# Patient Record
Sex: Male | Born: 1945
Health system: Southern US, Community
[De-identification: ages and names within clinical notes are randomized; demographics above are authoritative.]

## PROBLEM LIST (undated history)

## (undated) DIAGNOSIS — N529 Male erectile dysfunction, unspecified: Secondary | ICD-10-CM

## (undated) DIAGNOSIS — R351 Nocturia: Secondary | ICD-10-CM

## (undated) DIAGNOSIS — C61 Malignant neoplasm of prostate: Secondary | ICD-10-CM

## (undated) DIAGNOSIS — E785 Hyperlipidemia, unspecified: Secondary | ICD-10-CM

## (undated) DIAGNOSIS — D649 Anemia, unspecified: Secondary | ICD-10-CM

## (undated) DIAGNOSIS — F1020 Alcohol dependence, uncomplicated: Secondary | ICD-10-CM

## (undated) DIAGNOSIS — J45909 Unspecified asthma, uncomplicated: Secondary | ICD-10-CM

## (undated) DIAGNOSIS — M1990 Unspecified osteoarthritis, unspecified site: Secondary | ICD-10-CM

## (undated) DIAGNOSIS — N400 Enlarged prostate without lower urinary tract symptoms: Secondary | ICD-10-CM

## (undated) DIAGNOSIS — I480 Paroxysmal atrial fibrillation: Secondary | ICD-10-CM

## (undated) DIAGNOSIS — F329 Major depressive disorder, single episode, unspecified: Secondary | ICD-10-CM

## (undated) DIAGNOSIS — J449 Chronic obstructive pulmonary disease, unspecified: Secondary | ICD-10-CM

## (undated) DIAGNOSIS — F32A Depression, unspecified: Secondary | ICD-10-CM

## (undated) DIAGNOSIS — H9190 Unspecified hearing loss, unspecified ear: Secondary | ICD-10-CM

## (undated) DIAGNOSIS — I10 Essential (primary) hypertension: Secondary | ICD-10-CM

## (undated) HISTORY — DX: Malignant neoplasm of prostate: C61

## (undated) HISTORY — DX: Unspecified hearing loss, unspecified ear: H91.90

## (undated) HISTORY — DX: Chronic obstructive pulmonary disease, unspecified: J44.9

## (undated) HISTORY — DX: Hyperlipidemia, unspecified: E78.5

## (undated) HISTORY — DX: Paroxysmal atrial fibrillation: I48.0

## (undated) HISTORY — DX: Male erectile dysfunction, unspecified: N52.9

## (undated) HISTORY — DX: Major depressive disorder, single episode, unspecified: F32.9

## (undated) HISTORY — PX: TOTAL HIP ARTHROPLASTY: SHX124

## (undated) HISTORY — DX: Alcohol dependence, uncomplicated: F10.20

## (undated) HISTORY — DX: Essential (primary) hypertension: I10

## (undated) HISTORY — DX: Depression, unspecified: F32.A

---

## 2004-12-14 ENCOUNTER — Ambulatory Visit: Payer: Self-pay | Admitting: *Deleted

## 2004-12-28 ENCOUNTER — Ambulatory Visit (HOSPITAL_COMMUNITY): Admission: RE | Admit: 2004-12-28 | Discharge: 2004-12-28 | Payer: Self-pay | Admitting: *Deleted

## 2004-12-28 ENCOUNTER — Ambulatory Visit: Payer: Self-pay | Admitting: *Deleted

## 2004-12-31 HISTORY — PX: CARDIOVASCULAR STRESS TEST: SHX262

## 2005-01-07 ENCOUNTER — Ambulatory Visit: Payer: Self-pay | Admitting: *Deleted

## 2010-11-25 ENCOUNTER — Encounter: Payer: Self-pay | Admitting: *Deleted

## 2011-05-14 ENCOUNTER — Encounter: Payer: Self-pay | Admitting: Family Medicine

## 2011-05-14 DIAGNOSIS — M109 Gout, unspecified: Secondary | ICD-10-CM | POA: Insufficient documentation

## 2011-05-14 DIAGNOSIS — E785 Hyperlipidemia, unspecified: Secondary | ICD-10-CM | POA: Insufficient documentation

## 2011-05-14 DIAGNOSIS — N529 Male erectile dysfunction, unspecified: Secondary | ICD-10-CM | POA: Insufficient documentation

## 2011-05-14 DIAGNOSIS — F32A Depression, unspecified: Secondary | ICD-10-CM | POA: Insufficient documentation

## 2011-05-14 DIAGNOSIS — H9190 Unspecified hearing loss, unspecified ear: Secondary | ICD-10-CM | POA: Insufficient documentation

## 2011-05-14 DIAGNOSIS — I10 Essential (primary) hypertension: Secondary | ICD-10-CM | POA: Insufficient documentation

## 2011-05-14 DIAGNOSIS — J449 Chronic obstructive pulmonary disease, unspecified: Secondary | ICD-10-CM | POA: Insufficient documentation

## 2011-05-14 DIAGNOSIS — F329 Major depressive disorder, single episode, unspecified: Secondary | ICD-10-CM | POA: Insufficient documentation

## 2011-05-14 DIAGNOSIS — F1020 Alcohol dependence, uncomplicated: Secondary | ICD-10-CM | POA: Insufficient documentation

## 2017-08-15 ENCOUNTER — Other Ambulatory Visit: Payer: Self-pay | Admitting: Urology

## 2017-08-15 ENCOUNTER — Encounter (HOSPITAL_BASED_OUTPATIENT_CLINIC_OR_DEPARTMENT_OTHER): Payer: Self-pay | Admitting: *Deleted

## 2017-08-25 ENCOUNTER — Encounter (HOSPITAL_BASED_OUTPATIENT_CLINIC_OR_DEPARTMENT_OTHER): Admission: RE | Payer: Self-pay | Source: Ambulatory Visit

## 2017-08-25 ENCOUNTER — Ambulatory Visit (HOSPITAL_BASED_OUTPATIENT_CLINIC_OR_DEPARTMENT_OTHER): Admission: RE | Admit: 2017-08-25 | Payer: Medicare HMO | Source: Ambulatory Visit | Admitting: Urology

## 2017-08-25 HISTORY — DX: Benign prostatic hyperplasia without lower urinary tract symptoms: N40.0

## 2017-08-25 HISTORY — DX: Nocturia: R35.1

## 2017-08-25 SURGERY — CYSTOSCOPY WITH INSERTION OF UROLIFT
Anesthesia: Monitor Anesthesia Care

## 2017-11-04 HISTORY — PX: PROSTATECTOMY: SHX69

## 2018-05-27 DIAGNOSIS — R972 Elevated prostate specific antigen [PSA]: Secondary | ICD-10-CM | POA: Insufficient documentation

## 2018-09-23 DIAGNOSIS — C61 Malignant neoplasm of prostate: Secondary | ICD-10-CM | POA: Insufficient documentation

## 2019-01-19 ENCOUNTER — Ambulatory Visit: Payer: Medicare HMO | Admitting: Orthopaedic Surgery

## 2019-01-19 ENCOUNTER — Encounter: Payer: Self-pay | Admitting: Orthopaedic Surgery

## 2019-01-19 ENCOUNTER — Ambulatory Visit (INDEPENDENT_AMBULATORY_CARE_PROVIDER_SITE_OTHER): Payer: Medicare HMO

## 2019-01-19 ENCOUNTER — Other Ambulatory Visit: Payer: Self-pay

## 2019-01-19 VITALS — BP 160/90 | HR 94 | Ht 70.0 in | Wt 139.0 lb

## 2019-01-19 DIAGNOSIS — F17210 Nicotine dependence, cigarettes, uncomplicated: Secondary | ICD-10-CM | POA: Diagnosis not present

## 2019-01-19 DIAGNOSIS — M25552 Pain in left hip: Secondary | ICD-10-CM

## 2019-01-19 NOTE — Patient Instructions (Signed)
Steps to Quit Smoking    Smoking tobacco can be bad for your health. It can also affect almost every organ in your body. Smoking puts you and people around you at risk for many serious long-lasting (chronic) diseases. Quitting smoking is hard, but it is one of the best things that you can do for your health. It is never too late to quit.  What are the benefits of quitting smoking?  When you quit smoking, you lower your risk for getting serious diseases and conditions. They can include:  · Lung cancer or lung disease.  · Heart disease.  · Stroke.  · Heart attack.  · Not being able to have children (infertility).  · Weak bones (osteoporosis) and broken bones (fractures).  If you have coughing, wheezing, and shortness of breath, those symptoms may get better when you quit. You may also get sick less often. If you are pregnant, quitting smoking can help to lower your chances of having a baby of low birth weight.  What can I do to help me quit smoking?  Talk with your doctor about what can help you quit smoking. Some things you can do (strategies) include:  · Quitting smoking totally, instead of slowly cutting back how much you smoke over a period of time.  · Going to in-person counseling. You are more likely to quit if you go to many counseling sessions.  · Using resources and support systems, such as:  ? Online chats with a counselor.  ? Phone quitlines.  ? Printed self-help materials.  ? Support groups or group counseling.  ? Text messaging programs.  ? Mobile phone apps or applications.  · Taking medicines. Some of these medicines may have nicotine in them. If you are pregnant or breastfeeding, do not take any medicines to quit smoking unless your doctor says it is okay. Talk with your doctor about counseling or other things that can help you.  Talk with your doctor about using more than one strategy at the same time, such as taking medicines while you are also going to in-person counseling. This can help make  quitting easier.  What things can I do to make it easier to quit?  Quitting smoking might feel very hard at first, but there is a lot that you can do to make it easier. Take these steps:  · Talk to your family and friends. Ask them to support and encourage you.  · Call phone quitlines, reach out to support groups, or work with a counselor.  · Ask people who smoke to not smoke around you.  · Avoid places that make you want (trigger) to smoke, such as:  ? Bars.  ? Parties.  ? Smoke-break areas at work.  · Spend time with people who do not smoke.  · Lower the stress in your life. Stress can make you want to smoke. Try these things to help your stress:  ? Getting regular exercise.  ? Deep-breathing exercises.  ? Yoga.  ? Meditating.  ? Doing a body scan. To do this, close your eyes, focus on one area of your body at a time from head to toe, and notice which parts of your body are tense. Try to relax the muscles in those areas.  · Download or buy apps on your mobile phone or tablet that can help you stick to your quit plan. There are many free apps, such as QuitGuide from the CDC (Centers for Disease Control and Prevention). You can find more   support from smokefree.gov and other websites.  This information is not intended to replace advice given to you by your health care provider. Make sure you discuss any questions you have with your health care provider.  Document Released: 08/17/2009 Document Revised: 06/18/2016 Document Reviewed: 03/07/2015  Elsevier Interactive Patient Education © 2019 Elsevier Inc.

## 2019-01-19 NOTE — Progress Notes (Signed)
Subjective:    Patient ID: Dan [name], male    DOB: 08/13/1946, 73 y.o.   MRN: [id number]  HPI He fell in his yard on March 13 and hurt his left hip.  He has a bipolar hip done in 1992 by Dr. Alphonzo Cruise.  He has had no problem with the hip over the years.  His left hip is better but still hurts.  He uses a cane.  He has taken Tylenol.  He is concerned he may have injured the hip.     Review of Systems  Constitutional: Positive for activity change.  HENT: Positive for hearing loss.   Respiratory: Positive for shortness of breath. Negative for cough.   Musculoskeletal: Positive for arthralgias and gait problem.  All other systems reviewed and are negative.  For Review of Systems, all other systems reviewed and are negative.  The following is a summary of the past history medically, past history surgically, known current medicines, social history and family history.  This information is gathered electronically by the computer from prior information and documentation.  I review this each visit and have found including this information at this point in the chart is beneficial and informative.   Past Medical History:  Diagnosis Date  . Alcohol dependency ([place])   . BPH (benign prostatic hyperplasia)   . COPD (chronic obstructive pulmonary disease) ([place])   . Depression   . ED (erectile dysfunction)   . Gout   . Hyperlipidemia   . Hypertension   . Loss of hearing   . Nocturia     Past Surgical History:  Procedure Laterality Date  . CARDIOVASCULAR STRESS TEST  12/31/2004   normal perfuction nuclear study w/ no evidence ischemia /  normal LV function and wall motion , ef 52%  . PROSTATECTOMY  2019  . TOTAL HIP ARTHROPLASTY      Current Outpatient Medications on File Prior to Visit  Medication Sig Dispense Refill  . albuterol (PROVENTIL HFA;VENTOLIN HFA) 108 (90 Base) MCG/ACT inhaler Inhale 2 puffs into the lungs every 6 (six) hours as needed for wheezing or shortness of breath.     . diltiazem (CARDIZEM CD) 240 MG 24 hr capsule Take 240 mg by mouth daily.      . hydrochlorothiazide (,MICROZIDE/HYDRODIURIL,) 12.5 MG capsule Take 12.5 mg by mouth daily.      Marland Kitchen albuterol (PROVENTIL) (2.5 MG/3ML) 0.083% nebulizer solution Take 2.5 mg by nebulization every 6 (six) hours as needed.      Marland Kitchen allopurinol (ZYLOPRIM) 300 MG tablet Take 300 mg by mouth daily.      Marland Kitchen buPROPion (WELLBUTRIN XL) 150 MG 24 hr tablet Take 150 mg by mouth daily.      . diclofenac-misoprostol (ARTHROTEC 75) 75-0.2 MG per tablet Take 1 tablet by mouth 2 (two) times daily.      . montelukast (SINGULAIR) 10 MG tablet Take 10 mg by mouth at bedtime.      Marland Kitchen omega-3 acid ethyl esters (LOVAZA) 1 G capsule Take 2 g by mouth 2 (two) times daily.      Marland Kitchen omeprazole (PRILOSEC) 40 MG capsule Take 40 mg by mouth daily.       No current facility-administered medications on file prior to visit.     Social History   Socioeconomic History  . Marital status: Single    Spouse name: Not on file  . Number of children: Not on file  . Years of education: Not on file  . Highest education level:  Not on file  Occupational History  . Not on file  Social Needs  . Financial resource strain: Not on file  . Food insecurity:    Worry: Not on file    Inability: Not on file  . Transportation needs:    Medical: Not on file    Non-medical: Not on file  Tobacco Use  . Smoking status: Current Every Day Smoker  . Smokeless tobacco: Never Used  Substance and Sexual Activity  . Alcohol use: Not on file  . Drug use: Not on file  . Sexual activity: Not on file  Lifestyle  . Physical activity:    Days per week: Not on file    Minutes per session: Not on file  . Stress: Not on file  Relationships  . Social connections:    Talks on phone: Not on file    Gets together: Not on file    Attends religious service: Not on file    Active member of club or organization: Not on file    Attends meetings of clubs or organizations: Not on  file    Relationship status: Not on file  . Intimate partner violence:    Fear of current or ex partner: Not on file    Emotionally abused: Not on file    Physically abused: Not on file    Forced sexual activity: Not on file  Other Topics Concern  . Not on file  Social History Narrative  . Not on file    Family History  Problem Relation Age of Onset  . Hypertension Mother   . Cancer Father        lung    BP (!) 160/90   Pulse 94   Ht 5\' 10"  (1.778 m)   Wt 139 lb (63 kg)   BMI 19.94 kg/m   Body mass index is 19.94 kg/m.     Objective:   Physical Exam Constitutional:      Appearance: He is well-developed.  HENT:     Head: Normocephalic and atraumatic.  Eyes:     Conjunctiva/sclera: Conjunctivae normal.     Pupils: Pupils are equal, round, and reactive to light.  Neck:     Musculoskeletal: Normal range of motion and neck supple.  Cardiovascular:     Rate and Rhythm: Normal rate and regular rhythm.  Pulmonary:     Effort: Pulmonary effort is normal.  Abdominal:     Palpations: Abdomen is soft.  Musculoskeletal:     Left hip: He exhibits decreased range of motion and tenderness.       Legs:  Skin:    General: Skin is warm and dry.  Neurological:     Mental Status: He is alert and oriented to person, place, and time.     Cranial Nerves: No cranial nerve deficit.     Motor: No abnormal muscle tone.     Coordination: Coordination normal.     Deep Tendon Reflexes: Reflexes are normal and symmetric. Reflexes normal.  Psychiatric:        Behavior: Behavior normal.        Thought Content: Thought content normal.        Judgment: Judgment normal.     X-rays were done of the left hip, reported separately.  Bipolar hip left no new fracture.      Assessment & Plan:   Encounter Diagnoses  Name Primary?  . Pain of left hip joint Yes  . Cigarette nicotine dependence without complication  He has no fracture of the hip or around the prosthesis.  I have  recommended Aleve one bid, ice or heat as needed.  Return as needed.  If it gets worse, call.  Electronically Signed Sanjuana Kava, MD 3/17/202010:31 AM

## 2019-07-02 DIAGNOSIS — F17210 Nicotine dependence, cigarettes, uncomplicated: Secondary | ICD-10-CM | POA: Insufficient documentation

## 2019-09-20 DIAGNOSIS — N3090 Cystitis, unspecified without hematuria: Secondary | ICD-10-CM | POA: Insufficient documentation

## 2019-12-15 DIAGNOSIS — I739 Peripheral vascular disease, unspecified: Secondary | ICD-10-CM | POA: Insufficient documentation

## 2019-12-15 DIAGNOSIS — R002 Palpitations: Secondary | ICD-10-CM | POA: Insufficient documentation

## 2019-12-15 DIAGNOSIS — I4891 Unspecified atrial fibrillation: Secondary | ICD-10-CM | POA: Insufficient documentation

## 2020-01-04 ENCOUNTER — Other Ambulatory Visit: Payer: Self-pay

## 2020-01-04 ENCOUNTER — Encounter: Payer: Self-pay | Admitting: *Deleted

## 2020-01-04 ENCOUNTER — Encounter: Payer: Self-pay | Admitting: Cardiovascular Disease

## 2020-01-04 ENCOUNTER — Ambulatory Visit: Payer: Medicare HMO | Admitting: Cardiovascular Disease

## 2020-01-04 VITALS — BP 177/90 | HR 92 | Ht 69.0 in | Wt 133.2 lb

## 2020-01-04 DIAGNOSIS — R0600 Dyspnea, unspecified: Secondary | ICD-10-CM

## 2020-01-04 DIAGNOSIS — R5383 Other fatigue: Secondary | ICD-10-CM | POA: Diagnosis not present

## 2020-01-04 DIAGNOSIS — I10 Essential (primary) hypertension: Secondary | ICD-10-CM

## 2020-01-04 DIAGNOSIS — Z01812 Encounter for preprocedural laboratory examination: Secondary | ICD-10-CM

## 2020-01-04 DIAGNOSIS — I499 Cardiac arrhythmia, unspecified: Secondary | ICD-10-CM

## 2020-01-04 DIAGNOSIS — R0609 Other forms of dyspnea: Secondary | ICD-10-CM

## 2020-01-04 DIAGNOSIS — Z720 Tobacco use: Secondary | ICD-10-CM | POA: Diagnosis not present

## 2020-01-04 DIAGNOSIS — R002 Palpitations: Secondary | ICD-10-CM

## 2020-01-04 MED ORDER — METOPROLOL TARTRATE 100 MG PO TABS: 100.0000 mg | ORAL_TABLET | Freq: Once | ORAL | 0 refills | Status: DC

## 2020-01-04 NOTE — Addendum Note (Signed)
Addended by: Laurine Blazer on: 01/04/2020 11:26 AM   Modules accepted: Orders

## 2020-01-04 NOTE — Patient Instructions (Addendum)
Medication Instructions:  Continue all current medications.  Labwork:  BMET - order given today.   Please do just prior to CT.  Testing/Procedures:  Your physician has recommended that you wear a 30 day event monitor. Event monitors are medical devices that record the heart's electrical activity. Doctors most often [place] these monitors to diagnose arrhythmias. Arrhythmias are problems with the speed or rhythm of the heartbeat. The monitor is a small, portable device. You can wear one while you do your normal daily activities. This is usually used to diagnose what is causing palpitations/syncope (passing out).  Your physician has requested that you have an echocardiogram. Echocardiography is a painless test that uses sound waves to create images of your heart. It provides your doctor with information about the size and shape of your heart and how well your heart's chambers and valves are working. This procedure takes approximately one hour. There are no restrictions for this procedure.  Your physician has requested that you have cardiac CT. Cardiac computed tomography (CT) is a painless test that uses an x-ray machine to take clear, detailed pictures of your heart. For further information please visit HugeFiesta.tn. Please follow instruction sheet as given.  Office will contact with results via phone or letter.    Follow-Up: 3 months   Any Other Special Instructions Will Be Listed Below (If Applicable).  If you need a refill on your cardiac medications before your next appointment, please call your pharmacy.

## 2020-01-04 NOTE — Progress Notes (Addendum)
CARDIOLOGY CONSULT NOTE  Patient ID: Dan [name] MRN: [id number] DOB/AGE: 12-23-1945 74 y.o.  Admit date: (Not on file) Primary Physician: Caryl Bis, MD  Reason for Consultation: Atrial fibrillation  HPI: Dan [name] is a 74 y.o. male who is being seen today for the evaluation of atrial fibrillation at the request of Caryl Bis, MD.   I reviewed notes from his PCP.  I personally reviewed an ECG performed on 12/15/2019.  There was a lot of baseline artifact.  I was able to appreciate some sinus beats along with PVCs.  There appeared to be some PACs as well.  I reviewed labs dated 10/19/2019: Hemoglobin 11.6, platelets 373.  PCP notes mention a history of COPD and palpitations which appear to be chronic.  He has no history of chest pain.  I personally reviewed the ECG performed the office today which demonstrates sinus rhythm with frequent PACs and brief atrial runs.  Upon speaking with him, he tells me that he has had palpitations for about 3 to 4 months.  He has also been experiencing some exertional fatigue over the last 3 to 4 months.  He denies exertional chest pain.  After carrying groceries up the stairs to his house he has to sit down because he feels tired out.  He denies leg swelling, orthopnea, and syncope.  Palpitations have "leveled out "according to him since he started diltiazem.  He has been on diltiazem and Eliquis since his last office visit with his PCP.  He said he snores but does not have a known history of sleep apnea.  He wakes up with a dry mouth.  He denies daytime somnolence.  Family history: 1 brother underwent CABG.  Another brother has coronary artery stents.   Allergies  Allergen Reactions  . Tamsulosin Hives and Itching    Current Outpatient Medications  Medication Sig Dispense Refill  . ADVAIR DISKUS 250-50 MCG/DOSE AEPB Inhale 1 puff into the lungs daily.    Marland Kitchen albuterol (PROVENTIL HFA;VENTOLIN HFA) 108 (90 Base) MCG/ACT  inhaler Inhale 2 puffs into the lungs every 6 (six) hours as needed for wheezing or shortness of breath.    Marland Kitchen albuterol (PROVENTIL) (2.5 MG/3ML) 0.083% nebulizer solution Take 2.5 mg by nebulization every 6 (six) hours as needed.      . colchicine 0.6 MG tablet Take 1 tablet by mouth daily as needed.    . diltiazem (TIAZAC) 360 MG 24 hr capsule Take 360 mg by mouth daily.    Marland Kitchen ELIQUIS 5 MG TABS tablet Take 5 mg by mouth 2 (two) times daily.    . hydrochlorothiazide (,MICROZIDE/HYDRODIURIL,) 12.5 MG capsule Take 12.5 mg by mouth daily.       No current facility-administered medications for this visit.    Past Medical History:  Diagnosis Date  . Alcohol dependency ([place])   . BPH (benign prostatic hyperplasia)   . COPD (chronic obstructive pulmonary disease) ([place])   . Depression   . ED (erectile dysfunction)   . Gout   . Hyperlipidemia   . Hypertension   . Loss of hearing   . Nocturia     Past Surgical History:  Procedure Laterality Date  . CARDIOVASCULAR STRESS TEST  12/31/2004   normal perfuction nuclear study w/ no evidence ischemia /  normal LV function and wall motion , ef 52%  . PROSTATECTOMY  2019  . TOTAL HIP ARTHROPLASTY      Social History   Socioeconomic History  .  Marital status: Single    Spouse name: Not on file  . Number of children: Not on file  . Years of education: Not on file  . Highest education level: Not on file  Occupational History  . Not on file  Tobacco Use  . Smoking status: Current Every Day Smoker    Types: Cigarettes    Start date: 06/12/1967  . Smokeless tobacco: Never Used  Substance and Sexual Activity  . Alcohol use: Not on file  . Drug use: Not on file  . Sexual activity: Not on file  Other Topics Concern  . Not on file  Social History Narrative  . Not on file   Social Determinants of Health   Financial Resource Strain:   . Difficulty of Paying Living Expenses: Not on file  Food Insecurity:   . Worried About Charity fundraiser  in the Last Year: Not on file  . Ran Out of Food in the Last Year: Not on file  Transportation Needs:   . Lack of Transportation (Medical): Not on file  . Lack of Transportation (Non-Medical): Not on file  Physical Activity:   . Days of Exercise per Week: Not on file  . Minutes of Exercise per Session: Not on file  Stress:   . Feeling of Stress : Not on file  Social Connections:   . Frequency of Communication with Friends and Family: Not on file  . Frequency of Social Gatherings with Friends and Family: Not on file  . Attends Religious Services: Not on file  . Active Member of Clubs or Organizations: Not on file  . Attends Archivist Meetings: Not on file  . Marital Status: Not on file  Intimate Partner Violence:   . Fear of Current or Ex-Partner: Not on file  . Emotionally Abused: Not on file  . Physically Abused: Not on file  . Sexually Abused: Not on file      Current Meds  Medication Sig  . ADVAIR DISKUS 250-50 MCG/DOSE AEPB Inhale 1 puff into the lungs daily.  Marland Kitchen albuterol (PROVENTIL HFA;VENTOLIN HFA) 108 (90 Base) MCG/ACT inhaler Inhale 2 puffs into the lungs every 6 (six) hours as needed for wheezing or shortness of breath.  Marland Kitchen albuterol (PROVENTIL) (2.5 MG/3ML) 0.083% nebulizer solution Take 2.5 mg by nebulization every 6 (six) hours as needed.    . colchicine 0.6 MG tablet Take 1 tablet by mouth daily as needed.  . diltiazem (TIAZAC) 360 MG 24 hr capsule Take 360 mg by mouth daily.  Marland Kitchen ELIQUIS 5 MG TABS tablet Take 5 mg by mouth 2 (two) times daily.  . hydrochlorothiazide (,MICROZIDE/HYDRODIURIL,) 12.5 MG capsule Take 12.5 mg by mouth daily.        Review of systems complete and found to be negative unless listed above in HPI    Physical exam Blood pressure (!) 177/90, pulse 92, height 5\' 9"  (1.753 m), weight 133 lb 3.2 oz (60.4 kg), SpO2 99 %. General: NAD Neck: No JVD, no thyromegaly or thyroid nodule.  Lungs: Diffusely diminished breath sounds, no  crackles or wheezes. CV: Nondisplaced PMI. Regular rate and irregular rhythm with frequent premature contractions, normal S1/S2, no S3/S4, no murmur.  No peripheral edema.  No carotid bruit.    Abdomen: Soft, nontender, no distention.  Skin: Intact without lesions or rashes.  Neurologic: Alert and oriented x 3.  Psych: Normal affect. Extremities: No clubbing or cyanosis.  HEENT: Normal.   ECG: Most recent ECG reviewed.   Labs:  No results found for: K, BUN, CREATININE, ALT, TSH, HGB   Lipids: No results found for: LDLCALC, LDLDIRECT, CHOL, TRIG, HDL      ASSESSMENT AND PLAN:   1.  Arrhythmia/palpitations: ECG from PCPs office has a lot of baseline artifact but appears to demonstrate sinus rhythm with PACs and PVCs.  I cannot definitively say there is evidence of atrial fibrillation.  Today's ECG also shows sinus rhythm with frequent PACs and brief atrial runs but again, no definitive evidence of atrial fibrillation.  He is currently on extended release diltiazem 360 mg daily along with Eliquis 5 mg twice daily as prescribed by his PCP.  I will not stop apixaban until I have ruled out atrial fibrillation.  He is certainly at risk for given his atrial ectopy burden. In order to clarify whether or not he has atrial fibrillation, I will obtain a 30-day event monitor.  I will also obtain an echocardiogram to evaluate cardiac structure and function.  2.  Hypertension: Blood pressure is significantly elevated.  He said while it stays high at times, he was very anxious about today's visit.  This will need further monitoring.  3.  Exertional fatigue: He has a family history of heart disease.  He has a long history of tobacco use.  I will proceed with coronary CT angiography.  4.  Tobacco use: He smokes about 5 cigarettes daily.  He smoked up to 2 packs daily in his 82s and 67s.  We talked about cessation.     Disposition: Follow up in 3 months  Signed: Kate Sable, M.D.,  F.A.C.C.  01/04/2020, 10:51 AM

## 2020-01-05 ENCOUNTER — Other Ambulatory Visit: Payer: Self-pay | Admitting: Cardiovascular Disease

## 2020-01-05 DIAGNOSIS — I499 Cardiac arrhythmia, unspecified: Secondary | ICD-10-CM

## 2020-01-17 ENCOUNTER — Ambulatory Visit (INDEPENDENT_AMBULATORY_CARE_PROVIDER_SITE_OTHER): Payer: Medicare HMO

## 2020-01-17 DIAGNOSIS — R002 Palpitations: Secondary | ICD-10-CM

## 2020-01-24 LAB — BASIC METABOLIC PANEL
BUN: 24 mg/dL (ref 7–25)
CO2: 28 mmol/L (ref 20–32)
Calcium: 9.3 mg/dL (ref 8.6–10.3)
Chloride: 94 mmol/L — ABNORMAL LOW (ref 98–110)
Creat: 1.11 mg/dL (ref 0.70–1.18)
Glucose, Bld: 112 mg/dL (ref 65–139)
Potassium: 3.2 mmol/L — ABNORMAL LOW (ref 3.5–5.3)
Sodium: 135 mmol/L (ref 135–146)

## 2020-01-25 ENCOUNTER — Telehealth: Payer: Self-pay | Admitting: *Deleted

## 2020-01-25 DIAGNOSIS — E876 Hypokalemia: Secondary | ICD-10-CM

## 2020-01-25 DIAGNOSIS — R002 Palpitations: Secondary | ICD-10-CM

## 2020-01-25 MED ORDER — POTASSIUM CHLORIDE ER 10 MEQ PO CPCR: 20.0000 meq | ORAL_CAPSULE | Freq: Every day | ORAL | 6 refills | Status: DC

## 2020-01-25 NOTE — Telephone Encounter (Signed)
-----   Message from Herminio Commons, MD sent at 01/24/2020  3:52 PM EDT ----- Potassium is low which can exacerbate palpitations.  He is on hydrochlorothiazide which is likely the cause of this.  Start potassium chloride 20 mEq daily and repeat a basic metabolic panel 1 week after starting supplemental potassium.

## 2020-01-25 NOTE — Telephone Encounter (Signed)
Dan [name], [place]  [id number] PM EDT    Patient & wife notified. New prescription sent to [facility]. He will repeat BMET on 02/02/2020 or 02/03/2020 at [place] across from [facility]    Will send in 52meq capsules at wife request as he has difficulty swallowing large pills.

## 2020-01-27 ENCOUNTER — Ambulatory Visit (INDEPENDENT_AMBULATORY_CARE_PROVIDER_SITE_OTHER): Payer: Medicare HMO

## 2020-01-27 ENCOUNTER — Other Ambulatory Visit: Payer: Self-pay

## 2020-01-27 DIAGNOSIS — I499 Cardiac arrhythmia, unspecified: Secondary | ICD-10-CM

## 2020-01-27 DIAGNOSIS — I10 Essential (primary) hypertension: Secondary | ICD-10-CM

## 2020-01-31 ENCOUNTER — Telehealth: Payer: Self-pay | Admitting: *Deleted

## 2020-01-31 NOTE — Telephone Encounter (Signed)
-----   Message from Herminio Commons, MD sent at 01/27/2020  4:13 PM EDT ----- Normal pumping function.  Mild to moderate mitral valve leakage.  I will monitor.

## 2020-01-31 NOTE — Telephone Encounter (Signed)
The patient verbally consented for a telehealth phone visit with [facility] and understands that his/her insurance company will be billed for the encounter.  

## 2020-01-31 NOTE — Telephone Encounter (Signed)
Laurine Blazer, [place]  [id number] PM EDT    Patient & wife notified. Copy to pmd. Follow up scheduled for 05/02/2020 - vv with Dr. Bronson Ing.

## 2020-02-05 ENCOUNTER — Telehealth: Payer: Self-pay | Admitting: Internal Medicine

## 2020-02-05 NOTE — Telephone Encounter (Signed)
Received a call from [place] for a patient triggered event. Rhythm was sinus with a rate of ~80bpm. [employer] tried to reach the patient and was unsuccessful. I have also tried to call the patient multiple times but have been unable to get a hold of him and his voicemail box is not set up. Will update Dr. Bronson Ing.   Alric Quan, MD Cardiology [place]

## 2020-02-08 ENCOUNTER — Telehealth (HOSPITAL_COMMUNITY): Payer: Self-pay | Admitting: Emergency Medicine

## 2020-02-08 NOTE — Telephone Encounter (Addendum)
Call placed to patient to follow up on [employer] monitor.  Patient stated that the patch that was in the center of his chest was not sticking - could not get it to stay on.  He called [employer] & they mailed him some different patches & monitoring is going good now.  No c/o syncope at any time.

## 2020-02-08 NOTE — Telephone Encounter (Signed)
Attempted to call patient regarding upcoming cardiac CT appointment. °Left message on voicemail with name and callback number °Catelyn Friel RN Navigator Cardiac Imaging °[facility] °[phone] Office °[phone] Cell ° °

## 2020-02-09 ENCOUNTER — Telehealth: Payer: Self-pay | Admitting: Cardiovascular Disease

## 2020-02-09 ENCOUNTER — Ambulatory Visit (HOSPITAL_COMMUNITY)
Admission: RE | Admit: 2020-02-09 | Discharge: 2020-02-09 | Disposition: A | Discharge status: Home / Self Care | Payer: Medicare HMO | Source: Ambulatory Visit | Attending: Cardiovascular Disease | Admitting: Cardiovascular Disease

## 2020-02-09 ENCOUNTER — Other Ambulatory Visit: Payer: Self-pay

## 2020-02-09 ENCOUNTER — Encounter: Payer: Self-pay | Admitting: *Deleted

## 2020-02-09 ENCOUNTER — Encounter (HOSPITAL_COMMUNITY): Payer: Self-pay

## 2020-02-09 DIAGNOSIS — R5383 Other fatigue: Secondary | ICD-10-CM

## 2020-02-09 DIAGNOSIS — R0600 Dyspnea, unspecified: Secondary | ICD-10-CM

## 2020-02-09 DIAGNOSIS — R0609 Other forms of dyspnea: Secondary | ICD-10-CM

## 2020-02-09 NOTE — Telephone Encounter (Signed)
  Precert needed for: Lexiscan   Location: [facility]    Date: February 17, 2020

## 2020-02-09 NOTE — Progress Notes (Signed)
Dan [name] presents for CT heart study as ordered. He currently is in A.Fib with rates ranging from 88-96 bpm after 100 mg of Metoprolol and 360 mg of Cardizem. He reports shortness of breath with ambulation. Vitals are stable at this time. CT tech notified of rate and will contact Cardiology.

## 2020-02-09 NOTE — Telephone Encounter (Signed)
I sent a message to Dan [name] about ordering a Lexiscan.

## 2020-02-09 NOTE — Telephone Encounter (Signed)
Patient's wife called. Patient went to [facility] to have test done and was told that he was in AFIB and they could not perform the test and to contact Dr Bronson Ing

## 2020-02-09 NOTE — Telephone Encounter (Signed)
Patient informed and verbalized understanding. Lexiscan instructions read and mailed to patient.

## 2020-02-10 ENCOUNTER — Telehealth: Payer: Self-pay | Admitting: Cardiovascular Disease

## 2020-02-10 NOTE — Telephone Encounter (Signed)
I agree as that is the whole purpose of the stress test.  Please reassure him that this is a very common cardiac investigation.

## 2020-02-10 NOTE — Telephone Encounter (Signed)
Spoke with patient who says he doesn't think he can have the stress test because of his SOB. Advised that because his cardiac ct was canceled d/t A-fib, the stress test was ordered to evaluate his heart while under stress to see if his DOE is caused by his heart. Patient says he is concerned that he may have a heart attack during the stress test. Advised that he will be monitored closely during the stress test for any issues that may arise. Advised that his provider would be contacted about his concerns.

## 2020-02-10 NOTE — Telephone Encounter (Signed)
Patient's wife called stating that patient will not be able to do a stress test due to his shortness of breath.

## 2020-02-17 ENCOUNTER — Encounter (HOSPITAL_COMMUNITY): Payer: Medicare HMO

## 2020-02-17 ENCOUNTER — Telehealth: Payer: Self-pay | Admitting: Cardiovascular Disease

## 2020-02-17 ENCOUNTER — Ambulatory Visit (HOSPITAL_COMMUNITY): Payer: Medicare HMO

## 2020-02-17 NOTE — Telephone Encounter (Signed)
Patient's wife called asking for someone to call them back about his SOB.  He lexiscan had to be rescheduled due to their insurance not authorizing.   [employer] is working on trying to get authorized.

## 2020-02-17 NOTE — Telephone Encounter (Signed)
Spoke with wife who says patient continues to be out of breath after walking to the mail box. Advised wife that if SOB has worsened that he can go to the ED for an evaluation. Verbalized understanding of plan.

## 2020-02-18 ENCOUNTER — Telehealth: Payer: Self-pay | Admitting: Cardiovascular Disease

## 2020-02-18 NOTE — Telephone Encounter (Signed)
Fwd to provider for notification.  

## 2020-02-18 NOTE — Telephone Encounter (Signed)
For "DOE"?

## 2020-02-18 NOTE — Telephone Encounter (Signed)
New Message    Anderson Malta from [employer] is calling to inform the stress test is denied   Please advise

## 2020-02-22 NOTE — Telephone Encounter (Signed)
Pt wife called back in regards to stress test and aware that insurance denied coverage - also asking for results of 30 day monitor and aware that EOS was not available and would forward to covering nurse for f/u on monitor results

## 2020-02-23 ENCOUNTER — Telehealth: Payer: Self-pay | Admitting: Cardiovascular Disease

## 2020-02-23 NOTE — Telephone Encounter (Signed)
   Went to pt's chart to check note. A rep from [employer] is calling about appeal for stress test, transferred call to [facility]

## 2020-02-28 ENCOUNTER — Telehealth: Payer: Self-pay | Admitting: Cardiovascular Disease

## 2020-02-28 NOTE — Telephone Encounter (Signed)
Patient called stating that he has not received instructions on his upcoming stress test. Please call [phone].

## 2020-02-28 NOTE — Telephone Encounter (Signed)
Advised that stress test instructions were mailed on 02/09/2020. Advised to come by office to pick up copy. Verbalized understanding.

## 2020-03-01 ENCOUNTER — Ambulatory Visit (HOSPITAL_COMMUNITY)
Admission: RE | Admit: 2020-03-01 | Discharge: 2020-03-01 | Disposition: A | Discharge status: Home / Self Care | Payer: Medicare HMO | Source: Ambulatory Visit | Attending: Cardiovascular Disease | Admitting: Cardiovascular Disease

## 2020-03-01 ENCOUNTER — Encounter (HOSPITAL_COMMUNITY)
Admission: RE | Admit: 2020-03-01 | Discharge: 2020-03-01 | Disposition: A | Discharge status: Home / Self Care | Payer: Medicare HMO | Source: Ambulatory Visit | Attending: Cardiovascular Disease | Admitting: Cardiovascular Disease

## 2020-03-01 ENCOUNTER — Other Ambulatory Visit: Payer: Self-pay

## 2020-03-01 DIAGNOSIS — R0609 Other forms of dyspnea: Secondary | ICD-10-CM

## 2020-03-01 DIAGNOSIS — R0600 Dyspnea, unspecified: Secondary | ICD-10-CM | POA: Diagnosis present

## 2020-03-01 LAB — NM MYOCAR MULTI W/SPECT W/WALL MOTION / EF
LV dias vol: 69 mL (ref 62–150)
LV sys vol: 37 mL
Peak HR: 122 {beats}/min
RATE: 0.29
Rest HR: 103 {beats}/min
SDS: 1
SRS: 4
SSS: 5
TID: 1.09

## 2020-03-01 MED ORDER — SODIUM CHLORIDE FLUSH 0.9 % IV SOLN
INTRAVENOUS | Status: AC
  Administered 2020-03-01: 10 mL via INTRAVENOUS
  Filled 2020-03-01: qty 10

## 2020-03-01 MED ORDER — TECHNETIUM TC 99M TETROFOSMIN IV KIT
30.0000 | PACK | Freq: Once | INTRAVENOUS | Status: AC | PRN
  Administered 2020-03-01: 12:00:00 32 via INTRAVENOUS

## 2020-03-01 MED ORDER — TECHNETIUM TC 99M TETROFOSMIN IV KIT
10.0000 | PACK | Freq: Once | INTRAVENOUS | Status: AC | PRN
  Administered 2020-03-01: 10.4 via INTRAVENOUS

## 2020-03-01 MED ORDER — REGADENOSON 0.4 MG/5ML IV SOLN
INTRAVENOUS | Status: AC
  Administered 2020-03-01: 11:00:00 0.4 mg via INTRAVENOUS
  Filled 2020-03-01: qty 5

## 2020-03-06 ENCOUNTER — Telehealth: Payer: Self-pay | Admitting: Cardiovascular Disease

## 2020-03-06 DIAGNOSIS — R002 Palpitations: Secondary | ICD-10-CM

## 2020-03-06 NOTE — Telephone Encounter (Signed)
Gina-wife called stating that she thinks patient is taking too much Eliquis.States that his arm is covered with blood spots.  [phone].

## 2020-03-06 NOTE — Telephone Encounter (Signed)
Pt wife voiced understanding - they will be back in town on Friday and will have labs done at Dr Olena Heckle office - lab orders mailed to pt as requested

## 2020-03-06 NOTE — Telephone Encounter (Signed)
Can check a CBC.  Decreasing the dosage will fail to prevent a stroke.

## 2020-03-06 NOTE — Telephone Encounter (Signed)
Pt wife says since starting Eliquis last month noticing increased bruising on arms and legs and leg weakness - also c/o face remaining pale looking - denies any bleeding - pt wife wanted to know if he could decrease to 2.5 mg bid

## 2020-03-09 ENCOUNTER — Telehealth: Payer: Self-pay | Admitting: *Deleted

## 2020-03-09 DIAGNOSIS — I5032 Chronic diastolic (congestive) heart failure: Secondary | ICD-10-CM | POA: Insufficient documentation

## 2020-03-09 NOTE — Telephone Encounter (Signed)
-----   Message from Herminio Commons, MD sent at 03/03/2020  3:21 PM EDT ----- Sinus rhythm with frequent PACs and rare PVCs with paroxysms of possible atrial fibrillation

## 2020-03-09 NOTE — Telephone Encounter (Signed)
Dan [name], [place]  [id number] 624THL PM EDT    Left message to return call.

## 2020-03-10 ENCOUNTER — Other Ambulatory Visit: Payer: Self-pay | Admitting: *Deleted

## 2020-03-10 NOTE — Telephone Encounter (Signed)
Dan [name], [place]  [id number] 624THL PM EDT    Wife Barnett Applebaum) notified. Copy to pcp.

## 2020-03-14 ENCOUNTER — Telehealth: Payer: Self-pay | Admitting: *Deleted

## 2020-03-14 ENCOUNTER — Ambulatory Visit (INDEPENDENT_AMBULATORY_CARE_PROVIDER_SITE_OTHER): Payer: Medicare HMO | Admitting: Orthopaedic Surgery

## 2020-03-14 ENCOUNTER — Ambulatory Visit: Payer: Medicare HMO

## 2020-03-14 ENCOUNTER — Encounter: Payer: Self-pay | Admitting: Orthopaedic Surgery

## 2020-03-14 ENCOUNTER — Other Ambulatory Visit: Payer: Self-pay

## 2020-03-14 VITALS — BP 151/79 | HR 101 | Ht 69.0 in | Wt 133.0 lb

## 2020-03-14 DIAGNOSIS — M25552 Pain in left hip: Secondary | ICD-10-CM

## 2020-03-14 DIAGNOSIS — W19XXXA Unspecified fall, initial encounter: Secondary | ICD-10-CM | POA: Diagnosis not present

## 2020-03-14 DIAGNOSIS — F17210 Nicotine dependence, cigarettes, uncomplicated: Secondary | ICD-10-CM | POA: Diagnosis not present

## 2020-03-14 DIAGNOSIS — M9702XA Periprosthetic fracture around internal prosthetic left hip joint, initial encounter: Secondary | ICD-10-CM

## 2020-03-14 MED ORDER — RIVAROXABAN 20 MG PO TABS: 20.0000 mg | ORAL_TABLET | Freq: Every day | ORAL | Status: DC

## 2020-03-14 MED ORDER — HYDROCODONE-ACETAMINOPHEN 5-325 MG PO TABS: ORAL_TABLET | ORAL | 0 refills | Status: DC

## 2020-03-14 NOTE — Telephone Encounter (Signed)
Laurine Blazer, [place]  [id number] 624THL AM EDT    Patient notified.    Laurine Blazer, [place]  [id number] 624THL PM EDT    Wife Barnett Applebaum) notified. States that none of the statins were ever effective for him & caused his brother to have memory issues. States that he absolutely will not take the Crestor. Stated that he had taken Lovaza in the past and is willing to take that again if need to.   Also, pcp has changed his Eliquis to Xarelto 20mg  every evening due to swelling in legs & excessive bruising - will be doing CBC tomorrow.   Laurine Blazer, [place]  [id number]-[id number] PM EDT    03/09/2020 4:19 PM EDT Left message to return call.   Herminio Commons, MD  03/03/2020 3:14 PM EDT    There is suggestion of previous heart attack but no significant blockages. I would start rosuvastatin 20 mg daily for its pleiotropic effects. Pumping function was normal on echocardiogram which is a more accurate test for assessing this.

## 2020-03-14 NOTE — Patient Instructions (Signed)
Steps to Quit Smoking Smoking tobacco is the leading cause of preventable death. It can affect almost every organ in the body. Smoking puts you and people around you at risk for many serious, long-lasting (chronic) diseases. Quitting smoking can be hard, but it is one of the best things that you can do for your health. It is never too late to quit. How do I get ready to quit? When you decide to quit smoking, make a plan to help you succeed. Before you quit:  Pick a date to quit. Set a date within the next 2 weeks to give you time to prepare.  Write down the reasons why you are quitting. Keep this list in places where you will see it often.  Tell your family, friends, and co-workers that you are quitting. Their support is important.  Talk with your doctor about the choices that may help you quit.  Find out if your health insurance will pay for these treatments.  Know the people, places, things, and activities that make you want to smoke (triggers). Avoid them. What first steps can I take to quit smoking?  Throw away all cigarettes at home, at work, and in your car.  Throw away the things that you use when you smoke, such as ashtrays and lighters.  Clean your car. Make sure to empty the ashtray.  Clean your home, including curtains and carpets. What can I do to help me quit smoking? Talk with your doctor about taking medicines and seeing a counselor at the same time. You are more likely to succeed when you do both.  If you are pregnant or breastfeeding, talk with your doctor about counseling or other ways to quit smoking. Do not take medicine to help you quit smoking unless your doctor tells you to do so. To quit smoking: Quit right away  Quit smoking totally, instead of slowly cutting back on how much you smoke over a period of time.  Go to counseling. You are more likely to quit if you go to counseling sessions regularly. Take medicine You may take medicines to help you quit. Some  medicines need a prescription, and some you can buy over-the-counter. Some medicines may contain a drug called nicotine to replace the nicotine in cigarettes. Medicines may:  Help you to stop having the desire to smoke (cravings).  Help to stop the problems that come when you stop smoking (withdrawal symptoms). Your doctor may ask you to use:  Nicotine patches, gum, or lozenges.  Nicotine inhalers or sprays.  Non-nicotine medicine that is taken by mouth. Find resources Find resources and other ways to help you quit smoking and remain smoke-free after you quit. These resources are most helpful when you use them often. They include:  Online chats with a counselor.  Phone quitlines.  Printed self-help materials.  Support groups or group counseling.  Text messaging programs.  Mobile phone apps. Use apps on your mobile phone or tablet that can help you stick to your quit plan. There are many free apps for mobile phones and tablets as well as websites. Examples include Quit Guide from the CDC and smokefree.gov  What things can I do to make it easier to quit?   Talk to your family and friends. Ask them to support and encourage you.  Call a phone quitline (1-800-QUIT-NOW), reach out to support groups, or work with a counselor.  Ask people who smoke to not smoke around you.  Avoid places that make you want to smoke,   such as: ? Bars. ? Parties. ? Smoke-break areas at work.  Spend time with people who do not smoke.  Lower the stress in your life. Stress can make you want to smoke. Try these things to help your stress: ? Getting regular exercise. ? Doing deep-breathing exercises. ? Doing yoga. ? Meditating. ? Doing a body scan. To do this, close your eyes, focus on one area of your body at a time from head to toe. Notice which parts of your body are tense. Try to relax the muscles in those areas. How will I feel when I quit smoking? Day 1 to 3 weeks Within the first 24 hours,  you may start to have some problems that come from quitting tobacco. These problems are very bad 2-3 days after you quit, but they do not often last for more than 2-3 weeks. You may get these symptoms:  Mood swings.  Feeling restless, nervous, angry, or annoyed.  Trouble concentrating.  Dizziness.  Strong desire for high-sugar foods and nicotine.  Weight gain.  Trouble pooping (constipation).  Feeling like you may vomit (nausea).  Coughing or a sore throat.  Changes in how the medicines that you take for other issues work in your body.  Depression.  Trouble sleeping (insomnia). Week 3 and afterward After the first 2-3 weeks of quitting, you may start to notice more positive results, such as:  Better sense of smell and taste.  Less coughing and sore throat.  Slower heart rate.  Lower blood pressure.  Clearer skin.  Better breathing.  Fewer sick days. Quitting smoking can be hard. Do not give up if you fail the first time. Some people need to try a few times before they succeed. Do your best to stick to your quit plan, and talk with your doctor if you have any questions or concerns. Summary  Smoking tobacco is the leading cause of preventable death. Quitting smoking can be hard, but it is one of the best things that you can do for your health.  When you decide to quit smoking, make a plan to help you succeed.  Quit smoking right away, not slowly over a period of time.  When you start quitting, seek help from your doctor, family, or friends. This information is not intended to replace advice given to you by your health care provider. Make sure you discuss any questions you have with your health care provider. Document Revised: 07/16/2019 Document Reviewed: 01/09/2019 Elsevier Patient Education  2020 Elsevier Inc.  

## 2020-03-14 NOTE — Progress Notes (Signed)
Patient [id number] Dan [name], male DOB:[birth year], 74 y.o. [id number]  Chief Complaint  Patient presents with  . Hip Pain    left hip pain had fall on 03/03/20    HPI  Dan [name] is a 74 y.o. male who fell again April 30th and hurt his left hip again.  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 Alphonzo Cruise.  He fell last March 2020 and then did well.  He has had some bruising of the left knee area with no pain there.  He has no numbness. Pain is in the proximal thigh and mid thigh.  Nothing seems to help. He waited to come in as they had beach trip planned and non-refundable.     Body mass index is 19.64 kg/m.  ROS  Review of Systems  Constitutional: Positive for activity change.  HENT: Positive for hearing loss.   Respiratory: Positive for shortness of breath. Negative for cough.   Musculoskeletal: Positive for arthralgias and gait problem.  All other systems reviewed and are negative.   All other systems reviewed and are negative.  The following is a summary of the past history medically, past history surgically, known current medicines, social history and family history.  This information is gathered electronically by the computer from prior information and documentation.  I review this each visit and have found including this information at this point in the chart is beneficial and informative.    Past Medical History:  Diagnosis Date  . Alcohol dependency ([place])   . BPH (benign prostatic hyperplasia)   . COPD (chronic obstructive pulmonary disease) ([place])   . Depression   . ED (erectile dysfunction)   . Gout   . Hyperlipidemia   . Hypertension   . Loss of hearing   . Nocturia     Past Surgical History:  Procedure Laterality Date  . CARDIOVASCULAR STRESS TEST  12/31/2004   normal perfuction nuclear study w/ no evidence ischemia /  normal LV function and wall motion , ef 52%  . PROSTATECTOMY  2019  . TOTAL HIP ARTHROPLASTY      Family History  Problem  Relation Age of Onset  . Hypertension Mother   . Cancer Father        lung    Social History Social History   Tobacco Use  . Smoking status: Current Every Day Smoker    Types: Cigarettes    Start date: 06/12/1967  . Smokeless tobacco: Never Used  Substance Use Topics  . Alcohol use: Not on file  . Drug use: Not on file    Allergies  Allergen Reactions  . Tamsulosin Hives and Itching    Current Outpatient Medications  Medication Sig Dispense Refill  . ADVAIR DISKUS 250-50 MCG/DOSE AEPB Inhale 1 puff into the lungs daily.    Marland Kitchen albuterol (PROVENTIL HFA;VENTOLIN HFA) 108 (90 Base) MCG/ACT inhaler Inhale 2 puffs into the lungs every 6 (six) hours as needed for wheezing or shortness of breath.    Marland Kitchen albuterol (PROVENTIL) (2.5 MG/3ML) 0.083% nebulizer solution Take 2.5 mg by nebulization every 6 (six) hours as needed.      . colchicine 0.6 MG tablet Take 1 tablet by mouth daily as needed.    . diltiazem (TIAZAC) 360 MG 24 hr capsule Take 360 mg by mouth daily.    . potassium chloride (MICRO-K) 10 MEQ CR capsule Take 2 capsules (20 mEq total) by mouth daily. 60 capsule 6  . rivaroxaban (XARELTO) 20 MG  TABS tablet Take 1 tablet (20 mg total) by mouth daily with supper.    . hydrochlorothiazide (,MICROZIDE/HYDRODIURIL,) 12.5 MG capsule Take 12.5 mg by mouth daily.       No current facility-administered medications for this visit.     Physical Exam  Blood pressure (!) 151/79, pulse (!) 101, height 5\' 9"  (1.753 m), weight 133 lb (60.3 kg).  Constitutional: overall normal hygiene, normal nutrition, well developed, normal grooming, normal body habitus. Assistive device:wheelchair  Musculoskeletal: gait and station Limp Pain to stand, pain in the left hip area, muscle tone and strength are normal, no tremors or atrophy is present.  Left hip motion is good seated,more tender standing.  .  Neurological: coordination overall normal.  Deep tendon reflex/nerve stretch intact.  Sensation  normal.  Cranial nerves II-XII intact.   Skin:   Normal overall no scars, lesions, ulcers or rashes. No psoriasis.  Psychiatric: Alert and oriented x 3.  Recent memory intact, remote memory unclear.  Normal mood and affect. Well groomed.  Good eye contact.  Cardiovascular: overall no swelling, no varicosities, no edema bilaterally, normal temperatures of the legs and arms, no clubbing, cyanosis and good capillary refill.  Lymphatic: palpation is normal.  All other systems reviewed and are negative   The patient has been educated about the nature of the problem(s) and counseled on treatment options.  The patient appeared to understand what I have discussed and is in agreement with it.  Encounter Diagnoses  Name Primary?  . Pain in left hip Yes  . Periprosthetic fracture around internal prosthetic left hip joint, initial encounter ([place])   . Cigarette nicotine dependence without complication    X-rays were done of the left hip, reported separately. He has periprosthetic fracture.  He would like to be seen by Dr. Ihor Gully.  I will make calls.    I have reviewed the [facility] web site prior to prescribing narcotic medicine for this patient.   PLAN Call if any problems.  Precautions discussed.  Continue current medications. I will call in pain medicine.  I have given Rx for wheelchair.  He has walker at home.  Dr. Ihor Gully will see him Friday in the office.  Return to clinic To Dr. Ihor Gully.   Electronically Signed Sanjuana Kava, MD 5/11/20213:33 PM

## 2020-03-15 ENCOUNTER — Telehealth: Payer: Self-pay | Admitting: Orthopaedic Surgery

## 2020-03-15 NOTE — Telephone Encounter (Signed)
Called back to patient - copies of film/CD cannot be made in time from our clinic due to program issue. Advised to contact [facility] radiology for copies/CD.  Contact phone number given. Aware will need to sign release there.

## 2020-03-15 NOTE — Telephone Encounter (Signed)
Patient and designated contact wife, Barnett Applebaum, called to request copy of films/reports from patient's visit with Dr Luna Glasgow yesterday, 5/101/21, for his upcoming referral appointment with Dr Alvan Dame in [facility] Friday, 03/18/20 at 8:45am; aware if our clinic can copy the films onto CD we will complete process here; otherwise, would need to request from radiology department at [facility]. Patient aware we will call them back to notify.

## 2020-03-28 DIAGNOSIS — R5383 Other fatigue: Secondary | ICD-10-CM | POA: Insufficient documentation

## 2020-03-29 DIAGNOSIS — M353 Polymyalgia rheumatica: Secondary | ICD-10-CM | POA: Insufficient documentation

## 2020-04-20 DIAGNOSIS — Z6822 Body mass index (BMI) 22.0-22.9, adult: Secondary | ICD-10-CM | POA: Insufficient documentation

## 2020-05-02 ENCOUNTER — Telehealth: Payer: Medicare HMO | Admitting: Cardiovascular Disease

## 2020-07-18 DIAGNOSIS — Z9189 Other specified personal risk factors, not elsewhere classified: Secondary | ICD-10-CM | POA: Insufficient documentation

## 2020-08-02 DIAGNOSIS — D649 Anemia, unspecified: Secondary | ICD-10-CM | POA: Insufficient documentation

## 2020-08-14 ENCOUNTER — Other Ambulatory Visit: Payer: Self-pay | Admitting: *Deleted

## 2020-08-14 MED ORDER — POTASSIUM CHLORIDE ER 10 MEQ PO CPCR: 20.0000 meq | ORAL_CAPSULE | Freq: Every day | ORAL | 0 refills | Status: DC

## 2020-09-06 ENCOUNTER — Other Ambulatory Visit: Payer: Self-pay | Admitting: Family Medicine

## 2020-09-30 ENCOUNTER — Other Ambulatory Visit: Payer: Self-pay | Admitting: Family Medicine

## 2020-10-31 DIAGNOSIS — R0789 Other chest pain: Secondary | ICD-10-CM | POA: Insufficient documentation

## 2020-11-11 DIAGNOSIS — R69 Illness, unspecified: Secondary | ICD-10-CM | POA: Diagnosis not present

## 2020-11-11 DIAGNOSIS — D6832 Hemorrhagic disorder due to extrinsic circulating anticoagulants: Secondary | ICD-10-CM | POA: Diagnosis not present

## 2020-11-11 DIAGNOSIS — I708 Atherosclerosis of other arteries: Secondary | ICD-10-CM | POA: Diagnosis not present

## 2020-11-11 DIAGNOSIS — Z7901 Long term (current) use of anticoagulants: Secondary | ICD-10-CM | POA: Diagnosis not present

## 2020-11-11 DIAGNOSIS — R197 Diarrhea, unspecified: Secondary | ICD-10-CM | POA: Diagnosis not present

## 2020-11-11 DIAGNOSIS — W19XXXD Unspecified fall, subsequent encounter: Secondary | ICD-10-CM | POA: Diagnosis not present

## 2020-11-11 DIAGNOSIS — N281 Cyst of kidney, acquired: Secondary | ICD-10-CM | POA: Diagnosis not present

## 2020-11-11 DIAGNOSIS — Z20822 Contact with and (suspected) exposure to covid-19: Secondary | ICD-10-CM | POA: Diagnosis not present

## 2020-11-11 DIAGNOSIS — E876 Hypokalemia: Secondary | ICD-10-CM | POA: Diagnosis not present

## 2020-11-11 DIAGNOSIS — K921 Melena: Secondary | ICD-10-CM | POA: Diagnosis not present

## 2020-11-11 DIAGNOSIS — D509 Iron deficiency anemia, unspecified: Secondary | ICD-10-CM | POA: Diagnosis not present

## 2020-11-11 DIAGNOSIS — K8689 Other specified diseases of pancreas: Secondary | ICD-10-CM | POA: Diagnosis not present

## 2020-11-11 DIAGNOSIS — I700 Atherosclerosis of aorta: Secondary | ICD-10-CM | POA: Diagnosis not present

## 2020-11-11 DIAGNOSIS — M069 Rheumatoid arthritis, unspecified: Secondary | ICD-10-CM | POA: Diagnosis not present

## 2020-11-11 DIAGNOSIS — K6389 Other specified diseases of intestine: Secondary | ICD-10-CM | POA: Diagnosis not present

## 2020-11-11 DIAGNOSIS — J449 Chronic obstructive pulmonary disease, unspecified: Secondary | ICD-10-CM | POA: Diagnosis not present

## 2020-11-11 DIAGNOSIS — K625 Hemorrhage of anus and rectum: Secondary | ICD-10-CM | POA: Diagnosis not present

## 2020-11-11 DIAGNOSIS — R710 Precipitous drop in hematocrit: Secondary | ICD-10-CM | POA: Diagnosis not present

## 2020-11-11 DIAGNOSIS — K922 Gastrointestinal hemorrhage, unspecified: Secondary | ICD-10-CM | POA: Diagnosis not present

## 2020-11-11 DIAGNOSIS — A0472 Enterocolitis due to Clostridium difficile, not specified as recurrent: Secondary | ICD-10-CM | POA: Diagnosis not present

## 2020-11-11 DIAGNOSIS — I2510 Atherosclerotic heart disease of native coronary artery without angina pectoris: Secondary | ICD-10-CM | POA: Diagnosis not present

## 2020-11-11 DIAGNOSIS — I4891 Unspecified atrial fibrillation: Secondary | ICD-10-CM | POA: Diagnosis not present

## 2020-11-11 DIAGNOSIS — Z8546 Personal history of malignant neoplasm of prostate: Secondary | ICD-10-CM | POA: Insufficient documentation

## 2020-11-11 DIAGNOSIS — K529 Noninfective gastroenteritis and colitis, unspecified: Secondary | ICD-10-CM | POA: Diagnosis not present

## 2020-11-11 DIAGNOSIS — K219 Gastro-esophageal reflux disease without esophagitis: Secondary | ICD-10-CM | POA: Insufficient documentation

## 2020-11-14 DIAGNOSIS — K6389 Other specified diseases of intestine: Secondary | ICD-10-CM | POA: Diagnosis not present

## 2020-11-14 DIAGNOSIS — J449 Chronic obstructive pulmonary disease, unspecified: Secondary | ICD-10-CM | POA: Diagnosis not present

## 2020-11-14 DIAGNOSIS — K921 Melena: Secondary | ICD-10-CM | POA: Diagnosis not present

## 2020-11-14 DIAGNOSIS — I4891 Unspecified atrial fibrillation: Secondary | ICD-10-CM | POA: Diagnosis not present

## 2020-11-27 DIAGNOSIS — I4891 Unspecified atrial fibrillation: Secondary | ICD-10-CM | POA: Diagnosis not present

## 2020-11-27 DIAGNOSIS — D510 Vitamin B12 deficiency anemia due to intrinsic factor deficiency: Secondary | ICD-10-CM | POA: Diagnosis not present

## 2020-11-27 DIAGNOSIS — R630 Anorexia: Secondary | ICD-10-CM | POA: Insufficient documentation

## 2020-11-27 DIAGNOSIS — D649 Anemia, unspecified: Secondary | ICD-10-CM | POA: Diagnosis not present

## 2020-11-27 DIAGNOSIS — K921 Melena: Secondary | ICD-10-CM | POA: Diagnosis not present

## 2020-11-30 DIAGNOSIS — A0472 Enterocolitis due to Clostridium difficile, not specified as recurrent: Secondary | ICD-10-CM | POA: Diagnosis not present

## 2020-12-06 DIAGNOSIS — H52203 Unspecified astigmatism, bilateral: Secondary | ICD-10-CM | POA: Diagnosis not present

## 2020-12-11 ENCOUNTER — Encounter: Payer: Self-pay | Admitting: Internal Medicine

## 2020-12-14 DIAGNOSIS — D649 Anemia, unspecified: Secondary | ICD-10-CM | POA: Diagnosis not present

## 2020-12-14 DIAGNOSIS — K921 Melena: Secondary | ICD-10-CM | POA: Diagnosis not present

## 2020-12-14 DIAGNOSIS — I4891 Unspecified atrial fibrillation: Secondary | ICD-10-CM | POA: Diagnosis not present

## 2020-12-14 DIAGNOSIS — R630 Anorexia: Secondary | ICD-10-CM | POA: Diagnosis not present

## 2020-12-15 DIAGNOSIS — C61 Malignant neoplasm of prostate: Secondary | ICD-10-CM | POA: Diagnosis not present

## 2020-12-15 DIAGNOSIS — R5383 Other fatigue: Secondary | ICD-10-CM | POA: Diagnosis not present

## 2020-12-15 DIAGNOSIS — M353 Polymyalgia rheumatica: Secondary | ICD-10-CM | POA: Diagnosis not present

## 2020-12-28 DIAGNOSIS — R197 Diarrhea, unspecified: Secondary | ICD-10-CM | POA: Diagnosis not present

## 2020-12-29 ENCOUNTER — Ambulatory Visit: Payer: Medicare HMO | Admitting: Cardiology

## 2021-01-09 ENCOUNTER — Other Ambulatory Visit: Payer: Self-pay

## 2021-01-09 ENCOUNTER — Encounter: Payer: Self-pay | Admitting: Orthopaedic Surgery

## 2021-01-09 ENCOUNTER — Ambulatory Visit (INDEPENDENT_AMBULATORY_CARE_PROVIDER_SITE_OTHER): Payer: Medicare HMO | Admitting: Orthopaedic Surgery

## 2021-01-09 ENCOUNTER — Ambulatory Visit: Payer: Medicare HMO

## 2021-01-09 VITALS — BP 182/86 | HR 95 | Ht 70.0 in | Wt 136.0 lb

## 2021-01-09 DIAGNOSIS — G8929 Other chronic pain: Secondary | ICD-10-CM

## 2021-01-09 DIAGNOSIS — M25511 Pain in right shoulder: Secondary | ICD-10-CM

## 2021-01-09 NOTE — Progress Notes (Signed)
Patient [name], male DOB:September 16, 1946, 75 y.o. LKT:[id number]  Chief Complaint  Patient presents with  . Shoulder Pain    Both shoulders painful couple years     HPI  Dan [name] is a 75 y.o. male who has increasing pain of the right shoulder. It has been hurting over two years but more painful recently.  He has no trauma.  He has popping and grinding from the shoulder.  It awakens him at night.  He has no redness, no swelling, no numbness.     Body mass index is 19.51 kg/m.  ROS  Review of Systems  Constitutional: Positive for activity change.  HENT: Positive for hearing loss.   Respiratory: Positive for shortness of breath. Negative for cough.   Musculoskeletal: Positive for arthralgias and gait problem.  All other systems reviewed and are negative.   All other systems reviewed and are negative.  The following is a summary of the past history medically, past history surgically, known current medicines, social history and family history.  This information is gathered electronically by the computer from prior information and documentation.  I review this each visit and have found including this information at this point in the chart is beneficial and informative.    Past Medical History:  Diagnosis Date  . Alcohol dependency ([place])   . BPH (benign prostatic hyperplasia)   . COPD (chronic obstructive pulmonary disease) ([place])   . Depression   . ED (erectile dysfunction)   . Gout   . Hyperlipidemia   . Hypertension   . Loss of hearing   . Nocturia     Past Surgical History:  Procedure Laterality Date  . CARDIOVASCULAR STRESS TEST  12/31/2004   normal perfuction nuclear study w/ no evidence ischemia /  normal LV function and wall motion , ef 52%  . PROSTATECTOMY  2019  . TOTAL HIP ARTHROPLASTY      Family History  Problem Relation Age of Onset  . Hypertension Mother   . Cancer Father        lung    Social History Social History   Tobacco Use  .  Smoking status: Current Every Day Smoker    Types: Cigarettes    Start date: 06/12/1967  . Smokeless tobacco: Never Used    Allergies  Allergen Reactions  . Tamsulosin Hives and Itching    Current Outpatient Medications  Medication Sig Dispense Refill  . ADVAIR DISKUS 250-50 MCG/DOSE AEPB Inhale 1 puff into the lungs daily.    Marland Kitchen albuterol (PROVENTIL HFA;VENTOLIN HFA) 108 (90 Base) MCG/ACT inhaler Inhale 2 puffs into the lungs every 6 (six) hours as needed for wheezing or shortness of breath.    Marland Kitchen albuterol (PROVENTIL) (2.5 MG/3ML) 0.083% nebulizer solution Take 2.5 mg by nebulization every 6 (six) hours as needed.    . colchicine 0.6 MG tablet Take 1 tablet by mouth daily as needed.    . diltiazem (TIAZAC) 360 MG 24 hr capsule Take 360 mg by mouth daily.    . potassium chloride (MICRO-K) 10 MEQ CR capsule TAKE 2 CAPSULES BY MOUTH DAILY. 7 capsule 0  . hydrochlorothiazide (,MICROZIDE/HYDRODIURIL,) 12.5 MG capsule Take 12.5 mg by mouth daily. (Patient not taking: Reported on 01/09/2021)    . rivaroxaban (XARELTO) 20 MG TABS tablet Take 1 tablet (20 mg total) by mouth daily with supper. (Patient not taking: Reported on 01/09/2021)     No current facility-administered medications for this visit.     Physical Exam  Blood  pressure (!) 182/86, pulse 95, height 5\' 10"  (1.778 m), weight 136 lb (61.7 kg).  Constitutional: overall normal hygiene, normal nutrition, well developed, normal grooming, normal body habitus. Assistive device:none  Musculoskeletal: gait and station Limp none, muscle tone and strength are normal, no tremors or atrophy is present.  .  Neurological: coordination overall normal.  Deep tendon reflex/nerve stretch intact.  Sensation normal.  Cranial nerves II-XII intact.   Skin:   Normal overall no scars, lesions, ulcers or rashes. No psoriasis.  Psychiatric: Alert and oriented x 3.  Recent memory intact, remote memory unclear.  Normal mood and affect. Well groomed.  Good eye  contact.  Cardiovascular: overall no swelling, no varicosities, no edema bilaterally, normal temperatures of the legs and arms, no clubbing, cyanosis and good capillary refill.  Lymphatic: palpation is normal.  Right shoulder with marked crepitus and very limited ROM.  Painful. No effusion,  NV intact.  ROM neck is full.  Left shoulder pain in the extremes.  It has crepitus as well.  All other systems reviewed and are negative   The patient has been educated about the nature of the problem(s) and counseled on treatment options.  The patient appeared to understand what I have discussed and is in agreement with it.  Encounter Diagnosis  Name Primary?  . Chronic pain in right shoulder Yes   X-rays were done of the right shoulder, reported separately.  He has significant DJD changes.  I have explained the findings to him.  He may need a total shoulder.  I will inject it today to see how he does with that.  PROCEDURE NOTE:  The patient request injection, verbal consent was obtained.  The right shoulder was prepped appropriately after time out was performed.   Sterile technique was observed and injection of 1 cc of Celestone 6 mg with several cc's of plain xylocaine. Anesthesia was provided by ethyl chloride and a 20-gauge needle was used to inject the shoulder area. A posterior approach was used.  The injection was tolerated well.  A band aid dressing was applied.  The patient was advised to apply ice later today and tomorrow to the injection sight as needed.   PLAN Call if any problems.  Precautions discussed.  Continue current medications.   Return to clinic 2 weeks   Electronically Signed Sanjuana Kava, MD 3/8/202210:48 AM

## 2021-01-19 ENCOUNTER — Ambulatory Visit: Payer: Medicare HMO | Admitting: Cardiology

## 2021-01-20 ENCOUNTER — Encounter: Payer: Self-pay | Admitting: Gastroenterology

## 2021-01-20 NOTE — Progress Notes (Signed)
Referring Provider: Caryl Bis, MD Primary Care Physician:  Caryl Bis, MD Primary Gastroenterologist:  Dr. Gala Romney  Chief Complaint  Patient presents with   Diarrhea    X2-3 years   Colonoscopy    Last TCS at [facility], unable to recall when tcs was done. Had bleeding in past when he had C.diff and was on blood thinner    HPI:   Dan [name] is a 75 y.o. male presenting today at the request of Caryl Bis, MD for consult colonoscopy and hematochezia.   Patient was admitted 11/11/20-11/14/20 after presenting with loose stool and BRBPR. He presented to outside hospital for evaluation of the and was found to have a hemoglobin of 9.4 from last known baseline of 15 in June 2020. A CTA abdomen/pelvis was performed showing active GI bleeding at the level of the cecum and diffuse thickening of the right colon. He received Kcentra to reverse his anticoagulation and was flown to [facility] for further management. C. Diff positive and was started on oral vancomycin. He stopped having blood in his bowel movements after his second day of admission once starting the vancomycin. He was seen by the GI team who determined that his thickening and bleeding was most likely 2/2 the c diff colitis. At discharge, he was advised not to restart anticoagulation until he completed vancomycin.  Hemoglobin on day of discharge was 7.4. Iron panel with iron 13 (L) and ferritin 120.2.    Today: No rectal bleeding since hospital discharge. Completed course of vancomycin and had significant improvement in diarrhea, but continues with about 4 loose BMs daily, up to 6 BMs at times.  Reports when he had C. difficile, he was having BMs every 20 minutes with more stools than he did keep up with.  Reports diarrhea is chronic and has been present for 2-3 years.  Most of his bowel movements occur between the time he gets up and noon.  No nocturnal BMs.  Notes an occasional white foam in his stools.  Tried taking Imodium, but this  stopped him off and caused hard stools.  Intermittent lower abdominal pain prior to BMs that improves after 1-2 BMs.  Denies melena.  To stop iron about 1 week ago.  Reports labs completed few weeks ago with PCP and was told his hemoglobin had returned to normal.  No dietary triggers of diarrhea.  Suspects he got C. difficile from his wife who was on several courses of vancomycin last year.  She also recently had C. difficile again and just finished vancomycin last week.  Patient's wife has Crohn's disease as well.  States when his wife was checked for C. difficile, he also completed stool testing and his test was negative.  He will try eating his wife dicyclomine at times for diarrhea and abdominal cramping.  This helped somewhat with the abdominal cramping.  Does not notice much of a difference in his loose stools as he does not take until after he has had several and only takes 1.  Last colonoscopy at [facility]. Can't remember when this was or results.  Not on Xarelto.  States PCP took him off of this.  No plans to resume. Taking a probiotic.   No NSAIDs.  Weight steady for the last few weeks. Back to baseline. Eating fairly well.   Pancreatic duct prominence, measuring 4-5 mm in diameter on CTA.  No pancreatic mass identified.  No peripancreatic fluid or inflammation.  LFTs, lipase, and amylase were all normal.  Discussed this with patient today with the possibility of MRI/MRCP to further evaluate.  Patient declined. No upper abdominal pain, early satiety. Occasional short-lived nausea without vomiting without identified trigger.  Does not cause any significant trouble with eating.  No GERD symptoms or dysphagia.   Past Medical History:  Diagnosis Date   BPH (benign prostatic hyperplasia)    COPD (chronic obstructive pulmonary disease) (HCC)    Depression    ED (erectile dysfunction)    Gout    Hyperlipidemia    Hypertension    Loss of hearing    Nocturia    Paroxysmal atrial  fibrillation (HCC)    Prostate cancer [facility])     Past Surgical History:  Procedure Laterality Date   CARDIOVASCULAR STRESS TEST  12/31/2004   normal perfuction nuclear study w/ no evidence ischemia /  normal LV function and wall motion , ef 52%   PROSTATECTOMY  2019   TOTAL HIP ARTHROPLASTY      Current Outpatient Medications  Medication Sig Dispense Refill   ADVAIR DISKUS 250-50 MCG/DOSE AEPB Inhale 1 puff into the lungs daily.     albuterol (PROVENTIL HFA;VENTOLIN HFA) 108 (90 Base) MCG/ACT inhaler Inhale 2 puffs into the lungs every 6 (six) hours as needed for wheezing or shortness of breath.     albuterol (PROVENTIL) (2.5 MG/3ML) 0.083% nebulizer solution Take 2.5 mg by nebulization every 6 (six) hours as needed.     Ascorbic Acid (VITAMIN C) 1000 MG tablet Take 1,000 mg by mouth daily.     cholecalciferol (VITAMIN D3) 25 MCG (1000 UNIT) tablet Take 1,000 Units by mouth daily.     colchicine 0.6 MG tablet Take 1 tablet by mouth daily as needed.     diltiazem (TIAZAC) 360 MG 24 hr capsule Take 360 mg by mouth daily.     potassium chloride (MICRO-K) 10 MEQ CR capsule TAKE 2 CAPSULES BY MOUTH DAILY. 7 capsule 0   Probiotic Product (PROBIOTIC DAILY PO) Take by mouth.     Zinc 50 MG TABS Take 1 tablet by mouth daily.     polyethylene glycol-electrolytes (NULYTELY) 420 g solution As directed 4000 mL 0   No current facility-administered medications for this visit.    Allergies as of 01/22/2021 - Review Complete 01/22/2021  Allergen Reaction Noted   Tamsulosin Hives and Itching 09/22/2018    Family History  Problem Relation Age of Onset   Hypertension Mother    Cancer Father        lung   Colon cancer Neg Hx    Inflammatory bowel disease Neg Hx    Pancreatic cancer Neg Hx     Social History   Socioeconomic History   Marital status: Single    Spouse name: Not on file   Number of children: Not on file   Years of education: Not on file   Highest  education level: Not on file  Occupational History   Not on file  Tobacco Use   Smoking status: Current Every Day Smoker    Types: Cigarettes    Start date: 06/12/1967   Smokeless tobacco: Never Used  Substance and Sexual Activity   Alcohol use: Not Currently   Drug use: Not Currently   Sexual activity: Not on file  Other Topics Concern   Not on file  Social History Narrative   Not on file   Social Determinants of Health   Financial Resource Strain: Not on file  Food Insecurity: Not on file  Transportation Needs: Not  on file  Physical Activity: Not on file  Stress: Not on file  Social Connections: Not on file  Intimate Partner Violence: Not on file    Review of Systems: Gen: Denies any fever, chills, cold or flulike symptoms, lightheadedness, dizziness, presyncope, syncope. CV: Denies chest pain or palpitations. Resp: Denies shortness of breath or cough. GI: See HPI GU : Denies urinary burning, urinary frequency, urinary hesitancy MS: Denies joint pain Derm: Denies rash Psych: Denies depression or anxiety Heme: See HPI  Physical Exam: BP (!) 160/90    Pulse 99    Temp (!) 97.3 F (36.3 C) (Temporal)    Ht 5\' 10"  (1.778 m)    Wt 136 lb 9.6 oz (62 kg)    BMI 19.60 kg/m  General:   Alert and oriented. Pleasant and cooperative. Well-nourished and well-developed.  Head:  Normocephalic and atraumatic. Eyes:  Without icterus, sclera clear and conjunctiva pink.  Ears: Decreased auditory acuity. Lungs:  Clear to auscultation bilaterally. No wheezes, rales, or rhonchi. No distress.  Heart:  S1, S2 present without murmurs appreciated.  Abdomen:  +BS, soft, non-tender and non-distended. No HSM noted. No guarding or rebound. No masses appreciated.  Rectal:  Deferred  Msk:  Symmetrical without gross deformities. Normal posture. Extremities:  Without edema. Neurologic:  Alert and  oriented x4;  grossly normal neurologically. Skin:  Intact without significant lesions or  rashes. Psych: Normal mood and affect.    Assessment: 75 year old male with history of atrial fibrillation no longer on anticoagulation, COPD, prostate cancer s/p prostatectomy in 2019, GERD, and 2-3-year history of diarrhea presenting today following recent hospitalization at [facility] in January 2022 with rectal bleeding and C. difficile colitis.   Patient reports having too numerous to count BMs prior to hospitalization with overt bright red blood per rectum.  CTA A/P during admission with active GI bleeding at the level of the cecum and diffuse thickening of the right colon to proximal transverse colon.  Found to be positive for C. difficile and the GI team felt this was likely the cause of his acute GI bleed and colonic wall thickening.  No colonoscopy performed while inpatient.  Anticoagulation was stopped, he received Kcentra, and he was treated for C. difficile with vancomycin.   He had improvement in diarrhea, but is now back to his baseline of 4-6 loose BMs daily. No identified trigger. Denies nocturnal BMs, BRBPR since hospital discharge, or melena.  Mild lower abdominal cramping prior to BMs that improves thereafter.  Tried Imodium for ongoing diarrhea, but this caused constipation.  Occasionally will 1025/dicyclomine which does help with abdominal cramping. Per patient, he had repeat labs with PCP a few weeks ago and was told his hemoglobin was back to normal.  He discontinued oral iron.  Anticoagulation has also been discontinued indefinitely. Notably, patient likely contracted C. difficile from his wife who has had several bouts of C. difficile over the last year and actually recently completed another course of vancomycin last week.  Per patient, he was also rechecked for C. difficile a couple weeks ago when his wife was tested, and he was negative.  C. difficile colitis likely etiology of bowel wall thickening and acute GI bleed.  Cannot rule out malignancy contributing to  hematochezia. Doubt ongoing diarrhea is secondary to persistent C. diff as he notes stool frequency has improved, but this remains in the differential.  Additional differentials include other infectious diarrhea, IBS/post infectious IBS, celiac disease, thyroid abnormalities, microscopic colitis, and less likely  IBD.  Last colonoscopy was years ago at [facility], unable to remember results or timing.  He is going to need colonoscopy to follow-up on GI bleeding, abnormal CT findings, and chronic diarrhea.  I will also request recent labs completed with PCP and prior colonoscopy records.  Pending results, will consider additional stool studies, thyroid testing, and celiac serologies.   Pancreatic duct prominence: CTA A/P January 2020.  With new right duct prominence measuring 4 x 5 mm in diameter.  No pancreatic mass identified.  No pancreatic fluid or inflammation.  LFTs within normal limits.  Lipase and amylase normal.  Patient has no significant upper GI symptoms aside from occasional short lived nausea without vomiting.  No family history of pancreatic cancer.  Discussed possibility of MRI/MRCP to evaluate this further, but patient declined.    Plan: 1.  Proceed with colonoscopy with Dr. Gala Romney in the near future.  Recommend random colon biopsies to evaluate for microscopic colitis. The risks, benefits, and alternatives have been discussed with the patient in detail. The patient states understanding and desires to proceed.  ASA II *Patient is no longer on anticoagulation.  2.  Request recent labs and stool studies from primary care.  Pending review, will consider additional stool testing, thyroid testing, celiac testing. 3. Continue daily probiotic.  4.  No further evaluation of pancreatic duct prominence.  Patient declined MRI/MRCP. 5.  Follow-up in office after colonoscopy.   Aliene Altes, PA-C [facility] 01/22/2021

## 2021-01-22 ENCOUNTER — Other Ambulatory Visit: Payer: Self-pay

## 2021-01-22 ENCOUNTER — Encounter: Payer: Self-pay | Admitting: Gastroenterology

## 2021-01-22 ENCOUNTER — Encounter: Payer: Self-pay | Admitting: *Deleted

## 2021-01-22 ENCOUNTER — Ambulatory Visit (INDEPENDENT_AMBULATORY_CARE_PROVIDER_SITE_OTHER): Payer: Medicare HMO | Admitting: Gastroenterology

## 2021-01-22 DIAGNOSIS — Z8619 Personal history of other infectious and parasitic diseases: Secondary | ICD-10-CM

## 2021-01-22 DIAGNOSIS — K8689 Other specified diseases of pancreas: Secondary | ICD-10-CM

## 2021-01-22 DIAGNOSIS — R933 Abnormal findings on diagnostic imaging of other parts of digestive tract: Secondary | ICD-10-CM

## 2021-01-22 DIAGNOSIS — R197 Diarrhea, unspecified: Secondary | ICD-10-CM | POA: Diagnosis not present

## 2021-01-22 MED ORDER — PEG 3350-KCL-NA BICARB-NACL 420 G PO SOLR: ORAL | 0 refills | Status: DC

## 2021-01-22 NOTE — Patient Instructions (Signed)
We will arrange for you to have a colonoscopy in the near future with Dr. Gala Romney. If you resume a blood thinner prior to your colonoscopy, its important you let us know as we will need to make adjustments prior to your procedure.  I am requesting recent labs and stool studies from your primary care provider.  I will reach out to you once I review these.  We will plan to see back in the office after your procedures.  It was good to meet you today.   Aliene Altes, PA-C [facility]

## 2021-01-23 NOTE — Progress Notes (Signed)
CC'ED TO PCP 

## 2021-01-25 ENCOUNTER — Ambulatory Visit: Payer: Medicare HMO | Admitting: Orthopaedic Surgery

## 2021-01-25 ENCOUNTER — Telehealth: Payer: Self-pay | Admitting: Gastroenterology

## 2021-01-25 NOTE — Telephone Encounter (Signed)
And reviewed colonoscopy report dated 05/03/2016 with Dr. Britta Mccreedy.  Indication: 75 year old male here for colonoscopy due to chronic diarrhea and rectal bleeding. Impression: Sessile polyp ranging between 5-9 mm in size was found in the rectum and proximal descending colon; polypectomy was performed using snare cautery.  Retroflexed views revealed no abnormalities.   Pathology revealed hyperplastic polyps. Recommendations: 1.  Await pathology results. 2.  Citrucel 1 teaspoon twice daily. Recall: Return in 5 years for colonoscopy.  Also received and reviewed labs from PCP: 11/27/2020 CBC: WBC 9.2, hemoglobin 9.2 (L), hematocrit 28.9 (L), MCV 90, MCH 28.6, MCHC 31.8, platelets 677 (H). CMP: Glucose 78, BUN 12, creatinine 0.98, sodium 139, potassium 5.1, chloride 102, albumin 3.5 (L), total bilirubin <0.2, alk phos 106, AST 17, ALT 10. Magnesium: 1.9. TSH: 2.290 Anemia panel: Iron 41, iron saturation 14 (L), ferritin 104, vitamin B12 437, folate 5.5.  12/18/2020 WBC 8.5, hemoglobin 10.2 (L), hematocrit 33.3 (L), MCV 88, MCH 26.8, MCHC 30.6 (L), platelets 611 (H) ANA direct: Positive Rheumatoid factor: 10.6 Sed rate 38 (H)  Anti-CCP antibody IgG/IgA: 3 (negative)   No stool studies received, but I doubt ongoing C diff at this point as he reports 4-6 loose BMs daily which was his baseline prior to C. Diff in January. Apparently had diarrhea back in 2017 as well at the time of his colonoscopy.    Alicia: Please let patient know I have received and reviewed his labs completed with primary care.  His hemoglobin has improved since his hospital discharge and was up to 10.2 in February, but this is still low (normal is 13 and above).  Per review of PCPs result note, recommended continuing current medications.  There were no recommendations to discontinue iron at that time.  Recommend he go ahead and resume oral iron daily.  However, he will need to hold this for 7 days prior to his colonoscopy.  I  did not receive any stool studies which patient reported he completed recently. Can we double check with PCPs office on this?   In general, I doubt ongoing C diff as he reports bowel habits are back to baseline prior to C. diff.   Recommendations: 1. Proceeding with colonoscopy as already scheduled for further evaluation of chronic diarrhea. If any worsening of his diarrhea, he should let me know and we will repeat stool studies.  2.  I would like to screen him for celiac disease. Please arrange IgA and TTG IgA.  3.  If he would like, I can try him on low dose dicyclomine (Bentyl) to help with diarrhea and intermittent abdominal cramping while waiting on colonoscopy. Possible side effects include: dizziness, urinary trouble, dry mouth, and constipation.

## 2021-01-29 ENCOUNTER — Telehealth: Payer: Self-pay | Admitting: Internal Medicine

## 2021-01-29 ENCOUNTER — Other Ambulatory Visit: Payer: Self-pay

## 2021-01-29 ENCOUNTER — Other Ambulatory Visit: Payer: Self-pay | Admitting: Gastroenterology

## 2021-01-29 DIAGNOSIS — Z79899 Other long term (current) drug therapy: Secondary | ICD-10-CM

## 2021-01-29 DIAGNOSIS — R197 Diarrhea, unspecified: Secondary | ICD-10-CM

## 2021-01-29 MED ORDER — DICYCLOMINE HCL 10 MG PO CAPS: ORAL_CAPSULE | ORAL | 2 refills | Status: DC

## 2021-01-29 NOTE — Telephone Encounter (Signed)
Spoke with pt. Pt was notified of results. Lab orders placed for [facility]. Pt will complete labs as directed. Pt start back on iron once daily and hold for 1 week prior to his apt. Pt does want to try Bentyl. Please send medication in.

## 2021-01-29 NOTE — Telephone Encounter (Signed)
Lmom, waiting on a return call.  

## 2021-01-29 NOTE — Telephone Encounter (Signed)
Rx for Bentyl sent to pharmacy.  

## 2021-01-29 NOTE — Telephone Encounter (Signed)
Pt returning call

## 2021-01-29 NOTE — Telephone Encounter (Signed)
Noted  

## 2021-01-30 DIAGNOSIS — R5383 Other fatigue: Secondary | ICD-10-CM | POA: Diagnosis not present

## 2021-01-30 DIAGNOSIS — M2550 Pain in unspecified joint: Secondary | ICD-10-CM | POA: Diagnosis not present

## 2021-01-30 DIAGNOSIS — R768 Other specified abnormal immunological findings in serum: Secondary | ICD-10-CM | POA: Diagnosis not present

## 2021-01-30 DIAGNOSIS — Z681 Body mass index (BMI) 19 or less, adult: Secondary | ICD-10-CM | POA: Diagnosis not present

## 2021-01-30 DIAGNOSIS — M159 Polyosteoarthritis, unspecified: Secondary | ICD-10-CM | POA: Diagnosis not present

## 2021-01-30 DIAGNOSIS — M1009 Idiopathic gout, multiple sites: Secondary | ICD-10-CM | POA: Diagnosis not present

## 2021-02-01 DIAGNOSIS — Z79899 Other long term (current) drug therapy: Secondary | ICD-10-CM | POA: Diagnosis not present

## 2021-02-01 DIAGNOSIS — R197 Diarrhea, unspecified: Secondary | ICD-10-CM | POA: Diagnosis not present

## 2021-02-02 LAB — IGA: Immunoglobulin A: 296 mg/dL (ref 70–320)

## 2021-02-02 LAB — TISSUE TRANSGLUTAMINASE, IGA: (tTG) Ab, IgA: <1 U/mL

## 2021-02-06 ENCOUNTER — Other Ambulatory Visit: Payer: Self-pay

## 2021-02-06 ENCOUNTER — Encounter: Payer: Self-pay | Admitting: Orthopaedic Surgery

## 2021-02-06 ENCOUNTER — Ambulatory Visit (INDEPENDENT_AMBULATORY_CARE_PROVIDER_SITE_OTHER): Payer: Medicare HMO | Admitting: Orthopaedic Surgery

## 2021-02-06 DIAGNOSIS — F17210 Nicotine dependence, cigarettes, uncomplicated: Secondary | ICD-10-CM

## 2021-02-06 DIAGNOSIS — M25511 Pain in right shoulder: Secondary | ICD-10-CM | POA: Diagnosis not present

## 2021-02-06 DIAGNOSIS — G8929 Other chronic pain: Secondary | ICD-10-CM | POA: Diagnosis not present

## 2021-02-06 NOTE — Patient Instructions (Signed)
Steps to Quit Smoking Smoking tobacco is the leading cause of preventable death. It can affect almost every organ in the body. Smoking puts you and people around you at risk for many serious, long-lasting (chronic) diseases. Quitting smoking can be hard, but it is one of the best things that you can do for your health. It is never too late to quit. How do I get ready to quit? When you decide to quit smoking, make a plan to help you succeed. Before you quit:  Pick a date to quit. Set a date within the next 2 weeks to give you time to prepare.  Write down the reasons why you are quitting. Keep this list in places where you will see it often.  Tell your family, friends, and co-workers that you are quitting. Their support is important.  Talk with your doctor about the choices that may help you quit.  Find out if your health insurance will pay for these treatments.  Know the people, places, things, and activities that make you want to smoke (triggers). Avoid them. What first steps can I take to quit smoking?  Throw away all cigarettes at home, at work, and in your car.  Throw away the things that you use when you smoke, such as ashtrays and lighters.  Clean your car. Make sure to empty the ashtray.  Clean your home, including curtains and carpets. What can I do to help me quit smoking? Talk with your doctor about taking medicines and seeing a counselor at the same time. You are more likely to succeed when you do both.  If you are pregnant or breastfeeding, talk with your doctor about counseling or other ways to quit smoking. Do not take medicine to help you quit smoking unless your doctor tells you to do so. To quit smoking: Quit right away  Quit smoking totally, instead of slowly cutting back on how much you smoke over a period of time.  Go to counseling. You are more likely to quit if you go to counseling sessions regularly. Take medicine You may take medicines to help you quit. Some  medicines need a prescription, and some you can buy over-the-counter. Some medicines may contain a drug called nicotine to replace the nicotine in cigarettes. Medicines may:  Help you to stop having the desire to smoke (cravings).  Help to stop the problems that come when you stop smoking (withdrawal symptoms). Your doctor may ask you to use:  Nicotine patches, gum, or lozenges.  Nicotine inhalers or sprays.  Non-nicotine medicine that is taken by mouth. Find resources Find resources and other ways to help you quit smoking and remain smoke-free after you quit. These resources are most helpful when you use them often. They include:  Online chats with a counselor.  Phone quitlines.  Printed self-help materials.  Support groups or group counseling.  Text messaging programs.  Mobile phone apps. Use apps on your mobile phone or tablet that can help you stick to your quit plan. There are many free apps for mobile phones and tablets as well as websites. Examples include Quit Guide from the CDC and smokefree.gov   What things can I do to make it easier to quit?  Talk to your family and friends. Ask them to support and encourage you.  Call a phone quitline (1-800-QUIT-NOW), reach out to support groups, or work with a counselor.  Ask people who smoke to not smoke around you.  Avoid places that make you want to smoke,   such as: ? Bars. ? Parties. ? Smoke-break areas at work.  Spend time with people who do not smoke.  Lower the stress in your life. Stress can make you want to smoke. Try these things to help your stress: ? Getting regular exercise. ? Doing deep-breathing exercises. ? Doing yoga. ? Meditating. ? Doing a body scan. To do this, close your eyes, focus on one area of your body at a time from head to toe. Notice which parts of your body are tense. Try to relax the muscles in those areas.   How will I feel when I quit smoking? Day 1 to 3 weeks Within the first 24 hours,  you may start to have some problems that come from quitting tobacco. These problems are very bad 2-3 days after you quit, but they do not often last for more than 2-3 weeks. You may get these symptoms:  Mood swings.  Feeling restless, nervous, angry, or annoyed.  Trouble concentrating.  Dizziness.  Strong desire for high-sugar foods and nicotine.  Weight gain.  Trouble pooping (constipation).  Feeling like you may vomit (nausea).  Coughing or a sore throat.  Changes in how the medicines that you take for other issues work in your body.  Depression.  Trouble sleeping (insomnia). Week 3 and afterward After the first 2-3 weeks of quitting, you may start to notice more positive results, such as:  Better sense of smell and taste.  Less coughing and sore throat.  Slower heart rate.  Lower blood pressure.  Clearer skin.  Better breathing.  Fewer sick days. Quitting smoking can be hard. Do not give up if you fail the first time. Some people need to try a few times before they succeed. Do your best to stick to your quit plan, and talk with your doctor if you have any questions or concerns. Summary  Smoking tobacco is the leading cause of preventable death. Quitting smoking can be hard, but it is one of the best things that you can do for your health.  When you decide to quit smoking, make a plan to help you succeed.  Quit smoking right away, not slowly over a period of time.  When you start quitting, seek help from your doctor, family, or friends. This information is not intended to replace advice given to you by your health care provider. Make sure you discuss any questions you have with your health care provider. Document Revised: 07/16/2019 Document Reviewed: 01/09/2019 Elsevier Patient Education  2021 Elsevier Inc.  

## 2021-02-06 NOTE — Progress Notes (Signed)
PROCEDURE NOTE:  The patient request injection, verbal consent was obtained.  The right shoulder was prepped appropriately after time out was performed.   Sterile technique was observed and injection of 1 cc of Celestone 6 mg with several cc's of plain xylocaine. Anesthesia was provided by ethyl chloride and a 20-gauge needle was used to inject the shoulder area. A posterior approach was used.  The injection was tolerated well.  A band aid dressing was applied.  The patient was advised to apply ice later today and tomorrow to the injection sight as needed.  Return in three weeks.  Electronically Signed Sanjuana Kava, MD 4/5/20228:20 AM

## 2021-02-12 ENCOUNTER — Telehealth: Payer: Self-pay | Admitting: Orthopaedic Surgery

## 2021-02-12 ENCOUNTER — Telehealth: Payer: Self-pay | Admitting: Internal Medicine

## 2021-02-12 NOTE — Telephone Encounter (Signed)
Patient called following office visit 02/06/21, at which time a steroid injection was done for right shoulder. Patient and wife are asking if he may be able to have a hyaluronic injection due to not feeling much better?

## 2021-02-12 NOTE — Telephone Encounter (Signed)
Noted  

## 2021-02-12 NOTE — Telephone Encounter (Signed)
Received call from patient and he wants to cancel TCS for now. He does not want to have it done until fall. I advised we do not have our schedule out that far. He will call us to make an appt to get rescheduled. Endo aware of cancellation

## 2021-02-12 NOTE — Telephone Encounter (Signed)
Patient wants to cancel his procedure and reschedule in fall

## 2021-02-13 NOTE — Telephone Encounter (Signed)
Per verbal response by Dr Luna Glasgow, hyaluronic injections are for knees only. Called patient; left message.

## 2021-02-14 ENCOUNTER — Telehealth: Payer: Self-pay | Admitting: Orthopaedic Surgery

## 2021-02-14 NOTE — Telephone Encounter (Signed)
Patient voiced understanding regarding injection (per previous phone note - hyaluronic injections for knee, not shoulder. Patient is asking if any other recommendations or may he be seen sooner than 02/27/21?

## 2021-02-15 NOTE — Telephone Encounter (Signed)
Called.  done

## 2021-02-15 NOTE — Telephone Encounter (Signed)
I can see at any time.

## 2021-02-22 ENCOUNTER — Encounter: Payer: Self-pay | Admitting: Orthopaedic Surgery

## 2021-02-22 ENCOUNTER — Ambulatory Visit: Payer: Medicare HMO | Admitting: Orthopaedic Surgery

## 2021-02-22 ENCOUNTER — Other Ambulatory Visit: Payer: Self-pay

## 2021-02-22 VITALS — Ht 70.0 in | Wt 136.0 lb

## 2021-02-22 DIAGNOSIS — M25511 Pain in right shoulder: Secondary | ICD-10-CM | POA: Diagnosis not present

## 2021-02-22 DIAGNOSIS — G8929 Other chronic pain: Secondary | ICD-10-CM

## 2021-02-22 NOTE — Progress Notes (Signed)
PROCEDURE NOTE:  The patient request injection, verbal consent was obtained.  The right shoulder was prepped appropriately after time out was performed.   Sterile technique was observed and injection of 1 cc of Celestone 6 mg with several cc's of plain xylocaine. Anesthesia was provided by ethyl chloride and a 20-gauge needle was used to inject the shoulder area. A posterior approach was used.  The injection was tolerated well.  A band aid dressing was applied.  The patient was advised to apply ice later today and tomorrow to the injection sight as needed.  Return in one month.  Call if any problem.  Precautions discussed.   Electronically Signed Sanjuana Kava, MD 4/21/202210:27 AM

## 2021-02-27 ENCOUNTER — Ambulatory Visit: Payer: Medicare HMO | Admitting: Orthopaedic Surgery

## 2021-02-27 ENCOUNTER — Telehealth: Payer: Self-pay | Admitting: Orthopaedic Surgery

## 2021-02-27 MED ORDER — HYDROCODONE-ACETAMINOPHEN 5-325 MG PO TABS: 1.0000 | ORAL_TABLET | ORAL | 0 refills | Status: AC | PRN

## 2021-02-27 NOTE — Telephone Encounter (Signed)
Patient called asking if Dr Dan [name] would prescribe pain medication, such as Hydrocodone. States uses Manufacturing engineer in [place].  Please advise. Aware of next scheduled appointment 03/22/21.

## 2021-02-28 ENCOUNTER — Encounter: Payer: Self-pay | Admitting: *Deleted

## 2021-03-01 ENCOUNTER — Ambulatory Visit: Payer: Medicare HMO | Admitting: Cardiology

## 2021-03-01 ENCOUNTER — Encounter: Payer: Self-pay | Admitting: Cardiology

## 2021-03-01 VITALS — BP 162/68 | HR 91 | Ht 70.0 in | Wt 146.0 lb

## 2021-03-01 DIAGNOSIS — I4891 Unspecified atrial fibrillation: Secondary | ICD-10-CM | POA: Diagnosis not present

## 2021-03-01 DIAGNOSIS — I10 Essential (primary) hypertension: Secondary | ICD-10-CM | POA: Diagnosis not present

## 2021-03-01 DIAGNOSIS — R002 Palpitations: Secondary | ICD-10-CM | POA: Diagnosis not present

## 2021-03-01 DIAGNOSIS — Z01810 Encounter for preprocedural cardiovascular examination: Secondary | ICD-10-CM

## 2021-03-01 NOTE — Patient Instructions (Signed)
Your physician recommends that you schedule a follow-up appointment in: [place]  Your physician recommends that you continue on your current medications as directed. Please refer to the Current Medication list given to you today.  Thank you for choosing [facility]!!

## 2021-03-01 NOTE — Progress Notes (Signed)
Clinical Summary Mr. Hartstein is a 75 y.o.male former patient of Dr Lambert Mody, this is our first visit together. Seen for the following medical problems.    1. Palpitations/Afib - from prior cardiology notes EKGs have shown PACs, PVCs. Runs of atach.  01/2020 30 day monitor: SR, PACs, PVCs, paroxysms of afib - had been on eliquis, from report heavy brusiing??  - no recent palpitatios - on eliquis previously, pcp later changed to xarelto for what I believe was heavy bruising. - while on xarelto GI bleeding in January with admission to [facility] - do not have the [facility] records at this time   2. COPD  3. HTN - compliant with meds - home bp's 120s/60s. Reports history of white coat HTN.   4. Shoulder surgery - considering shoulder replacement - reports some SOB with walking 1 flight of stairs - prior hip replacement, gout limits mobility -02/2020 Nuclear stress: inferior infarct, minimal ischemia Past Medical History:  Diagnosis Date  . BPH (benign prostatic hyperplasia)   . COPD (chronic obstructive pulmonary disease) ([place])   . Depression   . ED (erectile dysfunction)   . Gout   . Hyperlipidemia   . Hypertension   . Loss of hearing   . Nocturia   . Paroxysmal atrial fibrillation (HCC)   . Prostate cancer ([place])      Allergies  Allergen Reactions  . Tamsulosin Hives and Itching     Current Outpatient Medications  Medication Sig Dispense Refill  . ADVAIR DISKUS 250-50 MCG/DOSE AEPB Inhale 1 puff into the lungs daily.    Marland Kitchen albuterol (PROVENTIL HFA;VENTOLIN HFA) 108 (90 Base) MCG/ACT inhaler Inhale 2 puffs into the lungs every 6 (six) hours as needed for wheezing or shortness of breath.    Marland Kitchen albuterol (PROVENTIL) (2.5 MG/3ML) 0.083% nebulizer solution Take 2.5 mg by nebulization every 6 (six) hours as needed.    Marland Kitchen allopurinol (ZYLOPRIM) 100 MG tablet Take 100 mg by mouth daily.    . Ascorbic Acid (VITAMIN C) 1000 MG tablet Take 1,000 mg by mouth daily.    .  cholecalciferol (VITAMIN D3) 25 MCG (1000 UNIT) tablet Take 1,000 Units by mouth daily.    . colchicine 0.6 MG tablet Take 1 tablet by mouth daily as needed.    . dicyclomine (BENTYL) 10 MG capsule Take 1 capsule (10 mg total) by mouth daily before breakfast.  You may increase this to twice daily if needed. Hold in the setting of constipation. 60 capsule 2  . diltiazem (TIAZAC) 360 MG 24 hr capsule Take 360 mg by mouth daily.    Marland Kitchen HYDROcodone-acetaminophen (NORCO/VICODIN) 5-325 MG tablet Take 1 tablet by mouth every 4 (four) hours as needed for up to 5 days for moderate pain. 30 tablet 0  . potassium chloride (MICRO-K) 10 MEQ CR capsule TAKE 2 CAPSULES BY MOUTH DAILY. 7 capsule 0  . Probiotic Product (PROBIOTIC DAILY PO) Take by mouth.    . Zinc 50 MG TABS Take 1 tablet by mouth daily.     No current facility-administered medications for this visit.     Past Surgical History:  Procedure Laterality Date  . CARDIOVASCULAR STRESS TEST  12/31/2004   normal perfuction nuclear study w/ no evidence ischemia /  normal LV function and wall motion , ef 52%  . PROSTATECTOMY  2019  . TOTAL HIP ARTHROPLASTY       Allergies  Allergen Reactions  . Tamsulosin Hives and Itching  Family History  Problem Relation Age of Onset  . Hypertension Mother   . Cancer Father        lung  . Colon cancer Neg Hx   . Inflammatory bowel disease Neg Hx   . Pancreatic cancer Neg Hx      Social History Mr. Minteer reports that he has been smoking cigarettes. He started smoking about 53 years ago. He has never used smokeless tobacco. Mr. Gell reports previous alcohol use.   Review of Systems CONSTITUTIONAL: No weight loss, fever, chills, weakness or fatigue.  HEENT: Eyes: No visual loss, blurred vision, double vision or yellow sclerae.No hearing loss, sneezing, congestion, runny nose or sore throat.  SKIN: No rash or itching.  CARDIOVASCULAR:pe rhpi  RESPIRATORY: No shortness of breath, cough or  sputum.  GASTROINTESTINAL: No anorexia, nausea, vomiting or diarrhea. No abdominal pain or blood.  GENITOURINARY: No burning on urination, no polyuria NEUROLOGICAL: No headache, dizziness, syncope, paralysis, ataxia, numbness or tingling in the extremities. No change in bowel or bladder control.  MUSCULOSKELETAL: No muscle, back pain, joint pain or stiffness.  LYMPHATICS: No enlarged nodes. No history of splenectomy.  PSYCHIATRIC: No history of depression or anxiety.  ENDOCRINOLOGIC: No reports of sweating, cold or heat intolerance. No polyuria or polydipsia.  Marland Kitchen   Physical Examination Today's Vitals   03/01/21 1037  BP: (!) 162/68  Pulse: 91  SpO2: 96%  Weight: 146 lb (66.2 kg)  Height: 5\' 10"  (1.778 m)   Body mass index is 20.95 kg/m.  Gen: resting comfortably, no acute distress HEENT: no scleral icterus, pupils equal round and reactive, no palptable cervical adenopathy,  CV: RRR, no m/r/g, no jvd Resp: Clear to auscultation bilaterally GI: abdomen is soft, non-tender, non-distended, normal bowel sounds, no hepatosplenomegaly MSK: extremities are warm, no edema.  Skin: warm, no rash Neuro:  no focal deficits Psych: appropriate affect   Assessment and Plan  1. Afib - off anticoag due to recent GI bleed, will review records and discuss potentially restarting anticoag in the near future.   2. HTN - home bp's at goal though elevated here, he reports  History of white coat HTN - continue current meds  3. Preoperative evaluatin - cannot assess functional capacity by history, limited by chronic leg pains - stress test last year prior infarct, minimal current ischemia - recommend proceeding with surgery as planned, no additional cardiac testing is indicated.     Arnoldo Lenis, M.D.

## 2021-03-12 ENCOUNTER — Encounter: Payer: Self-pay | Admitting: Orthopedic Surgery

## 2021-03-12 ENCOUNTER — Other Ambulatory Visit (HOSPITAL_COMMUNITY)
Admission: RE | Admit: 2021-03-12 | Discharge: 2021-03-12 | Disposition: A | Discharge status: Home / Self Care | Payer: Medicare HMO | Source: Ambulatory Visit | Attending: Orthopedic Surgery | Admitting: Orthopedic Surgery

## 2021-03-12 ENCOUNTER — Ambulatory Visit: Payer: Medicare HMO | Admitting: Orthopedic Surgery

## 2021-03-12 DIAGNOSIS — M19011 Primary osteoarthritis, right shoulder: Secondary | ICD-10-CM

## 2021-03-14 ENCOUNTER — Encounter (HOSPITAL_COMMUNITY): Payer: Self-pay

## 2021-03-14 ENCOUNTER — Ambulatory Visit (HOSPITAL_COMMUNITY): Admit: 2021-03-14 | Payer: Medicare HMO | Admitting: Internal Medicine

## 2021-03-14 ENCOUNTER — Encounter: Payer: Self-pay | Admitting: Orthopedic Surgery

## 2021-03-14 SURGERY — COLONOSCOPY
Anesthesia: Moderate Sedation

## 2021-03-14 NOTE — Progress Notes (Signed)
Office Visit Note   Patient: Dan [name]           Date of Birth: Oct 08, 1946           MRN: [id number] Visit Date: 03/12/2021 Requested by: Caryl Bis, MD [place],  [place] [id number] PCP: Caryl Bis, MD  Subjective: Chief Complaint  Patient presents with  . Right Shoulder - Pain    HPI: Dan [name] is a 75 y.o. male who presents to the office complaining of right and left shoulder pain.  Primary complaint is right shoulder pain.  He has had pain for several years on and off that has been significantly worse in the last year.  He has been receiving injections by Dr. Luna Glasgow that have been providing fleeting relief.  Last injection was 3 weeks ago and provide several weeks of relief.  He is currently retired and mostly does housework.  Localizes pain to the lateral and anterior aspects of the right shoulder with radiation to the posterior elbow.  Denies any numbness or tingling or any radiation past the elbow.  He has very rare occasions of shoulder blade pain but no neck pain.  He has severely limited range of motion that prevents him from lifting objects due to pain.  He is right-handed.  He does have history of atrial fibrillation as well as COPD.  He is currently smoking cigarettes.  Denies any history of diabetes.  His cardiologist is Dr. Harl Bowie.  He does not take any blood thinners for his atrial fibrillation.  No history of prior right shoulder surgery..                ROS: All systems reviewed are negative as they relate to the chief complaint within the history of present illness.  Patient denies fevers or chills.  Assessment & Plan: Visit Diagnoses:  1. Primary osteoarthritis, right shoulder     Plan: Patient is a 75 year old male who presents complaining of right shoulder pain.  Patient has a history of several years of shoulder pain for which she has been receiving serial injections by Dr. Luna Glasgow.  He is here today for evaluation for consideration of surgery.   Radiographs were reviewed and do reveal severe arthritis of the right glenohumeral joint.  He has excellent rotator cuff strength on exam.  There is crepitus and severe pain noted through passive motion of the shoulder that is consistent with glenohumeral arthritis.  Discussed options available to patient including continuing with serial injections and living with the pain versus proceeding with total shoulder arthroplasty.  Discussed the risks and benefits of shoulder arthroplasty as well as the recovery timeline.  Discussed the risk of shoulder stiffness, shoulder instability, nerve and vessel damage, need for revision surgery, prosthetic joint infection, medical complication from surgery, intraoperative fracture.  After lengthy discussion with Dr. Marlou Sa, patient would like to consider his options and do some more research on the surgery by himself.  He will call the office if he would like to schedule surgery.  Follow-Up Instructions: No follow-ups on file.   Orders:  No orders of the defined types were placed in this encounter.  No orders of the defined types were placed in this encounter.     Procedures: No procedures performed   Clinical Data: No additional findings.  Objective: Vital Signs: There were no vitals taken for this visit.  Physical Exam:  Constitutional: Patient appears well-developed HEENT:  Head: Normocephalic Eyes:EOM are normal Neck: Normal  range of motion Cardiovascular: Normal rate Pulmonary/chest: Effort normal Neurologic: Patient is alert Skin: Skin is warm Psychiatric: Patient has normal mood and affect  Ortho Exam: Ortho exam demonstrates right shoulder with 20 degrees external rotation, 75 degrees abduction, 125 degrees forward flexion.  5/5 motor strength of supraspinatus, infraspinatus, subscapularis.  Axillary nerve intact with deltoid firing.  2+ radial pulse of the right upper extremity.  5/5 motor strength of bilateral grip strength, finger  abduction, pronation/supination, bicep, tricep, deltoid.  Severe painful crepitus noted with passive motion of the right shoulder consistent with glenohumeral arthritis.  Specialty Comments:  No specialty comments available.  Imaging: No results found.   PMFS History: Patient Active Problem List   Diagnosis Date Noted  . Abnormal CT scan, colon 01/22/2021  . History of Clostridioides difficile colitis 01/22/2021  . Diarrhea 01/22/2021  . Dilated pancreatic duct 01/22/2021  . Loss of appetite 11/27/2020  . GERD (gastroesophageal reflux disease) 11/11/2020  . Hematochezia 11/11/2020  . History of prostate cancer 11/11/2020  . Chest wall pain 10/31/2020  . Anemia 08/02/2020  . Other specified personal risk factors, not elsewhere classified 07/18/2020  . Body mass index (BMI) 22.0-22.9, adult 04/20/2020  . PMR (polymyalgia rheumatica) ([place]) 03/29/2020  . Fatigue 03/28/2020  . Chronic diastolic CHF (congestive heart failure) ([place]) 03/09/2020  . Atrial fibrillation ([place]) 12/15/2019  . Claudication ([place]) 12/15/2019  . Palpitations 12/15/2019  . Cystitis 09/20/2019  . Nicotine dependence, cigarettes, uncomplicated 53/97/6734  . Prostate cancer ([place]) 09/23/2018  . Elevated PSA 05/27/2018  . Hyperlipidemia   . Hypertension   . COPD (chronic obstructive pulmonary disease) ([place])   . Depression   . Gout   . Alcohol dependency ([place])   . ED (erectile dysfunction)   . Loss of hearing    Past Medical History:  Diagnosis Date  . BPH (benign prostatic hyperplasia)   . COPD (chronic obstructive pulmonary disease) ([place])   . Depression   . ED (erectile dysfunction)   . Gout   . Hyperlipidemia   . Hypertension   . Loss of hearing   . Nocturia   . Paroxysmal atrial fibrillation (HCC)   . Prostate cancer [facility])     Family History  Problem Relation Age of Onset  . Hypertension Mother   . Cancer Father        lung  . Colon cancer Neg Hx   . Inflammatory bowel disease Neg Hx   .  Pancreatic cancer Neg Hx     Past Surgical History:  Procedure Laterality Date  . CARDIOVASCULAR STRESS TEST  12/31/2004   normal perfuction nuclear study w/ no evidence ischemia /  normal LV function and wall motion , ef 52%  . PROSTATECTOMY  2019  . TOTAL HIP ARTHROPLASTY     Social History   Occupational History  . Not on file  Tobacco Use  . Smoking status: Current Every Day Smoker    Types: Cigarettes    Start date: 06/12/1967  . Smokeless tobacco: Never Used  Substance and Sexual Activity  . Alcohol use: Not Currently  . Drug use: Not Currently  . Sexual activity: Not on file

## 2021-03-17 ENCOUNTER — Encounter: Payer: Self-pay | Admitting: Orthopedic Surgery

## 2021-03-22 ENCOUNTER — Ambulatory Visit: Payer: Medicare HMO | Admitting: Orthopaedic Surgery

## 2021-03-22 ENCOUNTER — Telehealth (INDEPENDENT_AMBULATORY_CARE_PROVIDER_SITE_OTHER): Payer: Medicare HMO | Admitting: Cardiology

## 2021-03-22 ENCOUNTER — Encounter: Payer: Self-pay | Admitting: Cardiology

## 2021-03-22 ENCOUNTER — Other Ambulatory Visit: Payer: Self-pay

## 2021-03-22 ENCOUNTER — Encounter: Payer: Self-pay | Admitting: Orthopaedic Surgery

## 2021-03-22 VITALS — Ht 70.0 in | Wt 146.0 lb

## 2021-03-22 VITALS — Ht 70.5 in | Wt 135.0 lb

## 2021-03-22 DIAGNOSIS — I4891 Unspecified atrial fibrillation: Secondary | ICD-10-CM | POA: Diagnosis not present

## 2021-03-22 DIAGNOSIS — G8929 Other chronic pain: Secondary | ICD-10-CM

## 2021-03-22 DIAGNOSIS — F17210 Nicotine dependence, cigarettes, uncomplicated: Secondary | ICD-10-CM

## 2021-03-22 DIAGNOSIS — M25511 Pain in right shoulder: Secondary | ICD-10-CM

## 2021-03-22 DIAGNOSIS — Z7901 Long term (current) use of anticoagulants: Secondary | ICD-10-CM

## 2021-03-22 NOTE — Patient Instructions (Addendum)

## 2021-03-22 NOTE — Progress Notes (Signed)
He saw Dr. Marlou Sa.  I have reviewed the notes.  Patient wants to wait before any surgery.  PROCEDURE NOTE:  The patient request injection, verbal consent was obtained.  The right shoulder was prepped appropriately after time out was performed.   Sterile technique was observed and injection of 1 cc of Celestone 6 mg with several cc's of plain xylocaine. Anesthesia was provided by ethyl chloride and a 20-gauge needle was used to inject the shoulder area. A posterior approach was used.  The injection was tolerated well.  A band aid dressing was applied.  The patient was advised to apply ice later today and tomorrow to the injection sight as needed.  Return in one month.  Call if any problem.  Precautions discussed.   Electronically Signed Sanjuana Kava, MD 5/19/202210:20 AM

## 2021-03-22 NOTE — Progress Notes (Signed)
Virtual Visit via Telephone Note   This visit type was conducted due to national recommendations for restrictions regarding the COVID-19 Pandemic (e.g. social distancing) in an effort to limit this patient's exposure and mitigate transmission in our community.  Due to his co-morbid illnesses, this patient is at least at moderate risk for complications without adequate follow up.  This format is felt to be most appropriate for this patient at this time.  The patient did not have access to video technology/had technical difficulties with video requiring transitioning to audio format only (telephone).  All issues noted in this document were discussed and addressed.  No physical exam could be performed with this format.  Please refer to the patient's chart for his  consent to telehealth for [facility].    Date:  03/22/2021   ID:  Dan [name], DOB [birth year], MRN [id number] The patient was identified using 2 identifiers.  Patient Location: Home Provider Location: Office/Clinic   PCP:  Caryl Bis, MD   [facility] Providers Cardiologist:  Carlyle Dolly, MD     Evaluation Performed:  Follow-Up Visit  Chief Complaint:  Follow for afib, anticoagulation  History of Present Illness:    Dan [name] is a 75 y.o. male seen today for a focused visit to discuss anticoagulation in setting of afib and prior GI bleed. For more detailed history please refer to prior clinic notes.   1. Palpitations/Afib - from prior cardiology notes EKGs have shown PACs, PVCs. Runs of atach.  01/2020 30 day monitor: SR, PACs, PVCs, paroxysms of afib - had been on eliquis, from report heavy brusiing - on eliquis previously, pcp later changed to [place] for what I believe was heavy bruising. - while on xarelto GI bleeding in January with admission to [facility]  - from notes in Jan 2022 admitted with bright red blood per rectum - Hgb 15 prior to admition, on admit 9.4 - CTA showed active  bleeding level of cecum, diffucse thickening of right colon.  - received kcentra for anticoag reversal - found to be cdiff +, started on oral vanc - from notes GI thought bleeding was likely secondary to cdiff colitis - he declined colonoscopy as inpatient, was to f/u with outpatient GI - followed u[p with PA Jodi Mourning, was for outpatient colonscopy with Dr Gala Romney        The patient does not have symptoms concerning for COVID-19 infection (fever, chills, cough, or new shortness of breath).    Past Medical History:  Diagnosis Date  . BPH (benign prostatic hyperplasia)   . COPD (chronic obstructive pulmonary disease) ([facility] [name])   . Depression   . ED (erectile dysfunction)   . Gout   . Hyperlipidemia   . Hypertension   . Loss of hearing   . Nocturia   . Paroxysmal atrial fibrillation (HCC)   . Prostate cancer [name])    Past Surgical History:  Procedure Laterality Date  . CARDIOVASCULAR STRESS TEST  12/31/2004   normal perfuction nuclear study w/ no evidence ischemia /  normal LV function and wall motion , ef 52%  . PROSTATECTOMY  2019  . TOTAL HIP ARTHROPLASTY       No outpatient medications have been marked as taking for the 03/22/21 encounter (Appointment) with Arnoldo Lenis, MD.     Allergies:   Tamsulosin   Social History   Tobacco Use  . Smoking status: Current Every Day Smoker    Types: Cigarettes    Start date:  06/12/1967  . Smokeless tobacco: Never Used  Substance Use Topics  . Alcohol use: Not Currently  . Drug use: Not Currently     Family Hx: The patient's family history includes Cancer in his father; Hypertension in his mother. There is no history of Colon cancer, Inflammatory bowel disease, or Pancreatic cancer.  ROS:   Please see the history of present illness.     All other systems reviewed and are negative.   Prior CV studies:   The following studies were reviewed today:    Labs/Other Tests and Data Reviewed:    EKG:  No ECG  reviewed.  Recent Labs: No results found for requested labs within last 8760 hours.   Recent Lipid Panel No results found for: CHOL, TRIG, HDL, CHOLHDL, LDLCALC, LDLDIRECT  Wt Readings from Last 3 Encounters:  03/22/21 146 lb (66.2 kg)  03/01/21 146 lb (66.2 kg)  02/22/21 136 lb (61.7 kg)     Risk Assessment/Calculations:      Objective:    Vital Signs:   Today's Vitals   03/22/21 1400  Weight: 135 lb (61.2 kg)  Height: 5' 10.5" (1.791 m)   Body mass index is 19.1 kg/m.   ASSESSMENT & PLAN:    1. Afib - off anticoag after admission with GI bleed as reported above - appears it was thought bleed was secondary to active cdiff colitis at the time - will touch base with GI if ok to restart anticoag, if they agree would need a repeat Hgb prior          COVID-19 Education: The signs and symptoms of COVID-19 were discussed with the patient and how to seek care for testing (follow up with PCP or arrange E-visit).  The importance of social distancing was discussed today.  Time:   Today, I have spent 13 minutes with the patient with telehealth technology discussing the above problems.     Medication Adjustments/Labs and Tests Ordered: Current medicines are reviewed at length with the patient today.  Concerns regarding medicines are outlined above.   Tests Ordered: No orders of the defined types were placed in this encounter.   Medication Changes: No orders of the defined types were placed in this encounter.   Follow Up:  In Person in 4 month(s)  Signed, Carlyle Dolly, MD  03/22/2021 12:30 PM    [facility]

## 2021-04-06 ENCOUNTER — Telehealth: Payer: Self-pay | Admitting: *Deleted

## 2021-04-06 DIAGNOSIS — I10 Essential (primary) hypertension: Secondary | ICD-10-CM

## 2021-04-06 NOTE — Telephone Encounter (Signed)
-----   Message from Arnoldo Lenis, MD sent at 04/05/2021  3:35 PM EDT ----- GI feels would be reasonable to retry his blood thinner as long as no recent bleeding. Verify no recent bleeding, if not then please obtain a cbc, if Hgb ok then would start blood thinner   Zandra Abts MD ----- Message ----- From: Roselyn Reef Sent: 03/22/2021   2:45 PM EDT To: Arnoldo Lenis, MD  As long as he is not having overt GI bleeding and hemoglobin has remained stable, I think this is reasonable. We had him scheduled for a colonoscopy, but he cancelled. We have been waiting for him to call back to arrange a follow-up in order to reschedule.   Thanks,  Aliene Altes, PA-C [facility]   ----- Message ----- From: Arnoldo Lenis, MD Sent: 03/22/2021   2:33 PM EDT To: Erenest Rasher, PA-C  Mutual patient. How do you guys feel about [place] restarting his anticoag, I would recheck a Hgb prior.    Zandra Abts MD

## 2021-04-06 NOTE — Telephone Encounter (Signed)
Pt verified that he hasn't had any recent bleeding - cbc ordered and pt wants to get labs done at [facility] - pt wife will come by office to pick up orders prior to labs

## 2021-04-09 ENCOUNTER — Telehealth: Payer: Self-pay | Admitting: Orthopaedic Surgery

## 2021-04-09 MED ORDER — HYDROCODONE-ACETAMINOPHEN 5-325 MG PO TABS: ORAL_TABLET | ORAL | 0 refills | Status: DC

## 2021-04-09 NOTE — Telephone Encounter (Signed)
Patient wife called requesting a refill for her husband pain medicine   HYDROcodone-acetaminophen (NORCO/VICODIN) 5-325    Pharmacy:  [facility]

## 2021-04-10 DIAGNOSIS — I10 Essential (primary) hypertension: Secondary | ICD-10-CM | POA: Diagnosis not present

## 2021-04-19 ENCOUNTER — Ambulatory Visit (INDEPENDENT_AMBULATORY_CARE_PROVIDER_SITE_OTHER): Payer: Medicare HMO | Admitting: Orthopaedic Surgery

## 2021-04-19 ENCOUNTER — Other Ambulatory Visit: Payer: Self-pay

## 2021-04-19 ENCOUNTER — Encounter: Payer: Self-pay | Admitting: Orthopaedic Surgery

## 2021-04-19 DIAGNOSIS — M25511 Pain in right shoulder: Secondary | ICD-10-CM

## 2021-04-19 DIAGNOSIS — G8929 Other chronic pain: Secondary | ICD-10-CM

## 2021-04-19 DIAGNOSIS — F17210 Nicotine dependence, cigarettes, uncomplicated: Secondary | ICD-10-CM

## 2021-04-19 MED ORDER — HYDROCODONE-ACETAMINOPHEN 5-325 MG PO TABS: ORAL_TABLET | ORAL | 0 refills | Status: DC

## 2021-04-19 NOTE — Progress Notes (Signed)
PROCEDURE NOTE:  The patient request injection, verbal consent was obtained.  The right shoulder was prepped appropriately after time out was performed.   Sterile technique was observed and injection of 1 cc of DepoMedrol 40mg  with several cc's of plain xylocaine. Anesthesia was provided by ethyl chloride and a 20-gauge needle was used to inject the shoulder area. A posterior approach was used.  The injection was tolerated well.  A band aid dressing was applied.  The patient was advised to apply ice later today and tomorrow to the injection sight as needed.   I have reviewed the [place] web site prior to prescribing narcotic medicine for this patient.  Return in one month.  Call if any problem.  Precautions discussed.  Electronically Signed Sanjuana Kava, MD 6/16/202210:10 AM

## 2021-04-24 DIAGNOSIS — H25813 Combined forms of age-related cataract, bilateral: Secondary | ICD-10-CM | POA: Diagnosis not present

## 2021-04-24 DIAGNOSIS — H53022 Refractive amblyopia, left eye: Secondary | ICD-10-CM | POA: Diagnosis not present

## 2021-04-27 ENCOUNTER — Telehealth: Payer: Self-pay

## 2021-04-27 MED ORDER — RIVAROXABAN 20 MG PO TABS: 20.0000 mg | ORAL_TABLET | Freq: Every day | ORAL | 3 refills | Status: DC

## 2021-04-27 NOTE — Telephone Encounter (Signed)
Will send prescription for Xarelto 20 mg tablets to pharmacy per Dr. Harl Bowie.

## 2021-04-27 NOTE — Telephone Encounter (Signed)
-----   Message from Arnoldo Lenis, MD sent at 04/27/2021  2:03 PM EDT ----- I dont think the eliquis and falls were connect, no medical reason eliquis would cause falls but reasonable to start xarelto 20mg  daily as alternative. If taking aspirin should stop  Zandra Abts MD

## 2021-04-30 NOTE — Telephone Encounter (Signed)
Contacted patient who verbalized understanding.

## 2021-04-30 NOTE — Telephone Encounter (Signed)
-----   Message from Arnoldo Lenis, MD sent at 04/27/2021  2:03 PM EDT ----- I dont think the eliquis and falls were connect, no medical reason eliquis would cause falls but reasonable to start xarelto 20mg  daily as alternative. If taking aspirin should stop  Zandra Abts MD

## 2021-05-08 ENCOUNTER — Ambulatory Visit: Payer: Medicare HMO | Admitting: Orthopaedic Surgery

## 2021-05-08 ENCOUNTER — Other Ambulatory Visit: Payer: Self-pay

## 2021-05-08 ENCOUNTER — Encounter: Payer: Self-pay | Admitting: Orthopaedic Surgery

## 2021-05-08 ENCOUNTER — Ambulatory Visit: Payer: Medicare HMO

## 2021-05-08 VITALS — BP 162/86 | HR 90 | Ht 70.0 in | Wt 130.8 lb

## 2021-05-08 DIAGNOSIS — G8929 Other chronic pain: Secondary | ICD-10-CM | POA: Diagnosis not present

## 2021-05-08 DIAGNOSIS — M25511 Pain in right shoulder: Secondary | ICD-10-CM

## 2021-05-08 DIAGNOSIS — M25552 Pain in left hip: Secondary | ICD-10-CM

## 2021-05-08 DIAGNOSIS — F17210 Nicotine dependence, cigarettes, uncomplicated: Secondary | ICD-10-CM

## 2021-05-08 DIAGNOSIS — R69 Illness, unspecified: Secondary | ICD-10-CM | POA: Diagnosis not present

## 2021-05-08 MED ORDER — HYDROCODONE-ACETAMINOPHEN 5-325 MG PO TABS: ORAL_TABLET | ORAL | 0 refills | Status: DC

## 2021-05-08 NOTE — Patient Instructions (Signed)
Steps to Quit Smoking Smoking tobacco is the leading cause of preventable death. It can affect almost every organ in the body. Smoking puts you and people around you at risk for many serious, long-lasting (chronic) diseases. Quitting smoking can be hard, but it is one of the best things that you can do for your health. It is never too late to quit. How do I get ready to quit? When you decide to quit smoking, make a plan to help you succeed. Before you quit: Pick a date to quit. Set a date within the next 2 weeks to give you time to prepare. Write down the reasons why you are quitting. Keep this list in places where you will see it often. Tell your family, friends, and co-workers that you are quitting. Their support is important. Talk with your doctor about the choices that may help you quit. Find out if your health insurance will pay for these treatments. Know the people, places, things, and activities that make you want to smoke (triggers). Avoid them. What first steps can I take to quit smoking? Throw away all cigarettes at home, at work, and in your car. Throw away the things that you use when you smoke, such as ashtrays and lighters. Clean your car. Make sure to empty the ashtray. Clean your home, including curtains and carpets. What can I do to help me quit smoking? Talk with your doctor about taking medicines and seeing a counselor at the same time. You are more likely to succeed when you do both. If you are pregnant or breastfeeding, talk with your doctor about counseling or other ways to quit smoking. Do not take medicine to help you quit smoking unless your doctor tells you to do so. To quit smoking: Quit right away Quit smoking totally, instead of slowly cutting back on how much you smoke over a period of time. Go to counseling. You are more likely to quit if you go to counseling sessions regularly. Take medicine You may take medicines to help you quit. Some medicines need a  prescription, and some you can buy over-the-counter. Some medicines may contain a drug called nicotine to replace the nicotine in cigarettes. Medicines may: Help you to stop having the desire to smoke (cravings). Help to stop the problems that come when you stop smoking (withdrawal symptoms). Your doctor may ask you to use: Nicotine patches, gum, or lozenges. Nicotine inhalers or sprays. Non-nicotine medicine that is taken by mouth. Find resources Find resources and other ways to help you quit smoking and remain smoke-free after you quit. These resources are most helpful when you use them often. They include: Online chats with a counselor. Phone quitlines. Printed self-help materials. Support groups or group counseling. Text messaging programs. Mobile phone apps. Use apps on your mobile phone or tablet that can help you stick to your quit plan. There are many free apps for mobile phones and tablets as well as websites. Examples include Quit Guide from the CDC and smokefree.gov  What things can I do to make it easier to quit?  Talk to your family and friends. Ask them to support and encourage you. Call a phone quitline (1-800-QUIT-NOW), reach out to support groups, or work with a counselor. Ask people who smoke to not smoke around you. Avoid places that make you want to smoke, such as: Bars. Parties. Smoke-break areas at work. Spend time with people who do not smoke. Lower the stress in your life. Stress can make you want to   smoke. Try these things to help your stress: Getting regular exercise. Doing deep-breathing exercises. Doing yoga. Meditating. Doing a body scan. To do this, close your eyes, focus on one area of your body at a time from head to toe. Notice which parts of your body are tense. Try to relax the muscles in those areas. How will I feel when I quit smoking? Day 1 to 3 weeks Within the first 24 hours, you may start to have some problems that come from quitting tobacco.  These problems are very bad 2-3 days after you quit, but they do not often last for more than 2-3 weeks. You may get these symptoms: Mood swings. Feeling restless, nervous, angry, or annoyed. Trouble concentrating. Dizziness. Strong desire for high-sugar foods and nicotine. Weight gain. Trouble pooping (constipation). Feeling like you may vomit (nausea). Coughing or a sore throat. Changes in how the medicines that you take for other issues work in your body. Depression. Trouble sleeping (insomnia). Week 3 and afterward After the first 2-3 weeks of quitting, you may start to notice more positive results, such as: Better sense of smell and taste. Less coughing and sore throat. Slower heart rate. Lower blood pressure. Clearer skin. Better breathing. Fewer sick days. Quitting smoking can be hard. Do not give up if you fail the first time. Some people need to try a few times before they succeed. Do your best to stick to your quit plan, and talk with your doctor if you have any questions or concerns. Summary Smoking tobacco is the leading cause of preventable death. Quitting smoking can be hard, but it is one of the best things that you can do for your health. When you decide to quit smoking, make a plan to help you succeed. Quit smoking right away, not slowly over a period of time. When you start quitting, seek help from your doctor, family, or friends. This information is not intended to replace advice given to you by your health care provider. Make sure you discuss any questions you have with your health care provider. Document Revised: 07/16/2019 Document Reviewed: 01/09/2019 Elsevier Patient Education  2022 Elsevier Inc.  

## 2021-05-08 NOTE — Progress Notes (Signed)
I fell again.  He fell on his left hip several days ago while in the yard.  He stepped in a hole he did not see.  He is better and he is using a cane but he says it is still tender.  He had a bipolar hip done by Dr. Alphonzo Cruise in the past.  I saw him last year for a similar fall.  He also has chronic right shoulder pain and was scheduled to be seen next week for that.  I will give injection for that today.  He has chronic pain moving it overhead.  The injections really help.  He has no new trauma to the shoulder.  ROM of the left hip is tender but he has good motion.  He has a limp to the left and uses a cane.  Leg lengths are good.  NV intact.  The right shoulder has painful ROM but nearly full.  NV intact.  Encounter Diagnoses  Name Primary?   Pain in left hip Yes   Chronic pain in right shoulder    Cigarette nicotine dependence without complication    X-rays were done of the left hip, reported separately.  PROCEDURE NOTE:  The patient request injection, verbal consent was obtained.  The right shoulder was prepped appropriately after time out was performed.   Sterile technique was observed and injection of 1 cc of Celestone 6 mg with several cc's of plain xylocaine. Anesthesia was provided by ethyl chloride and a 20-gauge needle was used to inject the shoulder area. A posterior approach was used.  The injection was tolerated well.  A band aid dressing was applied.  The patient was advised to apply ice later today and tomorrow to the injection sight as needed.   I have reviewed the [place] web site prior to prescribing narcotic medicine for this patient.  Return in one month.  Call if any problem.  Precautions discussed.  Electronically Signed Sanjuana Kava, MD 7/5/20229:03 AM

## 2021-05-09 DIAGNOSIS — I739 Peripheral vascular disease, unspecified: Secondary | ICD-10-CM | POA: Diagnosis not present

## 2021-05-09 DIAGNOSIS — M353 Polymyalgia rheumatica: Secondary | ICD-10-CM | POA: Diagnosis not present

## 2021-05-09 DIAGNOSIS — R002 Palpitations: Secondary | ICD-10-CM | POA: Diagnosis not present

## 2021-05-09 DIAGNOSIS — Z125 Encounter for screening for malignant neoplasm of prostate: Secondary | ICD-10-CM | POA: Diagnosis not present

## 2021-05-09 DIAGNOSIS — Z0001 Encounter for general adult medical examination with abnormal findings: Secondary | ICD-10-CM | POA: Diagnosis not present

## 2021-05-09 DIAGNOSIS — I4891 Unspecified atrial fibrillation: Secondary | ICD-10-CM | POA: Diagnosis not present

## 2021-05-09 DIAGNOSIS — J449 Chronic obstructive pulmonary disease, unspecified: Secondary | ICD-10-CM | POA: Diagnosis not present

## 2021-05-09 DIAGNOSIS — Z6820 Body mass index (BMI) 20.0-20.9, adult: Secondary | ICD-10-CM | POA: Diagnosis not present

## 2021-05-09 DIAGNOSIS — M109 Gout, unspecified: Secondary | ICD-10-CM | POA: Diagnosis not present

## 2021-05-09 DIAGNOSIS — Z9189 Other specified personal risk factors, not elsewhere classified: Secondary | ICD-10-CM | POA: Diagnosis not present

## 2021-05-09 DIAGNOSIS — Z13220 Encounter for screening for lipoid disorders: Secondary | ICD-10-CM | POA: Diagnosis not present

## 2021-05-11 DIAGNOSIS — R5383 Other fatigue: Secondary | ICD-10-CM | POA: Diagnosis not present

## 2021-05-11 DIAGNOSIS — R630 Anorexia: Secondary | ICD-10-CM | POA: Diagnosis not present

## 2021-05-15 DIAGNOSIS — H25811 Combined forms of age-related cataract, right eye: Secondary | ICD-10-CM | POA: Diagnosis not present

## 2021-05-17 ENCOUNTER — Ambulatory Visit: Payer: Medicare HMO | Admitting: Orthopaedic Surgery

## 2021-06-05 ENCOUNTER — Ambulatory Visit: Payer: Medicare HMO | Admitting: Orthopaedic Surgery

## 2021-06-05 ENCOUNTER — Telehealth: Payer: Self-pay | Admitting: Orthopaedic Surgery

## 2021-06-05 MED ORDER — HYDROCODONE-ACETAMINOPHEN 5-325 MG PO TABS: ORAL_TABLET | ORAL | 0 refills | Status: DC

## 2021-06-05 NOTE — Telephone Encounter (Signed)
Dan [name] had to cancel his appointment for today because he has tested positive for Covid.  He does request refill on Hydrocodone/Acetaminophen 5-325  mgs. Qty  28  Sig: One tablet every six hours for pain.  Limit 7 days.        [facility]

## 2021-06-18 ENCOUNTER — Telehealth: Payer: Self-pay | Admitting: Orthopaedic Surgery

## 2021-06-18 MED ORDER — HYDROCODONE-ACETAMINOPHEN 5-325 MG PO TABS: ORAL_TABLET | ORAL | 0 refills | Status: DC

## 2021-06-18 NOTE — Telephone Encounter (Signed)
Patient request refill on pain medicine   HYDROcodone-acetaminophen (NORCO/VICODIN) 5-325 MG tablet  [facility] [place]

## 2021-06-26 DIAGNOSIS — M545 Low back pain, unspecified: Secondary | ICD-10-CM | POA: Diagnosis not present

## 2021-06-26 DIAGNOSIS — R300 Dysuria: Secondary | ICD-10-CM | POA: Diagnosis not present

## 2021-06-26 DIAGNOSIS — Z6821 Body mass index (BMI) 21.0-21.9, adult: Secondary | ICD-10-CM | POA: Diagnosis not present

## 2021-06-26 DIAGNOSIS — R69 Illness, unspecified: Secondary | ICD-10-CM | POA: Diagnosis not present

## 2021-07-03 ENCOUNTER — Ambulatory Visit: Payer: Medicare HMO | Admitting: Orthopaedic Surgery

## 2021-07-03 ENCOUNTER — Encounter: Payer: Self-pay | Admitting: Orthopaedic Surgery

## 2021-07-03 ENCOUNTER — Other Ambulatory Visit: Payer: Self-pay

## 2021-07-03 DIAGNOSIS — F17210 Nicotine dependence, cigarettes, uncomplicated: Secondary | ICD-10-CM

## 2021-07-03 DIAGNOSIS — G8929 Other chronic pain: Secondary | ICD-10-CM | POA: Diagnosis not present

## 2021-07-03 DIAGNOSIS — M2550 Pain in unspecified joint: Secondary | ICD-10-CM | POA: Insufficient documentation

## 2021-07-03 DIAGNOSIS — M25511 Pain in right shoulder: Secondary | ICD-10-CM

## 2021-07-03 DIAGNOSIS — R768 Other specified abnormal immunological findings in serum: Secondary | ICD-10-CM | POA: Insufficient documentation

## 2021-07-03 DIAGNOSIS — M1991 Primary osteoarthritis, unspecified site: Secondary | ICD-10-CM | POA: Insufficient documentation

## 2021-07-03 MED ORDER — HYDROCODONE-ACETAMINOPHEN 5-325 MG PO TABS: ORAL_TABLET | ORAL | 0 refills | Status: DC

## 2021-07-03 NOTE — Progress Notes (Signed)
PROCEDURE NOTE:  The patient request injection, verbal consent was obtained.  The right shoulder was prepped appropriately after time out was performed.   Sterile technique was observed and injection of 1 cc of Celestone 6 mg with several cc's of plain xylocaine. Anesthesia was provided by ethyl chloride and a 20-gauge needle was used to inject the shoulder area. A posterior approach was used.  The injection was tolerated well.  A band aid dressing was applied.  The patient was advised to apply ice later today and tomorrow to the injection sight as needed.   Return in six weeks.  Call if any problem.  Precautions discussed.  I have reviewed the [place] web site prior to prescribing narcotic medicine for this patient.  Electronically Signed Sanjuana Kava, MD 8/30/202210:31 AM

## 2021-07-17 ENCOUNTER — Telehealth: Payer: Self-pay | Admitting: Radiology

## 2021-07-17 MED ORDER — HYDROCODONE-ACETAMINOPHEN 5-325 MG PO TABS: ORAL_TABLET | ORAL | 0 refills | Status: DC

## 2021-07-17 NOTE — Telephone Encounter (Signed)
Patient called and requested refill of meds.

## 2021-07-24 ENCOUNTER — Ambulatory Visit: Payer: Medicare HMO | Admitting: Orthopaedic Surgery

## 2021-07-31 ENCOUNTER — Ambulatory Visit: Payer: Medicare HMO | Admitting: Orthopaedic Surgery

## 2021-07-31 ENCOUNTER — Encounter: Payer: Self-pay | Admitting: Orthopaedic Surgery

## 2021-07-31 ENCOUNTER — Other Ambulatory Visit: Payer: Self-pay

## 2021-07-31 DIAGNOSIS — G8929 Other chronic pain: Secondary | ICD-10-CM | POA: Diagnosis not present

## 2021-07-31 DIAGNOSIS — M25511 Pain in right shoulder: Secondary | ICD-10-CM

## 2021-07-31 DIAGNOSIS — F17210 Nicotine dependence, cigarettes, uncomplicated: Secondary | ICD-10-CM

## 2021-07-31 MED ORDER — HYDROCODONE-ACETAMINOPHEN 5-325 MG PO TABS: ORAL_TABLET | ORAL | 0 refills | Status: DC

## 2021-07-31 NOTE — Progress Notes (Signed)
PROCEDURE NOTE:  The patient request injection, verbal consent was obtained.  The right shoulder was prepped appropriately after time out was performed.   Sterile technique was observed and injection of 1 cc of Celestone 6 mg with several cc's of plain xylocaine. Anesthesia was provided by ethyl chloride and a 20-gauge needle was used to inject the shoulder area. A posterior approach was used.  The injection was tolerated well.  A band aid dressing was applied.  The patient was advised to apply ice later today and tomorrow to the injection sight as needed.  Encounter Diagnoses  Name Primary?   Chronic pain in right shoulder Yes   Cigarette nicotine dependence without complication    I refilled his pain medicine.  Call if any problem.  Precautions discussed.  Return in six weeks.  Electronically Signed Sanjuana Kava, MD 9/27/202210:18 AM

## 2021-08-09 ENCOUNTER — Telehealth: Payer: Self-pay | Admitting: Orthopaedic Surgery

## 2021-08-09 MED ORDER — HYDROCODONE-ACETAMINOPHEN 5-325 MG PO TABS: ORAL_TABLET | ORAL | 0 refills | Status: DC

## 2021-08-09 NOTE — Telephone Encounter (Signed)
Hydrocodone-Acetaminophen 5/325 mg  Qty 22 Tablets  [facility] [place]       I called patient and spoke with his wife about her message. I explained to her that we don't take request for pain medication on voicemail and we have to get pain medication requests before the doctor leaves on Thursday, so he can send it in to the pharmacy. I told her that it would be next Tuesday before it will be done. She stated she understood.

## 2021-08-09 NOTE — Telephone Encounter (Signed)
Patient spouse called and left voicemail at 1:45 pm saying we need to call in some pain medicine in for her husband and she wants [place] to call her back today and let her know.    Please call her

## 2021-08-10 ENCOUNTER — Ambulatory Visit: Payer: Medicare HMO | Admitting: Cardiology

## 2021-08-14 ENCOUNTER — Ambulatory Visit: Payer: Medicare HMO | Admitting: Orthopaedic Surgery

## 2021-08-23 ENCOUNTER — Telehealth: Payer: Self-pay | Admitting: Radiology

## 2021-08-23 MED ORDER — HYDROCODONE-ACETAMINOPHEN 5-325 MG PO TABS: ORAL_TABLET | ORAL | 0 refills | Status: DC

## 2021-08-23 NOTE — Telephone Encounter (Signed)
Patient wife called, requesting refill hydrocodone.   [facility] [place].

## 2021-09-10 ENCOUNTER — Telehealth: Payer: Self-pay

## 2021-09-10 MED ORDER — HYDROCODONE-ACETAMINOPHEN 5-325 MG PO TABS: ORAL_TABLET | ORAL | 0 refills | Status: DC

## 2021-09-10 NOTE — Telephone Encounter (Signed)
Hydrocodone-Acetaminophen  5/325 mg  Qty 20 Tablets  [facility] [place]

## 2021-09-11 ENCOUNTER — Ambulatory Visit: Payer: Medicare HMO | Admitting: Orthopaedic Surgery

## 2021-09-18 ENCOUNTER — Encounter: Payer: Self-pay | Admitting: Orthopaedic Surgery

## 2021-09-18 ENCOUNTER — Other Ambulatory Visit: Payer: Self-pay

## 2021-09-18 ENCOUNTER — Ambulatory Visit (INDEPENDENT_AMBULATORY_CARE_PROVIDER_SITE_OTHER): Payer: Medicare HMO | Admitting: Orthopaedic Surgery

## 2021-09-18 DIAGNOSIS — M25511 Pain in right shoulder: Secondary | ICD-10-CM

## 2021-09-18 DIAGNOSIS — G8929 Other chronic pain: Secondary | ICD-10-CM

## 2021-09-18 DIAGNOSIS — F17210 Nicotine dependence, cigarettes, uncomplicated: Secondary | ICD-10-CM

## 2021-09-18 MED ORDER — HYDROCODONE-ACETAMINOPHEN 5-325 MG PO TABS: ORAL_TABLET | ORAL | 0 refills | Status: DC

## 2021-09-18 NOTE — Progress Notes (Signed)
PROCEDURE NOTE:  The patient request injection, verbal consent was obtained.  The right shoulder was prepped appropriately after time out was performed.   Sterile technique was observed and injection of 1 cc of DepoMedrol 40mg  with several cc's of plain xylocaine. Anesthesia was provided by ethyl chloride and a 20-gauge needle was used to inject the shoulder area. A posterior approach was used.  The injection was tolerated well.  A band aid dressing was applied.  The patient was advised to apply ice later today and tomorrow to the injection sight as needed.   Encounter Diagnoses  Name Primary?   Chronic pain in right shoulder Yes   Cigarette nicotine dependence without complication    I have reviewed the [place] web site prior to prescribing narcotic medicine for this patient.  Call if any problem.  Precautions discussed.  Return in two months.  Electronically Signed Sanjuana Kava, MD 11/15/202210:08 AM

## 2021-09-19 ENCOUNTER — Ambulatory Visit: Payer: Medicare HMO | Admitting: Cardiology

## 2021-10-05 DIAGNOSIS — E7849 Other hyperlipidemia: Secondary | ICD-10-CM | POA: Diagnosis not present

## 2021-10-05 DIAGNOSIS — E782 Mixed hyperlipidemia: Secondary | ICD-10-CM | POA: Diagnosis not present

## 2021-10-05 DIAGNOSIS — Z1329 Encounter for screening for other suspected endocrine disorder: Secondary | ICD-10-CM | POA: Diagnosis not present

## 2021-10-05 DIAGNOSIS — Z125 Encounter for screening for malignant neoplasm of prostate: Secondary | ICD-10-CM | POA: Diagnosis not present

## 2021-10-05 DIAGNOSIS — R5383 Other fatigue: Secondary | ICD-10-CM | POA: Diagnosis not present

## 2021-10-05 DIAGNOSIS — I10 Essential (primary) hypertension: Secondary | ICD-10-CM | POA: Diagnosis not present

## 2021-10-09 DIAGNOSIS — I4891 Unspecified atrial fibrillation: Secondary | ICD-10-CM | POA: Diagnosis not present

## 2021-10-09 DIAGNOSIS — M353 Polymyalgia rheumatica: Secondary | ICD-10-CM | POA: Diagnosis not present

## 2021-10-09 DIAGNOSIS — I739 Peripheral vascular disease, unspecified: Secondary | ICD-10-CM | POA: Diagnosis not present

## 2021-10-09 DIAGNOSIS — R002 Palpitations: Secondary | ICD-10-CM | POA: Diagnosis not present

## 2021-10-09 DIAGNOSIS — J449 Chronic obstructive pulmonary disease, unspecified: Secondary | ICD-10-CM | POA: Diagnosis not present

## 2021-10-09 DIAGNOSIS — M109 Gout, unspecified: Secondary | ICD-10-CM | POA: Diagnosis not present

## 2021-10-09 DIAGNOSIS — Z6821 Body mass index (BMI) 21.0-21.9, adult: Secondary | ICD-10-CM | POA: Diagnosis not present

## 2021-10-09 DIAGNOSIS — Z23 Encounter for immunization: Secondary | ICD-10-CM | POA: Diagnosis not present

## 2021-10-24 ENCOUNTER — Telehealth: Payer: Self-pay | Admitting: Orthopaedic Surgery

## 2021-10-24 NOTE — Telephone Encounter (Signed)
Refill request was signed and closed for 10/24/21:   HYDROcodone-acetaminophen (NORCO/VICODIN) 5-325 MG tablet 18 tablet              [facility] [place], [place]

## 2021-10-24 NOTE — Telephone Encounter (Signed)
Patient requests refill - HYDROcodone-acetaminophen (NORCO/VICODIN) 5-325 MG tablet 18 tablet              [facility] [place]

## 2021-10-25 MED ORDER — HYDROCODONE-ACETAMINOPHEN 5-325 MG PO TABS: ORAL_TABLET | ORAL | 0 refills | Status: DC

## 2021-11-14 ENCOUNTER — Ambulatory Visit: Payer: Medicare HMO | Admitting: Cardiology

## 2021-11-20 ENCOUNTER — Ambulatory Visit: Payer: Medicare HMO | Admitting: Orthopaedic Surgery

## 2021-11-29 ENCOUNTER — Other Ambulatory Visit: Payer: Self-pay

## 2021-11-29 ENCOUNTER — Ambulatory Visit: Payer: Medicare HMO | Admitting: Orthopaedic Surgery

## 2021-11-29 ENCOUNTER — Encounter: Payer: Self-pay | Admitting: Orthopaedic Surgery

## 2021-11-29 DIAGNOSIS — G8929 Other chronic pain: Secondary | ICD-10-CM

## 2021-11-29 DIAGNOSIS — M25511 Pain in right shoulder: Secondary | ICD-10-CM | POA: Diagnosis not present

## 2021-11-29 DIAGNOSIS — F17210 Nicotine dependence, cigarettes, uncomplicated: Secondary | ICD-10-CM

## 2021-11-29 MED ORDER — HYDROCODONE-ACETAMINOPHEN 5-325 MG PO TABS: ORAL_TABLET | ORAL | 0 refills | Status: DC

## 2021-11-29 NOTE — Progress Notes (Signed)
PROCEDURE NOTE:  The patient request injection, verbal consent was obtained.  The right shoulder was prepped appropriately after time out was performed.   Sterile technique was observed and injection of 1 cc of DepoMedrol 40mg  with several cc's of plain xylocaine. Anesthesia was provided by ethyl chloride and a 20-gauge needle was used to inject the shoulder area. A posterior approach was used.  The injection was tolerated well.  A band aid dressing was applied.  The patient was advised to apply ice later today and tomorrow to the injection sight as needed.  Encounter Diagnoses  Name Primary?   Chronic pain in right shoulder Yes   Cigarette nicotine dependence without complication    I have reviewed the [place] web site prior to prescribing narcotic medicine for this patient.  Return in two months.  Call if any problem.  Precautions discussed.  Electronically Signed Sanjuana Kava, MD 1/26/202310:06 AM

## 2021-12-06 ENCOUNTER — Telehealth: Payer: Self-pay | Admitting: Orthopaedic Surgery

## 2021-12-06 MED ORDER — HYDROCODONE-ACETAMINOPHEN 5-325 MG PO TABS: ORAL_TABLET | ORAL | 0 refills | Status: DC

## 2021-12-06 NOTE — Telephone Encounter (Signed)
Patient and wife called to relay that [facility] not [facility] his medication in stock; therefore, requests to change to [facility]; states they do have it. Please cancel original prescription per request:  HYDROcodone-acetaminophen (NORCO/VICODIN) 5-325 MG tablet 18 tablet

## 2022-01-29 ENCOUNTER — Other Ambulatory Visit: Payer: Self-pay

## 2022-01-29 ENCOUNTER — Encounter: Payer: Self-pay | Admitting: Orthopaedic Surgery

## 2022-01-29 ENCOUNTER — Ambulatory Visit: Payer: Medicare HMO | Admitting: Orthopaedic Surgery

## 2022-01-29 DIAGNOSIS — G8929 Other chronic pain: Secondary | ICD-10-CM

## 2022-01-29 DIAGNOSIS — M25511 Pain in right shoulder: Secondary | ICD-10-CM | POA: Diagnosis not present

## 2022-01-29 DIAGNOSIS — F17210 Nicotine dependence, cigarettes, uncomplicated: Secondary | ICD-10-CM

## 2022-01-29 MED ORDER — HYDROCODONE-ACETAMINOPHEN 5-325 MG PO TABS: ORAL_TABLET | ORAL | 0 refills | Status: DC

## 2022-01-29 NOTE — Progress Notes (Signed)
PROCEDURE NOTE: ? ?The patient request injection, verbal consent was obtained. ? ?The right shoulder was prepped appropriately after time out was performed.  ? ?Sterile technique was observed and injection of 1 cc of DepoMedrol '40mg'$  with several cc's of plain xylocaine. Anesthesia was provided by ethyl chloride and a 20-gauge needle was used to inject the shoulder area. A posterior approach was used.  The injection was tolerated well. ? ?A band aid dressing was applied. ? ?The patient was advised to apply ice later today and tomorrow to the injection sight as needed. ? ?Encounter Diagnoses  ?Name Primary?  ? Chronic pain in right shoulder Yes  ? Cigarette nicotine dependence without complication   ? ?Return in 10 weeks. ? ?I have reviewed the [place] web site prior to prescribing narcotic medicine for this patient. ? ?Call if any problem. ? ?Precautions discussed. ? ?Electronically Signed ?Sanjuana Kava, MD ?3/28/202310:09 AM ? ?

## 2022-02-01 DIAGNOSIS — Z125 Encounter for screening for malignant neoplasm of prostate: Secondary | ICD-10-CM | POA: Diagnosis not present

## 2022-02-01 DIAGNOSIS — E782 Mixed hyperlipidemia: Secondary | ICD-10-CM | POA: Diagnosis not present

## 2022-02-01 DIAGNOSIS — D519 Vitamin B12 deficiency anemia, unspecified: Secondary | ICD-10-CM | POA: Diagnosis not present

## 2022-02-01 DIAGNOSIS — I10 Essential (primary) hypertension: Secondary | ICD-10-CM | POA: Diagnosis not present

## 2022-02-01 DIAGNOSIS — D649 Anemia, unspecified: Secondary | ICD-10-CM | POA: Diagnosis not present

## 2022-02-01 DIAGNOSIS — J449 Chronic obstructive pulmonary disease, unspecified: Secondary | ICD-10-CM | POA: Diagnosis not present

## 2022-02-01 DIAGNOSIS — D529 Folate deficiency anemia, unspecified: Secondary | ICD-10-CM | POA: Diagnosis not present

## 2022-02-13 ENCOUNTER — Encounter: Payer: Self-pay | Admitting: Cardiology

## 2022-02-13 ENCOUNTER — Ambulatory Visit: Payer: Medicare HMO | Admitting: Cardiology

## 2022-02-13 VITALS — BP 150/75 | Temp 100.0°F | Ht 68.0 in | Wt 136.0 lb

## 2022-02-13 DIAGNOSIS — I4891 Unspecified atrial fibrillation: Secondary | ICD-10-CM

## 2022-02-13 DIAGNOSIS — I10 Essential (primary) hypertension: Secondary | ICD-10-CM

## 2022-02-13 NOTE — Patient Instructions (Signed)
Medication Instructions:  ?Your physician recommends that you continue on your current medications as directed. Please refer to the Current Medication list given to you today.  ? ?Labwork: ?none ? ?Testing/Procedures: ?none ? ?Follow-Up: ?Your physician recommends that you schedule a follow-up appointment in: 6 months ? ?Any Other Special Instructions Will Be Listed Below (If Applicable). ? ?Please call the office on a Friday with a log of your home blood pressure readings. ? ?If you need a refill on your cardiac medications before your next appointment, please call your pharmacy. ? ?

## 2022-02-13 NOTE — Progress Notes (Signed)
? ? ? ?Clinical Summary ?Mr. Vandenbos is a 76 y.o.male ? ?1. Palpitations/Afib ?- from prior cardiology notes EKGs have shown PACs, PVCs. Runs of atach.  ?01/2020 30 day monitor: SR, PACs, PVCs, paroxysms of afib ?- had been on eliquis, from report heavy brusiing ?- on eliquis previously, pcp later changed to xarelto for what I believe was heavy bruising. ?- while on xarelto GI bleeding in January with admission to [facility] ?  ?- from notes in Jan 2022 admitted with bright red blood per rectum ?- Hgb 15 prior to admition, on admit 9.4 ?- CTA showed active bleeding level of cecum, diffucse thickening of right colon.  ?- received kcentra for anticoag reversal ?- found to be cdiff +, started on oral vanc ?- from notes GI thought bleeding was likely secondary to cdiff colitis ?- he declined colonoscopy as inpatient, was to f/u with outpatient GI ?- followed u[p with PA Jodi Mourning, was for outpatient colonscopy with Dr Gala Romney ?  ?- no recent palpitations ?- has not had had colonscopy ?- not in favor of restarting anticoag at this time.  ? ?2. COPD ?  ?3. HTN ?- Reports history of white coat HTN. ?  ?- compliant with meds ?  ?4. Shoulder surgery ?- considering shoulder replacement ?- reports some SOB with walking 1 flight of stairs ?- prior hip replacement, gout limits mobility ?-02/2020 Nuclear stress: inferior infarct, minimal ischemia ?  ?- has not wanted surgery at this time.  ? ?5. Mitral regurg ?- 01/2020 echo LVEF 60-65% ? ?6. AAA screen ?- Jan 2022 CTA [facility] aortic atherosclerosis, no aneurysm ?Past Medical History:  ?Diagnosis Date  ? BPH (benign prostatic hyperplasia)   ? COPD (chronic obstructive pulmonary disease) ([place])   ? Depression   ? ED (erectile dysfunction)   ? Gout   ? Hyperlipidemia   ? Hypertension   ? Loss of hearing   ? Nocturia   ? Paroxysmal atrial fibrillation ([facility])   ? Prostate cancer ([place])   ? ? ? ?Allergies  ?Allergen Reactions  ? Tamsulosin Hives and Itching  ? ? ? ?Current Outpatient Medications   ?Medication Sig Dispense Refill  ? albuterol (PROVENTIL HFA;VENTOLIN HFA) 108 (90 Base) MCG/ACT inhaler Inhale 2 puffs into the lungs every 6 (six) hours as needed for wheezing or shortness of breath.    ? albuterol (PROVENTIL) (2.5 MG/3ML) 0.083% nebulizer solution Take 2.5 mg by nebulization every 6 (six) hours as needed.    ? allopurinol (ZYLOPRIM) 100 MG tablet Take 100 mg by mouth daily.    ? Ascorbic Acid (VITAMIN C) 1000 MG tablet Take 1,000 mg by mouth daily.    ? aspirin EC 81 MG tablet Take 81 mg by mouth daily. Swallow whole.    ? cholecalciferol (VITAMIN D3) 25 MCG (1000 UNIT) tablet Take 1,000 Units by mouth daily.    ? colchicine 0.6 MG tablet Take 1 tablet by mouth daily as needed.    ? dicyclomine (BENTYL) 10 MG capsule Take 1 capsule (10 mg total) by mouth daily before breakfast.  You may increase this to twice daily if needed. Hold in the setting of constipation. (Patient taking differently: Take 1 capsule (10 mg total) by mouth daily before breakfast.  You may increase this to twice daily if needed. Hold in the setting of constipation.) 60 capsule 2  ? diltiazem (TIAZAC) 360 MG 24 hr capsule Take 360 mg by mouth daily.    ? HYDROcodone-acetaminophen (NORCO/VICODIN) 5-325 MG tablet One tablet  every six hours for pain.  Limit 7 days. 20 tablet 0  ? potassium chloride (MICRO-K) 10 MEQ CR capsule TAKE 2 CAPSULES BY MOUTH DAILY. 7 capsule 0  ? Probiotic Product (PROBIOTIC DAILY PO) Take by mouth.    ? TRELEGY ELLIPTA 200-62.5-25 MCG/ACT AEPB Inhale 1 puff into the lungs daily.    ? Zinc 50 MG TABS Take 1 tablet by mouth daily.    ? ?No current facility-administered medications for this visit.  ? ? ? ?Past Surgical History:  ?Procedure Laterality Date  ? CARDIOVASCULAR STRESS TEST  12/31/2004  ? normal perfuction nuclear study w/ no evidence ischemia /  normal LV function and wall motion , ef 52%  ? PROSTATECTOMY  2019  ? TOTAL HIP ARTHROPLASTY    ? ? ? ?Allergies  ?Allergen Reactions  ? Tamsulosin  Hives and Itching  ? ? ? ? ?Family History  ?Problem Relation Age of Onset  ? Hypertension Mother   ? Cancer Father   ?     lung  ? Colon cancer Neg Hx   ? Inflammatory bowel disease Neg Hx   ? Pancreatic cancer Neg Hx   ? ? ? ?Social History ?Mr. Kropp reports that he has been smoking cigarettes. He started smoking about 54 years ago. He has never used smokeless tobacco. ?Mr. Buttacavoli reports that he does not currently use alcohol. ? ? ?Review of Systems ?CONSTITUTIONAL: No weight loss, fever, chills, weakness or fatigue.  ?HEENT: Eyes: No visual loss, blurred vision, double vision or yellow sclerae.No hearing loss, sneezing, congestion, runny nose or sore throat.  ?SKIN: No rash or itching.  ?CARDIOVASCULAR: per hpi ?RESPIRATORY: No shortness of breath, cough or sputum.  ?GASTROINTESTINAL: No anorexia, nausea, vomiting or diarrhea. No abdominal pain or blood.  ?GENITOURINARY: No burning on urination, no polyuria ?NEUROLOGICAL: No headache, dizziness, syncope, paralysis, ataxia, numbness or tingling in the extremities. No change in bowel or bladder control.  ?MUSCULOSKELETAL: No muscle, back pain, joint pain or stiffness.  ?LYMPHATICS: No enlarged nodes. No history of splenectomy.  ?PSYCHIATRIC: No history of depression or anxiety.  ?ENDOCRINOLOGIC: No reports of sweating, cold or heat intolerance. No polyuria or polydipsia.  ?. ? ? ?Physical Examination ?Today's Vitals  ? 02/13/22 1050 02/13/22 1139  ?BP: (!) 150/70 (!) 150/75  ?Temp: 100 ?F (37.8 ?C)   ?SpO2: 98%   ?Weight: 136 lb (61.7 kg)   ?Height: '5\' 8"'$  (1.727 m)   ? ?Body mass index is 20.68 kg/m?. ? ?Gen: resting comfortably, no acute distress ?HEENT: no scleral icterus, pupils equal round and reactive, no palptable cervical adenopathy,  ?CV: RRR, no m/r/g no jvd ?Resp: Clear to auscultation bilaterally ?GI: abdomen is soft, non-tender, non-distended, normal bowel sounds, no hepatosplenomegaly ?MSK: extremities are warm, no edema.  ?Skin: warm, no rash ?Neuro:   no focal deficits ?Psych: appropriate affect ? ? ?Diagnostic Studies ? ? ? ? ?Assessment and Plan  ? ?1. Afib ?- off anticoag after admission with GI bleed as reported above ?- appears it was thought bleed was secondary to active cdiff colitis at the time ?-he was to have outpatient colonscopy but cancelled ?- no recurrent bleeding, at this time he does not want to retry xarelto.  ?- EKG today shows NSR ? ?2. HTN ?- history of white coat HTN, bp's elevated here, he will call us with home bp's Friday ?  ? ? ? ?Arnoldo Lenis, M.D ?

## 2022-02-21 ENCOUNTER — Telehealth: Payer: Self-pay | Admitting: Cardiology

## 2022-02-21 DIAGNOSIS — I10 Essential (primary) hypertension: Secondary | ICD-10-CM

## 2022-02-21 NOTE — Telephone Encounter (Signed)
Patient's wife calling with BP readings: ? ?130/64 ?150/70 ?140/70 ?145/70 ?140/70 ? ? ? ?

## 2022-02-25 NOTE — Telephone Encounter (Signed)
BP's too high, can he start losartan '25mg'$  daily. Update [place] on bp's in 2 weeks, needs bmet 2 weeks ? ? ?Zandra Abts MD ?

## 2022-02-27 NOTE — Telephone Encounter (Signed)
Left message to call office

## 2022-03-04 MED ORDER — LOSARTAN POTASSIUM 25 MG PO TABS: 25.0000 mg | ORAL_TABLET | Freq: Every day | ORAL | 1 refills | Status: DC

## 2022-03-04 NOTE — Telephone Encounter (Signed)
Wife made aware. States that the patient has been on prednisone and receiving steroid injections for right shoulder pain for the past year and does not know if this is something that can be causing his blood pressure to go up. ? ?States that she will come by the [facility] office in the morning to pick up orders to have labs at [facility]. ?

## 2022-03-05 DIAGNOSIS — M109 Gout, unspecified: Secondary | ICD-10-CM | POA: Diagnosis not present

## 2022-03-05 DIAGNOSIS — Z6821 Body mass index (BMI) 21.0-21.9, adult: Secondary | ICD-10-CM | POA: Diagnosis not present

## 2022-03-05 DIAGNOSIS — M353 Polymyalgia rheumatica: Secondary | ICD-10-CM | POA: Diagnosis not present

## 2022-03-05 DIAGNOSIS — Z9189 Other specified personal risk factors, not elsewhere classified: Secondary | ICD-10-CM | POA: Diagnosis not present

## 2022-03-05 DIAGNOSIS — R002 Palpitations: Secondary | ICD-10-CM | POA: Diagnosis not present

## 2022-03-05 DIAGNOSIS — I739 Peripheral vascular disease, unspecified: Secondary | ICD-10-CM | POA: Diagnosis not present

## 2022-03-05 DIAGNOSIS — R300 Dysuria: Secondary | ICD-10-CM | POA: Diagnosis not present

## 2022-03-05 DIAGNOSIS — I4891 Unspecified atrial fibrillation: Secondary | ICD-10-CM | POA: Diagnosis not present

## 2022-03-05 DIAGNOSIS — D692 Other nonthrombocytopenic purpura: Secondary | ICD-10-CM | POA: Diagnosis not present

## 2022-03-05 DIAGNOSIS — J449 Chronic obstructive pulmonary disease, unspecified: Secondary | ICD-10-CM | POA: Diagnosis not present

## 2022-03-06 DIAGNOSIS — R69 Illness, unspecified: Secondary | ICD-10-CM | POA: Diagnosis not present

## 2022-03-06 DIAGNOSIS — R319 Hematuria, unspecified: Secondary | ICD-10-CM | POA: Diagnosis not present

## 2022-03-20 ENCOUNTER — Telehealth: Payer: Self-pay | Admitting: Cardiology

## 2022-03-20 NOTE — Telephone Encounter (Signed)
Patient lab order got misplace, wife is calling to see if she could come by the office to pick up a new. Please advise  ?

## 2022-03-20 NOTE — Telephone Encounter (Signed)
Advised that copy of lab order will be available for pick up. Says will be dropping off BP readings tomorrow. ?

## 2022-03-28 ENCOUNTER — Ambulatory Visit: Payer: Medicare HMO | Admitting: Urology

## 2022-03-28 ENCOUNTER — Encounter: Payer: Self-pay | Admitting: Urology

## 2022-03-28 VITALS — BP 172/71 | HR 105

## 2022-03-28 DIAGNOSIS — R9721 Rising PSA following treatment for malignant neoplasm of prostate: Secondary | ICD-10-CM

## 2022-03-28 DIAGNOSIS — R8289 Other abnormal findings on cytological and histological examination of urine: Secondary | ICD-10-CM | POA: Diagnosis not present

## 2022-03-28 DIAGNOSIS — C61 Malignant neoplasm of prostate: Secondary | ICD-10-CM

## 2022-03-28 DIAGNOSIS — R310 Gross hematuria: Secondary | ICD-10-CM

## 2022-03-28 LAB — URINALYSIS, ROUTINE W REFLEX MICROSCOPIC
Bilirubin, UA: NEGATIVE
Glucose, UA: NEGATIVE
Ketones, UA: NEGATIVE
Leukocytes,UA: NEGATIVE
Nitrite, UA: NEGATIVE
Protein,UA: NEGATIVE
RBC, UA: NEGATIVE
Specific Gravity, UA: 1.025 (ref 1.005–1.030)
Urobilinogen, Ur: 0.2 mg/dL (ref 0.2–1.0)
pH, UA: 6 (ref 5.0–7.5)

## 2022-03-28 NOTE — Progress Notes (Signed)
Subjective: 1. Gross hematuria   2. Urinary cytology positive   3. Prostate cancer ([place])   4. Rising PSA following treatment for malignant neoplasm of prostate      Consult requested by Dr. Gar Ponto   Dan [name] is a 76 yo male with a history of prostate cancer that was bulky on the left with Gleason 8 disease on biospy for a PSA of 12.8.  Prostate volume was 41m.  He was treated with RALP with Dr. RDorothyann Pengin 2019 and was found to have T3b N0 M0 Gleason 7(4+3) prostate cancer on surgical path with a nadir PSA post op of 2.7 and a subsequent rise to 5.7 by 05/12/19 and an AAddisonPET that only showed prostate bed uptake.  He then had radiation therapy by Dr. MCharlotta Newtonfor a PSA recurrence in 2020.  He is referred now with hematuria and a cytology that showed cells suspicious for high grade malignancy.  He has a smoker.  His Cr was 1.14 on 02/01/22 and his PSA was 2.2.  It had gotten down to 0.1 after the radiation therapy.  He only had one episode of hematuria the day he saw Dr. DQuillian Quince  His UA is clear today.   He is on only an aspirin for his a fib. He has only rare incontinence.  He has post prostatectomy ED.  ROS:  Review of Systems  Respiratory:  Negative for shortness of breath.   Musculoskeletal:  Positive for joint pain (right shoulder).  All other systems reviewed and are negative.  Allergies  Allergen Reactions   Tamsulosin Hives and Itching    Past Medical History:  Diagnosis Date   BPH (benign prostatic hyperplasia)    COPD (chronic obstructive pulmonary disease) (HCC)    Depression    ED (erectile dysfunction)    Gout    Hyperlipidemia    Hypertension    Loss of hearing    Nocturia    Paroxysmal atrial fibrillation (HCC)    Prostate cancer ([facility]     Past Surgical History:  Procedure Laterality Date   CARDIOVASCULAR STRESS TEST  12/31/2004   normal perfuction nuclear study w/ no evidence ischemia /  normal LV function and wall motion , ef 52%   PROSTATECTOMY  2019    TOTAL HIP ARTHROPLASTY      Social History   Socioeconomic History   Marital status: Single    Spouse name: Not on file   Number of children: Not on file   Years of education: Not on file   Highest education level: Not on file  Occupational History   Not on file  Tobacco Use   Smoking status: Every Day    Types: Cigarettes    Start date: 06/12/1967   Smokeless tobacco: Never  Vaping Use   Vaping Use: Never used  Substance and Sexual Activity   Alcohol use: Not Currently   Drug use: Not Currently   Sexual activity: Not on file  Other Topics Concern   Not on file  Social History Narrative   Not on file   Social Determinants of Health   Financial Resource Strain: Not on file  Food Insecurity: Not on file  Transportation Needs: Not on file  Physical Activity: Not on file  Stress: Not on file  Social Connections: Not on file  Intimate Partner Violence: Not on file    Family History  Problem Relation Age of Onset   Hypertension Mother    Cancer Father  lung   Colon cancer Neg Hx    Inflammatory bowel disease Neg Hx    Pancreatic cancer Neg Hx     Anti-infectives: Anti-infectives (From admission, onward)    None       Current Outpatient Medications  Medication Sig Dispense Refill   albuterol (PROVENTIL HFA;VENTOLIN HFA) 108 (90 Base) MCG/ACT inhaler Inhale 2 puffs into the lungs every 6 (six) hours as needed for wheezing or shortness of breath.     albuterol (PROVENTIL) (2.5 MG/3ML) 0.083% nebulizer solution Take 2.5 mg by nebulization every 6 (six) hours as needed.     allopurinol (ZYLOPRIM) 100 MG tablet Take 100 mg by mouth daily.     Ascorbic Acid (VITAMIN C) 1000 MG tablet Take 1,000 mg by mouth daily.     aspirin EC 81 MG tablet Take 81 mg by mouth daily. Swallow whole.     cholecalciferol (VITAMIN D3) 25 MCG (1000 UNIT) tablet Take 1,000 Units by mouth daily.     colchicine 0.6 MG tablet Take 1 tablet by mouth daily as needed.     dicyclomine  (BENTYL) 10 MG capsule Take 1 capsule (10 mg total) by mouth daily before breakfast.  You may increase this to twice daily if needed. Hold in the setting of constipation. (Patient taking differently: Take 1 capsule (10 mg total) by mouth daily before breakfast.  You may increase this to twice daily if needed. Hold in the setting of constipation.) 60 capsule 2   diltiazem (TIAZAC) 360 MG 24 hr capsule Take 360 mg by mouth daily.     HYDROcodone-acetaminophen (NORCO/VICODIN) 5-325 MG tablet One tablet every six hours for pain.  Limit 7 days. 20 tablet 0   losartan (COZAAR) 25 MG tablet Take 1 tablet (25 mg total) by mouth daily. 90 tablet 1   potassium chloride (MICRO-K) 10 MEQ CR capsule TAKE 2 CAPSULES BY MOUTH DAILY. 7 capsule 0   Probiotic Product (PROBIOTIC DAILY PO) Take by mouth.     TRELEGY ELLIPTA 200-62.5-25 MCG/ACT AEPB Inhale 1 puff into the lungs daily.     Zinc 50 MG TABS Take 1 tablet by mouth daily.     diltiazem (CARDIZEM CD) 360 MG 24 hr capsule Take by mouth daily.     folic acid (FOLVITE) 1 MG tablet Take 1 mg by mouth daily.     No current facility-administered medications for this visit.     Objective: Vital signs in last 24 hours: BP (!) 172/71   Pulse (!) 105   Intake/Output from previous day: No intake/output data recorded. Intake/Output this shift: '@IOTHISSHIFT'$ @   Physical Exam  Lab Results:  Results for orders placed or performed in visit on 03/28/22 (from the past 24 hour(s))  Urinalysis, Routine w reflex microscopic     Status: None   Collection Time: 03/28/22 11:17 AM  Result Value Ref Range   Specific Gravity, UA 1.025 1.005 - 1.030   pH, UA 6.0 5.0 - 7.5   Color, UA Yellow Yellow   Appearance Ur Clear Clear   Leukocytes,UA Negative Negative   Protein,UA Negative Negative/Trace   Glucose, UA Negative Negative   Ketones, UA Negative Negative   RBC, UA Negative Negative   Bilirubin, UA Negative Negative   Urobilinogen, Ur 0.2 0.2 - 1.0 mg/dL    Nitrite, UA Negative Negative   Microscopic Examination Comment    Narrative   Performed at:  [facility] [address], [place], [place]  [id number] Lab Director: Mina Marble  [place], Phone:  [phone]    BMET No results for input(s): NA, K, CL, CO2, GLUCOSE, BUN, CREATININE, CALCIUM in the last 72 hours. PT/INR No results for input(s): LABPROT, INR in the last 72 hours. ABG No results for input(s): PHART, HCO3 in the last 72 hours.  Invalid input(s): PCO2, PO2 Cytology reviewed.  CMP, CBC and PSA from 02/01/22 reviewed.     PSA's from 2022 reviewed.    UA is clear.  Studies/Results: Extensive records from Care Everywhere reviewed including notes from Dr. Dorothyann Peng and Dr. Charlotta Newton.  Referral records from Dr. Quillian Quince reviewed.     CTA A/P for GI bleed from 11/11/20 reviewed.  Nothing to suggest prostate mets.  6.2cm right renal cyst noted.     Assessment/Plan: Hematuria with positive cytology.   He will need a CT hematuria study and cystoscopy.  Prostate cancer with rising PSA s/p RALP and salvage radiation therapy.  He will need a bone scan in addition to the CT.    No orders of the defined types were placed in this encounter.    Orders Placed This Encounter  Procedures   CT HEMATURIA WORKUP    Standing Status:   Future    Standing Expiration Date:   06/28/2022    Scheduling Instructions:     I will have him return next week for cystoscopy and need it done prior.    Order Specific Question:   Reason for Exam (SYMPTOM  OR DIAGNOSIS REQUIRED)    Answer:   gross hematuria and positive cytology    Order Specific Question:   Preferred imaging location?    Answer:   [facility]    Order Specific Question:   Radiology Contrast Protocol - do NOT remove file path    Answer:   [email or web address]   NM Bone Scan Whole Body    Not stat but please do ASAP    Standing Status:   Future    Standing Expiration Date:   03/29/2023     Order Specific Question:   If indicated for the ordered procedure, I authorize the administration of a radiopharmaceutical per Radiology protocol    Answer:   Yes    Order Specific Question:   Preferred imaging location?    Answer:   [facility]    Order Specific Question:   Radiology Contrast Protocol - do NOT remove file path    Answer:   [email or web address]   Urinalysis, Routine w reflex microscopic   Basic metabolic panel   PSA     Return in about 1 week (around 04/04/2022) for with CT results for cystoscopy..    CC: Dr. Gar Ponto.      Irine Seal 03/29/2022 [phone]

## 2022-03-29 LAB — BASIC METABOLIC PANEL
BUN/Creatinine Ratio: 17 (ref 10–24)
BUN: 16 mg/dL (ref 8–27)
CO2: 19 mmol/L — ABNORMAL LOW (ref 20–29)
Calcium: 10.2 mg/dL (ref 8.6–10.2)
Chloride: 101 mmol/L (ref 96–106)
Creatinine, Ser: 0.93 mg/dL (ref 0.76–1.27)
Glucose: 104 mg/dL — ABNORMAL HIGH (ref 70–99)
Potassium: 4.8 mmol/L (ref 3.5–5.2)
Sodium: 139 mmol/L (ref 134–144)
eGFR: 86 mL/min/{1.73_m2} (ref >59)

## 2022-03-29 LAB — PSA: Prostate Specific Ag, Serum: 2.2 ng/mL (ref 0.0–4.0)

## 2022-03-29 NOTE — Progress Notes (Signed)
Aware via letter.

## 2022-04-02 ENCOUNTER — Telehealth: Payer: Self-pay | Admitting: *Deleted

## 2022-04-02 NOTE — Telephone Encounter (Signed)
-----   Message from Arnoldo Lenis, MD sent at 03/29/2022  4:02 PM EDT ----- BP log reviewed, overall bp's look ok , no changes   Zandra Abts MD

## 2022-04-02 NOTE — Telephone Encounter (Signed)
Left message to return call 

## 2022-04-03 ENCOUNTER — Encounter (HOSPITAL_COMMUNITY)
Admission: RE | Admit: 2022-04-03 | Discharge: 2022-04-03 | Disposition: A | Discharge status: Home / Self Care | Payer: Medicare HMO | Source: Ambulatory Visit | Attending: Urology | Admitting: Urology

## 2022-04-03 ENCOUNTER — Ambulatory Visit (HOSPITAL_COMMUNITY)
Admission: RE | Admit: 2022-04-03 | Discharge: 2022-04-03 | Disposition: A | Discharge status: Home / Self Care | Payer: Medicare HMO | Source: Ambulatory Visit | Attending: Urology | Admitting: Urology

## 2022-04-03 DIAGNOSIS — N2889 Other specified disorders of kidney and ureter: Secondary | ICD-10-CM | POA: Diagnosis not present

## 2022-04-03 DIAGNOSIS — M160 Bilateral primary osteoarthritis of hip: Secondary | ICD-10-CM | POA: Diagnosis not present

## 2022-04-03 DIAGNOSIS — C61 Malignant neoplasm of prostate: Secondary | ICD-10-CM

## 2022-04-03 DIAGNOSIS — R9721 Rising PSA following treatment for malignant neoplasm of prostate: Secondary | ICD-10-CM

## 2022-04-03 DIAGNOSIS — R310 Gross hematuria: Secondary | ICD-10-CM | POA: Insufficient documentation

## 2022-04-03 DIAGNOSIS — M19011 Primary osteoarthritis, right shoulder: Secondary | ICD-10-CM | POA: Diagnosis not present

## 2022-04-03 DIAGNOSIS — Z8546 Personal history of malignant neoplasm of prostate: Secondary | ICD-10-CM | POA: Diagnosis not present

## 2022-04-03 MED ORDER — TECHNETIUM TC 99M MEDRONATE IV KIT
20.0000 | PACK | Freq: Once | INTRAVENOUS | Status: AC | PRN
  Administered 2022-04-03: 19.8 via INTRAVENOUS

## 2022-04-03 MED ORDER — IOHEXOL 350 MG/ML SOLN
100.0000 mL | Freq: Once | INTRAVENOUS | Status: AC | PRN
Start: 2022-04-03 — End: 2022-04-03
  Administered 2022-04-03: 100 mL via INTRAVENOUS

## 2022-04-04 ENCOUNTER — Ambulatory Visit (INDEPENDENT_AMBULATORY_CARE_PROVIDER_SITE_OTHER): Payer: Medicare HMO | Admitting: Urology

## 2022-04-04 ENCOUNTER — Encounter: Payer: Self-pay | Admitting: Urology

## 2022-04-04 VITALS — BP 159/78 | HR 101

## 2022-04-04 DIAGNOSIS — Z8546 Personal history of malignant neoplasm of prostate: Secondary | ICD-10-CM | POA: Diagnosis not present

## 2022-04-04 DIAGNOSIS — R310 Gross hematuria: Secondary | ICD-10-CM

## 2022-04-04 DIAGNOSIS — R8289 Other abnormal findings on cytological and histological examination of urine: Secondary | ICD-10-CM

## 2022-04-04 DIAGNOSIS — C61 Malignant neoplasm of prostate: Secondary | ICD-10-CM

## 2022-04-04 DIAGNOSIS — R9721 Rising PSA following treatment for malignant neoplasm of prostate: Secondary | ICD-10-CM

## 2022-04-04 MED ORDER — CIPROFLOXACIN HCL 500 MG PO TABS
500.0000 mg | ORAL_TABLET | Freq: Once | ORAL | Status: AC
  Administered 2022-04-04: 500 mg via ORAL

## 2022-04-04 NOTE — Progress Notes (Unsigned)
Subjective: 1. Gross hematuria   2. Urinary cytology positive   3. Prostate cancer ([place])   4. Rising PSA following treatment for malignant neoplasm of prostate      03/28/22: Dan [name] is a 76 yo male with a history of prostate cancer that was bulky on the left with Gleason 8 disease on biospy for a PSA of 12.8.  Prostate volume was 70m.  He was treated with RALP with Dr. RDorothyann Pengin 2019 and was found to have T3b N0 M0 Gleason 7(4+3) prostate cancer on surgical path with a nadir PSA post op of 2.7 and a subsequent rise to 5.7 by 05/12/19 and an ARedbyPET that only showed prostate bed uptake.  He then had radiation therapy by Dr. MCharlotta Newtonfor a PSA recurrence in 2020.  He is referred now with hematuria and a cytology that showed cells suspicious for high grade malignancy.  He has a smoker.  His Cr was 1.14 on 02/01/22 and his PSA was 2.2.  It had gotten down to 0.1 after the radiation therapy.  He only had one episode of hematuria the day he saw Dr. DQuillian Quince  His UA is clear today.   He is on only an aspirin for his a fib. He has only rare incontinence.  He has post prostatectomy ED.   04/04/22: RNishanthreturns today in f/u for his history of gross hematuria and prostate cancer with a rising PSA post prostatectomy.   The CT hematuria study showed possible bladder wall thickening and on  my review there was a possible lesion at the left bladder base.  He had no evidence of prostate cancer mets on CT but there is uptake in a left posterior rib on bone scan.  He does report prior rib fractures.  He has some right shoulder uptake as well that is probably arthritis.  He has had no more hematuria.    ROS:  Review of Systems  Respiratory:  Negative for shortness of breath.   Musculoskeletal:  Positive for joint pain (right shoulder).  All other systems reviewed and are negative.  Allergies  Allergen Reactions   Tamsulosin Hives and Itching    Past Medical History:  Diagnosis Date   BPH (benign prostatic  hyperplasia)    COPD (chronic obstructive pulmonary disease) (HCC)    Depression    ED (erectile dysfunction)    Gout    Hyperlipidemia    Hypertension    Loss of hearing    Nocturia    Paroxysmal atrial fibrillation (HCC)    Prostate cancer ([facility]     Past Surgical History:  Procedure Laterality Date   CARDIOVASCULAR STRESS TEST  12/31/2004   normal perfuction nuclear study w/ no evidence ischemia /  normal LV function and wall motion , ef 52%   PROSTATECTOMY  2019   TOTAL HIP ARTHROPLASTY      Social History   Socioeconomic History   Marital status: Married    Spouse name: Not on file   Number of children: Not on file   Years of education: Not on file   Highest education level: Not on file  Occupational History   Not on file  Tobacco Use   Smoking status: Every Day    Types: Cigarettes    Start date: 06/12/1967   Smokeless tobacco: Never  Vaping Use   Vaping Use: Never used  Substance and Sexual Activity   Alcohol use: Not Currently   Drug use: Not Currently   Sexual activity: Not on  file  Other Topics Concern   Not on file  Social History Narrative   Not on file   Social Determinants of Health   Financial Resource Strain: Not on file  Food Insecurity: Not on file  Transportation Needs: Not on file  Physical Activity: Not on file  Stress: Not on file  Social Connections: Not on file  Intimate Partner Violence: Not on file    Family History  Problem Relation Age of Onset   Hypertension Mother    Cancer Father        lung   Colon cancer Neg Hx    Inflammatory bowel disease Neg Hx    Pancreatic cancer Neg Hx     Anti-infectives: Anti-infectives (From admission, onward)    Start     Dose/Rate Route Frequency Ordered Stop   04/04/22 1415  CIPROFLOXACIN HCL 500 MG PO TABS        500 mg Oral  Once 04/04/22 1410 04/04/22 1423       Current Outpatient Medications  Medication Sig Dispense Refill   albuterol (PROVENTIL HFA;VENTOLIN HFA) 108 (90  Base) MCG/ACT inhaler Inhale 2 puffs into the lungs every 6 (six) hours as needed for wheezing or shortness of breath.     albuterol (PROVENTIL) (2.5 MG/3ML) 0.083% nebulizer solution Take 2.5 mg by nebulization every 6 (six) hours as needed.     allopurinol (ZYLOPRIM) 100 MG tablet Take 100 mg by mouth daily.     Ascorbic Acid (VITAMIN C) 1000 MG tablet Take 1,000 mg by mouth daily.     aspirin EC 81 MG tablet Take 81 mg by mouth daily. Swallow whole.     cholecalciferol (VITAMIN D3) 25 MCG (1000 UNIT) tablet Take 1,000 Units by mouth daily.     colchicine 0.6 MG tablet Take 1 tablet by mouth daily as needed.     dicyclomine (BENTYL) 10 MG capsule Take 1 capsule (10 mg total) by mouth daily before breakfast.  You may increase this to twice daily if needed. Hold in the setting of constipation. (Patient taking differently: Take 1 capsule (10 mg total) by mouth daily before breakfast.  You may increase this to twice daily if needed. Hold in the setting of constipation.) 60 capsule 2   diltiazem (CARDIZEM CD) 360 MG 24 hr capsule Take by mouth daily.     diltiazem (TIAZAC) 360 MG 24 hr capsule Take 360 mg by mouth daily.     folic acid (FOLVITE) 1 MG tablet Take 1 mg by mouth daily.     HYDROcodone-acetaminophen (NORCO/VICODIN) 5-325 MG tablet One tablet every six hours for pain.  Limit 7 days. 20 tablet 0   losartan (COZAAR) 25 MG tablet Take 1 tablet (25 mg total) by mouth daily. 90 tablet 1   potassium chloride (MICRO-K) 10 MEQ CR capsule TAKE 2 CAPSULES BY MOUTH DAILY. 7 capsule 0   Probiotic Product (PROBIOTIC DAILY PO) Take by mouth.     TRELEGY ELLIPTA 200-62.5-25 MCG/ACT AEPB Inhale 1 puff into the lungs daily.     Zinc 50 MG TABS Take 1 tablet by mouth daily.     No current facility-administered medications for this visit.     Objective: Vital signs in last 24 hours: BP (!) 159/78   Pulse (!) 101   Intake/Output from previous day: No intake/output data recorded. Intake/Output this  shift: '@IOTHISSHIFT'$ @   Physical Exam  Lab Results:  Results for orders placed or performed in visit on 04/04/22 (from the past 24 hour(s))  Urinalysis, Routine w reflex microscopic     Status: Abnormal   Collection Time: 04/04/22  1:43 PM  Result Value Ref Range   Specific Gravity, UA <1.005 (L) 1.005 - 1.030   pH, UA 0.0 (LL) 5.0 - 7.5   Color, UA Yellow Yellow   Appearance Ur Clear Clear   Leukocytes,UA Negative Negative   Protein,UA Negative Negative/Trace   Glucose, UA Negative Negative   Ketones, UA Negative Negative   RBC, UA Negative Negative   Bilirubin, UA Negative Negative   Urobilinogen, Ur 0.2 0.2 - 1.0 mg/dL   Nitrite, UA Negative Negative   Microscopic Examination Comment    Narrative   Performed at:  [facility] [address] [place], [place]  [id number] Lab Director: [place], Phone:  [phone]     BMET No results for input(s): NA, K, CL, CO2, GLUCOSE, BUN, CREATININE, CALCIUM in the last 72 hours. PT/INR No results for input(s): LABPROT, INR in the last 72 hours. ABG No results for input(s): PHART, HCO3 in the last 72 hours.  Invalid input(s): PCO2, PO2 Cytology reviewed.  CMP, CBC and PSA from 02/01/22 reviewed.     PSA's from 2022 reviewed.    UA is clear.  Studies/Results: NM Bone Scan Whole Body  Result Date: 04/04/2022 CLINICAL DATA:  Prostate cancer, rising PSA EXAM: NUCLEAR MEDICINE WHOLE BODY BONE SCAN TECHNIQUE: Whole body anterior and posterior images were obtained approximately 3 hours after intravenous injection of radiopharmaceutical. RADIOPHARMACEUTICALS:  19.8 mCi Technetium-54mMDP IV COMPARISON:  CT 04/03/2022 FINDINGS: Anterior and posterior whole body planar images are obtained. Physiologic excretion of radiotracer within the kidneys and bladder. Photopenia within the left hip related to prior arthroplasty, with mild low level postsurgical change surrounding the femoral component of the arthroplasty.  Degenerative type activity is seen at the bilateral shoulders and right hip. Nonspecific foci of activity are seen within the right anterior tenth rib at the costochondral junction and left posterior ninth rib in the midclavicular line. Prior healed right anterior tenth rib fracture seen on recent CT. There is no imaging of the posterior left ninth rib, though this also likely reflects posttraumatic change. No abnormal radiotracer activity to suggest bony metastases. IMPRESSION: 1. No evidence of bony metastatic disease. 2. Posttraumatic changes within the right anterior tenth and left posterior ninth ribs. 3. Degenerative type activity of the bilateral shoulders and right hip. 4. Left hip arthroplasty. Electronically Signed   By: MRanda NgoM.D.   On: 04/04/2022 20:16   CT HEMATURIA WORKUP  Result Date: 04/03/2022 CLINICAL DATA:  Gross hematuria positive cytology. History of prostate cancer 2019 status post prostatectomy. * Tracking Code: BO * EXAM: CT ABDOMEN AND PELVIS WITHOUT AND WITH CONTRAST TECHNIQUE: Multidetector CT imaging of the abdomen and pelvis was performed following the standard protocol before and following the bolus administration of intravenous contrast. RADIATION DOSE REDUCTION: This exam was performed according to the departmental dose-optimization program which includes automated exposure control, adjustment of the mA and/or kV according to patient size and/or use of iterative reconstruction technique. CONTRAST:  1059mOMNIPAQUE IOHEXOL 350 MG/ML SOLN COMPARISON:  None. FINDINGS: Lower chest: Minimal scarring in the lung bases. Heart size normal. No pericardial or pleural effusion. Atherosclerotic calcification of the aorta and coronary arteries. Hepatobiliary: Liver and gallbladder are unremarkable. No biliary ductal dilatation. Pancreas: Negative. Spleen: Negative. Adrenals/Urinary Tract: Adrenal glands are unremarkable. Peripherally calcified fluid density mass off the interpolar  right kidney measures 5.5 x 6.4 cm, compatible  with a Bosniak 2 cyst. Additional subcentimeter low-attenuation lesions in the kidneys are too small to characterize. No specific follow-up necessary. No urinary stones. Ureters are decompressed. No filling defects in the intrarenal collecting systems or ureters. Bladder is largely obscured by streak artifact from a left hip arthroplasty. There may be ventral bladder wall thickening. Stomach/Bowel: Stomach, small bowel, appendix and colon are unremarkable. Vascular/Lymphatic: Atherosclerotic calcification of the aorta. No pathologically enlarged lymph nodes. Reproductive: Prostatectomy. Other: No free fluid.  Mesenteries and peritoneum are unremarkable. Musculoskeletal: Left hip arthroplasty. Degenerative changes in the spine. No worrisome lytic or sclerotic lesions. IMPRESSION: 1. Bladder is relatively is relatively obscured by streak artifact from a left hip arthroplasty. There may be ventral bladder wall thickening. No additional findings to explain the patient's hematuria. 2. Aortic atherosclerosis (ICD10-I70.0). Coronary artery calcification. Electronically Signed   By: Lorin Picket M.D.   On: 04/03/2022 09:58    Bone scan report is pending but on my review there was small area of uptake in a left posterior rib and the right shoulder.     Procedure: Cystoscopy.  He was prepped with betadine and 2% lidocaine jelly.  He was given cipro '500mg'$  po post procedure.  The scope passed easily and there were no urethral abnormalities.  The external sphincter is intake.  The prostate is absent.  The bladder has mild trabeculation with very minor radiation changes at the bladder base.  The UO's were normal and effluxed clear urine.   Assessment/Plan: Hematuria with positive cytology.    CT hematuria study and cystoscopy.are negative.  I will repeat a cytology in 3 months and if still positive, he may need bladder biopsies and ureteroscopy.   Prostate cancer with  rising PSA s/p RALP and salvage radiation therapy.  There is a left posterior rib lesion but he has had prior fractures.  I will get a PMSA PET to better evaluate.   Meds ordered this encounter  Medications   ciprofloxacin (CIPRO) tablet 500 mg     Orders Placed This Encounter  Procedures   NM PET (PSMA) SKULL TO MID THIGH    Standing Status:   Future    Standing Expiration Date:   04/05/2023    Order Specific Question:   If indicated for the ordered procedure, I authorize the administration of a radiopharmaceutical per Radiology protocol    Answer:   Yes    Order Specific Question:   Preferred imaging location?    Answer:   [facility]   Urinalysis, Routine w reflex microscopic   PSA    Standing Status:   Future    Standing Expiration Date:   10/04/2022     Return in about 3 months (around 07/05/2022) for with PSA and urine cytology. .    CC: Dr. Gar Ponto.      Irine Seal 04/05/2022 [phone]

## 2022-04-04 NOTE — Telephone Encounter (Signed)
Notified via detailed voice message.  

## 2022-04-05 LAB — URINALYSIS, ROUTINE W REFLEX MICROSCOPIC
Bilirubin, UA: NEGATIVE
Glucose, UA: NEGATIVE
Ketones, UA: NEGATIVE
Leukocytes,UA: NEGATIVE
Nitrite, UA: NEGATIVE
Protein,UA: NEGATIVE
RBC, UA: NEGATIVE
Specific Gravity, UA: <1.005 — ABNORMAL LOW (ref 1.005–1.030)
Urobilinogen, Ur: 0.2 mg/dL (ref 0.2–1.0)
pH, UA: 6 (ref 5.0–7.5)

## 2022-04-05 LAB — CYTOLOGY, URINE

## 2022-04-08 ENCOUNTER — Other Ambulatory Visit: Payer: Self-pay

## 2022-04-08 NOTE — Progress Notes (Signed)
Opened in error

## 2022-04-16 ENCOUNTER — Ambulatory Visit: Payer: Medicare HMO | Admitting: Orthopaedic Surgery

## 2022-05-09 DIAGNOSIS — R5383 Other fatigue: Secondary | ICD-10-CM | POA: Diagnosis not present

## 2022-05-09 DIAGNOSIS — Z1329 Encounter for screening for other suspected endocrine disorder: Secondary | ICD-10-CM | POA: Diagnosis not present

## 2022-05-09 DIAGNOSIS — E782 Mixed hyperlipidemia: Secondary | ICD-10-CM | POA: Diagnosis not present

## 2022-05-09 DIAGNOSIS — I10 Essential (primary) hypertension: Secondary | ICD-10-CM | POA: Diagnosis not present

## 2022-05-09 DIAGNOSIS — E7849 Other hyperlipidemia: Secondary | ICD-10-CM | POA: Diagnosis not present

## 2022-05-09 DIAGNOSIS — Z125 Encounter for screening for malignant neoplasm of prostate: Secondary | ICD-10-CM | POA: Diagnosis not present

## 2022-05-15 DIAGNOSIS — R002 Palpitations: Secondary | ICD-10-CM | POA: Diagnosis not present

## 2022-05-15 DIAGNOSIS — F17210 Nicotine dependence, cigarettes, uncomplicated: Secondary | ICD-10-CM | POA: Diagnosis not present

## 2022-05-15 DIAGNOSIS — I4891 Unspecified atrial fibrillation: Secondary | ICD-10-CM | POA: Diagnosis not present

## 2022-05-15 DIAGNOSIS — M109 Gout, unspecified: Secondary | ICD-10-CM | POA: Diagnosis not present

## 2022-05-15 DIAGNOSIS — J449 Chronic obstructive pulmonary disease, unspecified: Secondary | ICD-10-CM | POA: Diagnosis not present

## 2022-05-15 DIAGNOSIS — D692 Other nonthrombocytopenic purpura: Secondary | ICD-10-CM | POA: Diagnosis not present

## 2022-05-15 DIAGNOSIS — Z6822 Body mass index (BMI) 22.0-22.9, adult: Secondary | ICD-10-CM | POA: Diagnosis not present

## 2022-05-15 DIAGNOSIS — Z1389 Encounter for screening for other disorder: Secondary | ICD-10-CM | POA: Diagnosis not present

## 2022-05-15 DIAGNOSIS — Z0001 Encounter for general adult medical examination with abnormal findings: Secondary | ICD-10-CM | POA: Diagnosis not present

## 2022-05-15 DIAGNOSIS — I739 Peripheral vascular disease, unspecified: Secondary | ICD-10-CM | POA: Diagnosis not present

## 2022-05-15 DIAGNOSIS — M353 Polymyalgia rheumatica: Secondary | ICD-10-CM | POA: Diagnosis not present

## 2022-05-15 DIAGNOSIS — Z1331 Encounter for screening for depression: Secondary | ICD-10-CM | POA: Diagnosis not present

## 2022-05-30 ENCOUNTER — Other Ambulatory Visit: Payer: Medicare HMO

## 2022-06-06 ENCOUNTER — Ambulatory Visit (INDEPENDENT_AMBULATORY_CARE_PROVIDER_SITE_OTHER): Payer: Medicare HMO | Admitting: Urology

## 2022-06-06 VITALS — BP 127/61 | HR 75

## 2022-06-06 DIAGNOSIS — Z8546 Personal history of malignant neoplasm of prostate: Secondary | ICD-10-CM | POA: Diagnosis not present

## 2022-06-06 DIAGNOSIS — R8289 Other abnormal findings on cytological and histological examination of urine: Secondary | ICD-10-CM

## 2022-06-06 DIAGNOSIS — R310 Gross hematuria: Secondary | ICD-10-CM | POA: Diagnosis not present

## 2022-06-06 DIAGNOSIS — R9721 Rising PSA following treatment for malignant neoplasm of prostate: Secondary | ICD-10-CM | POA: Diagnosis not present

## 2022-06-06 DIAGNOSIS — C61 Malignant neoplasm of prostate: Secondary | ICD-10-CM

## 2022-06-06 LAB — URINALYSIS, ROUTINE W REFLEX MICROSCOPIC
Bilirubin, UA: NEGATIVE
Glucose, UA: NEGATIVE
Ketones, UA: NEGATIVE
Leukocytes,UA: NEGATIVE
Nitrite, UA: NEGATIVE
Protein,UA: NEGATIVE
RBC, UA: NEGATIVE
Specific Gravity, UA: 1.01 (ref 1.005–1.030)
Urobilinogen, Ur: 0.2 mg/dL (ref 0.2–1.0)
pH, UA: 5.5 (ref 5.0–7.5)

## 2022-06-06 NOTE — Progress Notes (Signed)
Subjective: 1. Gross hematuria   2. Prostate cancer (Dan [name])   3. Rising PSA following treatment for malignant neoplasm of prostate   4. Urinary cytology positive    06/06/22: Metro returns today in f/u.  His repeat cytology on 04/04/22 was negative.  He was supposed to have a PSMA PET prior to this visit but that wasn't done.  He has no bone pain and he has gained some weight.  He has had no further hematuria.    04/04/22: Dan [name] returns today in f/u for his history of gross hematuria and prostate cancer with a rising PSA post prostatectomy.   The CT hematuria study showed possible bladder wall thickening and on  my review there was a possible lesion at the left bladder base.  He had no evidence of prostate cancer mets on CT but there is uptake in a left posterior rib on bone scan.  He does report prior rib fractures.  He has some right shoulder uptake as well that is probably arthritis.  He has had no more hematuria.    03/28/22: Dan [name] is a 76 yo male with a history of prostate cancer that was bulky on the left with Gleason 8 disease on biospy for a PSA of 12.8.  Prostate volume was 64m.  He was treated with RALP with Dr. RDorothyann Pengin 2019 and was found to have T3b N0 M0 Gleason 7(4+3) prostate cancer on surgical path with a nadir PSA post op of 2.7 and a subsequent rise to 5.7 by 05/12/19 and [place] showed prostate bed uptake.  He then had radiation therapy by Dr. MCharlotta Newtonfor a PSA recurrence in 2020.  He is referred now with hematuria and a cytology that showed cells suspicious for high grade malignancy.  He has a smoker.  His Cr was 1.14 on 02/01/22 and his PSA was 2.2.  It had gotten down to 0.1 after the radiation therapy.  He only had one episode of hematuria the day he saw Dr. DQuillian Quince  His UA is clear today.   He is on only an aspirin for his a fib. He has only rare incontinence.  He has post prostatectomy ED.     ROS:  Review of Systems  Respiratory:  Negative for shortness of breath.    Musculoskeletal:  Positive for joint pain (right shoulder).  All other systems reviewed and are negative.   Allergies  Allergen Reactions   Tamsulosin Hives and Itching    Past Medical History:  Diagnosis Date   BPH (benign prostatic hyperplasia)    COPD (chronic obstructive pulmonary disease) (HCC)    Depression    ED (erectile dysfunction)    Gout    Hyperlipidemia    Hypertension    Loss of hearing    Nocturia    Paroxysmal atrial fibrillation (HCC)    Prostate cancer ([facility]     Past Surgical History:  Procedure Laterality Date   CARDIOVASCULAR STRESS TEST  12/31/2004   normal perfuction nuclear study w/ no evidence ischemia /  normal LV function and wall motion , ef 52%   PROSTATECTOMY  2019   TOTAL HIP ARTHROPLASTY      Social History   Socioeconomic History   Marital status: Married    Spouse name: Not on file   Number of children: Not on file   Years of education: Not on file   Highest education level: Not on file  Occupational History   Not on file  Tobacco Use  Smoking status: Every Day    Types: Cigarettes    Start date: 06/12/1967   Smokeless tobacco: Never  Vaping Use   Vaping Use: Never used  Substance and Sexual Activity   Alcohol use: Not Currently   Drug use: Not Currently   Sexual activity: Not on file  Other Topics Concern   Not on file  Social History Narrative   Not on file   Social Determinants of Health   Financial Resource Strain: Not on file  Food Insecurity: Not on file  Transportation Needs: Not on file  Physical Activity: Not on file  Stress: Not on file  Social Connections: Not on file  Intimate Partner Violence: Not on file    Family History  Problem Relation Age of Onset   Hypertension Mother    Cancer Father        lung   Colon cancer Neg Hx    Inflammatory bowel disease Neg Hx    Pancreatic cancer Neg Hx     Anti-infectives: Anti-infectives (From admission, onward)    None       Current Outpatient  Medications  Medication Sig Dispense Refill   albuterol (PROVENTIL HFA;VENTOLIN HFA) 108 (90 Base) MCG/ACT inhaler Inhale 2 puffs into the lungs every 6 (six) hours as needed for wheezing or shortness of breath.     albuterol (PROVENTIL) (2.5 MG/3ML) 0.083% nebulizer solution Take 2.5 mg by nebulization every 6 (six) hours as needed.     allopurinol (ZYLOPRIM) 100 MG tablet Take 100 mg by mouth daily.     Ascorbic Acid (VITAMIN C) 1000 MG tablet Take 1,000 mg by mouth daily.     aspirin EC 81 MG tablet Take 81 mg by mouth daily. Swallow whole.     cholecalciferol (VITAMIN D3) 25 MCG (1000 UNIT) tablet Take 1,000 Units by mouth daily.     colchicine 0.6 MG tablet Take 1 tablet by mouth daily as needed.     dicyclomine (BENTYL) 10 MG capsule Take 1 capsule (10 mg total) by mouth daily before breakfast.  You may increase this to twice daily if needed. Hold in the setting of constipation. (Patient taking differently: Take 1 capsule (10 mg total) by mouth daily before breakfast.  You may increase this to twice daily if needed. Hold in the setting of constipation.) 60 capsule 2   diltiazem (CARDIZEM CD) 360 MG 24 hr capsule Take by mouth daily.     diltiazem (TIAZAC) 360 MG 24 hr capsule Take 360 mg by mouth daily.     folic acid (FOLVITE) 1 MG tablet Take 1 mg by mouth daily.     HYDROcodone-acetaminophen (NORCO/VICODIN) 5-325 MG tablet One tablet every six hours for pain.  Limit 7 days. 20 tablet 0   losartan (COZAAR) 25 MG tablet Take 1 tablet (25 mg total) by mouth daily. 90 tablet 1   potassium chloride (MICRO-K) 10 MEQ CR capsule TAKE 2 CAPSULES BY MOUTH DAILY. 7 capsule 0   Probiotic Product (PROBIOTIC DAILY PO) Take by mouth.     TRELEGY ELLIPTA 200-62.5-25 MCG/ACT AEPB Inhale 1 puff into the lungs daily.     Zinc 50 MG TABS Take 1 tablet by mouth daily.     No current facility-administered medications for this visit.     Objective: Vital signs in last 24 hours: BP 127/61   Pulse 75    Intake/Output from previous day: No intake/output data recorded. Intake/Output this shift: '@IOTHISSHIFT'$ @   Physical Exam  Lab Results:  No  results found for this or any previous visit (from the past 24 hour(s)).    BMET No results for input(s): "NA", "K", "CL", "CO2", "GLUCOSE", "BUN", "CREATININE", "CALCIUM" in the last 72 hours. PT/INR No results for input(s): "LABPROT", "INR" in the last 72 hours. ABG No results for input(s): "PHART", "HCO3" in the last 72 hours.  Invalid input(s): "PCO2", "PO2" Cytology reviewed.  CMP, CBC and PSA from 02/01/22 reviewed.     PSA's from 2022 reviewed.    UA is clear.  Studies/Results: NM Bone Scan Whole Body  Result Date: 04/04/2022 CLINICAL DATA:  Prostate cancer, rising PSA EXAM: NUCLEAR MEDICINE WHOLE BODY BONE SCAN TECHNIQUE: Whole body anterior and posterior images were obtained approximately 3 hours after intravenous injection of radiopharmaceutical. RADIOPHARMACEUTICALS:  19.8 mCi Technetium-81mMDP IV COMPARISON:  CT 04/03/2022 FINDINGS: Anterior and posterior whole body planar images are obtained. Physiologic excretion of radiotracer within the kidneys and bladder. Photopenia within the left hip related to prior arthroplasty, with mild low level postsurgical change surrounding the femoral component of the arthroplasty. Degenerative type activity is seen at the bilateral shoulders and right hip. Nonspecific foci of activity are seen within the right anterior tenth rib at the costochondral junction and left posterior ninth rib in the midclavicular line. Prior healed right anterior tenth rib fracture seen on recent CT. There is no imaging of the posterior left ninth rib, though this also likely reflects posttraumatic change. No abnormal radiotracer activity to suggest bony metastases. IMPRESSION: 1. No evidence of bony metastatic disease. 2. Posttraumatic changes within the right anterior tenth and left posterior ninth ribs. 3. Degenerative  type activity of the bilateral shoulders and right hip. 4. Left hip arthroplasty. Electronically Signed   By: MRanda NgoM.D.   On: 04/04/2022 20:16   CT HEMATURIA WORKUP  Result Date: 04/03/2022 CLINICAL DATA:  Gross hematuria positive cytology. History of prostate cancer 2019 status post prostatectomy. * Tracking Code: BO * EXAM: CT ABDOMEN AND PELVIS WITHOUT AND WITH CONTRAST TECHNIQUE: Multidetector CT imaging of the abdomen and pelvis was performed following the standard protocol before and following the bolus administration of intravenous contrast. RADIATION DOSE REDUCTION: This exam was performed according to the departmental dose-optimization program which includes automated exposure control, adjustment of the mA and/or kV according to patient size and/or use of iterative reconstruction technique. CONTRAST:  1066mOMNIPAQUE IOHEXOL 350 MG/ML SOLN COMPARISON:  None. FINDINGS: Lower chest: Minimal scarring in the lung bases. Heart size normal. No pericardial or pleural effusion. Atherosclerotic calcification of the aorta and coronary arteries. Hepatobiliary: Liver and gallbladder are unremarkable. No biliary ductal dilatation. Pancreas: Negative. Spleen: Negative. Adrenals/Urinary Tract: Adrenal glands are unremarkable. Peripherally calcified fluid density mass off the interpolar right kidney measures 5.5 x 6.4 cm, compatible with a Bosniak 2 cyst. Additional subcentimeter low-attenuation lesions in the kidneys are too small to characterize. No specific follow-up necessary. No urinary stones. Ureters are decompressed. No filling defects in the intrarenal collecting systems or ureters. Bladder is largely obscured by streak artifact from a left hip arthroplasty. There may be ventral bladder wall thickening. Stomach/Bowel: Stomach, small bowel, appendix and colon are unremarkable. Vascular/Lymphatic: Atherosclerotic calcification of the aorta. No pathologically enlarged lymph nodes. Reproductive:  Prostatectomy. Other: No free fluid.  Mesenteries and peritoneum are unremarkable. Musculoskeletal: Left hip arthroplasty. Degenerative changes in the spine. No worrisome lytic or sclerotic lesions. IMPRESSION: 1. Bladder is relatively is relatively obscured by streak artifact from a left hip arthroplasty. There may be ventral bladder wall thickening.  No additional findings to explain the patient's hematuria. 2. Aortic atherosclerosis (ICD10-I70.0). Coronary artery calcification. Electronically Signed   By: Lorin Picket M.D.   On: 04/03/2022 09:58    Bone scan report is pending but on my review there was small area of uptake in a left posterior rib and the right shoulder.      Assessment/Plan: Hematuria with positive cytology.   He has had no further hematuria.  CT hematuria study and cystoscopy.are negative.  His repeat cytology on 04/04/22 was negative.   He will return in 6 months.   Prostate cancer with rising PSA s/p RALP and salvage radiation therapy.  There is a left posterior rib lesion but he has had prior fractures.  He didn't get the [facility] PET done.  I will repeat the PSA and if there is a further increase, I will reorder the scan.  If the PSA is stable or down, I will have him return with a PSA in 6 months.    No orders of the defined types were placed in this encounter.    Orders Placed This Encounter  Procedures   Urinalysis, Routine w reflex microscopic   PSA   PSA    Standing Status:   Future    Standing Expiration Date:   06/07/2023     Return in about 6 months (around 12/07/2022) for with PSA.    CC: Dr. Gar Ponto.      Irine Seal 06/06/2022 [phone]

## 2022-06-07 ENCOUNTER — Other Ambulatory Visit: Payer: Self-pay | Admitting: Urology

## 2022-06-07 ENCOUNTER — Encounter: Payer: Self-pay | Admitting: Urology

## 2022-06-07 DIAGNOSIS — C61 Malignant neoplasm of prostate: Secondary | ICD-10-CM

## 2022-06-07 DIAGNOSIS — R9721 Rising PSA following treatment for malignant neoplasm of prostate: Secondary | ICD-10-CM

## 2022-06-07 LAB — PSA: Prostate Specific Ag, Serum: 3.4 ng/mL (ref 0.0–4.0)

## 2022-06-13 ENCOUNTER — Telehealth: Payer: Self-pay

## 2022-06-13 NOTE — Telephone Encounter (Signed)
-----   Message from Irine Seal, MD sent at 06/07/2022  1:30 PM EDT ----- His PSA is up from 2.2 to 3.4 and I have reentered the order for the PSMA PET scan.  Please get that scheduled.

## 2022-06-13 NOTE — Telephone Encounter (Signed)
Mychart message sent.

## 2022-06-18 ENCOUNTER — Ambulatory Visit (HOSPITAL_COMMUNITY)
Admission: RE | Admit: 2022-06-18 | Discharge: 2022-06-18 | Disposition: A | Discharge status: Home / Self Care | Payer: Medicare HMO | Source: Ambulatory Visit | Attending: Urology | Admitting: Urology

## 2022-06-18 DIAGNOSIS — R9721 Rising PSA following treatment for malignant neoplasm of prostate: Secondary | ICD-10-CM | POA: Diagnosis not present

## 2022-06-18 DIAGNOSIS — C61 Malignant neoplasm of prostate: Secondary | ICD-10-CM

## 2022-06-18 MED ORDER — PIFLIFOLASTAT F 18 (PYLARIFY) INJECTION
9.0000 | Freq: Once | INTRAVENOUS | Status: AC
  Administered 2022-06-18: 8.88 via INTRAVENOUS

## 2022-06-21 ENCOUNTER — Telehealth: Payer: Self-pay

## 2022-06-21 DIAGNOSIS — C61 Malignant neoplasm of prostate: Secondary | ICD-10-CM

## 2022-06-21 NOTE — Telephone Encounter (Signed)
-----   Message from Irine Seal, MD sent at 06/21/2022  9:18 AM EDT ----- The PET scan shows uptake in a single lymph node in the right pelvis.   This will probably ammenable to additional salvage radiation therapy.   If he is agreeable, I would like to get him referred down to Dr. Ledon Snare with radiation oncology to discuss that option.   If he would rather talk to me first, please get him on the schedule in the next couple of weeks.     ----- Message ----- From: Sherrilyn Rist, CMA Sent: 06/20/2022   8:31 AM EDT To: Irine Seal, MD  Please Review

## 2022-06-21 NOTE — Telephone Encounter (Signed)
Made patient aware about getting a referral sent over to Dr. Tammi Klippel with radiation oncology due to lymph node in the right pelvis or having the option to speak with Dr. Jeffie Pollock before hand. Patient voiced that he would prefer to speak with Dr. Roni Bread prior to being refer to Dr. Tammi Klippel. Patient is schedule 08/31 to discuss options and referral. Patient voiced understanding.

## 2022-06-24 ENCOUNTER — Telehealth: Payer: Self-pay

## 2022-06-24 NOTE — Telephone Encounter (Signed)
Wife in office requesting to cancel up coming appointment and move forward with referral to Dr. Tammi Klippel for cancer treatment. Referral sent.

## 2022-06-24 NOTE — Addendum Note (Signed)
Addended byIris Pert on: 06/24/2022 09:31 AM   Modules accepted: Orders

## 2022-06-25 NOTE — Telephone Encounter (Signed)
Opened in error

## 2022-06-27 NOTE — Progress Notes (Signed)
GU Location of Tumor / Histology: Prostate Ca  If Prostate Cancer, Gleason Score is (4 + 4) and PSA is (12.8 on 03/28/2022)  06/18/2022 Dr. Jeffie Pollock NM PET (PSMA) Skull To Mid Thigh CLINICAL DATA:  Prostate carcinoma with biochemical recurrence.  Prostatectomy 2019. Radiation 2020. PSA equal 3.4 on 06/06/2022.  FINDINGS: NECK No radiotracer activity in neck lymph nodes.   Incidental CT finding: None.   CHEST No radiotracer accumulation within mediastinal or hilar lymph nodes.  No suspicious pulmonary nodules on the CT scan.   Incidental CT finding: None.   ABDOMEN/PELVIS   Prostate: No focal activity in the prostate bed.   Lymph nodes: Single focus of radiotracer activity along the RIGHT common iliac vein. While this is in the vicinity of the RIGHT ureter, the activity locates slightly dorsal and associates with a small 2 mm lymph node on image 180/4.  Additionally, there is no urine activity elsewhere within LEFT or RIGHT ureter.   Liver: No evidence of liver metastasis.   Incidental CT finding: Large benign periphery calcified RIGHT renal cyst.   SKELETON Single focus of radiotracer activity associated the posterior aspect of the LEFT ninth rib with SUV max equal 2.7. This low activity is favored related to trauma rather than metastatic disease.   IMPRESSION: 1. No evidence local prostate cancer recurrence in the prostatectomy bed. 2. Single radiotracer avid pelvic pelvic lymph node adjacent to the RIGHT common iliac vein. 3. No evidence of visceral metastasis or skeletal metastasis. 4. Radiotracer activity associated posterior LEFT ninth rib is favored benign posttraumatic.    04/03/2022 Dr. Jeffie Pollock NM Bone Scan Whole Body CLINICAL DATA:  Prostate cancer, rising PSA  FINDINGS: Anterior and posterior whole body planar images are obtained.  Physiologic excretion of radiotracer within the kidneys and bladder.  Photopenia within the left hip related to prior arthroplasty, with  mild low level postsurgical change surrounding the femoral component of the arthroplasty.   Degenerative type activity is seen at the bilateral shoulders and right hip.   Nonspecific foci of activity are seen within the right anterior tenth rib at the costochondral junction and left posterior ninth rib in the midclavicular line. Prior healed right anterior tenth rib fracture seen on recent CT. There is no imaging of the posterior left ninth rib, though this also likely reflects posttraumatic change.   No abnormal radiotracer activity to suggest bony metastases.   IMPRESSION: 1. No evidence of bony metastatic disease. 2. Posttraumatic changes within the right anterior tenth and left posterior ninth ribs. 3. Degenerative type activity of the bilateral shoulders and right hip. 4. Left hip arthroplasty.   04/03/2022 Dr. Jeffie Pollock CT Hematuria Workup CLINICAL DATA:  Gross hematuria positive cytology. History of prostate cancer 2019 status post prostatectomy. * Tracking Code: BO*  FINDINGS: Lower chest: Minimal scarring in the lung bases. Heart size normal.  No pericardial or pleural effusion. Atherosclerotic calcification of the aorta and coronary arteries.   Hepatobiliary: Liver and gallbladder are unremarkable. No biliary ductal dilatation.   Pancreas: Negative.   Spleen: Negative.   Adrenals/Urinary Tract: Adrenal glands are unremarkable.  Peripherally calcified fluid density mass off the interpolar right kidney measures 5.5 x 6.4 cm, compatible with a Bosniak 2 cyst.  Additional subcentimeter low-attenuation lesions in the kidneys are too small to characterize. No specific follow-up necessary. No urinary stones. Ureters are decompressed. No filling defects in the intrarenal collecting systems or ureters. Bladder is largely obscured by streak artifact from a left hip arthroplasty. There may be  ventral bladder wall thickening.   Stomach/Bowel: Stomach, small bowel, appendix and colon are  unremarkable.   Vascular/Lymphatic: Atherosclerotic calcification of the aorta. No pathologically enlarged lymph nodes.   Reproductive: Prostatectomy.   Other: No free fluid.  Mesenteries and peritoneum are unremarkable.   Musculoskeletal: Left hip arthroplasty. Degenerative changes in the spine. No worrisome lytic or sclerotic lesions.   IMPRESSION: 1. Bladder is relatively is relatively obscured by streak artifact from a left hip arthroplasty. There may be ventral bladder wall thickening. No additional findings to explain the patient's hematuria. 2. Aortic atherosclerosis (ICD10-I70.0). Coronary artery calcification.    Past/Anticipated interventions by urology, if any: NA  Past/Anticipated interventions by medical oncology, if any: NA  Weight changes, if any:  No  IPPS:  4 SHIM:  19  Bowel/Bladder complaints, if any:  No  Nausea/Vomiting, if any: No  Pain issues, if any:  0/10  SAFETY ISSUES: Prior radiation? Yes, 2020 (Dr. Charlotta Newton). Pacemaker/ICD? No Possible current pregnancy? Male Is the patient on methotrexate? No  Current Complaints / other details:  Prostatectomy (2019).

## 2022-07-01 NOTE — Progress Notes (Signed)
Radiation Oncology         [phone]) [phone] ________________________________  Initial outpatient Consultation  Name: Dan [name] MRN: [id number]  Date of Service: 07/02/2022 DOB: July 26, 1946  XN:[id number], Mitzie Na, MD  Irine Seal, MD   REFERRING PHYSICIAN: Irine Seal, MD  DIAGNOSIS: There were no encounter diagnoses.  No diagnosis found.  HISTORY OF PRESENT ILLNESS: Dan [name] is a 76 y.o. male seen at the request of Dr. Jeffie Pollock. He was initially diagnosed with Gleason 4+4 prostate cancer in 06/2018. He opted to proceed with RALP with Dr. Dorothyann Peng on 09/22/18, with pathology showing stage pT3b Gleason 4+3 prostate cancer, with extracapsular extension, involvement of right seminal vesicle, and perineural invasion. Margins and all four lymph nodes were negative. His postoperative PSA was elevated at 2.7, which further rose to 5.7 by 05/12/19. He was treated with adjuvant radiation under Dr. Charlotta Newton in 2020, and his PSA reached a nadir of 0.1.  More recently, he presented to his PCP with hematuria. He had urine cytology obtained on 03/05/22, and results revealed high-grade urothelial cells. He was referred to Dr. Jeffie Pollock on 03/28/22, and PSA obtained that day was 2.2. He underwent CT A/P hematuria protocol and bone scan on 04/03/22, both of which were benign. He had repeat urine cytology on 04/04/22, and results showed only reactive urothelial cells with acute inflammation and bacteria.  He underwent repeat PSA on 06/06/22, which showed further elevation to 3.4. This prompted PSMA PET scan on 06/18/22, showing: no evidence of local prostate cancer recurrence; single radiotracer-avid pelvic lymph node adjacent to right common iliac vein; no evidence of visceral or skeletal metastasis.  PREVIOUS RADIATION THERAPY: Yes  07/05/19 - 08/31/19: 4500 cGy in 25 fractions to the pelvic lymph nodes boost of 2160 cGy in 12 fractions to the prostate bed for total dose of 6660 cGy in 37 fractions to the prostate bed  PAST  MEDICAL HISTORY:  Past Medical History:  Diagnosis Date   BPH (benign prostatic hyperplasia)    COPD (chronic obstructive pulmonary disease) (HCC)    Depression    ED (erectile dysfunction)    Gout    Hyperlipidemia    Hypertension    Loss of hearing    Nocturia    Paroxysmal atrial fibrillation (HCC)    Prostate cancer ([place])       PAST SURGICAL HISTORY: Past Surgical History:  Procedure Laterality Date   CARDIOVASCULAR STRESS TEST  12/31/2004   normal perfuction nuclear study w/ no evidence ischemia /  normal LV function and wall motion , ef 52%   PROSTATECTOMY  2019   TOTAL HIP ARTHROPLASTY      FAMILY HISTORY:  Family History  Problem Relation Age of Onset   Hypertension Mother    Cancer Father        lung   Colon cancer Neg Hx    Inflammatory bowel disease Neg Hx    Pancreatic cancer Neg Hx     SOCIAL HISTORY:  Social History   Socioeconomic History   Marital status: Married    Spouse name: Not on file   Number of children: Not on file   Years of education: Not on file   Highest education level: Not on file  Occupational History   Not on file  Tobacco Use   Smoking status: Every Day    Types: Cigarettes    Start date: 06/12/1967   Smokeless tobacco: Never  Vaping Use   Vaping Use: Never used  Substance and Sexual Activity  Alcohol use: Not Currently   Drug use: Not Currently   Sexual activity: Not on file  Other Topics Concern   Not on file  Social History Narrative   Not on file   Social Determinants of Health   Financial Resource Strain: Not on file  Food Insecurity: Not on file  Transportation Needs: Not on file  Physical Activity: Not on file  Stress: Not on file  Social Connections: Not on file  Intimate Partner Violence: Not on file    ALLERGIES: Tamsulosin  MEDICATIONS:  Current Outpatient Medications  Medication Sig Dispense Refill   albuterol (PROVENTIL HFA;VENTOLIN HFA) 108 (90 Base) MCG/ACT inhaler Inhale 2 puffs into the  lungs every 6 (six) hours as needed for wheezing or shortness of breath.     albuterol (PROVENTIL) (2.5 MG/3ML) 0.083% nebulizer solution Take 2.5 mg by nebulization every 6 (six) hours as needed.     allopurinol (ZYLOPRIM) 100 MG tablet Take 100 mg by mouth daily.     Ascorbic Acid (VITAMIN C) 1000 MG tablet Take 1,000 mg by mouth daily.     aspirin EC 81 MG tablet Take 81 mg by mouth daily. Swallow whole.     cholecalciferol (VITAMIN D3) 25 MCG (1000 UNIT) tablet Take 1,000 Units by mouth daily.     colchicine 0.6 MG tablet Take 1 tablet by mouth daily as needed.     dicyclomine (BENTYL) 10 MG capsule Take 1 capsule (10 mg total) by mouth daily before breakfast.  You may increase this to twice daily if needed. Hold in the setting of constipation. (Patient taking differently: Take 1 capsule (10 mg total) by mouth daily before breakfast.  You may increase this to twice daily if needed. Hold in the setting of constipation.) 60 capsule 2   diltiazem (CARDIZEM CD) 360 MG 24 hr capsule Take by mouth daily.     diltiazem (TIAZAC) 360 MG 24 hr capsule Take 360 mg by mouth daily.     folic acid (FOLVITE) 1 MG tablet Take 1 mg by mouth daily.     HYDROcodone-acetaminophen (NORCO/VICODIN) 5-325 MG tablet One tablet every six hours for pain.  Limit 7 days. 20 tablet 0   losartan (COZAAR) 25 MG tablet Take 1 tablet (25 mg total) by mouth daily. 90 tablet 1   potassium chloride (MICRO-K) 10 MEQ CR capsule TAKE 2 CAPSULES BY MOUTH DAILY. 7 capsule 0   Probiotic Product (PROBIOTIC DAILY PO) Take by mouth.     TRELEGY ELLIPTA 200-62.5-25 MCG/ACT AEPB Inhale 1 puff into the lungs daily.     Zinc 50 MG TABS Take 1 tablet by mouth daily.     No current facility-administered medications for this encounter.    REVIEW OF SYSTEMS:  On review of systems, the patient reports that he is doing well overall. He denies any chest pain, shortness of breath, cough, fevers, chills, night sweats, unintended weight changes. He  denies any bowel or bladder disturbances, and denies abdominal pain, nausea or vomiting. He denies any new musculoskeletal or joint aches or pains. A complete review of systems is obtained and is otherwise negative.    PHYSICAL EXAM:  Wt Readings from Last 3 Encounters:  02/13/22 136 lb (61.7 kg)  05/08/21 130 lb 12.8 oz (59.3 kg)  03/22/21 135 lb (61.2 kg)   Temp Readings from Last 3 Encounters:  02/13/22 100 F (37.8 C)  01/22/21 (!) 97.3 F (36.3 C) (Temporal)   BP Readings from Last 3 Encounters:  06/06/22 127/61  04/04/22 (!) 159/78  03/28/22 (!) 172/71   Pulse Readings from Last 3 Encounters:  06/06/22 75  04/04/22 (!) 101  03/28/22 (!) 105    /10  In general this is a well appearing *** man in no acute distress. He's alert and oriented x4 and appropriate throughout the examination. Cardiopulmonary assessment is negative for acute distress and he exhibits normal effort.     KPS = ***  100 - Normal; no complaints; no evidence of disease. 90   - Able to carry on normal activity; minor signs or symptoms of disease. 80   - Normal activity with effort; some signs or symptoms of disease. 41   - Cares for self; unable to carry on normal activity or to do active work. 60   - Requires occasional assistance, but is able to care for most of his personal needs. 50   - Requires considerable assistance and frequent medical care. 2   - Disabled; requires special care and assistance. 54   - Severely disabled; hospital admission is indicated although death not imminent. 28   - Very sick; hospital admission necessary; active supportive treatment necessary. 10   - Moribund; fatal processes progressing rapidly. 0     - Dead  Karnofsky DA, Abelmann [place], Craver LS and Burchenal JH [phone]) The use of the nitrogen mustards in the palliative treatment of carcinoma: with particular reference to bronchogenic carcinoma Cancer 1 634-56  LABORATORY DATA:  No results found for: "WBC", "HGB",  "HCT", "MCV", "PLT" Lab Results  Component Value Date   NA 139 03/28/2022   K 4.8 03/28/2022   CL 101 03/28/2022   CO2 19 (L) 03/28/2022   No results found for: "ALT", "AST", "GGT", "ALKPHOS", "BILITOT"   RADIOGRAPHY: NM PET (PSMA) SKULL TO MID THIGH  Result Date: 06/19/2022 CLINICAL DATA:  Prostate carcinoma with biochemical recurrence. Prostatectomy 2019. Radiation 2020. PSA equal 3.4 on 06/06/2022. EXAM: NUCLEAR MEDICINE PET SKULL BASE TO THIGH TECHNIQUE: 8.9 mCi F18 Piflufolastat (Pylarify) was injected intravenously. Full-ring PET imaging was performed from the skull base to thigh after the radiotracer. CT data was obtained and used for attenuation correction and anatomic localization. COMPARISON:  CT and bone scan 04/03/2022. FINDINGS: NECK No radiotracer activity in neck lymph nodes. Incidental CT finding: None. CHEST No radiotracer accumulation within mediastinal or hilar lymph nodes. No suspicious pulmonary nodules on the CT scan. Incidental CT finding: None. ABDOMEN/PELVIS Prostate: No focal activity in the prostate bed. Lymph nodes: Single focus of radiotracer activity along the RIGHT common iliac vein. While this is in the vicinity of the RIGHT ureter, the activity locates slightly dorsal and associates with a small 2 mm lymph node on image 180/4. Additionally, there is no urine activity elsewhere within LEFT or RIGHT ureter. Liver: No evidence of liver metastasis. Incidental CT finding: Large benign periphery calcified RIGHT renal cyst. SKELETON Single focus of radiotracer activity associated the posterior aspect of the LEFT ninth rib with SUV max equal 2.7. This low activity is favored related to trauma rather than metastatic disease. IMPRESSION: 1. No evidence local prostate cancer recurrence in the prostatectomy bed. 2. Single radiotracer avid pelvic pelvic lymph node adjacent to the RIGHT common iliac vein. 3. No evidence of visceral metastasis or skeletal metastasis. 4. Radiotracer  activity associated posterior LEFT ninth rib is favored benign posttraumatic. Electronically Signed   By: Suzy Bouchard M.D.   On: 06/19/2022 18:33      IMPRESSION/PLAN: 1. 76 y.o. man with solitary pelvic lymph node  ***  Today, we talked to the patient and family about the findings and workup thus far. We discussed the natural history of prostate carcinoma and general treatment, highlighting the role of radiotherapy in the management. We discussed the available radiation techniques, and focused on the details and logistics of delivery. We reviewed the anticipated acute and late sequelae associated with radiation in this setting. The patient was encouraged to ask questions that were answered to his satisfaction.   At the end of our conversation, the patient ***  I personally spent *** minutes in this encounter including chart review, reviewing radiological studies, meeting face-to-face with the patient, entering orders and completing documentation.    Nicholos Johns, PA-C    Tyler Pita, MD  [facility] Direct Dial: [phone]  Fax: [phone] [email or web address]  Skype  [email or web address]   This document serves as a record of services personally performed by Tyler Pita, MD and Freeman Caldron, PA-C. It was created on their behalf by Wilburn Mylar, a trained medical scribe. The creation of this record is based on the scribe's personal observations and the provider's statements to them. This document has been checked and approved by the attending provider.

## 2022-07-02 ENCOUNTER — Ambulatory Visit
Admission: RE | Admit: 2022-07-02 | Discharge: 2022-07-02 | Disposition: A | Discharge status: Home / Self Care | Payer: Medicare HMO | Source: Ambulatory Visit | Attending: Radiation Oncology | Admitting: Radiation Oncology

## 2022-07-02 ENCOUNTER — Other Ambulatory Visit: Payer: Self-pay

## 2022-07-02 VITALS — BP 146/63 | HR 75 | Temp 97.7°F | Resp 17 | Ht 70.0 in | Wt 148.2 lb

## 2022-07-02 DIAGNOSIS — F17210 Nicotine dependence, cigarettes, uncomplicated: Secondary | ICD-10-CM | POA: Insufficient documentation

## 2022-07-02 DIAGNOSIS — I10 Essential (primary) hypertension: Secondary | ICD-10-CM | POA: Diagnosis not present

## 2022-07-02 DIAGNOSIS — R69 Illness, unspecified: Secondary | ICD-10-CM | POA: Diagnosis not present

## 2022-07-02 DIAGNOSIS — R9721 Rising PSA following treatment for malignant neoplasm of prostate: Secondary | ICD-10-CM | POA: Diagnosis not present

## 2022-07-02 DIAGNOSIS — Z800 Family history of malignant neoplasm of digestive organs: Secondary | ICD-10-CM | POA: Diagnosis not present

## 2022-07-02 DIAGNOSIS — J449 Chronic obstructive pulmonary disease, unspecified: Secondary | ICD-10-CM | POA: Diagnosis not present

## 2022-07-02 DIAGNOSIS — C61 Malignant neoplasm of prostate: Secondary | ICD-10-CM | POA: Insufficient documentation

## 2022-07-02 DIAGNOSIS — Z7982 Long term (current) use of aspirin: Secondary | ICD-10-CM | POA: Diagnosis not present

## 2022-07-02 DIAGNOSIS — Z191 Hormone sensitive malignancy status: Secondary | ICD-10-CM | POA: Diagnosis not present

## 2022-07-02 DIAGNOSIS — Z801 Family history of malignant neoplasm of trachea, bronchus and lung: Secondary | ICD-10-CM | POA: Diagnosis not present

## 2022-07-02 DIAGNOSIS — I480 Paroxysmal atrial fibrillation: Secondary | ICD-10-CM | POA: Insufficient documentation

## 2022-07-02 DIAGNOSIS — E785 Hyperlipidemia, unspecified: Secondary | ICD-10-CM | POA: Diagnosis not present

## 2022-07-02 DIAGNOSIS — Z79899 Other long term (current) drug therapy: Secondary | ICD-10-CM | POA: Insufficient documentation

## 2022-07-02 NOTE — Progress Notes (Signed)
Introduced myself to the patient, and his wife, as the prostate nurse navigator.  No barriers to care identified at this time.  He is here to discuss his radiation treatment options.  I gave him my business card and asked him to call me with questions or concerns.  Verbalized understanding.  

## 2022-07-03 DIAGNOSIS — Z191 Hormone sensitive malignancy status: Secondary | ICD-10-CM | POA: Diagnosis not present

## 2022-07-03 DIAGNOSIS — C775 Secondary and unspecified malignant neoplasm of intrapelvic lymph nodes: Secondary | ICD-10-CM | POA: Diagnosis not present

## 2022-07-03 DIAGNOSIS — C61 Malignant neoplasm of prostate: Secondary | ICD-10-CM | POA: Diagnosis not present

## 2022-07-04 ENCOUNTER — Ambulatory Visit: Payer: Medicare HMO | Admitting: Urology

## 2022-07-05 ENCOUNTER — Other Ambulatory Visit: Payer: Self-pay

## 2022-07-05 ENCOUNTER — Ambulatory Visit
Admission: RE | Admit: 2022-07-05 | Discharge: 2022-07-05 | Disposition: A | Discharge status: Home / Self Care | Payer: Medicare HMO | Source: Ambulatory Visit | Attending: Radiation Oncology | Admitting: Radiation Oncology

## 2022-07-05 DIAGNOSIS — C61 Malignant neoplasm of prostate: Secondary | ICD-10-CM | POA: Diagnosis not present

## 2022-07-05 DIAGNOSIS — Z510 Encounter for antineoplastic radiation therapy: Secondary | ICD-10-CM | POA: Insufficient documentation

## 2022-07-05 DIAGNOSIS — C775 Secondary and unspecified malignant neoplasm of intrapelvic lymph nodes: Secondary | ICD-10-CM | POA: Diagnosis not present

## 2022-07-05 DIAGNOSIS — Z191 Hormone sensitive malignancy status: Secondary | ICD-10-CM | POA: Diagnosis not present

## 2022-07-09 NOTE — Progress Notes (Signed)
  Radiation Oncology         [phone] ________________________________  Name: [name] MRN: [id number]  Date: 07/05/2022  DOB: 19-Mar-1946  ULTRAHYPOFRACTIONATED RADIOTHERAPY ([facility]RT)  SIMULATION AND TREATMENT PLANNING NOTE    ICD-10-CM   1. Malignant neoplasm of prostate metastatic to intrapelvic lymph node ([place])  C61    C77.5       DIAGNOSIS: 76 yo man with oligometastatic prostate cancer involving a solitary right pelvic lymph node, current PSA is 3.4, s/p RALP in 2019 and salvage radiation with ST-ADT in 08/2019.  NARRATIVE:  The patient was brought to the [facility].  Identity was confirmed.  All relevant records and images related to the planned course of therapy were reviewed.  The patient freely provided informed written consent to proceed with treatment after reviewing the details related to the planned course of therapy. The consent form was witnessed and verified by the simulation staff.  Then, the patient was set-up in a stable reproducible  supine position for radiation therapy.  A BodyFix immobilization pillow was fabricated for reproducible positioning.  Surface markings were placed.  The CT images were loaded into the planning software.  The gross target volumes (GTV) and planning target volumes (PTV) were delinieated, and avoidance structures were contoured.  Treatment planning then occurred.  The radiation prescription was entered and confirmed.  A total of two complex treatment devices were fabricated in the form of the BodyFix immobilization pillow and a neck accuform cushion.  I have requested : 3D Simulation  I have requested a DVH of the following structures: targets and all normal structures near the target including bladder, bowel, skin and others as noted on the radiation plan to maintain doses in adherence with established limits  SPECIAL TREATMENT PROCEDURE:  The planned course of therapy using radiation constitutes a special treatment procedure.  Special care is required in the management of this patient for the following reasons. High dose per fraction requiring special monitoring for increased toxicities of treatment including daily imaging..  The special nature of the planned course of radiotherapy will require increased physician supervision and oversight to ensure patient's safety with optimal treatment outcomes.    This requires extended time and effort.    PLAN:  The patient will receive 50 Gy in 10 fractions to the PET positive node with 30 Gy in 10 fractions to the involved nodal echelon.  ________________________________  Sheral Apley Tammi Klippel, M.D.

## 2022-07-12 DIAGNOSIS — Z510 Encounter for antineoplastic radiation therapy: Secondary | ICD-10-CM | POA: Diagnosis not present

## 2022-07-12 DIAGNOSIS — C775 Secondary and unspecified malignant neoplasm of intrapelvic lymph nodes: Secondary | ICD-10-CM | POA: Diagnosis not present

## 2022-07-12 DIAGNOSIS — Z191 Hormone sensitive malignancy status: Secondary | ICD-10-CM | POA: Diagnosis not present

## 2022-07-12 DIAGNOSIS — C61 Malignant neoplasm of prostate: Secondary | ICD-10-CM | POA: Diagnosis not present

## 2022-07-15 DIAGNOSIS — C61 Malignant neoplasm of prostate: Secondary | ICD-10-CM | POA: Diagnosis not present

## 2022-07-15 DIAGNOSIS — C775 Secondary and unspecified malignant neoplasm of intrapelvic lymph nodes: Secondary | ICD-10-CM | POA: Diagnosis not present

## 2022-07-15 DIAGNOSIS — Z191 Hormone sensitive malignancy status: Secondary | ICD-10-CM | POA: Diagnosis not present

## 2022-07-18 ENCOUNTER — Ambulatory Visit: Payer: Medicare HMO | Admitting: Radiation Oncology

## 2022-07-19 ENCOUNTER — Ambulatory Visit: Payer: Medicare HMO

## 2022-07-22 ENCOUNTER — Ambulatory Visit: Payer: Medicare HMO

## 2022-07-23 ENCOUNTER — Ambulatory Visit: Payer: Medicare HMO

## 2022-07-24 ENCOUNTER — Ambulatory Visit: Payer: Medicare HMO

## 2022-07-25 ENCOUNTER — Ambulatory Visit: Payer: Medicare HMO

## 2022-07-26 ENCOUNTER — Ambulatory Visit: Payer: Medicare HMO

## 2022-07-29 ENCOUNTER — Other Ambulatory Visit: Payer: Self-pay

## 2022-07-29 ENCOUNTER — Ambulatory Visit
Admission: RE | Admit: 2022-07-29 | Discharge: 2022-07-29 | Disposition: A | Discharge status: Home / Self Care | Payer: Medicare HMO | Source: Ambulatory Visit | Attending: Radiation Oncology | Admitting: Radiation Oncology

## 2022-07-29 DIAGNOSIS — C775 Secondary and unspecified malignant neoplasm of intrapelvic lymph nodes: Secondary | ICD-10-CM | POA: Diagnosis not present

## 2022-07-29 DIAGNOSIS — Z191 Hormone sensitive malignancy status: Secondary | ICD-10-CM | POA: Diagnosis not present

## 2022-07-29 DIAGNOSIS — C61 Malignant neoplasm of prostate: Secondary | ICD-10-CM | POA: Diagnosis not present

## 2022-07-29 DIAGNOSIS — Z510 Encounter for antineoplastic radiation therapy: Secondary | ICD-10-CM | POA: Diagnosis not present

## 2022-07-29 LAB — RAD ONC ARIA SESSION SUMMARY
Course Elapsed Days: 0
Plan Fractions Treated to Date: 1
Plan Prescribed Dose Per Fraction: 5 Gy
Plan Total Fractions Prescribed: 10
Plan Total Prescribed Dose: 50 Gy
Reference Point Dosage Given to Date: 5 Gy
Reference Point Session Dosage Given: 5 Gy
Session Number: 1

## 2022-07-30 ENCOUNTER — Ambulatory Visit
Admission: RE | Admit: 2022-07-30 | Discharge: 2022-07-30 | Disposition: A | Discharge status: Home / Self Care | Payer: Medicare HMO | Source: Ambulatory Visit | Attending: Radiation Oncology | Admitting: Radiation Oncology

## 2022-07-30 ENCOUNTER — Other Ambulatory Visit: Payer: Self-pay

## 2022-07-30 DIAGNOSIS — C775 Secondary and unspecified malignant neoplasm of intrapelvic lymph nodes: Secondary | ICD-10-CM | POA: Diagnosis not present

## 2022-07-30 DIAGNOSIS — Z191 Hormone sensitive malignancy status: Secondary | ICD-10-CM | POA: Diagnosis not present

## 2022-07-30 DIAGNOSIS — C61 Malignant neoplasm of prostate: Secondary | ICD-10-CM | POA: Diagnosis not present

## 2022-07-30 DIAGNOSIS — Z510 Encounter for antineoplastic radiation therapy: Secondary | ICD-10-CM | POA: Diagnosis not present

## 2022-07-30 LAB — RAD ONC ARIA SESSION SUMMARY
Course Elapsed Days: 1
Plan Fractions Treated to Date: 2
Plan Prescribed Dose Per Fraction: 5 Gy
Plan Total Fractions Prescribed: 10
Plan Total Prescribed Dose: 50 Gy
Reference Point Dosage Given to Date: 10 Gy
Reference Point Session Dosage Given: 5 Gy
Session Number: 2

## 2022-07-31 ENCOUNTER — Other Ambulatory Visit: Payer: Self-pay

## 2022-07-31 ENCOUNTER — Ambulatory Visit
Admission: RE | Admit: 2022-07-31 | Discharge: 2022-07-31 | Disposition: A | Discharge status: Home / Self Care | Payer: Medicare HMO | Source: Ambulatory Visit | Attending: Radiation Oncology | Admitting: Radiation Oncology

## 2022-07-31 DIAGNOSIS — C61 Malignant neoplasm of prostate: Secondary | ICD-10-CM | POA: Diagnosis not present

## 2022-07-31 DIAGNOSIS — C775 Secondary and unspecified malignant neoplasm of intrapelvic lymph nodes: Secondary | ICD-10-CM | POA: Diagnosis not present

## 2022-07-31 DIAGNOSIS — Z510 Encounter for antineoplastic radiation therapy: Secondary | ICD-10-CM | POA: Diagnosis not present

## 2022-07-31 DIAGNOSIS — Z191 Hormone sensitive malignancy status: Secondary | ICD-10-CM | POA: Diagnosis not present

## 2022-07-31 LAB — RAD ONC ARIA SESSION SUMMARY
Course Elapsed Days: 2
Plan Fractions Treated to Date: 3
Plan Prescribed Dose Per Fraction: 5 Gy
Plan Total Fractions Prescribed: 10
Plan Total Prescribed Dose: 50 Gy
Reference Point Dosage Given to Date: 15 Gy
Reference Point Session Dosage Given: 5 Gy
Session Number: 3

## 2022-08-01 ENCOUNTER — Ambulatory Visit
Admission: RE | Admit: 2022-08-01 | Discharge: 2022-08-01 | Disposition: A | Discharge status: Home / Self Care | Payer: Medicare HMO | Source: Ambulatory Visit | Attending: Radiation Oncology | Admitting: Radiation Oncology

## 2022-08-01 ENCOUNTER — Other Ambulatory Visit: Payer: Self-pay

## 2022-08-01 DIAGNOSIS — C775 Secondary and unspecified malignant neoplasm of intrapelvic lymph nodes: Secondary | ICD-10-CM | POA: Diagnosis not present

## 2022-08-01 DIAGNOSIS — C61 Malignant neoplasm of prostate: Secondary | ICD-10-CM | POA: Diagnosis not present

## 2022-08-01 DIAGNOSIS — Z191 Hormone sensitive malignancy status: Secondary | ICD-10-CM | POA: Diagnosis not present

## 2022-08-01 DIAGNOSIS — Z510 Encounter for antineoplastic radiation therapy: Secondary | ICD-10-CM | POA: Diagnosis not present

## 2022-08-01 LAB — RAD ONC ARIA SESSION SUMMARY
Course Elapsed Days: 3
Plan Fractions Treated to Date: 4
Plan Prescribed Dose Per Fraction: 5 Gy
Plan Total Fractions Prescribed: 10
Plan Total Prescribed Dose: 50 Gy
Reference Point Dosage Given to Date: 20 Gy
Reference Point Session Dosage Given: 5 Gy
Session Number: 4

## 2022-08-02 ENCOUNTER — Other Ambulatory Visit: Payer: Self-pay

## 2022-08-02 ENCOUNTER — Ambulatory Visit
Admission: RE | Admit: 2022-08-02 | Discharge: 2022-08-02 | Disposition: A | Discharge status: Home / Self Care | Payer: Medicare HMO | Source: Ambulatory Visit | Attending: Radiation Oncology | Admitting: Radiation Oncology

## 2022-08-02 DIAGNOSIS — Z191 Hormone sensitive malignancy status: Secondary | ICD-10-CM | POA: Diagnosis not present

## 2022-08-02 DIAGNOSIS — C775 Secondary and unspecified malignant neoplasm of intrapelvic lymph nodes: Secondary | ICD-10-CM | POA: Diagnosis not present

## 2022-08-02 DIAGNOSIS — Z510 Encounter for antineoplastic radiation therapy: Secondary | ICD-10-CM | POA: Diagnosis not present

## 2022-08-02 DIAGNOSIS — C61 Malignant neoplasm of prostate: Secondary | ICD-10-CM | POA: Diagnosis not present

## 2022-08-02 LAB — RAD ONC ARIA SESSION SUMMARY
Course Elapsed Days: 4
Plan Fractions Treated to Date: 5
Plan Prescribed Dose Per Fraction: 5 Gy
Plan Total Fractions Prescribed: 10
Plan Total Prescribed Dose: 50 Gy
Reference Point Dosage Given to Date: 25 Gy
Reference Point Session Dosage Given: 5 Gy
Session Number: 5

## 2022-08-05 ENCOUNTER — Ambulatory Visit
Admission: RE | Admit: 2022-08-05 | Discharge: 2022-08-05 | Disposition: A | Discharge status: Home / Self Care | Payer: Medicare HMO | Source: Ambulatory Visit | Attending: Radiation Oncology | Admitting: Radiation Oncology

## 2022-08-05 ENCOUNTER — Other Ambulatory Visit: Payer: Self-pay

## 2022-08-05 DIAGNOSIS — C61 Malignant neoplasm of prostate: Secondary | ICD-10-CM | POA: Diagnosis not present

## 2022-08-05 DIAGNOSIS — C775 Secondary and unspecified malignant neoplasm of intrapelvic lymph nodes: Secondary | ICD-10-CM | POA: Insufficient documentation

## 2022-08-05 DIAGNOSIS — Z191 Hormone sensitive malignancy status: Secondary | ICD-10-CM | POA: Diagnosis not present

## 2022-08-05 DIAGNOSIS — Z510 Encounter for antineoplastic radiation therapy: Secondary | ICD-10-CM | POA: Diagnosis not present

## 2022-08-05 LAB — RAD ONC ARIA SESSION SUMMARY
Course Elapsed Days: 7
Plan Fractions Treated to Date: 6
Plan Prescribed Dose Per Fraction: 5 Gy
Plan Total Fractions Prescribed: 10
Plan Total Prescribed Dose: 50 Gy
Reference Point Dosage Given to Date: 30 Gy
Reference Point Session Dosage Given: 5 Gy
Session Number: 6

## 2022-08-06 ENCOUNTER — Ambulatory Visit
Admission: RE | Admit: 2022-08-06 | Discharge: 2022-08-06 | Disposition: A | Discharge status: Home / Self Care | Payer: Medicare HMO | Source: Ambulatory Visit | Attending: Radiation Oncology | Admitting: Radiation Oncology

## 2022-08-06 ENCOUNTER — Other Ambulatory Visit: Payer: Self-pay

## 2022-08-06 DIAGNOSIS — C775 Secondary and unspecified malignant neoplasm of intrapelvic lymph nodes: Secondary | ICD-10-CM | POA: Diagnosis not present

## 2022-08-06 DIAGNOSIS — Z510 Encounter for antineoplastic radiation therapy: Secondary | ICD-10-CM | POA: Diagnosis not present

## 2022-08-06 DIAGNOSIS — Z191 Hormone sensitive malignancy status: Secondary | ICD-10-CM | POA: Diagnosis not present

## 2022-08-06 DIAGNOSIS — C61 Malignant neoplasm of prostate: Secondary | ICD-10-CM | POA: Diagnosis not present

## 2022-08-06 LAB — RAD ONC ARIA SESSION SUMMARY
Course Elapsed Days: 8
Plan Fractions Treated to Date: 7
Plan Prescribed Dose Per Fraction: 5 Gy
Plan Total Fractions Prescribed: 10
Plan Total Prescribed Dose: 50 Gy
Reference Point Dosage Given to Date: 35 Gy
Reference Point Session Dosage Given: 5 Gy
Session Number: 7

## 2022-08-07 ENCOUNTER — Other Ambulatory Visit: Payer: Self-pay

## 2022-08-07 ENCOUNTER — Ambulatory Visit
Admission: RE | Admit: 2022-08-07 | Discharge: 2022-08-07 | Disposition: A | Discharge status: Home / Self Care | Payer: Medicare HMO | Source: Ambulatory Visit | Attending: Radiation Oncology | Admitting: Radiation Oncology

## 2022-08-07 DIAGNOSIS — Z510 Encounter for antineoplastic radiation therapy: Secondary | ICD-10-CM | POA: Diagnosis not present

## 2022-08-07 DIAGNOSIS — C775 Secondary and unspecified malignant neoplasm of intrapelvic lymph nodes: Secondary | ICD-10-CM | POA: Diagnosis not present

## 2022-08-07 DIAGNOSIS — Z191 Hormone sensitive malignancy status: Secondary | ICD-10-CM | POA: Diagnosis not present

## 2022-08-07 DIAGNOSIS — C61 Malignant neoplasm of prostate: Secondary | ICD-10-CM | POA: Diagnosis not present

## 2022-08-07 LAB — RAD ONC ARIA SESSION SUMMARY
Course Elapsed Days: 9
Plan Fractions Treated to Date: 8
Plan Prescribed Dose Per Fraction: 5 Gy
Plan Total Fractions Prescribed: 10
Plan Total Prescribed Dose: 50 Gy
Reference Point Dosage Given to Date: 40 Gy
Reference Point Session Dosage Given: 5 Gy
Session Number: 8

## 2022-08-08 ENCOUNTER — Other Ambulatory Visit: Payer: Self-pay

## 2022-08-08 ENCOUNTER — Ambulatory Visit
Admission: RE | Admit: 2022-08-08 | Discharge: 2022-08-08 | Disposition: A | Discharge status: Home / Self Care | Payer: Medicare HMO | Source: Ambulatory Visit | Attending: Radiation Oncology | Admitting: Radiation Oncology

## 2022-08-08 DIAGNOSIS — C61 Malignant neoplasm of prostate: Secondary | ICD-10-CM | POA: Diagnosis not present

## 2022-08-08 DIAGNOSIS — Z191 Hormone sensitive malignancy status: Secondary | ICD-10-CM | POA: Diagnosis not present

## 2022-08-08 DIAGNOSIS — Z510 Encounter for antineoplastic radiation therapy: Secondary | ICD-10-CM | POA: Diagnosis not present

## 2022-08-08 DIAGNOSIS — C775 Secondary and unspecified malignant neoplasm of intrapelvic lymph nodes: Secondary | ICD-10-CM | POA: Diagnosis not present

## 2022-08-08 LAB — RAD ONC ARIA SESSION SUMMARY
Course Elapsed Days: 10
Plan Fractions Treated to Date: 9
Plan Prescribed Dose Per Fraction: 5 Gy
Plan Total Fractions Prescribed: 10
Plan Total Prescribed Dose: 50 Gy
Reference Point Dosage Given to Date: 45 Gy
Reference Point Session Dosage Given: 5 Gy
Session Number: 9

## 2022-08-09 ENCOUNTER — Ambulatory Visit: Payer: Medicare HMO | Admitting: Cardiology

## 2022-08-09 ENCOUNTER — Ambulatory Visit
Admission: RE | Admit: 2022-08-09 | Discharge: 2022-08-09 | Disposition: A | Discharge status: Home / Self Care | Payer: Medicare HMO | Source: Ambulatory Visit | Attending: Radiation Oncology | Admitting: Radiation Oncology

## 2022-08-09 ENCOUNTER — Encounter: Payer: Self-pay | Admitting: Urology

## 2022-08-09 ENCOUNTER — Other Ambulatory Visit: Payer: Self-pay

## 2022-08-09 DIAGNOSIS — C61 Malignant neoplasm of prostate: Secondary | ICD-10-CM | POA: Diagnosis not present

## 2022-08-09 DIAGNOSIS — C775 Secondary and unspecified malignant neoplasm of intrapelvic lymph nodes: Secondary | ICD-10-CM | POA: Diagnosis not present

## 2022-08-09 DIAGNOSIS — Z510 Encounter for antineoplastic radiation therapy: Secondary | ICD-10-CM | POA: Diagnosis not present

## 2022-08-09 DIAGNOSIS — Z191 Hormone sensitive malignancy status: Secondary | ICD-10-CM | POA: Diagnosis not present

## 2022-08-09 LAB — RAD ONC ARIA SESSION SUMMARY
Course Elapsed Days: 11
Plan Fractions Treated to Date: 10
Plan Prescribed Dose Per Fraction: 5 Gy
Plan Total Fractions Prescribed: 10
Plan Total Prescribed Dose: 50 Gy
Reference Point Dosage Given to Date: 50 Gy
Reference Point Session Dosage Given: 5 Gy
Session Number: 10

## 2022-08-28 ENCOUNTER — Other Ambulatory Visit: Payer: Self-pay | Admitting: Cardiology

## 2022-08-29 NOTE — Progress Notes (Signed)
                                                                                                                                                             Patient Name: Dan [name] MRN: [id number] DOB: 1946-02-21 Referring Physician: Irine Seal (Profile Not Attached) Date of Service: 08/09/2022 [facility], [place]                                                        End Of Treatment Note  Diagnoses:  Prostate Cancer  Cancer Staging: 76 y/o male with oligometastatic prostate cancer involving a solitary right pelvic lymph node, current PSA is 3.4, s/p RALP in 2019 and salvage radiation with ST-ADT in 08/2019.   Intent: Curative  Radiation Treatment Dates:  07/29/2022 through 08/09/2022 Site Technique Total Dose (Gy) Dose per Fx (Gy) Completed Fx Beam Energies  Internal Iliac Nodes: Pelvis IMRT 50/50 5 10/10 6XFFF   Narrative: The patient tolerated radiation therapy relatively well. He had some increased nocturia and modest fatigue but otherwise without complaints.  Plan: The patient will receive a call in about one month from the radiation oncology department. He will continue follow up with Dr. Jeffie Pollock as well.   Nicholos Johns, MMS, PA-C [facility] [place]: [phone]  Fax: [phone]

## 2022-10-02 NOTE — Progress Notes (Signed)
  Radiation Oncology         [phone]) [phone] ________________________________  Name: Dan [name] MRN: [id number]  Date of Service: 10/03/2022  DOB: 30-Aug-1946  Post Treatment Telephone Note  Diagnosis:  76 y/o male with oligometastatic prostate cancer involving a solitary right pelvic lymph node, current PSA is 3.4, s/p RALP in 2019 and salvage radiation with ST-ADT in 08/2019.   Intent: Curative  Radiation Treatment Dates:  07/29/2022 through 08/09/2022 Site Technique Total Dose (Gy) Dose per Fx (Gy) Completed Fx Beam Energies  Internal Iliac Nodes: Pelvis       (as documented in provider EOT note)   Pre Treatment IPSS Score: 4 (as documented in the provider consult note)   The patient was available for call today.   Symptoms of fatigue have improved since completing therapy.  Symptoms of bladder changes have improved since completing therapy. Current symptoms include none, and medications for bladder symptoms include none.  Symptoms of bowel changes have improved since completing therapy. Current symptoms include none, and medications for bowel symptoms include none.     Post Treatment IPSS Score: IPSS Questionnaire (AUA-7): Over the past month.   1)  How often have you had a sensation of not emptying your bladder completely after you finish urinating?  0 - Not at all  2)  How often have you had to urinate again less than two hours after you finished urinating? 0 - Not at all  3)  How often have you found you stopped and started again several times when you urinated?  0 - Not at all  4) How difficult have you found it to postpone urination?  0 - Not at all  5) How often have you had a weak urinary stream?  1 - Less than 1 time in 5  6) How often have you had to push or strain to begin urination?  0 - Not at all  7) How many times did you most typically get up to urinate from the time you went to bed until the time you got up in the morning?  3 - 3 times  Total score:   4. Which indicates mild symptoms  0-7 mildly symptomatic   8-19 moderately symptomatic   20-35 severely symptomatic    Patient has a scheduled follow up visit with his urologist, Dr. Jeffie Pollock, on 12/05/2022 at [facility] [place] for ongoing surveillance. He was counseled that PSA levels will be drawn in the urology office, and was reassured that additional time is expected to improve bowel and bladder symptoms. He was encouraged to call back with concerns or questions regarding radiation.  This concludes the interview.   Leandra Kern, LPN

## 2022-10-03 ENCOUNTER — Ambulatory Visit
Admission: RE | Admit: 2022-10-03 | Discharge: 2022-10-03 | Disposition: A | Discharge status: Home / Self Care | Payer: Medicare HMO | Source: Ambulatory Visit | Attending: Urology | Admitting: Urology

## 2022-10-03 DIAGNOSIS — C775 Secondary and unspecified malignant neoplasm of intrapelvic lymph nodes: Secondary | ICD-10-CM

## 2022-10-03 DIAGNOSIS — C61 Malignant neoplasm of prostate: Secondary | ICD-10-CM | POA: Diagnosis not present

## 2022-10-03 NOTE — Addendum Note (Signed)
Encounter addended by: Freeman Caldron, PA-C on: 10/03/2022 3:36 PM  Actions taken: Order list changed

## 2022-11-27 ENCOUNTER — Other Ambulatory Visit: Payer: Medicare HMO

## 2022-12-05 ENCOUNTER — Encounter: Payer: Self-pay | Admitting: Urology

## 2022-12-05 ENCOUNTER — Ambulatory Visit (INDEPENDENT_AMBULATORY_CARE_PROVIDER_SITE_OTHER): Payer: Medicare HMO | Admitting: Urology

## 2022-12-05 ENCOUNTER — Ambulatory Visit: Payer: Medicare HMO | Admitting: Urology

## 2022-12-05 VITALS — BP 147/72 | HR 87

## 2022-12-05 DIAGNOSIS — R9721 Rising PSA following treatment for malignant neoplasm of prostate: Secondary | ICD-10-CM | POA: Diagnosis not present

## 2022-12-05 DIAGNOSIS — Z8546 Personal history of malignant neoplasm of prostate: Secondary | ICD-10-CM | POA: Diagnosis not present

## 2022-12-05 DIAGNOSIS — R319 Hematuria, unspecified: Secondary | ICD-10-CM

## 2022-12-05 DIAGNOSIS — C61 Malignant neoplasm of prostate: Secondary | ICD-10-CM

## 2022-12-05 DIAGNOSIS — N281 Cyst of kidney, acquired: Secondary | ICD-10-CM

## 2022-12-05 NOTE — Progress Notes (Signed)
Subjective: 1. Prostate cancer ([place])   2. Prostate cancer metastatic to intrapelvic lymph node ([place])   3. Rising PSA following treatment for malignant neoplasm of prostate   4. Complex renal cyst    12/05/22: Tyriq returns today in f/u.  He had  a PSMA PET in 8/23 and had a single area of uptake in a right common iliac node.  He had SBRT to the node in 10/23.  His prior treatment had been an RALP followed by salvage RT as noted below.   We don't have a PSA prior to this visit.  His PSA was 3.4 prior to the radiation and 3.1 in December.  He has no pain or weight loss.  He is voiding ok and his UA is clear. He has minimal leakage after voiding.  There was B2 right renal cyst with rim calcification on the PET.   06/06/22: Raul returns today in f/u.  His repeat cytology on 04/04/22 was negative.  He was supposed to have a PSMA PET prior to this visit but that wasn't done.  He has no bone pain and he has gained some weight.  He has had no further hematuria.    04/04/22: Joson returns today in f/u for his history of gross hematuria and prostate cancer with a rising PSA post prostatectomy.   The CT hematuria study showed possible bladder wall thickening and on  my review there was a possible lesion at the left bladder base.  He had no evidence of prostate cancer mets on CT but there is uptake in a left posterior rib on bone scan.  He does report prior rib fractures.  He has some right shoulder uptake as well that is probably arthritis.  He has had no more hematuria.    03/28/22: Cannen is a 77 yo male with a history of prostate cancer that was bulky on the left with Gleason 8 disease on biospy for a PSA of 12.8.  Prostate volume was 64m.  He was treated with RALP with Dr. RDorothyann Pengin 2019 and was found to have T3b N0 M0 Gleason 7(4+3) prostate cancer on surgical path with a nadir PSA post op of 2.7 and a subsequent rise to 5.7 by 05/12/19 [place].  He then had radiation  therapy by Dr. MCharlotta Newtonfor a PSA recurrence in 2020.  He is referred now with hematuria and a cytology that showed cells suspicious for high grade malignancy.  He has a smoker.  His Cr was 1.14 on 02/01/22 and his PSA was 2.2.  It had gotten down to 0.1 after the radiation therapy.  He only had one episode of hematuria the day he saw Dr. DQuillian Quince  His UA is clear today.   He is on only an aspirin for his a fib. He has only rare incontinence.  He has post prostatectomy ED.     ROS:  Review of Systems  Respiratory:  Negative for shortness of breath.   Musculoskeletal:  Positive for joint pain (right shoulder).  All other systems reviewed and are negative.   Allergies  Allergen Reactions   Tamsulosin Hives and Itching    Past Medical History:  Diagnosis Date   BPH (benign prostatic hyperplasia)    COPD (chronic obstructive pulmonary disease) (HCC)    Depression    ED (erectile dysfunction)    Gout    Hyperlipidemia    Hypertension    Loss of hearing    Nocturia  Paroxysmal atrial fibrillation (HCC)    Prostate cancer [facility])     Past Surgical History:  Procedure Laterality Date   CARDIOVASCULAR STRESS TEST  12/31/2004   normal perfuction nuclear study w/ no evidence ischemia /  normal LV function and wall motion , ef 52%   PROSTATECTOMY  2019   TOTAL HIP ARTHROPLASTY      Social History   Socioeconomic History   Marital status: Married    Spouse name: Not on file   Number of children: Not on file   Years of education: Not on file   Highest education level: Not on file  Occupational History   Not on file  Tobacco Use   Smoking status: Every Day    Types: Cigarettes    Start date: 06/12/1967   Smokeless tobacco: Never  Vaping Use   Vaping Use: Never used  Substance and Sexual Activity   Alcohol use: Not Currently   Drug use: Not Currently   Sexual activity: Not on file  Other Topics Concern   Not on file  Social History Narrative   Not on file   Social  Determinants of Health   Financial Resource Strain: Not on file  Food Insecurity: Not on file  Transportation Needs: Not on file  Physical Activity: Not on file  Stress: Not on file  Social Connections: Not on file  Intimate Partner Violence: Not on file    Family History  Problem Relation Age of Onset   Hypertension Mother    Cancer Father        lung   Colon cancer Neg Hx    Inflammatory bowel disease Neg Hx    Pancreatic cancer Neg Hx     Anti-infectives: Anti-infectives (From admission, onward)    None       Current Outpatient Medications  Medication Sig Dispense Refill   albuterol (PROVENTIL HFA;VENTOLIN HFA) 108 (90 Base) MCG/ACT inhaler Inhale 2 puffs into the lungs every 6 (six) hours as needed for wheezing or shortness of breath.     albuterol (PROVENTIL) (2.5 MG/3ML) 0.083% nebulizer solution Take 2.5 mg by nebulization every 6 (six) hours as needed.     allopurinol (ZYLOPRIM) 100 MG tablet Take 100 mg by mouth daily.     Ascorbic Acid (VITAMIN C) 1000 MG tablet Take 1,000 mg by mouth daily.     aspirin EC 81 MG tablet Take 81 mg by mouth daily. Swallow whole.     cholecalciferol (VITAMIN D3) 25 MCG (1000 UNIT) tablet Take 1,000 Units by mouth daily.     colchicine 0.6 MG tablet Take 1 tablet by mouth daily as needed.     diltiazem (CARDIZEM CD) 360 MG 24 hr capsule Take by mouth daily.     folic acid (FOLVITE) 1 MG tablet Take 1 mg by mouth daily.     HYDROcodone-acetaminophen (NORCO/VICODIN) 5-325 MG tablet One tablet every six hours for pain.  Limit 7 days. 20 tablet 0   losartan (COZAAR) 25 MG tablet TAKE 1 TABLET (25 MG TOTAL) BY MOUTH DAILY. 90 tablet 1   potassium chloride (MICRO-K) 10 MEQ CR capsule TAKE 2 CAPSULES BY MOUTH DAILY. 7 capsule 0   Probiotic Product (PROBIOTIC DAILY PO) Take by mouth.     TRELEGY ELLIPTA 200-62.5-25 MCG/ACT AEPB Inhale 1 puff into the lungs daily.     Zinc 50 MG TABS Take 1 tablet by mouth daily.     No current  facility-administered medications for this visit.  Objective: Vital signs in last 24 hours: BP (!) 147/72   Pulse 87   Intake/Output from previous day: No intake/output data recorded. Intake/Output this shift: '@IOTHISSHIFT'$ @   Physical Exam Vitals reviewed.  Constitutional:      Appearance: Normal appearance.  Neurological:     Mental Status: He is alert.     Lab Results:  Results for orders placed or performed in visit on 12/05/22 (from the past 24 hour(s))  Urinalysis, Routine w reflex microscopic     Status: None   Collection Time: 12/05/22  3:18 PM  Result Value Ref Range   Specific Gravity, UA 1.025 1.005 - 1.030   pH, UA 5.5 5.0 - 7.5   Color, UA Yellow Yellow   Appearance Ur Clear Clear   Leukocytes,UA Negative Negative   Protein,UA Negative Negative/Trace   Glucose, UA Negative Negative   Ketones, UA Negative Negative   RBC, UA Negative Negative   Bilirubin, UA Negative Negative   Urobilinogen, Ur 0.2 0.2 - 1.0 mg/dL   Nitrite, UA Negative Negative   Microscopic Examination Comment    Narrative   Performed at:  [facility] [address] [place], [place]  [id number] Lab Director: [place], Phone:  [phone]      BMET No results for input(s): "NA", "K", "CL", "CO2", "GLUCOSE", "BUN", "CREATININE", "CALCIUM" in the last 72 hours. PT/INR No results for input(s): "LABPROT", "INR" in the last 72 hours. ABG No results for input(s): "PHART", "HCO3" in the last 72 hours.  Invalid input(s): "PCO2", "PO2" PSA was 3.1 with Dr. Quillian Quince in 12/23.   UA is clear.  Studies/Results: No results found.  PSMA PET reviewed.  Notes from Dr. Tammi Klippel reviewed.     Assessment/Plan: Prostate cancer with rising PSA s/p RALP and salvage radiation therapy.  He had a right common iliac node on PSMA PET and has had SBRT.   His PSA was down to 3.1 in 12/23 and I will get one today and in 3 months.   Hematuria with positive cytology.   He has  had no further hematuria.  CT hematuria study and cystoscopy.were negative.  UA is clear today.   Complex cyst:  He had a large right lateral renal cyst with rim calcification on the PET scan.    No orders of the defined types were placed in this encounter.    Orders Placed This Encounter  Procedures   Urinalysis, Routine w reflex microscopic   PSA   PSA    Standing Status:   Future    Standing Expiration Date:   06/05/2023     Return in about 3 months (around 03/05/2023) for with PSA.    CC: Dr. Gar Ponto.      Irine Seal 12/06/2022 [phone]

## 2022-12-06 ENCOUNTER — Other Ambulatory Visit: Payer: Self-pay | Admitting: Urology

## 2022-12-06 ENCOUNTER — Telehealth: Payer: Self-pay

## 2022-12-06 DIAGNOSIS — R9721 Rising PSA following treatment for malignant neoplasm of prostate: Secondary | ICD-10-CM

## 2022-12-06 DIAGNOSIS — C61 Malignant neoplasm of prostate: Secondary | ICD-10-CM

## 2022-12-06 LAB — URINALYSIS, ROUTINE W REFLEX MICROSCOPIC
Bilirubin, UA: NEGATIVE
Glucose, UA: NEGATIVE
Ketones, UA: NEGATIVE
Leukocytes,UA: NEGATIVE
Nitrite, UA: NEGATIVE
Protein,UA: NEGATIVE
RBC, UA: NEGATIVE
Specific Gravity, UA: 1.025 (ref 1.005–1.030)
Urobilinogen, Ur: 0.2 mg/dL (ref 0.2–1.0)
pH, UA: 5.5 (ref 5.0–7.5)

## 2022-12-06 LAB — PSA: Prostate Specific Ag, Serum: 5 ng/mL — ABNORMAL HIGH (ref 0.0–4.0)

## 2022-12-06 NOTE — Telephone Encounter (Signed)
I spoke to wife, she is concerned with the pts increasing PSA level.  I informed her that once Dr. Jeffie Pollock reviews those results he will reach out with his recommendations on how to proceed.  Pt's wife voiced understanding

## 2022-12-06 NOTE — Telephone Encounter (Signed)
Patient's wife called to discuss patient's PSA increase.  Please advise.  Call back [phone]

## 2022-12-09 ENCOUNTER — Telehealth: Payer: Self-pay

## 2022-12-09 NOTE — Telephone Encounter (Signed)
-----   Message from Irine Seal, MD sent at 12/06/2022  3:32 PM EST ----- His PSA is up to 5.0 from 3.1 in December.   I have placed an order for a PSMA PET to reassess his condition and he will need f/u with the results.

## 2022-12-09 NOTE — Telephone Encounter (Signed)
Tried calling patient with no answer. Left VM for return call 

## 2022-12-09 NOTE — Telephone Encounter (Signed)
Made wife/patient aware that His PSA is up to 5.0 from 3.1 in December.  An order for a PSMA PET to reassess his condition have been placed and he will need f/u with the results. Wife voiced that insurance contacted her to make her aware that the PSMA PET has been approved. Made wife/patient aware I will follow up our referral coordinator on the status of get PSMA PET scheduled. Wife/patient voiced understanding

## 2022-12-19 ENCOUNTER — Ambulatory Visit (HOSPITAL_COMMUNITY)
Admission: RE | Admit: 2022-12-19 | Discharge: 2022-12-19 | Disposition: A | Discharge status: Home / Self Care | Payer: Medicare HMO | Source: Ambulatory Visit | Attending: Urology | Admitting: Urology

## 2022-12-19 DIAGNOSIS — J439 Emphysema, unspecified: Secondary | ICD-10-CM | POA: Diagnosis not present

## 2022-12-19 DIAGNOSIS — C61 Malignant neoplasm of prostate: Secondary | ICD-10-CM | POA: Insufficient documentation

## 2022-12-19 DIAGNOSIS — J449 Chronic obstructive pulmonary disease, unspecified: Secondary | ICD-10-CM | POA: Diagnosis not present

## 2022-12-19 DIAGNOSIS — I2510 Atherosclerotic heart disease of native coronary artery without angina pectoris: Secondary | ICD-10-CM | POA: Diagnosis not present

## 2022-12-19 DIAGNOSIS — I10 Essential (primary) hypertension: Secondary | ICD-10-CM | POA: Diagnosis not present

## 2022-12-19 DIAGNOSIS — C775 Secondary and unspecified malignant neoplasm of intrapelvic lymph nodes: Secondary | ICD-10-CM | POA: Insufficient documentation

## 2022-12-19 DIAGNOSIS — R9721 Rising PSA following treatment for malignant neoplasm of prostate: Secondary | ICD-10-CM | POA: Insufficient documentation

## 2022-12-19 DIAGNOSIS — M160 Bilateral primary osteoarthritis of hip: Secondary | ICD-10-CM | POA: Diagnosis not present

## 2022-12-19 DIAGNOSIS — R69 Illness, unspecified: Secondary | ICD-10-CM | POA: Diagnosis not present

## 2022-12-19 DIAGNOSIS — E7849 Other hyperlipidemia: Secondary | ICD-10-CM | POA: Diagnosis not present

## 2022-12-19 MED ORDER — PIFLIFOLASTAT F 18 (PYLARIFY) INJECTION
9.0000 | Freq: Once | INTRAVENOUS | Status: AC
  Administered 2022-12-19: 9.11 via INTRAVENOUS

## 2022-12-24 ENCOUNTER — Encounter: Payer: Self-pay | Admitting: Urology

## 2022-12-25 ENCOUNTER — Telehealth: Payer: Self-pay

## 2022-12-25 DIAGNOSIS — J449 Chronic obstructive pulmonary disease, unspecified: Secondary | ICD-10-CM | POA: Diagnosis not present

## 2022-12-25 DIAGNOSIS — R030 Elevated blood-pressure reading, without diagnosis of hypertension: Secondary | ICD-10-CM | POA: Diagnosis not present

## 2022-12-25 DIAGNOSIS — C774 Secondary and unspecified malignant neoplasm of inguinal and lower limb lymph nodes: Secondary | ICD-10-CM | POA: Diagnosis not present

## 2022-12-25 DIAGNOSIS — Z6823 Body mass index (BMI) 23.0-23.9, adult: Secondary | ICD-10-CM | POA: Diagnosis not present

## 2022-12-25 DIAGNOSIS — I739 Peripheral vascular disease, unspecified: Secondary | ICD-10-CM | POA: Diagnosis not present

## 2022-12-25 DIAGNOSIS — C61 Malignant neoplasm of prostate: Secondary | ICD-10-CM | POA: Diagnosis not present

## 2022-12-25 DIAGNOSIS — I4891 Unspecified atrial fibrillation: Secondary | ICD-10-CM | POA: Diagnosis not present

## 2022-12-25 DIAGNOSIS — F17210 Nicotine dependence, cigarettes, uncomplicated: Secondary | ICD-10-CM | POA: Diagnosis not present

## 2022-12-25 DIAGNOSIS — D692 Other nonthrombocytopenic purpura: Secondary | ICD-10-CM | POA: Diagnosis not present

## 2022-12-25 DIAGNOSIS — R002 Palpitations: Secondary | ICD-10-CM | POA: Diagnosis not present

## 2022-12-25 DIAGNOSIS — M109 Gout, unspecified: Secondary | ICD-10-CM | POA: Diagnosis not present

## 2022-12-25 DIAGNOSIS — M353 Polymyalgia rheumatica: Secondary | ICD-10-CM | POA: Diagnosis not present

## 2022-12-25 NOTE — Telephone Encounter (Signed)
Open in error

## 2022-12-25 NOTE — Telephone Encounter (Signed)
Spoke with patient making patient aware that results have been forward to Dr. Jeffie Pollock and will follow up with Dr. Jeffie Pollock when he return to clinic. Patient voiced understanding

## 2022-12-31 ENCOUNTER — Ambulatory Visit
Admission: RE | Admit: 2022-12-31 | Discharge: 2022-12-31 | Disposition: A | Discharge status: Home / Self Care | Payer: Medicare HMO | Source: Ambulatory Visit | Attending: Radiation Oncology | Admitting: Radiation Oncology

## 2022-12-31 ENCOUNTER — Encounter: Payer: Self-pay | Admitting: Radiation Oncology

## 2022-12-31 VITALS — BP 173/64 | HR 82 | Temp 97.2°F | Resp 18 | Ht 70.5 in | Wt 149.1 lb

## 2022-12-31 DIAGNOSIS — I480 Paroxysmal atrial fibrillation: Secondary | ICD-10-CM | POA: Insufficient documentation

## 2022-12-31 DIAGNOSIS — Z923 Personal history of irradiation: Secondary | ICD-10-CM | POA: Diagnosis not present

## 2022-12-31 DIAGNOSIS — C778 Secondary and unspecified malignant neoplasm of lymph nodes of multiple regions: Secondary | ICD-10-CM | POA: Diagnosis not present

## 2022-12-31 DIAGNOSIS — C775 Secondary and unspecified malignant neoplasm of intrapelvic lymph nodes: Secondary | ICD-10-CM | POA: Diagnosis not present

## 2022-12-31 DIAGNOSIS — Z79899 Other long term (current) drug therapy: Secondary | ICD-10-CM | POA: Diagnosis not present

## 2022-12-31 DIAGNOSIS — J439 Emphysema, unspecified: Secondary | ICD-10-CM | POA: Insufficient documentation

## 2022-12-31 DIAGNOSIS — R69 Illness, unspecified: Secondary | ICD-10-CM | POA: Diagnosis not present

## 2022-12-31 DIAGNOSIS — E785 Hyperlipidemia, unspecified: Secondary | ICD-10-CM | POA: Diagnosis not present

## 2022-12-31 DIAGNOSIS — I700 Atherosclerosis of aorta: Secondary | ICD-10-CM | POA: Diagnosis not present

## 2022-12-31 DIAGNOSIS — C61 Malignant neoplasm of prostate: Secondary | ICD-10-CM

## 2022-12-31 DIAGNOSIS — Z7982 Long term (current) use of aspirin: Secondary | ICD-10-CM | POA: Diagnosis not present

## 2022-12-31 DIAGNOSIS — F17210 Nicotine dependence, cigarettes, uncomplicated: Secondary | ICD-10-CM | POA: Insufficient documentation

## 2022-12-31 DIAGNOSIS — I10 Essential (primary) hypertension: Secondary | ICD-10-CM | POA: Diagnosis not present

## 2022-12-31 DIAGNOSIS — R9721 Rising PSA following treatment for malignant neoplasm of prostate: Secondary | ICD-10-CM | POA: Diagnosis not present

## 2022-12-31 NOTE — Progress Notes (Addendum)
GU Location of Tumor / Histology: Prostate Ca  If Prostate Cancer, PSA is (5.0 12/2022)  PSMA PET in 8/23 and had a single area of uptake in a right common iliac node.  He had SBRT to the node in 10/23.  His prior treatment had been an RALP followed by salvage RT as noted below.   We don't have a PSA prior to this visit.  His PSA was 3.4 prior to the radiation and 3.1 in December.  12/19/2022 Dr. Irine Seal NM PET (PSMA) Skull to Mid Thigh CLINICAL DATA:  Prostate carcinoma with biochemical recurrence.  Prostatectomy 2019, radiation 2020, PSA 5.0, Six-month prior 3.4 and 8 months prior 2.2.   FINDINGS: NECK No radiotracer activity in neck lymph nodes.   Incidental CT finding: None.   CHEST No radiotracer accumulation within mediastinal or hilar lymph nodes.  No suspicious pulmonary nodules on the CT scan.   Incidental CT finding: Paraseptal emphysema. Aortic atherosclerosis.  Coronary artery calcifications.   ABDOMEN/PELVIS Prostate: No focal activity in the prostate bed.   Lymph nodes: New radiotracer avid infrarenal retrocaval 2 mm lymph node on image 139/3.   Minimal increase in size of the radiotracer right common iliac lymph node now measuring 3 mm on image 110/3 previously 2 mm.   Liver: No evidence of liver metastasis.   Incidental CT finding: Large fluid density right renal cysts with dense mural calcifications. Aortic atherosclerosis. Ectasia of the infrarenal abdominal aorta measuring 2.7 cm not aneurysmal by size criteria.   SKELETON No focal activity to suggest skeletal metastasis.   Left total hip arthroplasty. Multilevel degenerative change of the spine. Degenerative change of the left hip and bilateral shoulders   IMPRESSION: 1. New radiotracer avid 2 mm infrarenal retrocaval lymph node with minimal increased size of the radiotracer avid right common iliac lymph node. 2. No abnormal radiotracer avidity in the prostatectomy bed. 3. No evidence of radiotracer avid  osseous metastatic disease. 4. Aortic Atherosclerosis (ICD10-I70.0) and Emphysema (ICD10-J43.9).  06/18/2022 Dr. Irine Seal NM PET (PSMA) Skul to Mid Thigh CLINICAL DATA:  Prostate carcinoma with biochemical recurrence.  Prostatectomy 2019. Radiation 2020. PSA equal 3.4 on 06/06/2022.  FINDINGS: NECK No radiotracer activity in neck lymph nodes.   Incidental CT finding: None.   CHEST No radiotracer accumulation within mediastinal or hilar lymph nodes.  No suspicious pulmonary nodules on the CT scan.   Incidental CT finding: None.   ABDOMEN/PELVIS Prostate: No focal activity in the prostate bed.   Lymph nodes: Single focus of radiotracer activity along the RIGHT common iliac vein. While this is in the vicinity of the RIGHT ureter, the activity locates slightly dorsal and associates with a small 2 mm lymph node on image 180/4.   Additionally, there is no urine activity elsewhere within LEFT or RIGHT ureter.   Liver: No evidence of liver metastasis.   Incidental CT finding: Large benign periphery calcified RIGHT renal cyst.   SKELETON Single focus of radiotracer activity associated the posterior aspect of the LEFT ninth rib with SUV max equal 2.7. This low activity is favored related to trauma rather than metastatic disease.   IMPRESSION: 1. No evidence local prostate cancer recurrence in the prostatectomy bed. 2. Single radiotracer avid pelvic pelvic lymph node adjacent to the RIGHT common iliac vein. 3. No evidence of visceral metastasis or skeletal metastasis. 4. Radiotracer activity associated posterior LEFT ninth rib is favored benign posttraumatic.   Past/Anticipated interventions by urology, if any: Next visit 01/02/2023 with Dr. Jeffie Pollock  Dr. Jeffie Pollock Assessment/Plan: Prostate cancer with rising PSA s/p RALP and salvage radiation therapy.  He had a right common iliac node on PSMA PET and has had SBRT.   His PSA was down to 3.1 in 12/23 and I will get one today and in 3 months.     Hematuria with positive cytology.   He has had no further hematuria.  CT hematuria study and cystoscopy.were negative.  UA is clear today.    Complex cyst:  He had a large right lateral renal cyst with rim calcification on the PET scan.     Past/Anticipated interventions by medical oncology, if any: NA  Weight changes, if any:   No  IPSS:  9 SHIM:  5  Bowel/Bladder complaints, if any:  No  Nausea/Vomiting, if any:  No  Pain issues, if any:  0/10  SAFETY ISSUES: Prior radiation? Yes, prostate (2020) Pacemaker/ICD? No Possible current pregnancy? Male Is the patient on methotrexate? No  Current Complaints / other details:

## 2022-12-31 NOTE — Progress Notes (Signed)
Radiation Oncology         [phone] ________________________________  Outpatient Re-Consultation  Name: Dan [name] MRN: [id number]  Date of Service: 12/31/2022 DOB: 19-Apr-1946  [id number], Mitzie Na, MD  Irine Seal, MD   REFERRING PHYSICIAN: Irine Seal, MD  DIAGNOSIS: 77 y/o with oligoprogression of prostate cancer involving an intra-abdominal lymph node with PSA at 5.0, s/p RALP in 2019, salvage radiation with ST-ADT in 08/2019 and recent SBRT right common iliac lymph node in 08/2022.      ICD-10-CM   1. Malignant neoplasm of prostate ([place])  C61     2. Malignant neoplasm of prostate metastatic to intrapelvic lymph node ([place])  C61    C77.5     3. Malignant neoplasm of prostate metastatic to intra-abdominal lymph node ([facility])  C61    C77.2       HISTORY OF PRESENT ILLNESS: Dan [name] is a 77 y.o. male seen at the request of Dr. Jeffie Pollock. We initially met the patient in 06/2022 to discuss treatment of oligometastatic prostate cancer involving a solitary right iliac lymph node that was subsequently treated with UHRT that was completed in 08/2022.  In summary, he was initially diagnosed with Gleason 4+4 in 06/2018 and opted to proceed with RALP with Dr. Dorothyann Peng on 09/22/18. Final pathology showed a stage [id number], Gleason 4+3 prostate cancer. His postoperative PSA remained detectable and rose to 5.7, prompting an Axumin PET scan in 06/2019 that confirmed local recurrence. He had salvage radiation to the prostate fossa and pelvic lymph nodes under the care of Dr. Charlotta Newton in 08/2019 but denies having any concurrent ADT. His PSA initially responded well, reaching a nadir of 0.1 after salvage treatment. Unfortunately, his PSA rose again to 3.4 in 06/2022 prompting a restaging PSMA PET scan on 06/18/22 which showed oligometastatic disease involving a single internal iliac lymph node.  We met the patient on 07/02/22 and treated the PSMA-avid lymph node with a 10 fx course of UHRT. His PSA only decreased  slightly, to 3.1 in 10/2022, but was further elevated at 5.0 when repeated on 12/05/2022. Therefore, a repeat PSMA PET scan was performed on 12/19/22 showing a new radiotracer-avid 2 mm infrarenal retrocaval lymph node and a minimal increased size of the previously treated right common iliac lymph node measuring 3 mm as compared to 2 mm previously but no evidence of recurrence in prostatectomy bed or osseous metastatic disease. He is accompanied today, by his daughter-in-law who is a Marine scientist.  PREVIOUS RADIATION THERAPY: Yes  07/29/22 - 08/09/22: The involved right internal iliac lymph node and nodal echelon was treated to 50 Gy in 10 fractions   07/05/19 - 08/31/19: 4500 cGy in 25 fractions to the fossa and pelvic lymph nodes followed by a boost of 2160 cGy in 12 fractions to the prostate bed for total dose of 6660 cGy in 37 fractions to the prostate bed (Dr. Mariane Masters)   PAST MEDICAL HISTORY:  Past Medical History:  Diagnosis Date   BPH (benign prostatic hyperplasia)    COPD (chronic obstructive pulmonary disease) ([place])    Depression    ED (erectile dysfunction)    Gout    Hyperlipidemia    Hypertension    Loss of hearing    Nocturia    Paroxysmal atrial fibrillation (H[facility])    Prostate cancer ([place])       PAST SURGICAL HISTORY: Past Surgical History:  Procedure Laterality Date   CARDIOVASCULAR STRESS TEST  12/31/2004   normal perfuction nuclear study  w/ no evidence ischemia /  normal LV function and wall motion , ef 52%   PROSTATECTOMY  2019   TOTAL HIP ARTHROPLASTY      FAMILY HISTORY:  Family History  Problem Relation Age of Onset   Hypertension Mother    Cancer Father        lung   Colon cancer Neg Hx    Inflammatory bowel disease Neg Hx    Pancreatic cancer Neg Hx     SOCIAL HISTORY:  Social History   Socioeconomic History   Marital status: Married    Spouse name: Not on file   Number of children: Not on file   Years of education: Not on file   Highest education level:  Not on file  Occupational History   Not on file  Tobacco Use   Smoking status: Every Day    Types: Cigarettes    Start date: 06/12/1967   Smokeless tobacco: Never  Vaping Use   Vaping Use: Never used  Substance and Sexual Activity   Alcohol use: Not Currently   Drug use: Not Currently   Sexual activity: Not on file  Other Topics Concern   Not on file  Social History Narrative   Not on file   Social Determinants of Health   Financial Resource Strain: Not on file  Food Insecurity: No Food Insecurity (12/31/2022)   Hunger Vital Sign    Worried About Running Out of Food in the Last Year: Never true    Ran Out of Food in the Last Year: Never true  Transportation Needs: No Transportation Needs (12/31/2022)   PRAPARE - Hydrologist (Medical): No    Lack of Transportation (Non-Medical): No  Physical Activity: Not on file  Stress: Not on file  Social Connections: Not on file  Intimate Partner Violence: Not At Risk (12/31/2022)   Humiliation, Afraid, Rape, and Kick questionnaire    Fear of Current or Ex-Partner: No    Emotionally Abused: No    Physically Abused: No    Sexually Abused: No    ALLERGIES: Tamsulosin  MEDICATIONS:  Current Outpatient Medications  Medication Sig Dispense Refill   albuterol (PROVENTIL HFA;VENTOLIN HFA) 108 (90 Base) MCG/ACT inhaler Inhale 2 puffs into the lungs every 6 (six) hours as needed for wheezing or shortness of breath.     albuterol (PROVENTIL) (2.5 MG/3ML) 0.083% nebulizer solution Take 2.5 mg by nebulization every 6 (six) hours as needed.     allopurinol (ZYLOPRIM) 100 MG tablet Take 100 mg by mouth daily.     Ascorbic Acid (VITAMIN C) 1000 MG tablet Take 1,000 mg by mouth daily.     aspirin EC 81 MG tablet Take 81 mg by mouth daily. Swallow whole.     cholecalciferol (VITAMIN D3) 25 MCG (1000 UNIT) tablet Take 1,000 Units by mouth daily.     colchicine 0.6 MG tablet Take 1 tablet by mouth daily as needed.      diltiazem (CARDIZEM CD) 360 MG 24 hr capsule Take by mouth daily.     losartan (COZAAR) 25 MG tablet TAKE 1 TABLET (25 MG TOTAL) BY MOUTH DAILY. 90 tablet 1   megestrol (MEGACE) 40 MG/ML suspension Take by mouth.     potassium chloride (MICRO-K) 10 MEQ CR capsule TAKE 2 CAPSULES BY MOUTH DAILY. 7 capsule 0   predniSONE (DELTASONE) 5 MG tablet Take 10 mg by mouth daily.     TRELEGY ELLIPTA 200-62.5-25 MCG/ACT AEPB Inhale 1 puff into  the lungs daily.     Zinc 50 MG TABS Take 1 tablet by mouth daily.     No current facility-administered medications for this encounter.    REVIEW OF SYSTEMS:  On review of systems, the patient reports that he is doing well overall. He denies any chest pain, shortness of breath, cough, fevers, chills, night sweats, unintended weight changes. He denies any bowel or bladder disturbances, and denies abdominal pain, nausea or vomiting. He denies any new musculoskeletal or joint aches or pains. A complete review of systems is obtained and is otherwise negative.    PHYSICAL EXAM:  Wt Readings from Last 3 Encounters:  12/31/22 149 lb 2 oz (67.6 kg)  07/02/22 148 lb 4 oz (67.2 kg)  02/13/22 136 lb (61.7 kg)   Temp Readings from Last 3 Encounters:  12/31/22 (!) 97.2 F (36.2 C) (Temporal)  07/02/22 97.7 F (36.5 C) (Oral)  02/13/22 100 F (37.8 C)   BP Readings from Last 3 Encounters:  12/31/22 (!) 173/64  12/05/22 (!) 147/72  07/02/22 (!) 146/63   Pulse Readings from Last 3 Encounters:  12/31/22 82  12/05/22 87  07/02/22 75   Pain Assessment Pain Score: 0-No pain/10  In general this is a well appearing Caucasian man in no acute distress. He's alert and oriented x4 and appropriate throughout the examination. Cardiopulmonary assessment is negative for acute distress and he exhibits normal effort.     KPS = 100  100 - Normal; no complaints; no evidence of disease. 90   - Able to carry on normal activity; minor signs or symptoms of disease. 80   - Normal  activity with effort; some signs or symptoms of disease. 43   - Cares for self; unable to carry on normal activity or to do active work. 60   - Requires occasional assistance, but is able to care for most of his personal needs. 50   - Requires considerable assistance and frequent medical care. 62   - Disabled; requires special care and assistance. 50   - Severely disabled; hospital admission is indicated although death not imminent. 53   - Very sick; hospital admission necessary; active supportive treatment necessary. 10   - Moribund; fatal processes progressing rapidly. 0     - Dead  Karnofsky DA, Abelmann [place], Craver LS and Burchenal JH [phone]) The use of the nitrogen mustards in the palliative treatment of carcinoma: with particular reference to bronchogenic carcinoma Cancer 1 634-56  LABORATORY DATA:  No results found for: "WBC", "HGB", "HCT", "MCV", "PLT" Lab Results  Component Value Date   NA 139 03/28/2022   K 4.8 03/28/2022   CL 101 03/28/2022   CO2 19 (L) 03/28/2022   No results found for: "ALT", "AST", "GGT", "ALKPHOS", "BILITOT"   RADIOGRAPHY: NM PET (PSMA) SKULL TO MID THIGH  Result Date: 12/20/2022 CLINICAL DATA:  Prostate carcinoma with biochemical recurrence. Prostatectomy 2019, radiation 2020, PSA 5.0, Six-month prior 3.4 and 8 months prior 2.2. EXAM: NUCLEAR MEDICINE PET SKULL BASE TO THIGH TECHNIQUE: 9.11 mCi F18 Piflufolastat (Pylarify) was injected intravenously. Full-ring PET imaging was performed from the skull base to thigh after the radiotracer. CT data was obtained and used for attenuation correction and anatomic localization. COMPARISON:  PET-CT June 18, 2022 FINDINGS: NECK No radiotracer activity in neck lymph nodes. Incidental CT finding: None. CHEST No radiotracer accumulation within mediastinal or hilar lymph nodes. No suspicious pulmonary nodules on the CT scan. Incidental CT finding: Paraseptal emphysema. Aortic atherosclerosis. Coronary artery calcifications.  ABDOMEN/PELVIS Prostate: No focal activity in the prostate bed. Lymph nodes: New radiotracer avid infrarenal retrocaval 2 mm lymph node on image 139/3. Minimal increase in size of the radiotracer right common iliac lymph node now measuring 3 mm on image 110/3 previously 2 mm. Liver: No evidence of liver metastasis. Incidental CT finding: Large fluid density right renal cysts with dense mural calcifications. Aortic atherosclerosis. Ectasia of the infrarenal abdominal aorta measuring 2.7 cm not aneurysmal by size criteria SKELETON No focal activity to suggest skeletal metastasis. Left total hip arthroplasty. Multilevel degenerative change of the spine. Degenerative change of the left hip and bilateral shoulders IMPRESSION: 1. New radiotracer avid 2 mm infrarenal retrocaval lymph node with minimal increased size of the radiotracer avid right common iliac lymph node. 2. No abnormal radiotracer avidity in the prostatectomy bed 3. No evidence of radiotracer avid osseous metastatic disease. 4. Aortic Atherosclerosis (ICD10-I70.0) and Emphysema (ICD10-J43.9). Electronically Signed   By: Dahlia Bailiff M.D.   On: 12/20/2022 14:16      IMPRESSION/PLAN: 1. 77 y.o. man with oligoprogression of prostate cancer involving involving a new retrocaval lymph node with PSA at 5.0, s/p RALP in 2019, salvage radiation with ST-ADT in 08/2019 and recent SBRT right common iliac lymph node in 08/2022.    Today, we talked to the patient and his family about the findings and workup thus far. We reviewed the recent PSMA PET imaging and compared this with the prior PSMA PET scan from 06/2022, together with the patient. We discussed that there is a new, 2 mm tracer avid infrarenal retroperitoneal lymph node and residual mild activity in the previously treated right common iliac node with slight enlargement (from 2 mm to 3 mm at present) but that it is not clear that there is residual active disease in the iliac node versus smoldering disease  that is not active. We discussed the natural history of metastatic prostate cancer and general treatment, highlighting the role of radiotherapy in the management. We discussed the available radiation techniques, and focused on the details and logistics of delivery. The recommendation is for a 10 fraction course of UHRT to the new infrarenal retrocaval lymph node and nodal echelon and re-treatment of the right common iliac node. We reviewed the anticipated acute and late sequelae associated with radiation in this setting. The patient was encouraged to ask questions that were answered to his stated satisfaction.    At the end of our conversation, the patient would like to have a repeat PSA and discuss his options further with Dr. Jeffie Pollock at the time of his upcoming follow up visit on 01/02/23. If the PSA is further elevated, he will likely proceed with the recommended 10 fraction course of UHRT to the infrarenal retrocaval lymph node and nodal echelon and re-treatment of the right common iliac node but, if the PSA is decreased, he prefers to forego re-treatment of the iliac node. He has freely signed written consent to proceed today in the office and a copy of this document will be placed in his medical record. We will share our discussion with Dr. Jeffie Pollock and once the patient reaches a final decision regarding treating only the new retrocaval lymph node and nodal echelon vs treating both the new disease and retreating the right common iliac node, we will proceed with treatment planning accordingly. The patient has our contact information and will let us know once he has made a decision. We enjoyed meeting with him again today and look forward to continuing to participate  in his care.   We personally spent 60 minutes in this encounter including chart review, reviewing radiological studies, meeting face-to-face with the patient, entering orders and completing documentation.    Nicholos Johns, PA-C    Tyler Pita, MD  [facility] Direct Dial: [phone]  Fax: [phone] [email or web address]  Skype  [email or web address]   This document serves as a record of services personally performed by Tyler Pita, MD and Freeman Caldron, PA-C. It was created on their behalf by Wilburn Mylar, a trained medical scribe. The creation of this record is based on the scribe's personal observations and the provider's statements to them. This document has been checked and approved by the attending provider.

## 2023-01-02 ENCOUNTER — Ambulatory Visit: Payer: Medicare HMO | Admitting: Urology

## 2023-01-02 ENCOUNTER — Encounter: Payer: Self-pay | Admitting: Urology

## 2023-01-02 ENCOUNTER — Encounter: Payer: Self-pay | Admitting: Radiology

## 2023-01-02 VITALS — BP 156/74 | HR 76 | Ht 70.5 in | Wt 149.0 lb

## 2023-01-02 DIAGNOSIS — N281 Cyst of kidney, acquired: Secondary | ICD-10-CM | POA: Diagnosis not present

## 2023-01-02 DIAGNOSIS — R9721 Rising PSA following treatment for malignant neoplasm of prostate: Secondary | ICD-10-CM | POA: Diagnosis not present

## 2023-01-02 DIAGNOSIS — C775 Secondary and unspecified malignant neoplasm of intrapelvic lymph nodes: Secondary | ICD-10-CM | POA: Diagnosis not present

## 2023-01-02 DIAGNOSIS — Z8546 Personal history of malignant neoplasm of prostate: Secondary | ICD-10-CM

## 2023-01-02 DIAGNOSIS — C61 Malignant neoplasm of prostate: Secondary | ICD-10-CM

## 2023-01-02 NOTE — Progress Notes (Signed)
Subjective: 1. Prostate cancer ([place])   2. Prostate cancer metastatic to intrapelvic lymph node ([place])   3. Rising PSA following treatment for malignant neoplasm of prostate   4. Complex renal cyst    2/92/24: Guled returns today in f/u.  His PSA was up to 5.0 on 12/05/22.  A repeat PET showed uptake in a 67m retrocaval node and residual activity in the treated ext iliac node.  He has seen Dr. MTammi Klippeland is planning possible additional SBRT to the new node but he is reluctant to retreat the iliac node.   He has no weight loss or bone pain.   12/05/22: RJefferiereturns today in f/u.  He had  a PSMA PET in 8/23 and had a single area of uptake in a right common iliac node.  He had SBRT to the node in 10/23.  His prior treatment had been an RALP followed by salvage RT as noted below.   We don't have a PSA prior to this visit.  His PSA was 3.4 prior to the radiation and 3.1 in December.  He has no pain or weight loss.  He is voiding ok and his UA is clear. He has minimal leakage after voiding.  There was B2 right renal cyst with rim calcification on the PET.   06/06/22: RHisaoreturns today in f/u.  His repeat cytology on 04/04/22 was negative.  He was supposed to have a PSMA PET prior to this visit but that wasn't done.  He has no bone pain and he has gained some weight.  He has had no further hematuria.    04/04/22: RAitanreturns today in f/u for his history of gross hematuria and prostate cancer with a rising PSA post prostatectomy.   The CT hematuria study showed possible bladder wall thickening and on  my review there was a possible lesion at the left bladder base.  He had no evidence of prostate cancer mets on CT but there is uptake in a left posterior rib on bone scan.  He does report prior rib fractures.  He has some right shoulder uptake as well that is probably arthritis.  He has had no more hematuria.    03/28/22: RDilinis a 77yo male with a history of prostate cancer that was bulky on the left with Gleason 8  disease on biospy for a PSA of 12.8.  Prostate volume was 246m  He was treated with RALP with Dr. RaDorothyann Pengn 2019 and was found to have T3b N0 M0 Gleason 7(4+3) prostate cancer on surgical path with a nadir PSA post op of 2.7 and a subsequent rise to 5.7 by 05/12/19 and an AxLacledeET that only showed prostate bed uptake.  He then had radiation therapy by Dr. MoCharlotta Newtonor a PSA recurrence in 2020.  He is referred now with hematuria and a cytology that showed cells suspicious for high grade malignancy.  He has a smoker.  His Cr was 1.14 on 02/01/22 and his PSA was 2.2.  It had gotten down to 0.1 after the radiation therapy.  He only had one episode of hematuria the day he saw Dr. DaQuillian Quince His UA is clear today.   He is on only an aspirin for his a fib. He has only rare incontinence.  He has post prostatectomy ED.     ROS:  Review of Systems  Respiratory:  Negative for shortness of breath.   Musculoskeletal:  Positive for joint pain (right shoulder).  All other systems  reviewed and are negative.   Allergies  Allergen Reactions   Tamsulosin Hives and Itching    Past Medical History:  Diagnosis Date   BPH (benign prostatic hyperplasia)    COPD (chronic obstructive pulmonary disease) (HCC)    Depression    ED (erectile dysfunction)    Gout    Hyperlipidemia    Hypertension    Loss of hearing    Nocturia    Paroxysmal atrial fibrillation (HCC)    Prostate cancer [facility])     Past Surgical History:  Procedure Laterality Date   CARDIOVASCULAR STRESS TEST  12/31/2004   normal perfuction nuclear study w/ no evidence ischemia /  normal LV function and wall motion , ef 52%   PROSTATECTOMY  2019   TOTAL HIP ARTHROPLASTY      Social History   Socioeconomic History   Marital status: Married    Spouse name: Not on file   Number of children: Not on file   Years of education: Not on file   Highest education level: Not on file  Occupational History   Not on file  Tobacco Use   Smoking status:  Every Day    Types: Cigarettes    Start date: 06/12/1967   Smokeless tobacco: Never  Vaping Use   Vaping Use: Never used  Substance and Sexual Activity   Alcohol use: Not Currently   Drug use: Not Currently   Sexual activity: Not on file  Other Topics Concern   Not on file  Social History Narrative   Not on file   Social Determinants of Health   Financial Resource Strain: Not on file  Food Insecurity: No Food Insecurity (12/31/2022)   Hunger Vital Sign    Worried About Running Out of Food in the Last Year: Never true    Ran Out of Food in the Last Year: Never true  Transportation Needs: No Transportation Needs (12/31/2022)   PRAPARE - Hydrologist (Medical): No    Lack of Transportation (Non-Medical): No  Physical Activity: Not on file  Stress: Not on file  Social Connections: Not on file  Intimate Partner Violence: Not At Risk (12/31/2022)   Humiliation, Afraid, Rape, and Kick questionnaire    Fear of Current or Ex-Partner: No    Emotionally Abused: No    Physically Abused: No    Sexually Abused: No    Family History  Problem Relation Age of Onset   Hypertension Mother    Cancer Father        lung   Colon cancer Neg Hx    Inflammatory bowel disease Neg Hx    Pancreatic cancer Neg Hx     Anti-infectives: Anti-infectives (From admission, onward)    None       Current Outpatient Medications  Medication Sig Dispense Refill   albuterol (PROVENTIL HFA;VENTOLIN HFA) 108 (90 Base) MCG/ACT inhaler Inhale 2 puffs into the lungs every 6 (six) hours as needed for wheezing or shortness of breath.     albuterol (PROVENTIL) (2.5 MG/3ML) 0.083% nebulizer solution Take 2.5 mg by nebulization every 6 (six) hours as needed.     allopurinol (ZYLOPRIM) 100 MG tablet Take 100 mg by mouth daily.     Ascorbic Acid (VITAMIN C) 1000 MG tablet Take 1,000 mg by mouth daily.     aspirin EC 81 MG tablet Take 81 mg by mouth daily. Swallow whole.      cholecalciferol (VITAMIN D3) 25 MCG (1000 UNIT) tablet Take 1,000  Units by mouth daily.     colchicine 0.6 MG tablet Take 1 tablet by mouth daily as needed.     diltiazem (CARDIZEM CD) 360 MG 24 hr capsule Take by mouth daily.     losartan (COZAAR) 25 MG tablet TAKE 1 TABLET (25 MG TOTAL) BY MOUTH DAILY. 90 tablet 1   megestrol (MEGACE) 40 MG/ML suspension Take by mouth.     potassium chloride (MICRO-K) 10 MEQ CR capsule TAKE 2 CAPSULES BY MOUTH DAILY. 7 capsule 0   predniSONE (DELTASONE) 5 MG tablet Take 10 mg by mouth daily.     TRELEGY ELLIPTA 200-62.5-25 MCG/ACT AEPB Inhale 1 puff into the lungs daily.     Zinc 50 MG TABS Take 1 tablet by mouth daily.     No current facility-administered medications for this visit.     Objective: Vital signs in last 24 hours: BP (!) 156/74   Pulse 76   Ht 5' 10.5" (1.791 m)   Wt 149 lb (67.6 kg)   BMI 21.08 kg/m   Intake/Output from previous day: No intake/output data recorded. Intake/Output this shift: '@IOTHISSHIFT'$ @   Physical Exam Vitals reviewed.  Constitutional:      Appearance: Normal appearance.  Neurological:     Mental Status: He is alert.     Lab Results:  Results for orders placed or performed in visit on 01/02/23 (from the past 24 hour(s))  PSA     Status: Abnormal   Collection Time: 01/02/23  2:37 PM  Result Value Ref Range   Prostate Specific Ag, Serum 6.9 (H) 0.0 - 4.0 ng/mL   Narrative   Performed at:  [address], [place], [place]  [id number] Lab Director: Rush Farmer MD, Phone:  [phone]  Testosterone     Status: None   Collection Time: 01/02/23  2:37 PM  Result Value Ref Range   Testosterone 310 264 - 916 ng/dL   Narrative   Performed at:  [facility] [address] [place], [place]  [id number] Lab Director: Rush Farmer MD, Phone:  [phone]       BMET No results for input(s): "NA", "K", "CL", "CO2", "GLUCOSE", "BUN", "CREATININE", "CALCIUM" in the last  72 hours. PT/INR No results for input(s): "LABPROT", "INR" in the last 72 hours. ABG No results for input(s): "PHART", "HCO3" in the last 72 hours.  Invalid input(s): "PCO2", "PO2" PSA was 3.1 with Dr. Quillian Quince in 12/23.   UA is clear.  Studies/Results: NM PET (PSMA) SKULL TO MID THIGH  Result Date: 12/20/2022 CLINICAL DATA:  Prostate carcinoma with biochemical recurrence. Prostatectomy 2019, radiation 2020, PSA 5.0, Six-month prior 3.4 and 8 months prior 2.2. EXAM: NUCLEAR MEDICINE PET SKULL BASE TO THIGH TECHNIQUE: 9.11 mCi F18 Piflufolastat (Pylarify) was injected intravenously. Full-ring PET imaging was performed from the skull base to thigh after the radiotracer. CT data was obtained and used for attenuation correction and anatomic localization. COMPARISON:  PET-CT June 18, 2022 FINDINGS: NECK No radiotracer activity in neck lymph nodes. Incidental CT finding: None. CHEST No radiotracer accumulation within mediastinal or hilar lymph nodes. No suspicious pulmonary nodules on the CT scan. Incidental CT finding: Paraseptal emphysema. Aortic atherosclerosis. Coronary artery calcifications. ABDOMEN/PELVIS Prostate: No focal activity in the prostate bed. Lymph nodes: New radiotracer avid infrarenal retrocaval 2 mm lymph node on image 139/3. Minimal increase in size of the radiotracer right common iliac lymph node now measuring 3 mm on image 110/3 previously 2 mm. Liver: No evidence of liver metastasis.  Incidental CT finding: Large fluid density right renal cysts with dense mural calcifications. Aortic atherosclerosis. Ectasia of the infrarenal abdominal aorta measuring 2.7 cm not aneurysmal by size criteria SKELETON No focal activity to suggest skeletal metastasis. Left total hip arthroplasty. Multilevel degenerative change of the spine. Degenerative change of the left hip and bilateral shoulders IMPRESSION: 1. New radiotracer avid 2 mm infrarenal retrocaval lymph node with minimal increased size of the  radiotracer avid right common iliac lymph node. 2. No abnormal radiotracer avidity in the prostatectomy bed 3. No evidence of radiotracer avid osseous metastatic disease. 4. Aortic Atherosclerosis (ICD10-I70.0) and Emphysema (ICD10-J43.9). Electronically Signed   By: Dahlia Bailiff M.D.   On: 12/20/2022 14:16    PSMA PET reviewed.  Notes from Dr. Tammi Klippel reviewed.     Assessment/Plan: Prostate cancer with rising PSA s/p RALP and salvage radiation therapy.  He has a rising PSA with residual activity in the right common iliac node on PSMA PET and a new 22m retrocaval node.  He would like to repeat a PSA today.  If the PSA is up further I think he should proceed with the additional SBRT to the retrocaval node and also consider 6 months of ADT with Orgovyx or firmagon along with an ARB. (The PSA from 2/29 is back and is 6.9 which is a further increase.  He will need ADT and the SBRT.  I will also need to get him set up with oncology for consideration of an ARB or ABI.)  Hx of hematuria with positive cytology.   He has had no further hematuria.  CT hematuria study and cystoscopy.were negative.  UA is clear today.   Complex cyst:  He had a large right lateral renal cyst with rim calcification on the PET scan.    No orders of the defined types were placed in this encounter.    Orders Placed This Encounter  Procedures   PSA   Testosterone     Return for Keep f/u in May with PSA. .Marland Kitchen   CC: Dr. TGar Ponto      JIrine Seal3/11/2022 [phone]

## 2023-01-03 ENCOUNTER — Telehealth: Payer: Self-pay | Admitting: Radiation Oncology

## 2023-01-03 ENCOUNTER — Encounter: Payer: Self-pay | Admitting: Urology

## 2023-01-03 ENCOUNTER — Telehealth: Payer: Self-pay

## 2023-01-03 LAB — PSA: Prostate Specific Ag, Serum: 6.9 ng/mL — ABNORMAL HIGH (ref 0.0–4.0)

## 2023-01-03 LAB — TESTOSTERONE: Testosterone: 310 ng/dL (ref 264–916)

## 2023-01-03 NOTE — Telephone Encounter (Signed)
-----   Message from Irine Seal, MD sent at 01/03/2023 10:05 AM EST ----- His PSA is up further so he will need to return to discuss hormone suppression in more detail and will need to proceed with Dr. Tammi Klippel.   ----- Message ----- From: Audie Box, CMA Sent: 01/03/2023   8:52 AM EST To: Irine Seal, MD  Please review .

## 2023-01-03 NOTE — Telephone Encounter (Signed)
RN called patient back spoke with wife Barnett Applebaum Rahn), she anxious and worried that Mr. Brimage wont' be able to start treatment was a bit confused as to why Mr. Pare needed to speak with Dr. Jeffie Pollock.  Expressed to Mrs. Prichett that Dr. Ralene Muskrat nurse will call her to follow up start date for ADT therapy she stated that she hadn't heard back from anyone at his office and will call them on Monday morning.  This writer will follow up with patient and wife on 01/06/2023.

## 2023-01-03 NOTE — Telephone Encounter (Signed)
I informed pt's wife of MD's response.  She states they have already scheduled and apt with Dr. Johny Shears office.

## 2023-01-03 NOTE — Telephone Encounter (Signed)
3/1 Medical Records/Treatment records received from [facility].  Also scan to media tab.

## 2023-01-03 NOTE — Telephone Encounter (Signed)
RN called patient spoke with wife about starting treatment.  Per Ashlyn Bruning,PA-C follow up to see if he had started ADT injections or Orgovyx. Manson Zahler (wife) stated during their visit with Dr. Jeffie Pollock yesterday they haven't talked about or started ADT injections that they were told they will need to talk to Dr. Johny Shears office to get started.  Mrs. Aramburo said she did receive a call from CT sim office about appointment 01/06/2023 but that's all she knows. Ashlyn Bruning, PA-C was made aware if this conversation.

## 2023-01-03 NOTE — Telephone Encounter (Signed)
3/1 @ 11:06 am sent stat fax request for previous treatment/dosimetry and planning records from [facility] from 2020 -per [employer]. Waiting on records.

## 2023-01-05 DIAGNOSIS — C61 Malignant neoplasm of prostate: Secondary | ICD-10-CM | POA: Diagnosis not present

## 2023-01-05 DIAGNOSIS — R9721 Rising PSA following treatment for malignant neoplasm of prostate: Secondary | ICD-10-CM | POA: Diagnosis not present

## 2023-01-05 DIAGNOSIS — C775 Secondary and unspecified malignant neoplasm of intrapelvic lymph nodes: Secondary | ICD-10-CM | POA: Diagnosis not present

## 2023-01-06 ENCOUNTER — Telehealth: Payer: Self-pay | Admitting: Dietician

## 2023-01-06 ENCOUNTER — Ambulatory Visit
Admission: RE | Admit: 2023-01-06 | Discharge: 2023-01-06 | Disposition: A | Discharge status: Home / Self Care | Payer: Medicare HMO | Source: Ambulatory Visit | Attending: Radiation Oncology | Admitting: Radiation Oncology

## 2023-01-06 DIAGNOSIS — C61 Malignant neoplasm of prostate: Secondary | ICD-10-CM | POA: Diagnosis not present

## 2023-01-06 DIAGNOSIS — C775 Secondary and unspecified malignant neoplasm of intrapelvic lymph nodes: Secondary | ICD-10-CM

## 2023-01-06 DIAGNOSIS — C778 Secondary and unspecified malignant neoplasm of lymph nodes of multiple regions: Secondary | ICD-10-CM | POA: Insufficient documentation

## 2023-01-06 DIAGNOSIS — Z510 Encounter for antineoplastic radiation therapy: Secondary | ICD-10-CM | POA: Insufficient documentation

## 2023-01-06 DIAGNOSIS — R9721 Rising PSA following treatment for malignant neoplasm of prostate: Secondary | ICD-10-CM | POA: Diagnosis not present

## 2023-01-06 NOTE — Telephone Encounter (Signed)
Scheduled appt per 3/4 referral. Called pt, no answer. Left msg with appt date/time. Let pt know this would be a telephone appt.

## 2023-01-06 NOTE — Progress Notes (Signed)
RN spoke with staff at [facility] in [place] and they are working on authorization for patient to start Mills Koller, and patient is scheduled for MedOnc Consult with Dr. Delton Coombes on 3/12.   RN spoke with patient's wife to update her on the status of receiving ADT.  Pt's wife also requested to have appointment with dietician to discuss healthy eating options for patient.  Referral placed.    Plan of care in progress.

## 2023-01-07 NOTE — Progress Notes (Signed)
  Radiation Oncology         [phone] ________________________________  Name: Dan [name] MRN: [id number]  Date: 01/06/2023  DOB: [birth year]  ULTRAHYPOFRACTIONATED RADIOTHERAPY  SIMULATION AND TREATMENT PLANNING NOTE    ICD-10-CM   1. Malignant neoplasm of prostate metastatic to intrapelvic lymph node ([place])  C61    C77.5     2. Malignant neoplasm of prostate metastatic to intra-abdominal lymph node ([facility])  C61    C77.2       DIAGNOSIS:  77 y/o with oligoprogression of prostate cancer involving an intra-abdominal lymph node with PSA at 5.0, s/p RALP in 2019, salvage radiation with ST-ADT in 08/2019 and recent SBRT right common iliac lymph node in 08/2022.  NARRATIVE:  The patient was brought to the [facility].  Identity was confirmed.  All relevant records and images related to the planned course of therapy were reviewed.  The patient freely provided informed written consent to proceed with treatment after reviewing the details related to the planned course of therapy. The consent form was witnessed and verified by the simulation staff.  Then, the patient was set-up in a stable reproducible  supine position for radiation therapy.  A BodyFix immobilization pillow was fabricated for reproducible positioning.  Surface markings were placed.  The CT images were loaded into the planning software.  The gross target volumes (GTV) and planning target volumes (PTV) were delinieated, and avoidance structures were contoured.  Treatment planning then occurred.  The radiation prescription was entered and confirmed.  A total of two complex treatment devices were fabricated in the form of the BodyFix immobilization pillow and a neck accuform cushion.  I have requested : 3D Simulation  I have requested a DVH of the following structures: targets and all normal structures near the target including bowel, bladder, kidneys, skin and others as noted on the radiation plan to maintain doses in  adherence with established limits  SPECIAL TREATMENT PROCEDURE:  The planned course of therapy using radiation constitutes a special treatment procedure. Special care is required in the management of this patient for the following reasons. High dose per fraction requiring special monitoring for increased toxicities of treatment including daily imaging..  The special nature of the planned course of radiotherapy will require increased physician supervision and oversight to ensure patient's safety with optimal treatment outcomes.    This requires extended time and effort.    PLAN:  The patient will receive 50 Gy in 10 fractions to the PET positive paraaortic node, 40 Gy in 10 fractions to the previously treated PET avid pelvic node and 30 Gy in 10 fractions to the nodal echelon stations involved.  ________________________________  Sheral Apley Tammi Klippel, M.D.

## 2023-01-09 ENCOUNTER — Ambulatory Visit: Payer: Medicare HMO | Admitting: Urology

## 2023-01-09 ENCOUNTER — Ambulatory Visit (INDEPENDENT_AMBULATORY_CARE_PROVIDER_SITE_OTHER): Payer: Medicare HMO | Admitting: Urology

## 2023-01-09 VITALS — BP 152/75 | HR 71 | Ht 70.5 in | Wt 149.0 lb

## 2023-01-09 DIAGNOSIS — C61 Malignant neoplasm of prostate: Secondary | ICD-10-CM

## 2023-01-09 DIAGNOSIS — C775 Secondary and unspecified malignant neoplasm of intrapelvic lymph nodes: Secondary | ICD-10-CM | POA: Diagnosis not present

## 2023-01-09 MED ORDER — DEGARELIX ACETATE(240 MG DOSE) 120 MG/VIAL ~~LOC~~ SOLR
240.0000 mg | Freq: Once | SUBCUTANEOUS | Status: AC
  Administered 2023-01-09: 240 mg via SUBCUTANEOUS

## 2023-01-09 NOTE — Progress Notes (Signed)
Firmagon Sub Q Injection  Due to Prostate Cancer patient is present today for a Firmagon Injection.   Medication: Mills Koller (Degarelix)  Dose: '240mg'$  Location: left and right upper abdomen Lot: [id number] Exp: 09/04/2024  Patient tolerated well, no complications were noted  Performed by: Marisue Brooklyn, CMA  Follow up: Follow up as scheduled

## 2023-01-09 NOTE — Progress Notes (Signed)
Subjective: 1. Prostate cancer ([place])   2. Prostate cancer metastatic to intrapelvic lymph node ([place])    2/92/24: Dan [name] returns today in f/u.  His PSA was up to 5.0 on 12/05/22.  A repeat PET showed uptake in a 77m retrocaval node and residual activity in the treated ext iliac node.  He has seen Dr. MTammi Klippeland is planning possible additional SBRT to the new node but he is reluctant to retreat the iliac node.   He has no weight loss or bone pain.   12/05/22: RHumbertreturns today in f/u.  He had  a PSMA PET in 8/23 and had a single area of uptake in a right common iliac node.  He had SBRT to the node in 10/23.  His prior treatment had been an RALP followed by salvage RT as noted below.   We don't have a PSA prior to this visit.  His PSA was 3.4 prior to the radiation and 3.1 in December.  He has no pain or weight loss.  He is voiding ok and his UA is clear. He has minimal leakage after voiding.  There was B2 right renal cyst with rim calcification on the PET.   06/06/22: RSwayzereturns today in f/u.  His repeat cytology on 04/04/22 was negative.  He was supposed to have a PSMA PET prior to this visit but that wasn't done.  He has no bone pain and he has gained some weight.  He has had no further hematuria.    04/04/22: RKeanthonyreturns today in f/u for his history of gross hematuria and prostate cancer with a rising PSA post prostatectomy.   The CT hematuria study showed possible bladder wall thickening and on  my review there was a possible lesion at the left bladder base.  He had no evidence of prostate cancer mets on CT but there is uptake in a left posterior rib on bone scan.  He does report prior rib fractures.  He has some right shoulder uptake as well that is probably arthritis.  He has had no more hematuria.    03/28/22: Dan [name] a 77yo male with a history of prostate cancer that was bulky on the left with Gleason 8 disease on biospy for a PSA of 12.8.  Prostate volume was 269m  He was treated with RALP with  Dr. RaDorothyann Pengn 2019 and was found to have T3b N0 M0 Gleason 7(4+3) prostate cancer on surgical path with a nadir PSA post op of 2.7 and a subsequent rise to 5.7 by 05/12/19 and [place] that only showed prostate bed uptake.  He then had radiation therapy by Dr. MoCharlotta Newtonor a PSA recurrence in 2020.  He is referred now with hematuria and a cytology that showed cells suspicious for high grade malignancy.  He has a smoker.  His Cr was 1.14 on 02/01/22 and his PSA was 2.2.  It had gotten down to 0.1 after the radiation therapy.  He only had one episode of hematuria the day he saw Dr. DaQuillian Quince His UA is clear today.   He is on only an aspirin for his a fib. He has only rare incontinence.  He has post prostatectomy ED.     ROS:  Review of Systems  Respiratory:  Negative for shortness of breath.   Musculoskeletal:  Positive for joint pain (right shoulder).  All other systems reviewed and are negative.   Allergies  Allergen Reactions   Tamsulosin Hives and Itching  Past Medical History:  Diagnosis Date   BPH (benign prostatic hyperplasia)    COPD (chronic obstructive pulmonary disease) (HCC)    Depression    ED (erectile dysfunction)    Gout    Hyperlipidemia    Hypertension    Loss of hearing    Nocturia    Paroxysmal atrial fibrillation (HCC)    Prostate cancer [facility])     Past Surgical History:  Procedure Laterality Date   CARDIOVASCULAR STRESS TEST  12/31/2004   normal perfuction nuclear study w/ no evidence ischemia /  normal LV function and wall motion , ef 52%   PROSTATECTOMY  2019   TOTAL HIP ARTHROPLASTY      Social History   Socioeconomic History   Marital status: Married    Spouse name: Not on file   Number of children: Not on file   Years of education: Not on file   Highest education level: Not on file  Occupational History   Not on file  Tobacco Use   Smoking status: Every Day    Types: Cigarettes    Start date: 06/12/1967   Smokeless tobacco: Never  Vaping  Use   Vaping Use: Never used  Substance and Sexual Activity   Alcohol use: Not Currently   Drug use: Not Currently   Sexual activity: Not on file  Other Topics Concern   Not on file  Social History Narrative   Not on file   Social Determinants of Health   Financial Resource Strain: Not on file  Food Insecurity: No Food Insecurity (12/31/2022)   Hunger Vital Sign    Worried About Running Out of Food in the Last Year: Never true    Ran Out of Food in the Last Year: Never true  Transportation Needs: No Transportation Needs (12/31/2022)   PRAPARE - Hydrologist (Medical): No    Lack of Transportation (Non-Medical): No  Physical Activity: Not on file  Stress: Not on file  Social Connections: Not on file  Intimate Partner Violence: Not At Risk (12/31/2022)   Humiliation, Afraid, Rape, and Kick questionnaire    Fear of Current or Ex-Partner: No    Emotionally Abused: No    Physically Abused: No    Sexually Abused: No    Family History  Problem Relation Age of Onset   Hypertension Mother    Cancer Father        lung   Colon cancer Neg Hx    Inflammatory bowel disease Neg Hx    Pancreatic cancer Neg Hx     Anti-infectives: Anti-infectives (From admission, onward)    None       Current Outpatient Medications  Medication Sig Dispense Refill   albuterol (PROVENTIL HFA;VENTOLIN HFA) 108 (90 Base) MCG/ACT inhaler Inhale 2 puffs into the lungs every 6 (six) hours as needed for wheezing or shortness of breath.     albuterol (PROVENTIL) (2.5 MG/3ML) 0.083% nebulizer solution Take 2.5 mg by nebulization every 6 (six) hours as needed.     allopurinol (ZYLOPRIM) 100 MG tablet Take 100 mg by mouth daily.     Ascorbic Acid (VITAMIN C) 1000 MG tablet Take 1,000 mg by mouth daily.     aspirin EC 81 MG tablet Take 81 mg by mouth daily. Swallow whole.     cholecalciferol (VITAMIN D3) 25 MCG (1000 UNIT) tablet Take 1,000 Units by mouth daily.     colchicine  0.6 MG tablet Take 1 tablet by mouth daily as  needed.     diltiazem (CARDIZEM CD) 360 MG 24 hr capsule Take by mouth daily.     losartan (COZAAR) 25 MG tablet TAKE 1 TABLET (25 MG TOTAL) BY MOUTH DAILY. 90 tablet 1   megestrol (MEGACE) 40 MG/ML suspension Take by mouth.     potassium chloride (MICRO-K) 10 MEQ CR capsule TAKE 2 CAPSULES BY MOUTH DAILY. 7 capsule 0   predniSONE (DELTASONE) 5 MG tablet Take 10 mg by mouth daily.     TRELEGY ELLIPTA 200-62.5-25 MCG/ACT AEPB Inhale 1 puff into the lungs daily.     Zinc 50 MG TABS Take 1 tablet by mouth daily.     No current facility-administered medications for this visit.     Objective: Vital signs in last 24 hours: BP (!) 152/75   Pulse 71   Ht 5' 10.5" (1.791 m)   Wt 149 lb (67.6 kg)   BMI 21.08 kg/m   Intake/Output from previous day: No intake/output data recorded. Intake/Output this shift: '@IOTHISSHIFT'$ @   Physical Exam Vitals reviewed.  Constitutional:      Appearance: Normal appearance.  Neurological:     Mental Status: He is alert.     Lab Results:  No results found for this or any previous visit (from the past 24 hour(s)).      BMET No results for input(s): "NA", "K", "CL", "CO2", "GLUCOSE", "BUN", "CREATININE", "CALCIUM" in the last 72 hours. PT/INR No results for input(s): "LABPROT", "INR" in the last 72 hours. ABG No results for input(s): "PHART", "HCO3" in the last 72 hours.  Invalid input(s): "PCO2", "PO2" PSA was 3.1 with Dr. Quillian Quince in 12/23.   UA is clear.  Studies/Results: NM PET (PSMA) SKULL TO MID THIGH  Result Date: 12/20/2022 CLINICAL DATA:  Prostate carcinoma with biochemical recurrence. Prostatectomy 2019, radiation 2020, PSA 5.0, Six-month prior 3.4 and 8 months prior 2.2. EXAM: NUCLEAR MEDICINE PET SKULL BASE TO THIGH TECHNIQUE: 9.11 mCi F18 Piflufolastat (Pylarify) was injected intravenously. Full-ring PET imaging was performed from the skull base to thigh after the radiotracer. CT data was  obtained and used for attenuation correction and anatomic localization. COMPARISON:  PET-CT June 18, 2022 FINDINGS: NECK No radiotracer activity in neck lymph nodes. Incidental CT finding: None. CHEST No radiotracer accumulation within mediastinal or hilar lymph nodes. No suspicious pulmonary nodules on the CT scan. Incidental CT finding: Paraseptal emphysema. Aortic atherosclerosis. Coronary artery calcifications. ABDOMEN/PELVIS Prostate: No focal activity in the prostate bed. Lymph nodes: New radiotracer avid infrarenal retrocaval 2 mm lymph node on image 139/3. Minimal increase in size of the radiotracer right common iliac lymph node now measuring 3 mm on image 110/3 previously 2 mm. Liver: No evidence of liver metastasis. Incidental CT finding: Large fluid density right renal cysts with dense mural calcifications. Aortic atherosclerosis. Ectasia of the infrarenal abdominal aorta measuring 2.7 cm not aneurysmal by size criteria SKELETON No focal activity to suggest skeletal metastasis. Left total hip arthroplasty. Multilevel degenerative change of the spine. Degenerative change of the left hip and bilateral shoulders IMPRESSION: 1. New radiotracer avid 2 mm infrarenal retrocaval lymph node with minimal increased size of the radiotracer avid right common iliac lymph node. 2. No abnormal radiotracer avidity in the prostatectomy bed 3. No evidence of radiotracer avid osseous metastatic disease. 4. Aortic Atherosclerosis (ICD10-I70.0) and Emphysema (ICD10-J43.9). Electronically Signed   By: Dahlia Bailiff M.D.   On: 12/20/2022 14:16    PSMA PET reviewed.  Notes from Dr. Tammi Klippel reviewed.     Assessment/Plan: Prostate  cancer with rising PSA s/p RALP and salvage radiation therapy.  He has a rising PSA with residual activity in the right common iliac node on PSMA PET and a new 30m retrocaval node.   He was given [place]240mg'$  today in preparation for SBRT.  He will start calcium and Vit D and return in a  month with labs for his next injection.   Meds ordered this encounter  Medications   degarelix (FIRMAGON) injection 240 mg     Orders Placed This Encounter  Procedures   PSA    Standing Status:   Future    Standing Expiration Date:   05/11/2023   Testosterone    Standing Status:   Future    Standing Expiration Date:   05/11/2023     Return in about 4 weeks (around 02/06/2023) for with PSA and testosterone for firmagon '80mg'$ .    CC: Dr. TGar Ponto      JIrine Seal3/06/2023 [phone]

## 2023-01-10 ENCOUNTER — Inpatient Hospital Stay: Payer: Medicare HMO | Admitting: Dietician

## 2023-01-10 ENCOUNTER — Encounter: Payer: Self-pay | Admitting: Urology

## 2023-01-13 NOTE — Progress Notes (Signed)
[facility] [address], [place] [address]   Clinic Day:  01/14/2023  Referring physician: Caryl Bis, MD  Patient Care Team: Caryl Bis, MD as PCP - General (Family Medicine) Harl Bowie Alphonse Guild, MD as PCP - Cardiology (Cardiology) Gala Romney Cristopher Estimable, MD as Consulting Physician (Gastroenterology) Derek Jack, MD as Medical Oncologist (Medical Oncology) Brien Mates, RN as Oncology Nurse Navigator (Medical Oncology)   ASSESSMENT & PLAN:   Assessment:  1.  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 Dorothyann Peng on 09/22/2018 - Final pathology: PT3b N0, Gleason 4+3 prostate adenocarcinoma, postoperative PSA remained detectable and rose to 5.7 - Axumin PET (06/2019): Local recurrence - Salvage XRT to the prostate fossa and pelvic lymph nodes, Dr. Charlotta Newton from 07/05/2019 - 08/31/2019, without concurrent ADT.  PSA responded well reaching a nadir of 0.1. - PSA rose again to 3.4 in 06/2022, PSMA PET scan: Oligometastatic disease involving single internal iliac lymph node. - 07/29/2022 - 08/09/2022: 10 fx course of UHRT to right internal iliac node, PSA only decreased slightly to 3.1 in 10/2022, further went up to 5 on 12/05/2022 - PSMA PET (12/19/2022): New radiotracer avid 2 mm infrarenal retrocaval lymph node and a minimal increase size of the previously treated right common iliac lymph node measuring 3 mm compared to 2 mm previously, no evidence of recurrence in the prostatectomy bed or bone mets. - Loading dose of Firmagon with Dr. Jeffie Pollock on 01/09/2023  2.  Social/family history: - He lives with wife at home.  He is independent of all ADLs and IADLs.  Uses cane for longer distances.  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  Current active smoker, 1 pack/day for 45 years. - Father had lung cancer and was smoker.  Plan:  1.  Metastatic CSPC  to abdominal/high pelvic lymph node (low-volume): - We have reviewed his diagnosis, scans and latest PSA levels. - We have discussed the role of Abiraterone/apalutamide/enzalutamide in the setting. - I have recommended to start on Abiraterone 500 mg daily for the first week, increase to 1000 mg daily if well-tolerated. - We discussed the side effects of Abiraterone in detail including but not limited to vasomotor symptoms, hypertension, hypokalemia, LFT elevation, fatigue among others. - He takes prednisone 5 mg daily intermittently for arthritic pains.  I have recommended that he continue prednisone 5 mg daily as long as he is on Abiraterone. - I have recommended germline mutation testing. - He will start XRT to the retrocaval lymph node and right common iliac lymph node 10 fractions on 01/16/2023 under the direction of Dr. Tammi Klippel. - I will see him back in 3 weeks for follow-up with CBC and CMP.  Orders Placed This Encounter  Procedures   CBC with Differential    Standing Status:   Future    Standing Expiration Date:   01/14/2024   Comprehensive metabolic panel    Standing Status:   Future    Standing Expiration Date:   01/14/2024   Magnesium    Standing Status:   Future    Standing Expiration Date:   01/14/2024   Ambulatory referral to Genetics    Referral Priority:   Routine    Referral Type:   Consultation    Referral Reason:   Specialty Services Required    Number of Visits Requested:   1  I,Alexis Herring,acting as a Education administrator for [employer], MD.,have documented all relevant documentation on the behalf of Derek Jack, MD,as directed by  Derek Jack, MD while in the presence of Derek Jack, MD.   I, Derek Jack MD, have reviewed the above documentation for accuracy and completeness, and I agree with the above.   Derek Jack, MD   3/12/202412:53 PM  CHIEF COMPLAINT/PURPOSE OF CONSULT:   Diagnosis: Prostate cancer metastatic  to intrapelvic lymph node   Cancer Staging  Malignant neoplasm of prostate metastatic to intra-abdominal lymph node [facility]) Staging form: Prostate, AJCC 8th Edition - Clinical stage from 01/14/2023: Stage IVB (cT3b, cN0, pM1a, PSA: 12.8, Grade Group: 4) - Unsigned    Prior Therapy: Metastatic castrate sensitive prostate cancer to the lymph nodes  Current Therapy: Mills Koller, XRT and Abiraterone   HISTORY OF PRESENT ILLNESS:   Oncology History   No history exists.      Dan [name] is a 77 y.o. male presenting to clinic today for evaluation of prostate cancer at the request of Dr. Jeffie Pollock.  Per review of Dr. Lavell Luster OV note from 01/09/23:  "Patient was treated with RALP with Dr. Dorothyann Peng in 2019 and was found to have T3b N0 M0 Gleason 7(4+3) prostate cancer on surgical path. He then had radiation therapy with Dr. Charlotta Newton for a PSA recurrence in 2020. He established care with Dr. Roni Bread on 03/28/22 for evaluation of hematuria and a cytology that showed cells suspicious for high grade malignancy. He is s/p prostatectomy.  He had a PET in 8/23 revealing an area of uptake in a right common iliac node- he had SBRT to the node in 10/23. Repeat PET 12/19/22 showed uptake in a 81m retrocaval node and residual activity in the treated ext iliac node. He has seen Dr. MTammi Klippeland is planning possible additional SBRT to the new node but he is reluctant to retreat the iliac node"  Today, he states that he is doing well overall. His appetite level is at 100%. His energy level is at 70%. He denies any new onset pains.  He denies any hot flashes since Firmagon injection.  He has met with Dr. MTammi Klippeland is moving forward with radiation to the both PET positive lymph nodes on 01/16/2023.   PAST MEDICAL HISTORY:   Past Medical History: Past Medical History:  Diagnosis Date   BPH (benign prostatic hyperplasia)    COPD (chronic obstructive pulmonary disease) (HCC)    Depression    ED (erectile dysfunction)    Gout     Hyperlipidemia    Hypertension    Loss of hearing    Nocturia    Paroxysmal atrial fibrillation (HCC)    Prostate cancer ([place]     Surgical History: Past Surgical History:  Procedure Laterality Date   CARDIOVASCULAR STRESS TEST  12/31/2004   normal perfuction nuclear study w/ no evidence ischemia /  normal LV function and wall motion , ef 52%   PROSTATECTOMY  2019   TOTAL HIP ARTHROPLASTY      Social History: Social History   Socioeconomic History   Marital status: Married    Spouse name: Not on file   Number of children: Not on file   Years of education: Not on file   Highest education level: Not on file  Occupational History   Not on file  Tobacco Use   Smoking status: Every Day    Types: Cigarettes    Start date: 06/12/1967   Smokeless tobacco: Never  Vaping Use  Vaping Use: Never used  Substance and Sexual Activity   Alcohol use: Not Currently   Drug use: Not Currently   Sexual activity: Not on file  Other Topics Concern   Not on file  Social History Narrative   Not on file   Social Determinants of Health   Financial Resource Strain: Not on file  Food Insecurity: No Food Insecurity (01/14/2023)   Hunger Vital Sign    Worried About Running Out of Food in the Last Year: Never true    Ran Out of Food in the Last Year: Never true  Transportation Needs: No Transportation Needs (01/14/2023)   PRAPARE - Hydrologist (Medical): No    Lack of Transportation (Non-Medical): No  Physical Activity: Not on file  Stress: Not on file  Social Connections: Not on file  Intimate Partner Violence: Not At Risk (01/14/2023)   Humiliation, Afraid, Rape, and Kick questionnaire    Fear of Current or Ex-Partner: No    Emotionally Abused: No    Physically Abused: No    Sexually Abused: No    Family History: Family History  Problem Relation Age of Onset   Hypertension Mother    Cancer Father        lung   Colon cancer Neg Hx    Inflammatory  bowel disease Neg Hx    Pancreatic cancer Neg Hx     Current Medications:  Current Outpatient Medications:    abiraterone acetate (ZYTIGA) 250 MG tablet, Take 4 tablets (1,000 mg total) by mouth daily. Take on an empty stomach 1 hour before or 2 hours after a meal, Disp: 120 tablet, Rfl: 3   albuterol (PROVENTIL HFA;VENTOLIN HFA) 108 (90 Base) MCG/ACT inhaler, Inhale 2 puffs into the lungs every 6 (six) hours as needed for wheezing or shortness of breath., Disp: , Rfl:    albuterol (PROVENTIL) (2.5 MG/3ML) 0.083% nebulizer solution, Take 2.5 mg by nebulization every 6 (six) hours as needed., Disp: , Rfl:    allopurinol (ZYLOPRIM) 100 MG tablet, Take 100 mg by mouth daily., Disp: , Rfl:    Ascorbic Acid (VITAMIN C) 1000 MG tablet, Take 1,000 mg by mouth daily., Disp: , Rfl:    aspirin EC 81 MG tablet, Take 81 mg by mouth daily. Swallow whole., Disp: , Rfl:    cholecalciferol (VITAMIN D3) 25 MCG (1000 UNIT) tablet, Take 1,000 Units by mouth daily., Disp: , Rfl:    colchicine 0.6 MG tablet, Take 1 tablet by mouth daily as needed., Disp: , Rfl:    diltiazem (CARDIZEM CD) 360 MG 24 hr capsule, Take by mouth daily., Disp: , Rfl:    losartan (COZAAR) 25 MG tablet, TAKE 1 TABLET (25 MG TOTAL) BY MOUTH DAILY., Disp: 90 tablet, Rfl: 1   megestrol (MEGACE) 40 MG/ML suspension, Take by mouth., Disp: , Rfl:    potassium chloride (MICRO-K) 10 MEQ CR capsule, TAKE 2 CAPSULES BY MOUTH DAILY., Disp: 7 capsule, Rfl: 0   predniSONE (DELTASONE) 5 MG tablet, Take 10 mg by mouth daily., Disp: , Rfl:    predniSONE (DELTASONE) 5 MG tablet, Take 1 tablet (5 mg total) by mouth daily with breakfast., Disp: 90 tablet, Rfl: 1   TRELEGY ELLIPTA 200-62.5-25 MCG/ACT AEPB, Inhale 1 puff into the lungs daily., Disp: , Rfl:    Zinc 50 MG TABS, Take 1 tablet by mouth daily., Disp: , Rfl:    Allergies: Allergies  Allergen Reactions   Tamsulosin Hives and Itching  REVIEW OF SYSTEMS:   Review of Systems  Constitutional:   Negative for chills, fatigue and fever.  HENT:   Negative for lump/mass, mouth sores, nosebleeds, sore throat and trouble swallowing.   Eyes:  Negative for eye problems.  Respiratory:  Negative for cough and shortness of breath.   Cardiovascular:  Negative for chest pain, leg swelling and palpitations.  Gastrointestinal:  Negative for abdominal pain, constipation, diarrhea, nausea and vomiting.  Genitourinary:  Positive for bladder incontinence. Negative for difficulty urinating, dysuria, frequency, hematuria and nocturia.   Musculoskeletal:  Negative for arthralgias, back pain, flank pain, myalgias and neck pain.  Skin:  Negative for itching and rash.  Neurological:  Negative for dizziness, headaches and numbness.  Hematological:  Does not bruise/bleed easily.  Psychiatric/Behavioral:  Positive for sleep disturbance. Negative for depression and suicidal ideas. The patient is not nervous/anxious.   All other systems reviewed and are negative.    VITALS:   Blood pressure (!) 165/71, pulse 81, temperature 97.6 F (36.4 C), temperature source Tympanic, resp. rate 18, height '5\' 10"'$  (1.778 m), weight 147 lb 4.8 oz (66.8 kg), SpO2 100 %.  Wt Readings from Last 3 Encounters:  01/14/23 147 lb 4.8 oz (66.8 kg)  01/09/23 149 lb (67.6 kg)  01/02/23 149 lb (67.6 kg)    Body mass index is 21.14 kg/m.  Performance status (ECOG): 1 - Symptomatic but completely ambulatory  PHYSICAL EXAM:   Physical Exam Vitals and nursing note reviewed. Exam conducted with a chaperone present.  Constitutional:      Appearance: Normal appearance.  Cardiovascular:     Rate and Rhythm: Normal rate and regular rhythm.     Pulses: Normal pulses.     Heart sounds: Normal heart sounds.  Pulmonary:     Effort: Pulmonary effort is normal.     Breath sounds: Normal breath sounds.  Abdominal:     Palpations: Abdomen is soft. There is no hepatomegaly, splenomegaly or mass.     Tenderness: There is no abdominal  tenderness.  Musculoskeletal:     Right lower leg: No edema.     Left lower leg: No edema.  Lymphadenopathy:     Cervical: No cervical adenopathy.     Right cervical: No superficial, deep or posterior cervical adenopathy.    Left cervical: No superficial, deep or posterior cervical adenopathy.     Upper Body:     Right upper body: No supraclavicular or axillary adenopathy.     Left upper body: No supraclavicular or axillary adenopathy.  Neurological:     General: No focal deficit present.     Mental Status: He is alert and oriented to person, place, and time.  Psychiatric:        Mood and Affect: Mood normal.        Behavior: Behavior normal.     LABS:       No data to display            Latest Ref Rng & Units 03/28/2022   11:31 AM 01/24/2020   10:26 AM  CMP  Glucose 70 - 99 mg/dL 104  112   BUN 8 - 27 mg/dL 16  24   Creatinine 0.76 - 1.27 mg/dL 0.93  1.11   Sodium 134 - 144 mmol/L 139  135   Potassium 3.5 - 5.2 mmol/L 4.8  3.2   Chloride 96 - 106 mmol/L 101  94   CO2 20 - 29 mmol/L 19  28   Calcium 8.6 -  10.2 mg/dL 10.2  9.3      No results found for: "CEA1", "CEA" / No results found for: "CEA1", "CEA" Lab Results  Component Value Date   PSA1 6.9 (H) 01/02/2023   No results found for: "[id number]" No results found for: "CAN125"  No results found for: "TOTALPROTELP", "ALBUMINELP", "A1GS", "A2GS", "BETS", "BETA2SER", "GAMS", "MSPIKE", "SPEI" No results found for: "TIBC", "FERRITIN", "IRONPCTSAT" No results found for: "LDH"   STUDIES:   NM PET (PSMA) SKULL TO MID THIGH  Result Date: 12/20/2022 CLINICAL DATA:  Prostate carcinoma with biochemical recurrence. Prostatectomy 2019, radiation 2020, PSA 5.0, Six-month prior 3.4 and 8 months prior 2.2. EXAM: NUCLEAR MEDICINE PET SKULL BASE TO THIGH TECHNIQUE: 9.11 mCi F18 Piflufolastat (Pylarify) was injected intravenously. Full-ring PET imaging was performed from the skull base to thigh after the radiotracer. CT data was  obtained and used for attenuation correction and anatomic localization. COMPARISON:  PET-CT June 18, 2022 FINDINGS: NECK No radiotracer activity in neck lymph nodes. Incidental CT finding: None. CHEST No radiotracer accumulation within mediastinal or hilar lymph nodes. No suspicious pulmonary nodules on the CT scan. Incidental CT finding: Paraseptal emphysema. Aortic atherosclerosis. Coronary artery calcifications. ABDOMEN/PELVIS Prostate: No focal activity in the prostate bed. Lymph nodes: New radiotracer avid infrarenal retrocaval 2 mm lymph node on image 139/3. Minimal increase in size of the radiotracer right common iliac lymph node now measuring 3 mm on image 110/3 previously 2 mm. Liver: No evidence of liver metastasis. Incidental CT finding: Large fluid density right renal cysts with dense mural calcifications. Aortic atherosclerosis. Ectasia of the infrarenal abdominal aorta measuring 2.7 cm not aneurysmal by size criteria SKELETON No focal activity to suggest skeletal metastasis. Left total hip arthroplasty. Multilevel degenerative change of the spine. Degenerative change of the left hip and bilateral shoulders IMPRESSION: 1. New radiotracer avid 2 mm infrarenal retrocaval lymph node with minimal increased size of the radiotracer avid right common iliac lymph node. 2. No abnormal radiotracer avidity in the prostatectomy bed 3. No evidence of radiotracer avid osseous metastatic disease. 4. Aortic Atherosclerosis (ICD10-I70.0) and Emphysema (ICD10-J43.9). Electronically Signed   By: Dahlia Bailiff M.D.   On: 12/20/2022 14:16

## 2023-01-14 ENCOUNTER — Telehealth: Payer: Self-pay | Admitting: Pharmacist

## 2023-01-14 ENCOUNTER — Telehealth: Payer: Self-pay

## 2023-01-14 ENCOUNTER — Inpatient Hospital Stay: Payer: Medicare HMO | Attending: Hematology | Admitting: Hematology

## 2023-01-14 ENCOUNTER — Other Ambulatory Visit (HOSPITAL_COMMUNITY): Payer: Self-pay

## 2023-01-14 ENCOUNTER — Other Ambulatory Visit: Payer: Self-pay

## 2023-01-14 VITALS — BP 165/71 | HR 81 | Temp 97.6°F | Resp 18 | Ht 70.0 in | Wt 147.3 lb

## 2023-01-14 DIAGNOSIS — C778 Secondary and unspecified malignant neoplasm of lymph nodes of multiple regions: Secondary | ICD-10-CM | POA: Diagnosis not present

## 2023-01-14 DIAGNOSIS — Z923 Personal history of irradiation: Secondary | ICD-10-CM | POA: Insufficient documentation

## 2023-01-14 DIAGNOSIS — Z7952 Long term (current) use of systemic steroids: Secondary | ICD-10-CM | POA: Insufficient documentation

## 2023-01-14 DIAGNOSIS — Z79899 Other long term (current) drug therapy: Secondary | ICD-10-CM | POA: Insufficient documentation

## 2023-01-14 DIAGNOSIS — Z9079 Acquired absence of other genital organ(s): Secondary | ICD-10-CM | POA: Diagnosis not present

## 2023-01-14 DIAGNOSIS — Z801 Family history of malignant neoplasm of trachea, bronchus and lung: Secondary | ICD-10-CM | POA: Insufficient documentation

## 2023-01-14 DIAGNOSIS — C61 Malignant neoplasm of prostate: Secondary | ICD-10-CM | POA: Insufficient documentation

## 2023-01-14 DIAGNOSIS — F17210 Nicotine dependence, cigarettes, uncomplicated: Secondary | ICD-10-CM | POA: Insufficient documentation

## 2023-01-14 DIAGNOSIS — R69 Illness, unspecified: Secondary | ICD-10-CM | POA: Diagnosis not present

## 2023-01-14 MED ORDER — PREDNISONE 5 MG PO TABS: 5.0000 mg | ORAL_TABLET | Freq: Every day | ORAL | 1 refills | Status: DC

## 2023-01-14 MED ORDER — ABIRATERONE ACETATE 250 MG PO TABS
1000.0000 mg | ORAL_TABLET | Freq: Every day | ORAL | 3 refills | Status: DC
  Filled 2023-01-14 (×2): qty 120, 30d supply, fill #0
  Filled 2023-02-10: qty 120, 30d supply, fill #1
  Filled 2023-03-07: qty 120, 30d supply, fill #2
  Filled 2023-04-15: qty 120, 30d supply, fill #3

## 2023-01-14 NOTE — Telephone Encounter (Signed)
Patient successfully OnBoarded. Zytiga set to be shipped to patient from [facility] on 03/13 for delivery on 03/14.    Dan [name], [facility] Patient [place]  [phone] (phone) [phone] (fax) 01/14/2023 11:06 AM

## 2023-01-14 NOTE — Patient Instructions (Addendum)
[facility]  Discharge Instructions  You were seen and examined today by Dr. Delton Coombes. Dr. Delton Coombes is a medical oncologist, meaning that he specializes in the treatment of cancer diagnoses. Dr. Delton Coombes discussed your past medical history, family history of cancers, and the events that led to you being here today.  You were referred to Dr. Delton Coombes for ongoing management of your Stage IV Prostate Cancer.  Your most recent PET scan, it revealed a new lymph node involved with cancer. Your PSA is also rising.  Dr. Delton Coombes has recommended continuing the hormone shots Mills Koller). Dr. Delton Coombes discussed adding a new medication to help control your prostate cancer. This would be done with pills.  Dr. Delton Coombes has recommended starting Zytiga. This is taken once daily on an empty stomach. Please start with a half dose ('500mg'$ ) for 7 days, and then increase to the full dose prescribed on your bottle ('1000mg'$ ).  Prednisone should be taken daily as long as you are on Zytiga.  Dr. Delton Coombes has also referred you for genetic counseling to see if there is any predisposition to cancer.  Follow-up as scheduled.  Thank you for choosing [facility] to provide your oncology and hematology care.   To afford each patient quality time with our provider, please arrive at least 15 minutes before your scheduled appointment time. You may need to reschedule your appointment if you arrive late (10 or more minutes). Arriving late affects you and other patients whose appointments are after yours.  Also, if you miss three or more appointments without notifying the office, you may be dismissed from the clinic at the provider's discretion.    Again, thank you for choosing [facility].  Our hope is that these requests will decrease the amount of time that you wait before being seen by our physicians.   If you have a lab appointment with the  [facility] please come in thru the Main Entrance and check in at the main information desk.           _____________________________________________________________  Should you have questions after your visit to [facility], please contact our office at [phone] and follow the prompts.  Our office hours are 8:00 a.m. to 4:30 p.m. Monday - Thursday and 8:00 a.m. to 2:30 p.m. Friday.  Please note that voicemails left after 4:00 p.m. may not be returned until the following business day.  We are closed weekends and all major holidays.  You do have access to a nurse 24-7, just call the main number to the clinic [phone] and do not press any options, hold on the line and a nurse will answer the phone.    For prescription refill requests, have your pharmacy contact our office and allow 72 hours.    Masks are optional in the cancer centers. If you would like for your care team to wear a mask while they are taking care of you, please let them know. You may have one support person who is at least 76 years old accompany you for your appointments.

## 2023-01-14 NOTE — Telephone Encounter (Signed)
Oral Oncology Pharmacist Encounter  Received new prescription for Zytiga (abiraterone) for the treatment of prostate cancer in conjunction with prednisone and ADT, planned duration until disease progression or unacceptable drug toxicity.  No recent CMP for liver function evaluation, no chart documentation of any history of hepatic impairment. Provider enter order for CMP to be check at next lab appt. Prescription dose and frequency assessed.   Per nursing check out note from office visit 01/14/23, MD would like for patient to start at '500mg'$  daily for 7 days then increase to '1000mg'$  daily.   Current medication list in Epic reviewed, no DDIs with abiraterone identified.  Evaluated chart and no patient barriers to medication adherence identified.   Prescription has been e-scribed to the [facility] for benefits analysis and approval.  Oral Oncology Clinic will continue to follow for insurance authorization, copayment issues, initial counseling and start date.   Darl Pikes, PharmD, BCPS, BCOP, CPP Hematology/Oncology Clinical Pharmacist Practitioner [place]/DB/AP Oral [facility] [phone]  01/14/2023 9:34 AM

## 2023-01-14 NOTE — Telephone Encounter (Signed)
Oral Chemotherapy Pharmacist Encounter  [facility] (Specialty) will deliver Zytiga to patient on 01/16/23.  Patient Education I spoke with patient's wife Barnett Applebaum for overview of new oral chemotherapy medication: Zytiga (abiraterone) for the treatment of prostate cancer in conjunction with prednisone and ADT, planned duration until disease progression or unacceptable drug toxicity.   Counseled Gina on administration, dosing, side effects, monitoring, drug-food interactions, safe handling, storage, and disposal. Patient will take: Abiraterone: Take 2 tablets ('500mg'$ ) daily for 7 days, then take 4 tablets (1,000 mg total) by mouth daily. Take on an empty stomach 1 hour before or 2 hours after a meal  Prednisone: Take 1 tablet (5 mg total) by mouth daily with breakfast.   Side effects include but not limited to: hypertension, fatigue, decreased wbc. Hypertension: Patient had a high blood pressure reading at his office visit today. Per his wife his blood pressure is normal at home. They have a blood pressure cuff at home so I asked that they check and record his BP at home and bring the readings to his next office visit. Reviewed the s/sx of hypertension.    Reviewed with Barnett Applebaum importance of keeping a medication schedule and plan for any missed doses.  After discussion with Barnett Applebaum no patient barriers to medication adherence identified.   Barnett Applebaum  voiced understanding and appreciation. All questions answered. Medication handout provided.  Provided Gina with Oral Chemotherapy Navigation Clinic phone number. Barnett Applebaum knows to call the office with questions or concerns. Oral Chemotherapy Navigation Clinic will continue to follow.  Darl Pikes, PharmD, BCPS, BCOP, CPP Hematology/Oncology Clinical Pharmacist Practitioner [place]/DB/AP Oral [facility] [phone]  01/14/2023 11:51 AM

## 2023-01-14 NOTE — Telephone Encounter (Signed)
Oral Oncology Patient Advocate Encounter  After completing a benefits investigation, prior authorization for Abiraterone is not required at this time through [place].  Patient's copay is $4.50.     Berdine Addison, [facility] Patient [place]  [phone] (phone) [phone] (fax) 01/14/2023 9:10 AM

## 2023-01-15 ENCOUNTER — Other Ambulatory Visit (HOSPITAL_COMMUNITY): Payer: Self-pay

## 2023-01-15 DIAGNOSIS — C778 Secondary and unspecified malignant neoplasm of lymph nodes of multiple regions: Secondary | ICD-10-CM | POA: Diagnosis not present

## 2023-01-15 DIAGNOSIS — R9721 Rising PSA following treatment for malignant neoplasm of prostate: Secondary | ICD-10-CM | POA: Diagnosis not present

## 2023-01-15 DIAGNOSIS — Z510 Encounter for antineoplastic radiation therapy: Secondary | ICD-10-CM | POA: Diagnosis not present

## 2023-01-15 DIAGNOSIS — C775 Secondary and unspecified malignant neoplasm of intrapelvic lymph nodes: Secondary | ICD-10-CM | POA: Diagnosis not present

## 2023-01-15 DIAGNOSIS — C61 Malignant neoplasm of prostate: Secondary | ICD-10-CM | POA: Diagnosis not present

## 2023-01-16 ENCOUNTER — Other Ambulatory Visit: Payer: Self-pay

## 2023-01-16 ENCOUNTER — Ambulatory Visit
Admission: RE | Admit: 2023-01-16 | Discharge: 2023-01-16 | Disposition: A | Discharge status: Home / Self Care | Payer: Medicare HMO | Source: Ambulatory Visit | Attending: Radiation Oncology | Admitting: Radiation Oncology

## 2023-01-16 DIAGNOSIS — C778 Secondary and unspecified malignant neoplasm of lymph nodes of multiple regions: Secondary | ICD-10-CM | POA: Diagnosis not present

## 2023-01-16 DIAGNOSIS — C775 Secondary and unspecified malignant neoplasm of intrapelvic lymph nodes: Secondary | ICD-10-CM | POA: Diagnosis not present

## 2023-01-16 DIAGNOSIS — C61 Malignant neoplasm of prostate: Secondary | ICD-10-CM | POA: Diagnosis not present

## 2023-01-16 DIAGNOSIS — R9721 Rising PSA following treatment for malignant neoplasm of prostate: Secondary | ICD-10-CM | POA: Diagnosis not present

## 2023-01-16 DIAGNOSIS — Z510 Encounter for antineoplastic radiation therapy: Secondary | ICD-10-CM | POA: Diagnosis not present

## 2023-01-16 LAB — RAD ONC ARIA SESSION SUMMARY
Course Elapsed Days: 0
Plan Fractions Treated to Date: 1
Plan Prescribed Dose Per Fraction: 5 Gy
Plan Total Fractions Prescribed: 10
Plan Total Prescribed Dose: 50 Gy
Reference Point Dosage Given to Date: 5 Gy
Reference Point Session Dosage Given: 5 Gy
Session Number: 1

## 2023-01-17 ENCOUNTER — Ambulatory Visit
Admission: RE | Admit: 2023-01-17 | Discharge: 2023-01-17 | Disposition: A | Discharge status: Home / Self Care | Payer: Medicare HMO | Source: Ambulatory Visit | Attending: Radiation Oncology

## 2023-01-17 ENCOUNTER — Ambulatory Visit
Admission: RE | Admit: 2023-01-17 | Discharge: 2023-01-17 | Disposition: A | Discharge status: Home / Self Care | Payer: Medicare HMO | Source: Ambulatory Visit | Attending: Radiation Oncology | Admitting: Radiation Oncology

## 2023-01-17 ENCOUNTER — Other Ambulatory Visit: Payer: Self-pay

## 2023-01-17 DIAGNOSIS — Z510 Encounter for antineoplastic radiation therapy: Secondary | ICD-10-CM | POA: Diagnosis not present

## 2023-01-17 DIAGNOSIS — R9721 Rising PSA following treatment for malignant neoplasm of prostate: Secondary | ICD-10-CM | POA: Diagnosis not present

## 2023-01-17 DIAGNOSIS — C775 Secondary and unspecified malignant neoplasm of intrapelvic lymph nodes: Secondary | ICD-10-CM | POA: Diagnosis not present

## 2023-01-17 DIAGNOSIS — C61 Malignant neoplasm of prostate: Secondary | ICD-10-CM | POA: Diagnosis not present

## 2023-01-17 DIAGNOSIS — C778 Secondary and unspecified malignant neoplasm of lymph nodes of multiple regions: Secondary | ICD-10-CM | POA: Diagnosis not present

## 2023-01-17 LAB — RAD ONC ARIA SESSION SUMMARY
Course Elapsed Days: 1
Plan Fractions Treated to Date: 2
Plan Prescribed Dose Per Fraction: 5 Gy
Plan Total Fractions Prescribed: 10
Plan Total Prescribed Dose: 50 Gy
Reference Point Dosage Given to Date: 10 Gy
Reference Point Session Dosage Given: 5 Gy
Session Number: 2

## 2023-01-20 ENCOUNTER — Ambulatory Visit
Admission: RE | Admit: 2023-01-20 | Discharge: 2023-01-20 | Disposition: A | Discharge status: Home / Self Care | Payer: Medicare HMO | Source: Ambulatory Visit | Attending: Radiation Oncology

## 2023-01-20 ENCOUNTER — Other Ambulatory Visit: Payer: Self-pay

## 2023-01-20 DIAGNOSIS — C61 Malignant neoplasm of prostate: Secondary | ICD-10-CM | POA: Diagnosis not present

## 2023-01-20 DIAGNOSIS — R9721 Rising PSA following treatment for malignant neoplasm of prostate: Secondary | ICD-10-CM | POA: Diagnosis not present

## 2023-01-20 DIAGNOSIS — Z510 Encounter for antineoplastic radiation therapy: Secondary | ICD-10-CM | POA: Diagnosis not present

## 2023-01-20 DIAGNOSIS — C775 Secondary and unspecified malignant neoplasm of intrapelvic lymph nodes: Secondary | ICD-10-CM | POA: Diagnosis not present

## 2023-01-20 DIAGNOSIS — C778 Secondary and unspecified malignant neoplasm of lymph nodes of multiple regions: Secondary | ICD-10-CM | POA: Diagnosis not present

## 2023-01-20 LAB — RAD ONC ARIA SESSION SUMMARY
Course Elapsed Days: 4
Plan Fractions Treated to Date: 3
Plan Prescribed Dose Per Fraction: 5 Gy
Plan Total Fractions Prescribed: 10
Plan Total Prescribed Dose: 50 Gy
Reference Point Dosage Given to Date: 15 Gy
Reference Point Session Dosage Given: 5 Gy
Session Number: 3

## 2023-01-21 ENCOUNTER — Other Ambulatory Visit: Payer: Self-pay

## 2023-01-21 ENCOUNTER — Ambulatory Visit
Admission: RE | Admit: 2023-01-21 | Discharge: 2023-01-21 | Disposition: A | Discharge status: Home / Self Care | Payer: Medicare HMO | Source: Ambulatory Visit | Attending: Radiation Oncology

## 2023-01-21 DIAGNOSIS — Z510 Encounter for antineoplastic radiation therapy: Secondary | ICD-10-CM | POA: Diagnosis not present

## 2023-01-21 DIAGNOSIS — C775 Secondary and unspecified malignant neoplasm of intrapelvic lymph nodes: Secondary | ICD-10-CM | POA: Diagnosis not present

## 2023-01-21 DIAGNOSIS — C778 Secondary and unspecified malignant neoplasm of lymph nodes of multiple regions: Secondary | ICD-10-CM | POA: Diagnosis not present

## 2023-01-21 DIAGNOSIS — R9721 Rising PSA following treatment for malignant neoplasm of prostate: Secondary | ICD-10-CM | POA: Diagnosis not present

## 2023-01-21 DIAGNOSIS — C61 Malignant neoplasm of prostate: Secondary | ICD-10-CM | POA: Diagnosis not present

## 2023-01-21 LAB — RAD ONC ARIA SESSION SUMMARY
Course Elapsed Days: 5
Plan Fractions Treated to Date: 4
Plan Prescribed Dose Per Fraction: 5 Gy
Plan Total Fractions Prescribed: 10
Plan Total Prescribed Dose: 50 Gy
Reference Point Dosage Given to Date: 20 Gy
Reference Point Session Dosage Given: 5 Gy
Session Number: 4

## 2023-01-22 ENCOUNTER — Ambulatory Visit
Admission: RE | Admit: 2023-01-22 | Discharge: 2023-01-22 | Disposition: A | Discharge status: Home / Self Care | Payer: Medicare HMO | Source: Ambulatory Visit | Attending: Radiation Oncology | Admitting: Radiation Oncology

## 2023-01-22 ENCOUNTER — Other Ambulatory Visit: Payer: Self-pay

## 2023-01-22 DIAGNOSIS — C775 Secondary and unspecified malignant neoplasm of intrapelvic lymph nodes: Secondary | ICD-10-CM | POA: Diagnosis not present

## 2023-01-22 DIAGNOSIS — C61 Malignant neoplasm of prostate: Secondary | ICD-10-CM | POA: Diagnosis not present

## 2023-01-22 DIAGNOSIS — C778 Secondary and unspecified malignant neoplasm of lymph nodes of multiple regions: Secondary | ICD-10-CM | POA: Diagnosis not present

## 2023-01-22 DIAGNOSIS — R9721 Rising PSA following treatment for malignant neoplasm of prostate: Secondary | ICD-10-CM | POA: Diagnosis not present

## 2023-01-22 DIAGNOSIS — Z510 Encounter for antineoplastic radiation therapy: Secondary | ICD-10-CM | POA: Diagnosis not present

## 2023-01-22 LAB — RAD ONC ARIA SESSION SUMMARY
Course Elapsed Days: 6
Plan Fractions Treated to Date: 5
Plan Prescribed Dose Per Fraction: 5 Gy
Plan Total Fractions Prescribed: 10
Plan Total Prescribed Dose: 50 Gy
Reference Point Dosage Given to Date: 25 Gy
Reference Point Session Dosage Given: 5 Gy
Session Number: 5

## 2023-01-23 ENCOUNTER — Ambulatory Visit
Admission: RE | Admit: 2023-01-23 | Discharge: 2023-01-23 | Disposition: A | Discharge status: Home / Self Care | Payer: Medicare HMO | Source: Ambulatory Visit | Attending: Radiation Oncology | Admitting: Radiation Oncology

## 2023-01-23 ENCOUNTER — Other Ambulatory Visit: Payer: Self-pay

## 2023-01-23 DIAGNOSIS — R9721 Rising PSA following treatment for malignant neoplasm of prostate: Secondary | ICD-10-CM | POA: Diagnosis not present

## 2023-01-23 DIAGNOSIS — Z510 Encounter for antineoplastic radiation therapy: Secondary | ICD-10-CM | POA: Diagnosis not present

## 2023-01-23 DIAGNOSIS — C61 Malignant neoplasm of prostate: Secondary | ICD-10-CM | POA: Diagnosis not present

## 2023-01-23 DIAGNOSIS — C778 Secondary and unspecified malignant neoplasm of lymph nodes of multiple regions: Secondary | ICD-10-CM | POA: Diagnosis not present

## 2023-01-23 DIAGNOSIS — C775 Secondary and unspecified malignant neoplasm of intrapelvic lymph nodes: Secondary | ICD-10-CM | POA: Diagnosis not present

## 2023-01-23 LAB — RAD ONC ARIA SESSION SUMMARY
Course Elapsed Days: 7
Plan Fractions Treated to Date: 6
Plan Prescribed Dose Per Fraction: 5 Gy
Plan Total Fractions Prescribed: 10
Plan Total Prescribed Dose: 50 Gy
Reference Point Dosage Given to Date: 30 Gy
Reference Point Session Dosage Given: 5 Gy
Session Number: 6

## 2023-01-24 ENCOUNTER — Ambulatory Visit: Admission: RE | Admit: 2023-01-24 | Payer: Medicare HMO | Source: Ambulatory Visit

## 2023-01-24 ENCOUNTER — Other Ambulatory Visit: Payer: Self-pay

## 2023-01-24 ENCOUNTER — Ambulatory Visit
Admission: RE | Admit: 2023-01-24 | Discharge: 2023-01-24 | Disposition: A | Discharge status: Home / Self Care | Payer: Medicare HMO | Source: Ambulatory Visit | Attending: Radiation Oncology | Admitting: Radiation Oncology

## 2023-01-24 DIAGNOSIS — R9721 Rising PSA following treatment for malignant neoplasm of prostate: Secondary | ICD-10-CM | POA: Diagnosis not present

## 2023-01-24 DIAGNOSIS — C61 Malignant neoplasm of prostate: Secondary | ICD-10-CM | POA: Diagnosis not present

## 2023-01-24 DIAGNOSIS — C775 Secondary and unspecified malignant neoplasm of intrapelvic lymph nodes: Secondary | ICD-10-CM | POA: Diagnosis not present

## 2023-01-24 DIAGNOSIS — C778 Secondary and unspecified malignant neoplasm of lymph nodes of multiple regions: Secondary | ICD-10-CM | POA: Diagnosis not present

## 2023-01-24 DIAGNOSIS — Z510 Encounter for antineoplastic radiation therapy: Secondary | ICD-10-CM | POA: Diagnosis not present

## 2023-01-24 LAB — RAD ONC ARIA SESSION SUMMARY
Course Elapsed Days: 8
Plan Fractions Treated to Date: 7
Plan Prescribed Dose Per Fraction: 5 Gy
Plan Total Fractions Prescribed: 10
Plan Total Prescribed Dose: 50 Gy
Reference Point Dosage Given to Date: 35 Gy
Reference Point Session Dosage Given: 5 Gy
Session Number: 7

## 2023-01-27 ENCOUNTER — Ambulatory Visit
Admission: RE | Admit: 2023-01-27 | Discharge: 2023-01-27 | Disposition: A | Discharge status: Home / Self Care | Payer: Medicare HMO | Source: Ambulatory Visit | Attending: Radiation Oncology | Admitting: Radiation Oncology

## 2023-01-27 ENCOUNTER — Ambulatory Visit: Payer: Medicare HMO

## 2023-01-27 ENCOUNTER — Other Ambulatory Visit: Payer: Self-pay

## 2023-01-27 DIAGNOSIS — R9721 Rising PSA following treatment for malignant neoplasm of prostate: Secondary | ICD-10-CM | POA: Diagnosis not present

## 2023-01-27 DIAGNOSIS — C775 Secondary and unspecified malignant neoplasm of intrapelvic lymph nodes: Secondary | ICD-10-CM | POA: Diagnosis not present

## 2023-01-27 DIAGNOSIS — C778 Secondary and unspecified malignant neoplasm of lymph nodes of multiple regions: Secondary | ICD-10-CM | POA: Diagnosis not present

## 2023-01-27 DIAGNOSIS — Z510 Encounter for antineoplastic radiation therapy: Secondary | ICD-10-CM | POA: Diagnosis not present

## 2023-01-27 DIAGNOSIS — C61 Malignant neoplasm of prostate: Secondary | ICD-10-CM | POA: Diagnosis not present

## 2023-01-27 LAB — RAD ONC ARIA SESSION SUMMARY
Course Elapsed Days: 11
Plan Fractions Treated to Date: 8
Plan Prescribed Dose Per Fraction: 5 Gy
Plan Total Fractions Prescribed: 10
Plan Total Prescribed Dose: 50 Gy
Reference Point Dosage Given to Date: 40 Gy
Reference Point Session Dosage Given: 5 Gy
Session Number: 8

## 2023-01-28 ENCOUNTER — Other Ambulatory Visit: Payer: Self-pay

## 2023-01-28 ENCOUNTER — Ambulatory Visit
Admission: RE | Admit: 2023-01-28 | Discharge: 2023-01-28 | Disposition: A | Discharge status: Home / Self Care | Payer: Medicare HMO | Source: Ambulatory Visit | Attending: Radiation Oncology

## 2023-01-28 DIAGNOSIS — C775 Secondary and unspecified malignant neoplasm of intrapelvic lymph nodes: Secondary | ICD-10-CM | POA: Diagnosis not present

## 2023-01-28 DIAGNOSIS — R9721 Rising PSA following treatment for malignant neoplasm of prostate: Secondary | ICD-10-CM | POA: Diagnosis not present

## 2023-01-28 DIAGNOSIS — C778 Secondary and unspecified malignant neoplasm of lymph nodes of multiple regions: Secondary | ICD-10-CM | POA: Diagnosis not present

## 2023-01-28 DIAGNOSIS — C61 Malignant neoplasm of prostate: Secondary | ICD-10-CM | POA: Diagnosis not present

## 2023-01-28 DIAGNOSIS — Z510 Encounter for antineoplastic radiation therapy: Secondary | ICD-10-CM | POA: Diagnosis not present

## 2023-01-28 LAB — RAD ONC ARIA SESSION SUMMARY
Course Elapsed Days: 12
Plan Fractions Treated to Date: 9
Plan Prescribed Dose Per Fraction: 5 Gy
Plan Total Fractions Prescribed: 10
Plan Total Prescribed Dose: 50 Gy
Reference Point Dosage Given to Date: 45 Gy
Reference Point Session Dosage Given: 5 Gy
Session Number: 9

## 2023-01-29 ENCOUNTER — Ambulatory Visit: Admission: RE | Admit: 2023-01-29 | Payer: Medicare HMO | Source: Ambulatory Visit

## 2023-01-29 ENCOUNTER — Other Ambulatory Visit: Payer: Self-pay

## 2023-01-29 ENCOUNTER — Ambulatory Visit
Admission: RE | Admit: 2023-01-29 | Discharge: 2023-01-29 | Disposition: A | Discharge status: Home / Self Care | Payer: Medicare HMO | Source: Ambulatory Visit | Attending: Radiation Oncology

## 2023-01-29 ENCOUNTER — Encounter: Payer: Self-pay | Admitting: Urology

## 2023-01-29 DIAGNOSIS — R9721 Rising PSA following treatment for malignant neoplasm of prostate: Secondary | ICD-10-CM | POA: Diagnosis not present

## 2023-01-29 DIAGNOSIS — Z510 Encounter for antineoplastic radiation therapy: Secondary | ICD-10-CM | POA: Diagnosis not present

## 2023-01-29 DIAGNOSIS — C778 Secondary and unspecified malignant neoplasm of lymph nodes of multiple regions: Secondary | ICD-10-CM | POA: Diagnosis not present

## 2023-01-29 DIAGNOSIS — C61 Malignant neoplasm of prostate: Secondary | ICD-10-CM | POA: Diagnosis not present

## 2023-01-29 DIAGNOSIS — C775 Secondary and unspecified malignant neoplasm of intrapelvic lymph nodes: Secondary | ICD-10-CM | POA: Diagnosis not present

## 2023-01-29 LAB — RAD ONC ARIA SESSION SUMMARY
Course Elapsed Days: 13
Plan Fractions Treated to Date: 10
Plan Prescribed Dose Per Fraction: 5 Gy
Plan Total Fractions Prescribed: 10
Plan Total Prescribed Dose: 50 Gy
Reference Point Dosage Given to Date: 50 Gy
Reference Point Session Dosage Given: 5 Gy
Session Number: 10

## 2023-02-04 ENCOUNTER — Other Ambulatory Visit: Payer: Self-pay

## 2023-02-05 ENCOUNTER — Ambulatory Visit: Payer: Medicare HMO | Admitting: Hematology

## 2023-02-05 ENCOUNTER — Other Ambulatory Visit: Payer: Self-pay

## 2023-02-05 ENCOUNTER — Other Ambulatory Visit: Payer: Medicare HMO

## 2023-02-05 NOTE — Progress Notes (Signed)
[facility] [address] [place] [address]    Clinic Day:  02/13/2023  Referring physician: Richardean Chimera, MD  Patient Care Team: Richardean Chimera, MD as PCP - General (Family Medicine) Wyline Mood Dorothe Pea, MD as PCP - Cardiology (Cardiology) Jena Gauss Gerrit Friends, MD as Consulting Physician (Gastroenterology) Doreatha Massed, MD as Medical Oncologist (Medical Oncology) Therese Sarah, RN as Oncology Nurse Navigator (Medical Oncology)   ASSESSMENT & PLAN:   Assessment: 1.  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 Ileene Hutchinson on 09/22/2018 - Final pathology: PT3b N0, Gleason 4+3 prostate adenocarcinoma, postoperative PSA remained detectable and rose to 5.7 - Axumin PET (06/2019): Local recurrence - Salvage XRT to the prostate fossa and pelvic lymph nodes, Dr. Brendolyn Patty from 07/05/2019 - 08/31/2019, without concurrent ADT.  PSA responded well reaching a nadir of 0.1. - PSA rose again to 3.4 in 06/2022, PSMA PET scan: Oligometastatic disease involving single internal iliac lymph node. - 07/29/2022 - 08/09/2022: 10 fx course of UHRT to right internal iliac node, PSA only decreased slightly to 3.1 in 10/2022, further went up to 5 on 12/05/2022 - PSMA PET (12/19/2022): New radiotracer avid 2 mm infrarenal retrocaval lymph node and a minimal increase size of the previously treated right common iliac lymph node measuring 3 mm compared to 2 mm previously, no evidence of recurrence in the prostatectomy bed or bone mets. - Loading dose of Firmagon with Dr. Annabell Howells on 01/09/2023 - Abiraterone and prednisone started on 01/18/2023. - XRT to the retrocaval lymph node and right common iliac lymph node completed on 01/29/2023, 10 fractions   2.  Social/family history: - He lives with wife at home.  He is independent of all ADLs and IADLs.  Uses cane for longer distances.  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  Current active smoker, 1 pack/day for 45 years. - Father had lung cancer and was smoker.   Plan: 1.  Metastatic CSPC to abdominal/high pelvic lymph node (low-volume): - He started Zytiga 500 mg daily for 1 week and increase to 1000 mg daily. - He finished radiation on 01/29/2023. - Labs today: Normal LFTs.  Potassium was 4.2.  CBC was grossly normal. - Continue Zytiga 1000 mg daily along with prednisone 5 mg daily. - Will follow-up on germline mutation testing. - RTC 3 weeks for follow-up with repeat labs.  2.  Hypomagnesemia: - We will start him on magnesium 400 mg daily.  Orders Placed This Encounter  Procedures   CBC with Differential    Standing Status:   Future    Standing Expiration Date:   02/06/2024   Comprehensive metabolic panel    Standing Status:   Future    Standing Expiration Date:   02/06/2024   Magnesium    Standing Status:   Future    Standing Expiration Date:   02/06/2024      I,Katie Daubenspeck,acting as a scribe for Doreatha Massed, MD.,have documented all relevant documentation on the behalf of Doreatha Massed, MD,as directed by  Doreatha Massed, MD while in the presence of Doreatha Massed, MD.   I, Doreatha Massed MD, have reviewed the above documentation for accuracy and completeness, and I agree with the above.   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 RALP 09/22/18 (Dr. Ileene Hutchinson) 2. Salvage XRT + ADT 07/05/2019 - 08/31/2019 (Dr. Brendolyn Patty) 3. UHRT to right internal iliac node 07/29/22 - 08/09/22 4. UHRT to infrarenal retrocaval lymph node 01/16/23 -  01/29/23  Current Therapy:  Deborra Medina and Abiraterone    HISTORY OF PRESENT ILLNESS:   Oncology History   No history exists.     INTERVAL HISTORY:   Dan [name] is a 77 y.o. 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 He was last seen by me on 01/14/23 in consultation.  He completed radiation to the involved lymph node on 01/29/23.  Today, he states that he is doing well overall. His appetite level is at 100%. His energy level is at 60%.  PAST MEDICAL HISTORY:   Past Medical History: Past Medical History:  Diagnosis Date   BPH (benign prostatic hyperplasia)    COPD (chronic obstructive pulmonary disease)    Depression    ED (erectile dysfunction)    Gout    Hyperlipidemia    Hypertension    Loss of hearing    Nocturia    Paroxysmal atrial fibrillation    Prostate cancer     Surgical History: Past Surgical History:  Procedure Laterality Date   CARDIOVASCULAR STRESS TEST  12/31/2004   normal perfuction nuclear study w/ no evidence ischemia /  normal LV function and wall motion , ef 52%   PROSTATECTOMY  2019   TOTAL HIP ARTHROPLASTY      Social History: Social History   Socioeconomic History   Marital status: Married    Spouse name: Not on file   Number of children: Not on file   Years of education: Not on file   Highest education level: Not on file  Occupational History   Not on file  Tobacco Use   Smoking status: Every Day    Types: Cigarettes    Start date: 06/12/1967   Smokeless tobacco: Never  Vaping Use   Vaping Use: Never used  Substance and Sexual Activity   Alcohol use: Not Currently   Drug use: Not Currently   Sexual activity: Not on file  Other Topics Concern   Not on file  Social History Narrative   Not on file   Social Determinants of Health   Financial Resource Strain: Not on file  Food Insecurity: No Food Insecurity (01/14/2023)   Hunger Vital Sign    Worried About  Running Out of Food in the Last Year: Never true    Ran Out of Food in the Last Year: Never true  Transportation Needs: No Transportation Needs (01/14/2023)   PRAPARE - Administrator, Civil Service (Medical): No    Lack of Transportation (Non-Medical): No  Physical Activity: Not on file  Stress: Not on file  Social Connections: Not on file  Intimate Partner Violence: Not At Risk (01/14/2023)   Humiliation, Afraid, Rape, and Kick questionnaire    Fear of Current or Ex-Partner: No    Emotionally Abused: No    Physically Abused: No    Sexually Abused: No    Family History: Family History  Problem Relation Age of Onset   Hypertension Mother    Cancer Father  lung   Colon cancer Neg Hx    Inflammatory bowel disease Neg Hx    Pancreatic cancer Neg Hx     Current Medications:  Current Outpatient Medications:    abiraterone acetate (ZYTIGA) 250 MG tablet, Take 4 tablets (1,000 mg total) by mouth daily. Take on an empty stomach 1 hour before or 2 hours after a meal, Disp: 120 tablet, Rfl: 3   albuterol (PROVENTIL HFA;VENTOLIN HFA) 108 (90 Base) MCG/ACT inhaler, Inhale 2 puffs into the lungs every 6 (six) hours as needed for wheezing or shortness of breath., Disp: , Rfl:    albuterol (PROVENTIL) (2.5 MG/3ML) 0.083% nebulizer solution, Take 2.5 mg by nebulization every 6 (six) hours as needed., Disp: , Rfl:    allopurinol (ZYLOPRIM) 100 MG tablet, Take 100 mg by mouth daily., Disp: , Rfl:    Ascorbic Acid (VITAMIN C) 1000 MG tablet, Take 1,000 mg by mouth daily., Disp: , Rfl:    aspirin EC 81 MG tablet, Take 81 mg by mouth daily. Swallow whole., Disp: , Rfl:    cholecalciferol (VITAMIN D3) 25 MCG (1000 UNIT) tablet, Take 1,000 Units by mouth daily., Disp: , Rfl:    colchicine 0.6 MG tablet, Take 1 tablet by mouth daily as needed., Disp: , Rfl:    diltiazem (CARDIZEM CD) 360 MG 24 hr capsule, Take by mouth daily., Disp: , Rfl:    magnesium oxide (MAG-OX) 400 (240 Mg) MG  tablet, Take 1 tablet (400 mg total) by mouth daily., Disp: 30 tablet, Rfl: 3   megestrol (MEGACE) 40 MG/ML suspension, Take by mouth., Disp: , Rfl:    potassium chloride (MICRO-K) 10 MEQ CR capsule, TAKE 2 CAPSULES BY MOUTH DAILY., Disp: 7 capsule, Rfl: 0   predniSONE (DELTASONE) 5 MG tablet, Take 10 mg by mouth daily., Disp: , Rfl:    predniSONE (DELTASONE) 5 MG tablet, Take 1 tablet (5 mg total) by mouth daily with breakfast., Disp: 90 tablet, Rfl: 1   TRELEGY ELLIPTA 200-62.5-25 MCG/ACT AEPB, Inhale 1 puff into the lungs daily., Disp: , Rfl:    Zinc 50 MG TABS, Take 1 tablet by mouth daily., Disp: , Rfl:    losartan (COZAAR) 25 MG tablet, Take 1 tablet (25 mg total) by mouth daily., Disp: 90 tablet, Rfl: 0   Allergies: Allergies  Allergen Reactions   Tamsulosin Hives and Itching    REVIEW OF SYSTEMS:   Review of Systems  Constitutional:  Negative for chills, fatigue and fever.  HENT:   Negative for lump/mass, mouth sores, nosebleeds, sore throat and trouble swallowing.   Eyes:  Negative for eye problems.  Respiratory:  Negative for cough and shortness of breath.   Cardiovascular:  Negative for chest pain, leg swelling and palpitations.  Gastrointestinal:  Positive for constipation. Negative for abdominal pain, diarrhea, nausea and vomiting.  Genitourinary:  Positive for difficulty urinating. Negative for bladder incontinence, dysuria, frequency, hematuria and nocturia.   Musculoskeletal:  Negative for arthralgias, back pain, flank pain, myalgias and neck pain.  Skin:  Negative for itching and rash.  Neurological:  Negative for dizziness, headaches and numbness.  Hematological:  Does not bruise/bleed easily.  Psychiatric/Behavioral:  Negative for depression, sleep disturbance and suicidal ideas. The patient is not nervous/anxious.   All other systems reviewed and are negative.    VITALS:   Blood pressure (!) 142/52, pulse 63, temperature 97.9 F (36.6 C), temperature source  Tympanic, resp. rate 18, height 5' 10.5" (1.791 m), weight 145 lb 11.2 oz (66.1 kg), SpO2  100 %.  Wt Readings from Last 3 Encounters:  02/13/23 145 lb (65.8 kg)  02/06/23 145 lb 11.2 oz (66.1 kg)  01/14/23 147 lb 4.8 oz (66.8 kg)    Body mass index is 20.61 kg/m.  Performance status (ECOG): 1 - Symptomatic but completely ambulatory  PHYSICAL EXAM:   Physical Exam Vitals and nursing note reviewed. Exam conducted with a chaperone present.  Constitutional:      Appearance: Normal appearance.  Cardiovascular:     Rate and Rhythm: Normal rate and regular rhythm.     Pulses: Normal pulses.     Heart sounds: Normal heart sounds.  Pulmonary:     Effort: Pulmonary effort is normal.     Breath sounds: Normal breath sounds.  Abdominal:     Palpations: Abdomen is soft. There is no hepatomegaly, splenomegaly or mass.     Tenderness: There is no abdominal tenderness.  Musculoskeletal:     Right lower leg: No edema.     Left lower leg: No edema.  Lymphadenopathy:     Cervical: No cervical adenopathy.     Right cervical: No superficial, deep or posterior cervical adenopathy.    Left cervical: No superficial, deep or posterior cervical adenopathy.     Upper Body:     Right upper body: No supraclavicular or axillary adenopathy.     Left upper body: No supraclavicular or axillary adenopathy.  Neurological:     General: No focal deficit present.     Mental Status: He is alert and oriented to person, place, and time.  Psychiatric:        Mood and Affect: Mood normal.        Behavior: Behavior normal.     LABS:      Latest Ref Rng & Units 02/06/2023    9:51 AM  CBC  WBC 4.0 - 10.5 K/uL 9.6   Hemoglobin 13.0 - 17.0 g/dL 16.112.5   Hematocrit 09.639.0 - 52.0 % 38.0   Platelets 150 - 400 K/uL 269       Latest Ref Rng & Units 02/06/2023    9:51 AM 03/28/2022   11:31 AM 01/24/2020   10:26 AM  CMP  Glucose 70 - 99 mg/dL 045115  409104  [id number]   BUN 8 - 23 mg/dL 15  16  24    Creatinine 0.61 - 1.24 mg/dL  9.140.93  7.820.93  9.561.11   Sodium 135 - 145 mmol/L 131  139  135   Potassium 3.5 - 5.1 mmol/L 4.2  4.8  3.2   Chloride 98 - 111 mmol/L 103  101  94   CO2 22 - 32 mmol/L 20  19  28    Calcium 8.9 - 10.3 mg/dL 9.8  21.310.2  9.3   Total Protein 6.5 - 8.1 g/dL 6.8     Total Bilirubin 0.3 - 1.2 mg/dL 0.4     Alkaline Phos 38 - 126 U/L 61     AST 15 - 41 U/L 15     ALT 0 - 44 U/L 12        No results found for: "CEA1", "CEA" / No results found for: "CEA1", "CEA" Lab Results  Component Value Date   PSA1 6.9 (H) 01/02/2023   No results found for: "[id number]" No results found for: "CAN125"  No results found for: "TOTALPROTELP", "ALBUMINELP", "A1GS", "A2GS", "BETS", "BETA2SER", "GAMS", "MSPIKE", "SPEI" No results found for: "TIBC", "FERRITIN", "IRONPCTSAT" No results found for: "LDH"   STUDIES:   No results found.

## 2023-02-06 ENCOUNTER — Inpatient Hospital Stay: Payer: Medicare HMO | Attending: Hematology | Admitting: Hematology

## 2023-02-06 ENCOUNTER — Inpatient Hospital Stay: Payer: Medicare HMO

## 2023-02-06 VITALS — BP 142/52 | HR 63 | Temp 97.9°F | Resp 18 | Ht 70.5 in | Wt 145.7 lb

## 2023-02-06 DIAGNOSIS — C61 Malignant neoplasm of prostate: Secondary | ICD-10-CM | POA: Insufficient documentation

## 2023-02-06 DIAGNOSIS — F17210 Nicotine dependence, cigarettes, uncomplicated: Secondary | ICD-10-CM | POA: Diagnosis not present

## 2023-02-06 DIAGNOSIS — Z9079 Acquired absence of other genital organ(s): Secondary | ICD-10-CM | POA: Insufficient documentation

## 2023-02-06 DIAGNOSIS — C775 Secondary and unspecified malignant neoplasm of intrapelvic lymph nodes: Secondary | ICD-10-CM | POA: Insufficient documentation

## 2023-02-06 DIAGNOSIS — Z7982 Long term (current) use of aspirin: Secondary | ICD-10-CM | POA: Diagnosis not present

## 2023-02-06 DIAGNOSIS — Z79899 Other long term (current) drug therapy: Secondary | ICD-10-CM | POA: Diagnosis not present

## 2023-02-06 DIAGNOSIS — Z923 Personal history of irradiation: Secondary | ICD-10-CM | POA: Diagnosis not present

## 2023-02-06 LAB — MAGNESIUM: Magnesium: 1.6 mg/dL — ABNORMAL LOW (ref 1.7–2.4)

## 2023-02-06 LAB — COMPREHENSIVE METABOLIC PANEL
ALT: 12 U/L (ref 0–44)
AST: 15 U/L (ref 15–41)
Albumin: 3.8 g/dL (ref 3.5–5.0)
Alkaline Phosphatase: 61 U/L (ref 38–126)
Anion gap: 8 (ref 5–15)
BUN: 15 mg/dL (ref 8–23)
CO2: 20 mmol/L — ABNORMAL LOW (ref 22–32)
Calcium: 9.8 mg/dL (ref 8.9–10.3)
Chloride: 103 mmol/L (ref 98–111)
Creatinine, Ser: 0.93 mg/dL (ref 0.61–1.24)
GFR, Estimated: >60 mL/min (ref >60)
Glucose, Bld: 115 mg/dL — ABNORMAL HIGH (ref 70–99)
Potassium: 4.2 mmol/L (ref 3.5–5.1)
Sodium: 131 mmol/L — ABNORMAL LOW (ref 135–145)
Total Bilirubin: 0.4 mg/dL (ref 0.3–1.2)
Total Protein: 6.8 g/dL (ref 6.5–8.1)

## 2023-02-06 LAB — CBC WITH DIFFERENTIAL/PLATELET
Abs Immature Granulocytes: 0.05 10*3/uL (ref 0.00–0.07)
Basophils Absolute: 0 10*3/uL (ref 0.0–0.1)
Basophils Relative: 0 %
Eosinophils Absolute: 0.1 10*3/uL (ref 0.0–0.5)
Eosinophils Relative: 1 %
HCT: 38 % — ABNORMAL LOW (ref 39.0–52.0)
Hemoglobin: 12.5 g/dL — ABNORMAL LOW (ref 13.0–17.0)
Immature Granulocytes: 1 %
Lymphocytes Relative: 6 %
Lymphs Abs: 0.6 10*3/uL — ABNORMAL LOW (ref 0.7–4.0)
MCH: 30.1 pg (ref 26.0–34.0)
MCHC: 32.9 g/dL (ref 30.0–36.0)
MCV: 91.6 fL (ref 80.0–100.0)
Monocytes Absolute: 0.5 10*3/uL (ref 0.1–1.0)
Monocytes Relative: 6 %
Neutro Abs: 8.3 10*3/uL — ABNORMAL HIGH (ref 1.7–7.7)
Neutrophils Relative %: 86 %
Platelets: 269 10*3/uL (ref 150–400)
RBC: 4.15 MIL/uL — ABNORMAL LOW (ref 4.22–5.81)
RDW: 17.2 % — ABNORMAL HIGH (ref 11.5–15.5)
WBC: 9.6 10*3/uL (ref 4.0–10.5)
nRBC: 0 % (ref 0.0–0.2)

## 2023-02-06 MED ORDER — MAGNESIUM OXIDE -MG SUPPLEMENT 400 (240 MG) MG PO TABS: 400.0000 mg | ORAL_TABLET | Freq: Every day | ORAL | 3 refills | Status: DC

## 2023-02-06 NOTE — Patient Instructions (Addendum)
[facility]  Discharge Instructions  You were seen and examined today by Dr. Delton Coombes.  Dr. Delton Coombes discussed your most recent lab work which is good. Your Magnesium is slightly low, so Dr. Delton Coombes has prescribed Magnesium to be taken at home. One tablet daily.  Follow-up as scheduled.  Thank you for choosing [facility] to provide your oncology and hematology care.   To afford each patient quality time with our provider, please arrive at least 15 minutes before your scheduled appointment time. You may need to reschedule your appointment if you arrive late (10 or more minutes). Arriving late affects you and other patients whose appointments are after yours.  Also, if you miss three or more appointments without notifying the office, you may be dismissed from the clinic at the provider's discretion.    Again, thank you for choosing [facility].  Our hope is that these requests will decrease the amount of time that you wait before being seen by our physicians.   If you have a lab appointment with the [facility] - please note that after April 8th, all labs will be drawn in the cancer center.  You do not have to check in or register with the main entrance as you have in the past but will complete your check-in at the cancer center.            _____________________________________________________________  Should you have questions after your visit to [facility], please contact our office at [phone] and follow the prompts.  Our office hours are 8:00 a.m. to 4:30 p.m. Monday - Thursday and 8:00 a.m. to 2:30 p.m. Friday.  Please note that voicemails left after 4:00 p.m. may not be returned until the following business day.  We are closed weekends and all major holidays.  You do have access to a nurse 24-7, just call the main number to the clinic [phone] and do not press any options, hold on the line and  a nurse will answer the phone.    For prescription refill requests, have your pharmacy contact our office and allow 72 hours.    Masks are no longer required in the cancer centers. If you would like for your care team to wear a mask while they are taking care of you, please let them know. You may have one support person who is at least 75 years old accompany you for your appointments.

## 2023-02-06 NOTE — Progress Notes (Signed)
Patient is taking Zytiga as prescribed.  He has not missed any doses and reports no side effects at this time.   

## 2023-02-07 ENCOUNTER — Other Ambulatory Visit: Payer: Medicare HMO

## 2023-02-07 ENCOUNTER — Other Ambulatory Visit: Payer: Self-pay

## 2023-02-10 ENCOUNTER — Other Ambulatory Visit (HOSPITAL_COMMUNITY): Payer: Self-pay

## 2023-02-10 ENCOUNTER — Other Ambulatory Visit: Payer: Self-pay

## 2023-02-12 ENCOUNTER — Telehealth: Payer: Self-pay | Admitting: Cardiology

## 2023-02-12 MED ORDER — LOSARTAN POTASSIUM 25 MG PO TABS: 25.0000 mg | ORAL_TABLET | Freq: Every day | ORAL | 0 refills | Status: DC

## 2023-02-12 NOTE — Telephone Encounter (Signed)
*  STAT* If patient is at the pharmacy, call can be transferred to refill team.   1. Which medications need to be refilled? (please list name of each medication and dose if known) losartan (COZAAR) 25 MG tablet   2. Which pharmacy/location (including street and city if local pharmacy) is medication to be sent to? [id number] - [place], [place] - [address]   3. Do they need a 30 day or 90 day supply? 90 day

## 2023-02-13 ENCOUNTER — Encounter: Payer: Self-pay | Admitting: Urology

## 2023-02-13 ENCOUNTER — Ambulatory Visit: Payer: Medicare HMO | Admitting: Urology

## 2023-02-13 VITALS — BP 159/77 | HR 90 | Ht 70.5 in | Wt 145.0 lb

## 2023-02-13 DIAGNOSIS — C775 Secondary and unspecified malignant neoplasm of intrapelvic lymph nodes: Secondary | ICD-10-CM | POA: Diagnosis not present

## 2023-02-13 DIAGNOSIS — C61 Malignant neoplasm of prostate: Secondary | ICD-10-CM | POA: Diagnosis not present

## 2023-02-13 LAB — URINALYSIS, ROUTINE W REFLEX MICROSCOPIC
Bilirubin, UA: NEGATIVE
Glucose, UA: NEGATIVE
Ketones, UA: NEGATIVE
Leukocytes,UA: NEGATIVE
Nitrite, UA: NEGATIVE
Protein,UA: NEGATIVE
RBC, UA: NEGATIVE
Specific Gravity, UA: 1.015 (ref 1.005–1.030)
Urobilinogen, Ur: 0.2 mg/dL (ref 0.2–1.0)
pH, UA: 6.5 (ref 5.0–7.5)

## 2023-02-13 MED ORDER — DEGARELIX ACETATE 80 MG ~~LOC~~ SOLR
80.0000 mg | Freq: Once | SUBCUTANEOUS | Status: AC
Start: 2023-02-13 — End: 2023-02-13
  Administered 2023-02-13: 80 mg via SUBCUTANEOUS

## 2023-02-13 MED ORDER — DEGARELIX ACETATE 80 MG ~~LOC~~ SOLR
80.0000 mg | Freq: Once | SUBCUTANEOUS | Status: DC
Start: 2023-02-13 — End: 2023-02-13

## 2023-02-13 NOTE — Progress Notes (Signed)
Firmagon Sub Q Injection  Due to Prostate Cancer patient is present today for a Firmagon Injection.   Medication: Firmagon (Degarelix)  Dose: 80mg  Location: left upper abdomen Patient tolerated well, no complications were noted  Performed by: Geneal Huebert LPN  Follow up: Keep scheduled [place]

## 2023-02-13 NOTE — Progress Notes (Signed)
Subjective: 1. Prostate cancer   2. Prostate cancer metastatic to intrapelvic lymph node    02/13/23: Dan [name] returns today in f/u. His PSA was 6.9 on 01/02/23.  He got his first firmagon shot on 01/09/23 and he had SBRT for the recurrent nodes in late March.  He saw Dr. Ellin SabaKatragadda on 01/14/23 and has been started on Abiraterone/pred to intensify his ADT.  He has some hot flashes but no fatigue.  He has no GI complaints.   He had a site reaction with the initial firmagon but it has resolved.   01/02/23: Dan [name] returns today in f/u.  His PSA was up to 5.0 on 12/05/22.  A repeat PET showed uptake in a 2mm retrocaval node and residual activity in the treated ext iliac node.  He has seen Dr. [name] and is planning possible additional SBRT to the new node but he is reluctant to retreat the iliac node.   He has no weight loss or bone pain.   12/05/22: Dan [name] returns today in f/u.  He had  a PSMA PET in 8/23 and had a single area of uptake in a right common iliac node.  He had SBRT to the node in 10/23.  His prior treatment had been an RALP followed by salvage RT as noted below.   We don't have a PSA prior to this visit.  His PSA was 3.4 prior to the radiation and 3.1 in December.  He has no pain or weight loss.  He is voiding ok and his UA is clear. He has minimal leakage after voiding.  There was B2 right renal cyst with rim calcification on the PET.   06/06/22: Dan [name] returns today in f/u.  His repeat cytology on 04/04/22 was negative.  He was supposed to have a PSMA PET prior to this visit but that wasn't done.  He has no bone pain and he has gained some weight.  He has had no further hematuria.    04/04/22: Dan [name] returns today in f/u for his history of gross hematuria and prostate cancer with a rising PSA post prostatectomy.   The CT hematuria study showed possible bladder wall thickening and on  my review there was a possible lesion at the left bladder base.  He had no evidence of prostate cancer mets on CT but there is  uptake in a left posterior rib on bone scan.  He does report prior rib fractures.  He has some right shoulder uptake as well that is probably arthritis.  He has had no more hematuria.    03/28/22: Dan [name] is a 77 yo male with a history of prostate cancer that was bulky on the left with Gleason 8 disease on biospy for a PSA of 12.8.  Prostate volume was 28ml.  He was treated with RALP with Dr. [name] in 2019 and was found to have T3b N0 M0 Gleason 7(4+3) prostate cancer on surgical path with a nadir PSA post op of 2.7 and a subsequent rise to 5.7 by 05/12/19 and an Axumin PET that only showed prostate bed uptake.  He then had radiation therapy by Dr. Brendolyn PattyMoose for a PSA recurrence in 2020.  He is referred now with hematuria and a cytology that showed cells suspicious for high grade malignancy.  He has a smoker.  His Cr was 1.14 on 02/01/22 and his PSA was 2.2.  It had gotten down to 0.1 after the radiation therapy.  He only had one episode of hematuria the day he saw Dr.  Daniel.  His UA is clear today.   He is on only an aspirin for his a fib. He has only rare incontinence.  He has post prostatectomy ED.     ROS:  Review of Systems  Respiratory:  Negative for shortness of breath.   Musculoskeletal:  Positive for joint pain (right shoulder).  All other systems reviewed and are negative.   Allergies  Allergen Reactions   Tamsulosin Hives and Itching    Past Medical History:  Diagnosis Date   BPH (benign prostatic hyperplasia)    COPD (chronic obstructive pulmonary disease)    Depression    ED (erectile dysfunction)    Gout    Hyperlipidemia    Hypertension    Loss of hearing    Nocturia    Paroxysmal atrial fibrillation    Prostate cancer     Past Surgical History:  Procedure Laterality Date   CARDIOVASCULAR STRESS TEST  12/31/2004   normal perfuction nuclear study w/ no evidence ischemia /  normal LV function and wall motion , ef 52%   PROSTATECTOMY  2019   TOTAL HIP ARTHROPLASTY       Social History   Socioeconomic History   Marital status: Married    Spouse name: Not on file   Number of children: Not on file   Years of education: Not on file   Highest education level: Not on file  Occupational History   Not on file  Tobacco Use   Smoking status: Every Day    Types: Cigarettes    Start date: 06/12/1967   Smokeless tobacco: Never  Vaping Use   Vaping Use: Never used  Substance and Sexual Activity   Alcohol use: Not Currently   Drug use: Not Currently   Sexual activity: Not on file  Other Topics Concern   Not on file  Social History Narrative   Not on file   Social Determinants of Health   Financial Resource Strain: Not on file  Food Insecurity: No Food Insecurity (01/14/2023)   Hunger Vital Sign    Worried About Running Out of Food in the Last Year: Never true    Ran Out of Food in the Last Year: Never true  Transportation Needs: No Transportation Needs (01/14/2023)   PRAPARE - Administrator, Civil Service (Medical): No    Lack of Transportation (Non-Medical): No  Physical Activity: Not on file  Stress: Not on file  Social Connections: Not on file  Intimate Partner Violence: Not At Risk (01/14/2023)   Humiliation, Afraid, Rape, and Kick questionnaire    Fear of Current or Ex-Partner: No    Emotionally Abused: No    Physically Abused: No    Sexually Abused: No    Family History  Problem Relation Age of Onset   Hypertension Mother    Cancer Father        lung   Colon cancer Neg Hx    Inflammatory bowel disease Neg Hx    Pancreatic cancer Neg Hx     Anti-infectives: Anti-infectives (From admission, onward)    None       Current Outpatient Medications  Medication Sig Dispense Refill   abiraterone acetate (ZYTIGA) 250 MG tablet Take 4 tablets (1,000 mg total) by mouth daily. Take on an empty stomach 1 hour before or 2 hours after a meal 120 tablet 3   albuterol (PROVENTIL HFA;VENTOLIN HFA) 108 (90 Base) MCG/ACT inhaler  Inhale 2 puffs into the lungs every 6 (six) hours as  needed for wheezing or shortness of breath.     albuterol (PROVENTIL) (2.5 MG/3ML) 0.083% nebulizer solution Take 2.5 mg by nebulization every 6 (six) hours as needed.     allopurinol (ZYLOPRIM) 100 MG tablet Take 100 mg by mouth daily.     Ascorbic Acid (VITAMIN C) 1000 MG tablet Take 1,000 mg by mouth daily.     aspirin EC 81 MG tablet Take 81 mg by mouth daily. Swallow whole.     cholecalciferol (VITAMIN D3) 25 MCG (1000 UNIT) tablet Take 1,000 Units by mouth daily.     colchicine 0.6 MG tablet Take 1 tablet by mouth daily as needed.     diltiazem (CARDIZEM CD) 360 MG 24 hr capsule Take by mouth daily.     losartan (COZAAR) 25 MG tablet Take 1 tablet (25 mg total) by mouth daily. 90 tablet 0   magnesium oxide (MAG-OX) 400 (240 Mg) MG tablet Take 1 tablet (400 mg total) by mouth daily. 30 tablet 3   megestrol (MEGACE) 40 MG/ML suspension Take by mouth.     potassium chloride (MICRO-K) 10 MEQ CR capsule TAKE 2 CAPSULES BY MOUTH DAILY. 7 capsule 0   predniSONE (DELTASONE) 5 MG tablet Take 10 mg by mouth daily.     predniSONE (DELTASONE) 5 MG tablet Take 1 tablet (5 mg total) by mouth daily with breakfast. 90 tablet 1   TRELEGY ELLIPTA 200-62.5-25 MCG/ACT AEPB Inhale 1 puff into the lungs daily.     Zinc 50 MG TABS Take 1 tablet by mouth daily.     No current facility-administered medications for this visit.     Objective: Vital signs in last 24 hours: BP (!) 159/77   Pulse 90   Ht 5' 10.5" (1.791 m)   Wt 145 lb (65.8 kg)   BMI 20.51 kg/m   Intake/Output from previous day: No intake/output data recorded. Intake/Output this shift: @   Physical Exam Vitals reviewed.  Constitutional:      Appearance: Normal appearance.  Neurological:     Mental Status: He is alert.     Lab Results:  No results found for this or any previous visit (from the past 24 hour(s)).      BMET No results for input(s): "NA", "K",  "CL", "CO2", "GLUCOSE", "BUN", "CREATININE", "CALCIUM" in the last 72 hours. PT/INR No results for input(s): "LABPROT", "INR" in the last 72 hours. ABG No results for input(s): "PHART", "HCO3" in the last 72 hours.  Invalid input(s): "PCO2", "PO2" Recent Results (from the past 2160 hour(s))  Urinalysis, Routine w reflex microscopic     Status: None   Collection Time: 12/05/22  3:18 PM  Result Value Ref Range   Specific Gravity, UA 1.025 1.005 - 1.030   pH, UA 5.5 5.0 - 7.5   Color, UA Yellow Yellow   Appearance Ur Clear Clear   Leukocytes,UA Negative Negative   Protein,UA Negative Negative/Trace   Glucose, UA Negative Negative   Ketones, UA Negative Negative   RBC, UA Negative Negative   Bilirubin, UA Negative Negative   Urobilinogen, Ur 0.2 0.2 - 1.0 mg/dL   Nitrite, UA Negative Negative   Microscopic Examination Comment     Comment: Microscopic not indicated and not performed.  PSA     Status: Abnormal   Collection Time: 12/05/22  3:52 PM  Result Value Ref Range   Prostate Specific Ag, Serum 5.0 (H) 0.0 - 4.0 ng/mL    Comment: Roche ECLIA methodology. According to the American Urological Association,  Serum PSA should decrease and remain at undetectable levels after radical prostatectomy. The AUA defines biochemical recurrence as an initial PSA value 0.2 ng/mL or greater followed by a subsequent confirmatory PSA value 0.2 ng/mL or greater. Values obtained with different assay methods or kits cannot be used interchangeably. Results cannot be interpreted as absolute evidence of the presence or absence of malignant disease.   PSA     Status: Abnormal   Collection Time: 01/02/23  2:37 PM  Result Value Ref Range   Prostate Specific Ag, Serum 6.9 (H) 0.0 - 4.0 ng/mL    Comment: Roche ECLIA methodology. According to the American Urological Association, Serum PSA should decrease and remain at undetectable levels after radical prostatectomy. The AUA defines biochemical  recurrence as an initial PSA value 0.2 ng/mL or greater followed by a subsequent confirmatory PSA value 0.2 ng/mL or greater. Values obtained with different assay methods or kits cannot be used interchangeably. Results cannot be interpreted as absolute evidence of the presence or absence of malignant disease.   Testosterone     Status: None   Collection Time: 01/02/23  2:37 PM  Result Value Ref Range   Testosterone 310 264 - 916 ng/dL    Comment: Adult male reference interval is based on a population of healthy nonobese males (BMI <30) between 7 and 82 years old. Travison, et.al. JCEM [phone]. PMID: [id number].   Rad Jeralene Huff Session Summary     Status: None   Collection Time: 01/16/23  2:01 PM  Result Value Ref Range   Course ID C2_Abd    Course Start Date 01/06/2023    Session Number 1    Course First Treatment Date 01/16/2023  1:56 PM    Course Last Treatment Date 01/16/2023  1:59 PM    Course Elapsed Days 0    Reference Point ID Abd_UHRT DP    Reference Point Dosage Given to Date 5 Gy   Reference Point Session Dosage Given 5 Gy   Plan ID Abd_UHRT    Plan Name Abd_UHRT    Plan Fractions Treated to Date 1    Plan Total Fractions Prescribed 10    Plan Prescribed Dose Per Fraction 5 Gy   Plan Total Prescribed Dose 50.000000 Gy   Plan Primary Reference Point Abd_UHRT DP   Rad Onc Aria Session Summary     Status: None   Collection Time: 01/17/23 12:14 PM  Result Value Ref Range   Course ID C2_Abd    Course Start Date 01/06/2023    Session Number 2    Course First Treatment Date 01/16/2023  1:56 PM    Course Last Treatment Date 01/17/2023 12:12 PM    Course Elapsed Days 1    Reference Point ID Abd_UHRT DP    Reference Point Dosage Given to Date 10 Gy   Reference Point Session Dosage Given 5 Gy   Plan ID Abd_UHRT    Plan Name Abd_UHRT    Plan Fractions Treated to Date 2    Plan Total Fractions Prescribed 10    Plan Prescribed Dose Per Fraction 5 Gy   Plan Total  Prescribed Dose 50.000000 Gy   Plan Primary Reference Point Abd_UHRT DP   Rad Onc Aria Session Summary     Status: None   Collection Time: 01/20/23  9:23 AM  Result Value Ref Range   Course ID C2_Abd    Course Start Date 01/06/2023    Session Number 3    Course First Treatment Date 01/16/2023  1:56  PM    Course Last Treatment Date 01/20/2023  9:21 AM    Course Elapsed Days 4    Reference Point ID Abd_UHRT DP    Reference Point Dosage Given to Date 15 Gy   Reference Point Session Dosage Given 5 Gy   Plan ID Abd_UHRT    Plan Name Abd_UHRT    Plan Fractions Treated to Date 3    Plan Total Fractions Prescribed 10    Plan Prescribed Dose Per Fraction 5 Gy   Plan Total Prescribed Dose 50.000000 Gy   Plan Primary Reference Point Abd_UHRT DP   Rad Onc Aria Session Summary     Status: None   Collection Time: 01/21/23 12:24 PM  Result Value Ref Range   Course ID C2_Abd    Course Start Date 01/06/2023    Session Number 4    Course First Treatment Date 01/16/2023  1:56 PM    Course Last Treatment Date 01/21/2023 12:22 PM    Course Elapsed Days 5    Reference Point ID Abd_UHRT DP    Reference Point Dosage Given to Date 20 Gy   Reference Point Session Dosage Given 5 Gy   Plan ID Abd_UHRT    Plan Name Abd_UHRT    Plan Fractions Treated to Date 4    Plan Total Fractions Prescribed 10    Plan Prescribed Dose Per Fraction 5 Gy   Plan Total Prescribed Dose 50.000000 Gy   Plan Primary Reference Point Abd_UHRT DP   Rad Onc Aria Session Summary     Status: None   Collection Time: 01/22/23 11:26 AM  Result Value Ref Range   Course ID C2_Abd    Course Start Date 01/06/2023    Session Number 5    Course First Treatment Date 01/16/2023  1:56 PM    Course Last Treatment Date 01/22/2023 11:23 AM    Course Elapsed Days 6    Reference Point ID Abd_UHRT DP    Reference Point Dosage Given to Date 25 Gy   Reference Point Session Dosage Given 5 Gy   Plan ID Abd_UHRT    Plan Name Abd_UHRT    Plan Fractions  Treated to Date 5    Plan Total Fractions Prescribed 10    Plan Prescribed Dose Per Fraction 5 Gy   Plan Total Prescribed Dose 50.000000 Gy   Plan Primary Reference Point Abd_UHRT DP   Rad Onc Aria Session Summary     Status: None   Collection Time: 01/23/23 12:10 PM  Result Value Ref Range   Course ID C2_Abd    Course Start Date 01/06/2023    Session Number 6    Course First Treatment Date 01/16/2023  1:56 PM    Course Last Treatment Date 01/23/2023 12:08 PM    Course Elapsed Days 7    Reference Point ID Abd_UHRT DP    Reference Point Dosage Given to Date 30 Gy   Reference Point Session Dosage Given 5 Gy   Plan ID Abd_UHRT    Plan Name Abd_UHRT    Plan Fractions Treated to Date 6    Plan Total Fractions Prescribed 10    Plan Prescribed Dose Per Fraction 5 Gy   Plan Total Prescribed Dose 50.000000 Gy   Plan Primary Reference Point Abd_UHRT DP   Rad Onc Aria Session Summary     Status: None   Collection Time: 01/24/23 11:20 AM  Result Value Ref Range   Course ID C2_Abd    Course Start Date 01/06/2023  Session Number 7    Course First Treatment Date 01/16/2023  1:56 PM    Course Last Treatment Date 01/24/2023 11:18 AM    Course Elapsed Days 8    Reference Point ID Abd_UHRT DP    Reference Point Dosage Given to Date 35 Gy   Reference Point Session Dosage Given 5 Gy   Plan ID Abd_UHRT    Plan Name Abd_UHRT    Plan Fractions Treated to Date 7    Plan Total Fractions Prescribed 10    Plan Prescribed Dose Per Fraction 5 Gy   Plan Total Prescribed Dose 50.000000 Gy   Plan Primary Reference Point Abd_UHRT DP   Rad Onc Aria Session Summary     Status: None   Collection Time: 01/27/23 11:03 AM  Result Value Ref Range   Course ID C2_Abd    Course Start Date 01/06/2023    Session Number 8    Course First Treatment Date 01/16/2023  1:56 PM    Course Last Treatment Date 01/27/2023 11:01 AM    Course Elapsed Days 11    Reference Point ID Abd_UHRT DP    Reference Point Dosage Given to Date  40 Gy   Reference Point Session Dosage Given 5 Gy   Plan ID Abd_UHRT    Plan Name Abd_UHRT    Plan Fractions Treated to Date 8    Plan Total Fractions Prescribed 10    Plan Prescribed Dose Per Fraction 5 Gy   Plan Total Prescribed Dose 50.000000 Gy   Plan Primary Reference Point Abd_UHRT DP   Rad Onc Aria Session Summary     Status: None   Collection Time: 01/28/23  1:19 PM  Result Value Ref Range   Course ID C2_Abd    Course Start Date 01/06/2023    Session Number 9    Course First Treatment Date 01/16/2023  1:56 PM    Course Last Treatment Date 01/28/2023  1:17 PM    Course Elapsed Days 12    Reference Point ID Abd_UHRT DP    Reference Point Dosage Given to Date 45 Gy   Reference Point Session Dosage Given 5 Gy   Plan ID Abd_UHRT    Plan Name Abd_UHRT    Plan Fractions Treated to Date 9    Plan Total Fractions Prescribed 10    Plan Prescribed Dose Per Fraction 5 Gy   Plan Total Prescribed Dose 50.000000 Gy   Plan Primary Reference Point Abd_UHRT DP   Rad Onc Aria Session Summary     Status: None   Collection Time: 01/29/23 11:27 AM  Result Value Ref Range   Course ID C2_Abd    Course Start Date 01/06/2023    Session Number 10    Course First Treatment Date 01/16/2023  1:56 PM    Course Last Treatment Date 01/29/2023 11:25 AM    Course Elapsed Days 13    Reference Point ID Abd_UHRT DP    Reference Point Dosage Given to Date 50 Gy   Reference Point Session Dosage Given 5 Gy   Plan ID Abd_UHRT    Plan Name Abd_UHRT    Plan Fractions Treated to Date 10    Plan Total Fractions Prescribed 10    Plan Prescribed Dose Per Fraction 5 Gy   Plan Total Prescribed Dose 50.000000 Gy   Plan Primary Reference Point Abd_UHRT DP   CBC with Differential     Status: Abnormal   Collection Time: 02/06/23  9:51 AM  Result Value Ref Range  WBC 9.6 4.0 - 10.5 K/uL   RBC 4.15 (L) 4.22 - 5.81 MIL/uL   Hemoglobin 12.5 (L) 13.0 - 17.0 g/dL   HCT 40.9 (L) 81.1 - 91.4 %   MCV 91.6 80.0 - 100.0 fL    MCH 30.1 26.0 - 34.0 pg   MCHC 32.9 30.0 - 36.0 g/dL   RDW 78.2 (H) 95.6 - 21.3 %   Platelets 269 150 - 400 K/uL   nRBC 0.0 0.0 - 0.2 %   Neutrophils Relative % 86 %   Neutro Abs 8.3 (H) 1.7 - 7.7 K/uL   Lymphocytes Relative 6 %   Lymphs Abs 0.6 (L) 0.7 - 4.0 K/uL   Monocytes Relative 6 %   Monocytes Absolute 0.5 0.1 - 1.0 K/uL   Eosinophils Relative 1 %   Eosinophils Absolute 0.1 0.0 - 0.5 K/uL   Basophils Relative 0 %   Basophils Absolute 0.0 0.0 - 0.1 K/uL   Immature Granulocytes 1 %   Abs Immature Granulocytes 0.05 0.00 - 0.07 K/uL    Comment: Performed at [facility], [address], [place], [place] [address]  Comprehensive metabolic panel     Status: Abnormal   Collection Time: 02/06/23  9:51 AM  Result Value Ref Range   Sodium 131 (L) 135 - 145 mmol/L   Potassium 4.2 3.5 - 5.1 mmol/L   Chloride 103 98 - 111 mmol/L   CO2 20 (L) 22 - 32 mmol/L   Glucose, Bld 115 (H) 70 - 99 mg/dL    Comment: Glucose reference range applies only to samples taken after fasting for at least 8 hours.   BUN 15 8 - 23 mg/dL   Creatinine, Ser 8.46 0.61 - 1.24 mg/dL   Calcium 9.8 8.9 - 96.2 mg/dL   Total Protein 6.8 6.5 - 8.1 g/dL   Albumin 3.8 3.5 - 5.0 g/dL   AST 15 15 - 41 U/L   ALT 12 0 - 44 U/L   Alkaline Phosphatase 61 38 - 126 U/L   Total Bilirubin 0.4 0.3 - 1.2 mg/dL   GFR, Estimated >95 >28 mL/min    Comment: (NOTE) Calculated using the CKD-EPI Creatinine Equation (2021)    Anion gap 8 5 - 15    Comment: Performed at [facility], [address], [place], [place] [address]  Magnesium     Status: Abnormal   Collection Time: 02/06/23  9:51 AM  Result Value Ref Range   Magnesium 1.6 (L) 1.7 - 2.4 mg/dL    Comment: Performed at [facility], [address] [place], [place] [address]  Urinalysis, Routine w reflex microscopic     Status: None   Collection Time: 02/13/23  9:20 AM  Result Value Ref Range   Specific Gravity, UA 1.015 1.005 - 1.030   pH, UA 6.5 5.0 - 7.5   Color, UA  Yellow Yellow   Appearance Ur Clear Clear   Leukocytes,UA Negative Negative   Protein,UA Negative Negative/Trace   Glucose, UA Negative Negative   Ketones, UA Negative Negative   RBC, UA Negative Negative   Bilirubin, UA Negative Negative   Urobilinogen, Ur 0.2 0.2 - 1.0 mg/dL   Nitrite, UA Negative Negative   Microscopic Examination Comment     Comment: Microscopic not indicated and not performed.  PSA     Status: None   Collection Time: 02/13/23  9:56 AM  Result Value Ref Range   Prostate Specific Ag, Serum 0.4 0.0 - 4.0 ng/mL    Comment: Roche ECLIA  methodology. According to the American Urological Association, Serum PSA should decrease and remain at undetectable levels after radical prostatectomy. The AUA defines biochemical recurrence as an initial PSA value 0.2 ng/mL or greater followed by a subsequent confirmatory PSA value 0.2 ng/mL or greater. Values obtained with different assay methods or kits cannot be used interchangeably. Results cannot be interpreted as absolute evidence of the presence or absence of malignant disease.   Testosterone     Status: Abnormal   Collection Time: 02/13/23  9:56 AM  Result Value Ref Range   Testosterone <3 (L) 264 - 916 ng/dL    Comment: Adult male reference interval is based on a population of healthy nonobese males (BMI <30) between 34 and 76 years old. Travison, et.al. JCEM [phone]. PMID: [id number].      UA is clear.  Studies/Results: NM PET (PSMA) SKULL TO MID THIGH  Result Date: 12/20/2022 CLINICAL DATA:  Prostate carcinoma with biochemical recurrence. Prostatectomy 2019, radiation 2020, PSA 5.0, Six-month prior 3.4 and 8 months prior 2.2. EXAM: NUCLEAR MEDICINE PET SKULL BASE TO THIGH TECHNIQUE: 9.11 mCi F18 Piflufolastat (Pylarify) was injected intravenously. Full-ring PET imaging was performed from the skull base to thigh after the radiotracer. CT data was obtained and used for attenuation correction and anatomic  localization. COMPARISON:  PET-CT June 18, 2022 FINDINGS: NECK No radiotracer activity in neck lymph nodes. Incidental CT finding: None. CHEST No radiotracer accumulation within mediastinal or hilar lymph nodes. No suspicious pulmonary nodules on the CT scan. Incidental CT finding: Paraseptal emphysema. Aortic atherosclerosis. Coronary artery calcifications. ABDOMEN/PELVIS Prostate: No focal activity in the prostate bed. Lymph nodes: New radiotracer avid infrarenal retrocaval 2 mm lymph node on image 139/3. Minimal increase in size of the radiotracer right common iliac lymph node now measuring 3 mm on image 110/3 previously 2 mm. Liver: No evidence of liver metastasis. Incidental CT finding: Large fluid density right renal cysts with dense mural calcifications. Aortic atherosclerosis. Ectasia of the infrarenal abdominal aorta measuring 2.7 cm not aneurysmal by size criteria SKELETON No focal activity to suggest skeletal metastasis. Left total hip arthroplasty. Multilevel degenerative change of the spine. Degenerative change of the left hip and bilateral shoulders IMPRESSION: 1. New radiotracer avid 2 mm infrarenal retrocaval lymph node with minimal increased size of the radiotracer avid right common iliac lymph node. 2. No abnormal radiotracer avidity in the prostatectomy bed 3. No evidence of radiotracer avid osseous metastatic disease. 4. Aortic Atherosclerosis (ICD10-I70.0) and Emphysema (ICD10-J43.9). Electronically Signed   By: Maudry Mayhew M.D.   On: 12/20/2022 14:16    Notes from Dr. Ellin Saba reviewed.   Notes from Dr. Kathrynn Running reviewed.     Assessment/Plan: Prostate cancer with rising PSA s/p RALP and salvage radiation therapy.  He has a rising PSA with residual activity in the right common iliac node on PSMA PET and a new 2mm retrocaval node.   He will continue the firmagon and abiaterone and I will check a PSA and testosterone level today.  He will return to see me in 3 months but I will hold  off on ordering labs to see what Dr. Ellin Saba orders at his next visit.   Meds ordered this encounter  Medications   DISCONTD: degarelix (FIRMAGON) injection 80 mg   degarelix (FIRMAGON) injection 80 mg     Orders Placed This Encounter  Procedures   Urinalysis, Routine w reflex microscopic   PSA   Testosterone   Testosterone     Return in about 3 months (around 05/15/2023)  for He will need monthly firmagon and f/u with me in 3 months. .    CC: Dr. [name].      Bjorn Pippin 02/14/2023 [phone]

## 2023-02-14 ENCOUNTER — Other Ambulatory Visit: Payer: Self-pay | Admitting: Hematology

## 2023-02-14 LAB — PSA: Prostate Specific Ag, Serum: 0.4 ng/mL (ref 0.0–4.0)

## 2023-02-14 LAB — TESTOSTERONE: Testosterone: <3 ng/dL — ABNORMAL LOW (ref 264–916)

## 2023-02-14 NOTE — Telephone Encounter (Signed)
Spoke with pharmacist at [facility] and she said this was for a 90 day request.  Patient picked up 90 day supply on 4/4.  No need for refill at this time.  Will refuse this request and await patients need in 90 days.

## 2023-02-17 ENCOUNTER — Encounter: Payer: Self-pay | Admitting: Nurse Practitioner

## 2023-02-17 ENCOUNTER — Ambulatory Visit: Payer: Medicare HMO | Attending: Nurse Practitioner | Admitting: Nurse Practitioner

## 2023-02-17 VITALS — BP 124/68 | HR 81 | Ht 70.5 in | Wt 142.2 lb

## 2023-02-17 DIAGNOSIS — I10 Essential (primary) hypertension: Secondary | ICD-10-CM | POA: Diagnosis not present

## 2023-02-17 DIAGNOSIS — C775 Secondary and unspecified malignant neoplasm of intrapelvic lymph nodes: Secondary | ICD-10-CM | POA: Diagnosis not present

## 2023-02-17 DIAGNOSIS — I340 Nonrheumatic mitral (valve) insufficiency: Secondary | ICD-10-CM

## 2023-02-17 DIAGNOSIS — C61 Malignant neoplasm of prostate: Secondary | ICD-10-CM

## 2023-02-17 DIAGNOSIS — R531 Weakness: Secondary | ICD-10-CM | POA: Diagnosis not present

## 2023-02-17 DIAGNOSIS — I480 Paroxysmal atrial fibrillation: Secondary | ICD-10-CM

## 2023-02-17 DIAGNOSIS — Z87898 Personal history of other specified conditions: Secondary | ICD-10-CM

## 2023-02-17 NOTE — Progress Notes (Signed)
Office Visit    Patient Name: Dan [name] Date of Encounter: 02/17/2023  PCP:  Richardean Chimera, MD   [facility]  Cardiologist:  Dina Rich, MD  Advanced Practice Provider:  No care team member to display Electrophysiologist:  None [id number]}  Chief Complaint    Dan [name] is a 77 y.o. male with a hx of palpitations, PAF, mitral regurgitation, hypertension, hyperlipidemia, history of prostate cancer, BPH, COPD, and depression, who presents today for scheduled follow-up.  Past Medical History    Past Medical History:  Diagnosis Date   BPH (benign prostatic hyperplasia)    COPD (chronic obstructive pulmonary disease)    Depression    ED (erectile dysfunction)    Gout    Hyperlipidemia    Hypertension    Loss of hearing    Nocturia    Paroxysmal atrial fibrillation    Prostate cancer    Past Surgical History:  Procedure Laterality Date   CARDIOVASCULAR STRESS TEST  12/31/2004   normal perfuction nuclear study w/ no evidence ischemia /  normal LV function and wall motion , ef 52%   PROSTATECTOMY  2019   TOTAL HIP ARTHROPLASTY      Allergies  Allergies  Allergen Reactions   Tamsulosin Hives and Itching    History of Present Illness    Dan [name] is a 77 y.o. male with a PMH as mentioned above.  In 2021, 30-day monitor revealed sinus rhythm with PACs and PVCs, paroxysmal A-fib.  Was unable to tolerate Eliquis due to heavy bruising, changed to Xarelto.  Xarelto however because of GI bleeding in the past with admission to [facility].  Declined colonoscopy as inpatient was told to follow-up with outpatient GI.  NST in 2021 showed inferior infarct with minimal ischemia.  Echo in 2021 revealed EF 60 to 65%.  CTA for AAA screen at [facility] was negative for aneurysm, showed aortic atherosclerosis.  Last seen by Dr. Dina Rich around 1 year ago.  Was not on anticoagulation due to past history of GI bleed.  He was going to have  outpatient colonoscopy, but refused.  Denied any recurrent bleeding.  Patient declined to retry Xarelto.  Overall doing well.  Today he presents for scheduled follow-up.  He states he is doing well. Says he has some weakness from radiation and treatments for prostate cancer, sees Oncology. Denies any chest pain, shortness of breath, palpitations, syncope, presyncope, dizziness, orthopnea, PND, swelling or significant weight changes, acute bleeding, or claudication.  EKGs/Labs/Other Studies Reviewed:   The following studies were reviewed today:   EKG:  EKG is ordered today.  The ekg ordered today demonstrates sinus rhythm, 82 bpm, with occasional PVCs, no acute ischemic changes.  Myoview 02/2020:  There was no ST segment deviation noted during stress. Findings consistent with prior myocardial inferior/inferoseptal infarction with minimal peri-infarct ischemia. This is an intermediate risk study. Risk based primarily on decreased LVEF, fairly minimal current ischemia. The left ventricular ejection fraction is mildly decreased (45-54%).   Echo 01/2020:   1. Left ventricular ejection fraction, by estimation, is 60 to 65%. The  left ventricle has normal function. The left ventricle has no regional  wall motion abnormalities. Left ventricular diastolic parameters are  consistent with Grade I diastolic  dysfunction (impaired relaxation).   2. Right ventricular systolic function is normal. The right ventricular  size is normal. There is normal pulmonary artery systolic pressure.   3. The mitral valve is degenerative.  Mild to moderate mitral valve  regurgitation.   4. The aortic valve is tricuspid. Aortic valve regurgitation is not  visualized. No aortic stenosis is present.   5. The inferior vena cava is normal in size with greater than 50%  respiratory variability, suggesting right atrial pressure of 3 mmHg.  Monitor 02/2020:  Sinus rhythm with frequent PACs and rare PVCs with paroxysms of  possible atrial fibrillation.    Recent Labs: 02/06/2023: ALT 12; BUN 15; Creatinine, Ser 0.93; Hemoglobin 12.5; Magnesium 1.6; Platelets 269; Potassium 4.2; Sodium 131  Recent Lipid Panel No results found for: "CHOL", "TRIG", "HDL", "CHOLHDL", "VLDL", "LDLCALC", "LDLDIRECT"  Risk Assessment/Calculations:   CHA2DS2-VASc Score = 3   This indicates a 3.2% annual risk of stroke. The patient's score is based upon: CHF History: 0 HTN History: 1 Diabetes History: 0 Stroke History: 0 Vascular Disease History: 0 Age Score: 2 Gender Score: 0  Home Medications   Current Meds  Medication Sig   abiraterone acetate (ZYTIGA) 250 MG tablet Take 4 tablets (1,000 mg total) by mouth daily. Take on an empty stomach 1 hour before or 2 hours after a meal   albuterol (PROVENTIL HFA;VENTOLIN HFA) 108 (90 Base) MCG/ACT inhaler Inhale 2 puffs into the lungs every 6 (six) hours as needed for wheezing or shortness of breath.   albuterol (PROVENTIL) (2.5 MG/3ML) 0.083% nebulizer solution Take 2.5 mg by nebulization every 6 (six) hours as needed.   allopurinol (ZYLOPRIM) 100 MG tablet Take 100 mg by mouth daily.   Ascorbic Acid (VITAMIN C) 1000 MG tablet Take 1,000 mg by mouth daily.   aspirin EC 81 MG tablet Take 81 mg by mouth daily. Swallow whole.   cholecalciferol (VITAMIN D3) 25 MCG (1000 UNIT) tablet Take 1,000 Units by mouth daily.   colchicine 0.6 MG tablet Take 1 tablet by mouth daily as needed.   diltiazem (CARDIZEM CD) 360 MG 24 hr capsule Take by mouth daily.   losartan (COZAAR) 25 MG tablet Take 1 tablet (25 mg total) by mouth daily.   magnesium oxide (MAG-OX) 400 (240 Mg) MG tablet Take 1 tablet (400 mg total) by mouth daily.   megestrol (MEGACE) 40 MG/ML suspension Take by mouth.   potassium chloride (MICRO-K) 10 MEQ CR capsule TAKE 2 CAPSULES BY MOUTH DAILY.   predniSONE (DELTASONE) 5 MG tablet Take 10 mg by mouth daily.   predniSONE (DELTASONE) 5 MG tablet Take 1 tablet (5 mg total) by mouth  daily with breakfast.   TRELEGY ELLIPTA 200-62.5-25 MCG/ACT AEPB Inhale 1 puff into the lungs daily.   Zinc 50 MG TABS Take 1 tablet by mouth daily.     Review of Systems   All other systems reviewed and are otherwise negative except as noted above.  Physical Exam    VS:  BP 124/68 (BP Location: Left Arm, Patient Position: Sitting, Cuff Size: Normal)   Pulse 81   Ht 5' 10.5" (1.791 m)   Wt 142 lb 3.2 oz (64.5 kg)   SpO2 98%   BMI 20.12 kg/m  , BMI Body mass index is 20.12 kg/m.  Wt Readings from Last 3 Encounters:  02/17/23 142 lb 3.2 oz (64.5 kg)  02/13/23 145 lb (65.8 kg)  02/06/23 145 lb 11.2 oz (66.1 kg)     GEN: Thin, 77 year old male in no acute distress. HEENT: normal. Neck: Supple, no JVD, carotid bruits, or masses. Cardiac: S1/S2, overall RRR, no murmurs, rubs, or gallops. No clubbing, cyanosis, edema.  Radials/PT 2+ and equal bilaterally.  Respiratory:  Respirations regular and unlabored, clear to auscultation bilaterally. MS: No deformity or atrophy. Skin: Warm and dry, no rash. Neuro:  Strength and sensation are intact. Psych: Normal affect.  Assessment & Plan    PAF, hx of palpitations Denies any tachycardia or palpitations.  No longer on anticoagulation due to history of bleeding.  Continue diltiazem and aspirin. Heart healthy diet and regular cardiovascular exercise encouraged.   Mitral regurgitation Last echocardiogram revealed normal EF, mild to moderate MR.  Asymptomatic.  Will continue to monitor.  No medication changes.  Recommend updating echocardiogram in the next 1 to 2 years.  Hypertension Blood pressure well-controlled. Discussed to monitor BP at home at least 2 hours after medications and sitting for 5-10 minutes.  No medication changes at this time. Heart healthy diet and regular cardiovascular exercise encouraged.   Prostate cancer, weakness Patient attributes weakness to prostate cancer treatment/therapies.  Labs are closely being followed  by Dr. Kirtland Bouchard with oncology.  Discussed when to notify our office.  He verbalized understanding.  No medication changes at this time.  Continue to follow-up with oncology and PCP.  Disposition: Follow up in 1 year(s) with Dina Rich, MD or APP.  Signed, Sharlene Dory, NP 02/17/2023, 12:30 PM [facility]

## 2023-02-17 NOTE — Patient Instructions (Signed)

## 2023-02-25 ENCOUNTER — Other Ambulatory Visit: Payer: Self-pay

## 2023-02-25 DIAGNOSIS — C775 Secondary and unspecified malignant neoplasm of intrapelvic lymph nodes: Secondary | ICD-10-CM

## 2023-02-25 NOTE — Progress Notes (Signed)
[facility] [address], [place] [address]    Clinic Day:  02/26/2023  Referring physician: Richardean Chimera, MD  Patient Care Team: Richardean Chimera, MD as PCP - General (Family Medicine) Wyline Mood Dorothe Pea, MD as PCP - Cardiology (Cardiology) Jena Gauss Gerrit Friends, MD as Consulting Physician (Gastroenterology) Doreatha Massed, MD as Medical Oncologist (Medical Oncology) Therese Sarah, RN as Oncology Nurse Navigator (Medical Oncology)   ASSESSMENT & PLAN:   Assessment: 1.  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 Ileene Hutchinson on 09/22/2018 - Final pathology: PT3b N0, Gleason 4+3 prostate adenocarcinoma, postoperative PSA remained detectable and rose to 5.7 - Axumin PET (06/2019): Local recurrence - Salvage XRT to the prostate fossa and pelvic lymph nodes, Dr. Brendolyn Patty from 07/05/2019 - 08/31/2019, without concurrent ADT.  PSA responded well reaching a nadir of 0.1. - PSA rose again to 3.4 in 06/2022, PSMA PET scan: Oligometastatic disease involving single internal iliac lymph node. - 07/29/2022 - 08/09/2022: 10 fx course of UHRT to right internal iliac node, PSA only decreased slightly to 3.1 in 10/2022, further went up to 5 on 12/05/2022 - PSMA PET (12/19/2022): New radiotracer avid 2 mm infrarenal retrocaval lymph node and a minimal increase size of the previously treated right common iliac lymph node measuring 3 mm compared to 2 mm previously, no evidence of recurrence in the prostatectomy bed or bone mets. - Loading dose of Firmagon with Dr. Annabell Howells on 01/09/2023 - Abiraterone and prednisone started on 01/18/2023. - XRT to the retrocaval lymph node and right common iliac lymph node completed on 01/29/2023, 10 fractions   2.  Social/family history: - He lives with wife at home.  He is independent of all ADLs and IADLs.  Uses cane for longer distances.  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  Current active smoker, 1 pack/day for 45 years. - Father had lung cancer and was smoker.    Plan: 1.  Metastatic CSPC to abdominal/high pelvic lymph node (low-volume): - He is tolerating Zytiga and prednisone reasonably well. - Labs today: Normal LFTs.  Creatinine is 1.05.  CBC was grossly normal. - PSA on 02/13/2023: 0.4. - Continue Zytiga 1000 mg daily and prednisone 5 mg daily. - RTC 4 weeks for follow-up.  Will plan on getting DEXA scan prior to next visit. - We have drawn genetic testing labs today.   2.  Hypomagnesemia: - Continue magnesium 1 tablet daily.  Magnesium is normal today.  3.  Mild hypercalcemia: - Calcium today is 10.7.  Last calcium was 9.8 on 02/06/2023. - He is taking Caltrate 2 tablets/day which was started in March of this year. - I have recommended that he stop taking Caltrate.  I will recheck his calcium in 4 weeks.  If it comes down to normal, we will decrease Caltrate to 1 tablet daily.  Orders Placed This Encounter  Procedures   DG Bone Density    Standing Status:   Future    Standing Expiration Date:   02/26/2024    Order Specific Question:   Reason for Exam (SYMPTOM  OR DIAGNOSIS REQUIRED)    Answer:   prostate cancer    Order Specific Question:   Preferred imaging location?    Answer:   [facility]    Order Specific Question:   Release to patient    Answer:   Immediate  CBC with Differential/Platelet    Standing Status:   Future    Standing Expiration Date:   02/26/2024    Order Specific Question:   Release to patient    Answer:   Immediate   Comprehensive metabolic panel    Standing Status:   Future    Standing Expiration Date:   02/26/2024    Order Specific Question:   Release to patient    Answer:   Immediate   Magnesium    Standing Status:   Future    Standing Expiration Date:   02/26/2024    Order Specific Question:   Release to patient    Answer:   Immediate   PSA    Standing Status:   Future     Standing Expiration Date:   02/26/2024      I,Katie Daubenspeck,acting as a scribe for Doreatha Massed, MD.,have documented all relevant documentation on the behalf of Doreatha Massed, MD,as directed by  Doreatha Massed, MD while in the presence of Doreatha Massed, MD.   I, Doreatha Massed MD, have reviewed the above documentation for accuracy and completeness, and I agree with the above.   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 RALP 09/22/18 (Dr. Ileene Hutchinson) 2. Salvage XRT + ADT 07/05/2019 - 08/31/2019 (Dr. Brendolyn Patty) 3. UHRT to right internal iliac node 07/29/22 - 08/09/22 4. UHRT to infrarenal retrocaval lymph node 01/16/23 - 01/29/23  Current Therapy:  Deborra Medina and Abiraterone    HISTORY OF PRESENT ILLNESS:   Oncology History   No history exists.     INTERVAL HISTORY:   Dan [name] is a 77 y.o. 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 He was last seen by me on 02/06/23.  Today, he states that he is doing well overall. His appetite level is at 100%. His energy level is at 40%.  PAST MEDICAL HISTORY:   Past Medical History: Past Medical History:  Diagnosis Date   BPH (benign prostatic hyperplasia)    COPD (chronic obstructive pulmonary disease)    Depression    ED (erectile dysfunction)    Gout    Hyperlipidemia    Hypertension    Loss of hearing    Nocturia    Paroxysmal atrial fibrillation    Prostate cancer     Surgical History: Past Surgical History:  Procedure Laterality Date   CARDIOVASCULAR STRESS TEST  12/31/2004    normal perfuction nuclear study w/ no evidence ischemia /  normal LV function and wall motion , ef 52%   PROSTATECTOMY  2019   TOTAL HIP ARTHROPLASTY      Social History: Social History   Socioeconomic History   Marital status: Married    Spouse name: Not on file   Number of children: Not on file   Years of education: Not on file   Highest education level: Not on file  Occupational History   Not on file  Tobacco Use   Smoking status: Every Day    Types: Cigarettes    Start date: 06/12/1967    Passive exposure: Never   Smokeless tobacco: Never  Vaping Use  Vaping Use: Never used  Substance and Sexual Activity   Alcohol use: Not Currently   Drug use: Not Currently   Sexual activity: Not on file  Other Topics Concern   Not on file  Social History Narrative   Not on file   Social Determinants of Health   Financial Resource Strain: Not on file  Food Insecurity: No Food Insecurity (01/14/2023)   Hunger Vital Sign    Worried About Running Out of Food in the Last Year: Never true    Ran Out of Food in the Last Year: Never true  Transportation Needs: No Transportation Needs (01/14/2023)   PRAPARE - Administrator, Civil Service (Medical): No    Lack of Transportation (Non-Medical): No  Physical Activity: Not on file  Stress: Not on file  Social Connections: Not on file  Intimate Partner Violence: Not At Risk (01/14/2023)   Humiliation, Afraid, Rape, and Kick questionnaire    Fear of Current or Ex-Partner: No    Emotionally Abused: No    Physically Abused: No    Sexually Abused: No    Family History: Family History  Problem Relation Age of Onset   Hypertension Mother    Cancer Father        lung   Colon cancer Neg Hx    Inflammatory bowel disease Neg Hx    Pancreatic cancer Neg Hx     Current Medications:  Current Outpatient Medications:    abiraterone acetate (ZYTIGA) 250 MG tablet, Take 4 tablets (1,000 mg total) by mouth daily. Take on an empty  stomach 1 hour before or 2 hours after a meal, Disp: 120 tablet, Rfl: 3   albuterol (PROVENTIL HFA;VENTOLIN HFA) 108 (90 Base) MCG/ACT inhaler, Inhale 2 puffs into the lungs every 6 (six) hours as needed for wheezing or shortness of breath., Disp: , Rfl:    albuterol (PROVENTIL) (2.5 MG/3ML) 0.083% nebulizer solution, Take 2.5 mg by nebulization every 6 (six) hours as needed., Disp: , Rfl:    allopurinol (ZYLOPRIM) 100 MG tablet, Take 100 mg by mouth daily., Disp: , Rfl:    Ascorbic Acid (VITAMIN C) 1000 MG tablet, Take 1,000 mg by mouth daily., Disp: , Rfl:    aspirin EC 81 MG tablet, Take 81 mg by mouth daily. Swallow whole., Disp: , Rfl:    cholecalciferol (VITAMIN D3) 25 MCG (1000 UNIT) tablet, Take 1,000 Units by mouth daily., Disp: , Rfl:    colchicine 0.6 MG tablet, Take 1 tablet by mouth daily as needed., Disp: , Rfl:    diltiazem (CARDIZEM CD) 360 MG 24 hr capsule, Take by mouth daily., Disp: , Rfl:    losartan (COZAAR) 25 MG tablet, Take 1 tablet (25 mg total) by mouth daily., Disp: 90 tablet, Rfl: 0   magnesium oxide (MAG-OX) 400 (240 Mg) MG tablet, Take 1 tablet (400 mg total) by mouth daily., Disp: 30 tablet, Rfl: 3   megestrol (MEGACE) 40 MG/ML suspension, Take by mouth., Disp: , Rfl:    potassium chloride (MICRO-K) 10 MEQ CR capsule, TAKE 2 CAPSULES BY MOUTH DAILY., Disp: 7 capsule, Rfl: 0   predniSONE (DELTASONE) 5 MG tablet, Take 10 mg by mouth daily., Disp: , Rfl:    predniSONE (DELTASONE) 5 MG tablet, Take 1 tablet (5 mg total) by mouth daily with breakfast., Disp: 90 tablet, Rfl: 1   TRELEGY ELLIPTA 200-62.5-25 MCG/ACT AEPB, Inhale 1 puff into the lungs daily., Disp: , Rfl:    Zinc 50 MG TABS, Take 1  tablet by mouth daily., Disp: , Rfl:    Allergies: Allergies  Allergen Reactions   Tamsulosin Hives and Itching    REVIEW OF SYSTEMS:   Review of Systems  Constitutional:  Negative for chills, fatigue and fever.  HENT:   Negative for lump/mass, mouth sores, nosebleeds,  sore throat and trouble swallowing.   Eyes:  Negative for eye problems.  Respiratory:  Negative for cough and shortness of breath.   Cardiovascular:  Negative for chest pain, leg swelling and palpitations.  Gastrointestinal:  Negative for abdominal pain, constipation, diarrhea, nausea and vomiting.  Genitourinary:  Positive for frequency. Negative for bladder incontinence, difficulty urinating, dysuria, hematuria and nocturia.   Musculoskeletal:  Negative for arthralgias, back pain, flank pain, myalgias and neck pain.  Skin:  Negative for itching and rash.  Neurological:  Negative for dizziness, headaches and numbness.  Hematological:  Does not bruise/bleed easily.  Psychiatric/Behavioral:  Negative for depression, sleep disturbance and suicidal ideas. The patient is not nervous/anxious.   All other systems reviewed and are negative.    VITALS:   Blood pressure (!) 135/56, pulse 81, temperature 98.1 F (36.7 C), temperature source Oral, resp. rate 18, height 5' 10.5" (1.791 m), weight 143 lb 12.8 oz (65.2 kg), SpO2 99 %.  Wt Readings from Last 3 Encounters:  02/26/23 143 lb 12.8 oz (65.2 kg)  02/17/23 142 lb 3.2 oz (64.5 kg)  02/13/23 145 lb (65.8 kg)    Body mass index is 20.34 kg/m.  Performance status (ECOG): 1 - Symptomatic but completely ambulatory  PHYSICAL EXAM:   Physical Exam Vitals and nursing note reviewed. Exam conducted with a chaperone present.  Constitutional:      Appearance: Normal appearance.  Cardiovascular:     Rate and Rhythm: Normal rate and regular rhythm.     Pulses: Normal pulses.     Heart sounds: Normal heart sounds.  Pulmonary:     Effort: Pulmonary effort is normal.     Breath sounds: Normal breath sounds.  Abdominal:     Palpations: Abdomen is soft. There is no hepatomegaly, splenomegaly or mass.     Tenderness: There is no abdominal tenderness.  Musculoskeletal:     Right lower leg: No edema.     Left lower leg: No edema.  Lymphadenopathy:      Cervical: No cervical adenopathy.     Right cervical: No superficial, deep or posterior cervical adenopathy.    Left cervical: No superficial, deep or posterior cervical adenopathy.     Upper Body:     Right upper body: No supraclavicular or axillary adenopathy.     Left upper body: No supraclavicular or axillary adenopathy.  Neurological:     General: No focal deficit present.     Mental Status: He is alert and oriented to person, place, and time.  Psychiatric:        Mood and Affect: Mood normal.        Behavior: Behavior normal.     LABS:      Latest Ref Rng & Units 02/26/2023   12:55 PM 02/06/2023    9:51 AM  CBC  WBC 4.0 - 10.5 K/uL 9.2  9.6   Hemoglobin 13.0 - 17.0 g/dL 16.1  09.6   Hematocrit 39.0 - 52.0 % 39.0  38.0   Platelets 150 - 400 K/uL 363  269       Latest Ref Rng & Units 02/26/2023   12:55 PM 02/06/2023    9:51 AM 03/28/2022   11:31  AM  CMP  Glucose 70 - 99 mg/dL 696  295  284   BUN 8 - 23 mg/dL 25  15  16    Creatinine 0.61 - 1.24 mg/dL 1.32  4.40  1.02   Sodium 135 - 145 mmol/L 134  131  139   Potassium 3.5 - 5.1 mmol/L 4.7  4.2  4.8   Chloride 98 - 111 mmol/L 104  103  101   CO2 22 - 32 mmol/L 21  20  19    Calcium 8.9 - 10.3 mg/dL 72.5  9.8  36.6   Total Protein 6.5 - 8.1 g/dL 7.0  6.8    Total Bilirubin 0.3 - 1.2 mg/dL 0.7  0.4    Alkaline Phos 38 - 126 U/L 71  61    AST 15 - 41 U/L 17  15    ALT 0 - 44 U/L 18  12       No results found for: "CEA1", "CEA" / No results found for: "CEA1", "CEA" Lab Results  Component Value Date   PSA1 0.4 02/13/2023   No results found for: "YQI347" No results found for: "CAN125"  No results found for: "TOTALPROTELP", "ALBUMINELP", "A1GS", "A2GS", "BETS", "BETA2SER", "GAMS", "MSPIKE", "SPEI" No results found for: "TIBC", "FERRITIN", "IRONPCTSAT" No results found for: "LDH"   STUDIES:   No results found.

## 2023-02-26 ENCOUNTER — Inpatient Hospital Stay (HOSPITAL_BASED_OUTPATIENT_CLINIC_OR_DEPARTMENT_OTHER): Payer: Medicare HMO | Admitting: Hematology

## 2023-02-26 ENCOUNTER — Inpatient Hospital Stay: Payer: Medicare HMO

## 2023-02-26 VITALS — BP 135/56 | HR 81 | Temp 98.1°F | Resp 18 | Ht 70.5 in | Wt 143.8 lb

## 2023-02-26 DIAGNOSIS — C775 Secondary and unspecified malignant neoplasm of intrapelvic lymph nodes: Secondary | ICD-10-CM

## 2023-02-26 DIAGNOSIS — C772 Secondary and unspecified malignant neoplasm of intra-abdominal lymph nodes: Secondary | ICD-10-CM | POA: Diagnosis not present

## 2023-02-26 DIAGNOSIS — Z79899 Other long term (current) drug therapy: Secondary | ICD-10-CM | POA: Diagnosis not present

## 2023-02-26 DIAGNOSIS — F17210 Nicotine dependence, cigarettes, uncomplicated: Secondary | ICD-10-CM | POA: Diagnosis not present

## 2023-02-26 DIAGNOSIS — Z923 Personal history of irradiation: Secondary | ICD-10-CM | POA: Diagnosis not present

## 2023-02-26 DIAGNOSIS — C61 Malignant neoplasm of prostate: Secondary | ICD-10-CM

## 2023-02-26 DIAGNOSIS — Z7982 Long term (current) use of aspirin: Secondary | ICD-10-CM | POA: Diagnosis not present

## 2023-02-26 DIAGNOSIS — Z9079 Acquired absence of other genital organ(s): Secondary | ICD-10-CM | POA: Diagnosis not present

## 2023-02-26 LAB — CBC WITH DIFFERENTIAL/PLATELET
Abs Immature Granulocytes: 0.03 10*3/uL (ref 0.00–0.07)
Basophils Absolute: 0 10*3/uL (ref 0.0–0.1)
Basophils Relative: 0 %
Eosinophils Absolute: 0.1 10*3/uL (ref 0.0–0.5)
Eosinophils Relative: 1 %
HCT: 39 % (ref 39.0–52.0)
Hemoglobin: 12.9 g/dL — ABNORMAL LOW (ref 13.0–17.0)
Immature Granulocytes: 0 %
Lymphocytes Relative: 12 %
Lymphs Abs: 1.1 10*3/uL (ref 0.7–4.0)
MCH: 30.4 pg (ref 26.0–34.0)
MCHC: 33.1 g/dL (ref 30.0–36.0)
MCV: 92 fL (ref 80.0–100.0)
Monocytes Absolute: 0.6 10*3/uL (ref 0.1–1.0)
Monocytes Relative: 7 %
Neutro Abs: 7.3 10*3/uL (ref 1.7–7.7)
Neutrophils Relative %: 80 %
Platelets: 363 10*3/uL (ref 150–400)
RBC: 4.24 MIL/uL (ref 4.22–5.81)
RDW: 16.5 % — ABNORMAL HIGH (ref 11.5–15.5)
WBC: 9.2 10*3/uL (ref 4.0–10.5)
nRBC: 0 % (ref 0.0–0.2)

## 2023-02-26 LAB — COMPREHENSIVE METABOLIC PANEL
ALT: 18 U/L (ref 0–44)
AST: 17 U/L (ref 15–41)
Albumin: 3.8 g/dL (ref 3.5–5.0)
Alkaline Phosphatase: 71 U/L (ref 38–126)
Anion gap: 9 (ref 5–15)
BUN: 25 mg/dL — ABNORMAL HIGH (ref 8–23)
CO2: 21 mmol/L — ABNORMAL LOW (ref 22–32)
Calcium: 10.7 mg/dL — ABNORMAL HIGH (ref 8.9–10.3)
Chloride: 104 mmol/L (ref 98–111)
Creatinine, Ser: 1.05 mg/dL (ref 0.61–1.24)
GFR, Estimated: >60 mL/min (ref >60)
Glucose, Bld: 109 mg/dL — ABNORMAL HIGH (ref 70–99)
Potassium: 4.7 mmol/L (ref 3.5–5.1)
Sodium: 134 mmol/L — ABNORMAL LOW (ref 135–145)
Total Bilirubin: 0.7 mg/dL (ref 0.3–1.2)
Total Protein: 7 g/dL (ref 6.5–8.1)

## 2023-02-26 LAB — MAGNESIUM: Magnesium: 2 mg/dL (ref 1.7–2.4)

## 2023-02-26 LAB — GENETIC SCREENING ORDER

## 2023-02-26 NOTE — Patient Instructions (Addendum)
[facility]  Discharge Instructions  You were seen and examined today by Dr. Ellin Saba.  Dr. Ellin Saba discussed your most recent lab work which revealed that your calcium is high. Dr. Ellin Saba wants you to discontinue the calcium for now.  Dr. Ellin Saba is ordering dexa scan to see your bone density.  Follow-up as scheduled in 4 weeks.    Thank you for choosing [facility] to provide your oncology and hematology care.   To afford each patient quality time with our provider, please arrive at least 15 minutes before your scheduled appointment time. You may need to reschedule your appointment if you arrive late (10 or more minutes). Arriving late affects you and other patients whose appointments are after yours.  Also, if you miss three or more appointments without notifying the office, you may be dismissed from the clinic at the provider's discretion.    Again, thank you for choosing [facility].  Our hope is that these requests will decrease the amount of time that you wait before being seen by our physicians.   If you have a lab appointment with the Cancer Center - please note that after April 8th, all labs will be drawn in the cancer center.  You do not have to check in or register with the main entrance as you have in the past but will complete your check-in at the cancer center.            _____________________________________________________________  Should you have questions after your visit to [facility], please contact our office at [phone] and follow the prompts.  Our office hours are 8:00 a.m. to 4:30 p.m. Monday - Thursday and 8:00 a.m. to 2:30 p.m. Friday.  Please note that voicemails left after 4:00 p.m. may not be returned until the following business day.  We are closed weekends and all major holidays.  You do have access to a nurse 24-7, just call the main number to the clinic  [phone] and do not press any options, hold on the line and a nurse will answer the phone.    For prescription refill requests, have your pharmacy contact our office and allow 72 hours.    Masks are no longer required in the cancer centers. If you would like for your care team to wear a mask while they are taking care of you, please let them know. You may have one support person who is at least 76 years old accompany you for your appointments.

## 2023-02-27 ENCOUNTER — Other Ambulatory Visit: Payer: Medicare HMO

## 2023-03-04 ENCOUNTER — Other Ambulatory Visit (HOSPITAL_COMMUNITY): Payer: Medicare HMO

## 2023-03-06 ENCOUNTER — Ambulatory Visit: Payer: Medicare HMO | Admitting: Urology

## 2023-03-07 ENCOUNTER — Other Ambulatory Visit (HOSPITAL_COMMUNITY): Payer: Self-pay

## 2023-03-10 ENCOUNTER — Other Ambulatory Visit: Payer: Self-pay

## 2023-03-13 ENCOUNTER — Encounter: Payer: Self-pay | Admitting: Licensed Clinical Social Worker

## 2023-03-13 ENCOUNTER — Inpatient Hospital Stay: Payer: Medicare HMO | Attending: Hematology | Admitting: Licensed Clinical Social Worker

## 2023-03-13 DIAGNOSIS — E871 Hypo-osmolality and hyponatremia: Secondary | ICD-10-CM | POA: Insufficient documentation

## 2023-03-13 DIAGNOSIS — Z9079 Acquired absence of other genital organ(s): Secondary | ICD-10-CM | POA: Insufficient documentation

## 2023-03-13 DIAGNOSIS — C775 Secondary and unspecified malignant neoplasm of intrapelvic lymph nodes: Secondary | ICD-10-CM | POA: Insufficient documentation

## 2023-03-13 DIAGNOSIS — C61 Malignant neoplasm of prostate: Secondary | ICD-10-CM | POA: Insufficient documentation

## 2023-03-13 DIAGNOSIS — Z7982 Long term (current) use of aspirin: Secondary | ICD-10-CM | POA: Insufficient documentation

## 2023-03-13 DIAGNOSIS — Z79899 Other long term (current) drug therapy: Secondary | ICD-10-CM | POA: Insufficient documentation

## 2023-03-13 DIAGNOSIS — Z1379 Encounter for other screening for genetic and chromosomal anomalies: Secondary | ICD-10-CM | POA: Insufficient documentation

## 2023-03-13 DIAGNOSIS — M810 Age-related osteoporosis without current pathological fracture: Secondary | ICD-10-CM | POA: Insufficient documentation

## 2023-03-14 ENCOUNTER — Ambulatory Visit: Payer: Medicare HMO

## 2023-03-17 ENCOUNTER — Ambulatory Visit (INDEPENDENT_AMBULATORY_CARE_PROVIDER_SITE_OTHER): Payer: Medicare HMO

## 2023-03-17 ENCOUNTER — Ambulatory Visit (HOSPITAL_COMMUNITY)
Admission: RE | Admit: 2023-03-17 | Discharge: 2023-03-17 | Disposition: A | Discharge status: Home / Self Care | Payer: Medicare HMO | Source: Ambulatory Visit | Attending: Hematology | Admitting: Hematology

## 2023-03-17 DIAGNOSIS — C61 Malignant neoplasm of prostate: Secondary | ICD-10-CM | POA: Diagnosis not present

## 2023-03-17 DIAGNOSIS — M810 Age-related osteoporosis without current pathological fracture: Secondary | ICD-10-CM | POA: Insufficient documentation

## 2023-03-17 DIAGNOSIS — C775 Secondary and unspecified malignant neoplasm of intrapelvic lymph nodes: Secondary | ICD-10-CM | POA: Diagnosis not present

## 2023-03-17 MED ORDER — DEGARELIX ACETATE 80 MG ~~LOC~~ SOLR
80.0000 mg | Freq: Once | SUBCUTANEOUS | Status: AC
Start: 2023-03-17 — End: 2023-03-17
  Administered 2023-03-17: 80 mg via SUBCUTANEOUS

## 2023-03-17 NOTE — Progress Notes (Signed)
Firmagon Sub Q Injection  Due to Prostate Cancer patient is present today for a Firmagon Injection.   Medication: Deborra Medina (Degarelix)  Dose: 80mg  Location: right upper abdomen Lot: [id number] Exp: 05/04/2025  Patient tolerated well, no complications were noted  Performed by: Guss Bunde, CMA  Follow up: Follow up as scheduled.

## 2023-03-20 ENCOUNTER — Inpatient Hospital Stay: Payer: Medicare HMO

## 2023-03-20 DIAGNOSIS — C775 Secondary and unspecified malignant neoplasm of intrapelvic lymph nodes: Secondary | ICD-10-CM | POA: Diagnosis not present

## 2023-03-20 DIAGNOSIS — Z7982 Long term (current) use of aspirin: Secondary | ICD-10-CM | POA: Diagnosis not present

## 2023-03-20 DIAGNOSIS — M810 Age-related osteoporosis without current pathological fracture: Secondary | ICD-10-CM | POA: Diagnosis not present

## 2023-03-20 DIAGNOSIS — C61 Malignant neoplasm of prostate: Secondary | ICD-10-CM | POA: Diagnosis not present

## 2023-03-20 DIAGNOSIS — E871 Hypo-osmolality and hyponatremia: Secondary | ICD-10-CM | POA: Diagnosis not present

## 2023-03-20 DIAGNOSIS — Z79899 Other long term (current) drug therapy: Secondary | ICD-10-CM | POA: Diagnosis not present

## 2023-03-20 DIAGNOSIS — Z9079 Acquired absence of other genital organ(s): Secondary | ICD-10-CM | POA: Diagnosis not present

## 2023-03-20 LAB — CBC WITH DIFFERENTIAL/PLATELET
Abs Immature Granulocytes: 0.07 10*3/uL (ref 0.00–0.07)
Basophils Absolute: 0 10*3/uL (ref 0.0–0.1)
Basophils Relative: 0 %
Eosinophils Absolute: 0.1 10*3/uL (ref 0.0–0.5)
Eosinophils Relative: 1 %
HCT: 36.5 % — ABNORMAL LOW (ref 39.0–52.0)
Hemoglobin: 12 g/dL — ABNORMAL LOW (ref 13.0–17.0)
Immature Granulocytes: 1 %
Lymphocytes Relative: 8 %
Lymphs Abs: 0.8 10*3/uL (ref 0.7–4.0)
MCH: 30.2 pg (ref 26.0–34.0)
MCHC: 32.9 g/dL (ref 30.0–36.0)
MCV: 91.7 fL (ref 80.0–100.0)
Monocytes Absolute: 0.5 10*3/uL (ref 0.1–1.0)
Monocytes Relative: 5 %
Neutro Abs: 9.2 10*3/uL — ABNORMAL HIGH (ref 1.7–7.7)
Neutrophils Relative %: 85 %
Platelets: 331 10*3/uL (ref 150–400)
RBC: 3.98 MIL/uL — ABNORMAL LOW (ref 4.22–5.81)
RDW: 14.4 % (ref 11.5–15.5)
WBC: 10.7 10*3/uL — ABNORMAL HIGH (ref 4.0–10.5)
nRBC: 0 % (ref 0.0–0.2)

## 2023-03-20 LAB — COMPREHENSIVE METABOLIC PANEL
ALT: 15 U/L (ref 0–44)
AST: 16 U/L (ref 15–41)
Albumin: 3.7 g/dL (ref 3.5–5.0)
Alkaline Phosphatase: 72 U/L (ref 38–126)
Anion gap: 8 (ref 5–15)
BUN: 15 mg/dL (ref 8–23)
CO2: 22 mmol/L (ref 22–32)
Calcium: 10.2 mg/dL (ref 8.9–10.3)
Chloride: 99 mmol/L (ref 98–111)
Creatinine, Ser: 1.03 mg/dL (ref 0.61–1.24)
GFR, Estimated: >60 mL/min (ref >60)
Glucose, Bld: 118 mg/dL — ABNORMAL HIGH (ref 70–99)
Potassium: 4.4 mmol/L (ref 3.5–5.1)
Sodium: 129 mmol/L — ABNORMAL LOW (ref 135–145)
Total Bilirubin: 0.7 mg/dL (ref 0.3–1.2)
Total Protein: 7 g/dL (ref 6.5–8.1)

## 2023-03-20 LAB — PSA: Prostatic Specific Antigen: 0.06 ng/mL (ref 0.00–4.00)

## 2023-03-20 LAB — MAGNESIUM: Magnesium: 1.8 mg/dL (ref 1.7–2.4)

## 2023-03-26 DIAGNOSIS — M810 Age-related osteoporosis without current pathological fracture: Secondary | ICD-10-CM | POA: Insufficient documentation

## 2023-03-26 NOTE — Progress Notes (Incomplete)
[facility] [address], [place] [address]    Clinic Day:  03/27/2023  Referring physician: Richardean Chimera, MD  Patient Care Team: Richardean Chimera, MD as PCP - General (Family Medicine) Wyline Mood Dorothe Pea, MD as PCP - Cardiology (Cardiology) Jena Gauss Gerrit Friends, MD as Consulting Physician (Gastroenterology) Doreatha Massed, MD as Medical Oncologist (Medical Oncology) Therese Sarah, RN as Oncology Nurse Navigator (Medical Oncology)   ASSESSMENT & PLAN:   Assessment: 1.  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 Ileene Hutchinson on 09/22/2018 - Final pathology: PT3b N0, Gleason 4+3 prostate adenocarcinoma, postoperative PSA remained detectable and rose to 5.7 - Axumin PET (06/2019): Local recurrence - Salvage XRT to the prostate fossa and pelvic lymph nodes, Dr. Brendolyn Patty from 07/05/2019 - 08/31/2019, without concurrent ADT.  PSA responded well reaching a nadir of 0.1. - PSA rose again to 3.4 in 06/2022, PSMA PET scan: Oligometastatic disease involving single internal iliac lymph node. - 07/29/2022 - 08/09/2022: 10 fx course of UHRT to right internal iliac node, PSA only decreased slightly to 3.1 in 10/2022, further went up to 5 on 12/05/2022 - PSMA PET (12/19/2022): New radiotracer avid 2 mm infrarenal retrocaval lymph node and a minimal increase size of the previously treated right common iliac lymph node measuring 3 mm compared to 2 mm previously, no evidence of recurrence in the prostatectomy bed or bone mets. - Loading dose of Firmagon with Dr. Annabell Howells on 01/09/2023 - Abiraterone and prednisone started on 01/18/2023. - XRT to the retrocaval lymph node and right common iliac lymph node completed on 01/29/2023, 10 fractions   2.  Social/family history: - He lives with wife at home.  He is independent of all ADLs and IADLs.  Uses cane for longer distances.  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  Current active smoker, 1 pack/day for 45 years. - Father had lung cancer and was smoker.    Plan: 1.  Metastatic CSPC to abdominal/high pelvic lymph node (low-volume): - He is tolerating Zytiga and prednisone reasonably well. - Reviewed labs today: Normal LFTs and creatinine.  Mild hyponatremia with sodium 129.  CBC grossly normal with mild normocytic anemia. - PSA 0.06, improved from 0.4 on 02/13/2023 and 6.9 on 01/02/2023. - He will continue monthly degarelix with Dr. Annabell Howells. - Continue Zytiga 1000 mg daily along with prednisone 5 mg daily.  - RTC 2 months for follow-up with repeat labs.   2.  Hypomagnesemia: - Continue magnesium 1 tablet daily.  Magnesium is normal today.   3.  Mild hypercalcemia: - He was taking Caltrate 2 tablets/day which was started in March of this year.  At last visit I held his calcium pills. - Today calcium improved to 10.2 from 10.7. - Since we are starting him on Prolia, he may start back on calcium 1 tablet daily.  4.  Osteoporosis (DEXA on 03/17/2023 T score -4): - We reviewed results of DEXA scan which showed osteoporosis. - We discussed the treatment of osteoporosis with Prolia injections every 6 months.  We discussed side effects in detail including hypocalcemia and ONJ. - He is agreeable.  We will schedule his first injection after insurance authorization.    No orders of the defined types were placed in this encounter.     I,Katie Daubenspeck,acting as a Neurosurgeon for Doreatha Massed, MD.,have documented all relevant documentation on the  behalf of Doreatha Massed, MD,as directed by  Doreatha Massed, MD while in the presence of Doreatha Massed, MD.   I, Doreatha Massed MD, have reviewed the above documentation for accuracy and completeness, and I agree with the above.   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 RALP 09/22/18 (Dr. Ileene Hutchinson) 2. Salvage XRT + ADT 07/05/2019 - 08/31/2019 (Dr. Brendolyn Patty) 3. UHRT to right internal iliac node 07/29/22 - 08/09/22 4. UHRT to infrarenal retrocaval lymph node 01/16/23 - 01/29/23  Current Therapy:  Deborra Medina and Abiraterone    HISTORY OF PRESENT ILLNESS:   Oncology History   No history exists.     INTERVAL HISTORY:   Dan [name] is a 77 y.o. 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 He was last seen by me on 02/26/23.  Since his last visit, he underwent bone density scan on 03/17/23 showing a T-score of -4, which is considered osteoporotic.  Today, he states that he is doing well overall. His appetite level is at 90%. His energy level is at 40%.  PAST MEDICAL HISTORY:   Past Medical History: Past Medical History:  Diagnosis Date   BPH (benign prostatic hyperplasia)    COPD (chronic obstructive pulmonary disease) (HCC)    Depression    ED (erectile dysfunction)    Gout    Hyperlipidemia    Hypertension    Loss of hearing    Nocturia    Paroxysmal atrial fibrillation (HCC)    Prostate cancer (HCC)     Surgical History: Past Surgical History:  Procedure Laterality Date   CARDIOVASCULAR STRESS TEST  12/31/2004   normal perfuction nuclear study w/ no evidence ischemia /  normal LV function and wall motion , ef 52%   PROSTATECTOMY  2019   TOTAL HIP ARTHROPLASTY      Social History: Social History   Socioeconomic History   Marital status: Married    Spouse name: Not on file   Number of children: Not on file   Years of education: Not on file   Highest education level: Not on file  Occupational History    Not on file  Tobacco Use   Smoking status: Every Day    Types: Cigarettes    Start date: 06/12/1967    Passive exposure: Never   Smokeless tobacco: Never  Vaping Use   Vaping Use: Never used  Substance and Sexual Activity   Alcohol use: Not Currently   Drug use: Not Currently   Sexual activity: Not on file  Other Topics Concern   Not on file  Social History Narrative   Not on file   Social Determinants of Health   Financial Resource Strain: Not on file  Food Insecurity: No Food Insecurity (01/14/2023)   Hunger Vital Sign    Worried About Running Out of Food in the Last Year: Never true    Ran Out of Food in the Last Year: Never true  Transportation Needs: No Transportation Needs (01/14/2023)   PRAPARE - Administrator, Civil Service (Medical): No    Lack of Transportation (Non-Medical): No  Physical  Activity: Not on file  Stress: Not on file  Social Connections: Not on file  Intimate Partner Violence: Not At Risk (01/14/2023)   Humiliation, Afraid, Rape, and Kick questionnaire    Fear of Current or Ex-Partner: No    Emotionally Abused: No    Physically Abused: No    Sexually Abused: No    Family History: Family History  Problem Relation Age of Onset   Hypertension Mother    Cancer Father        lung   Colon cancer Neg Hx    Inflammatory bowel disease Neg Hx    Pancreatic cancer Neg Hx     Current Medications:  Current Outpatient Medications:    abiraterone acetate (ZYTIGA) 250 MG tablet, Take 4 tablets (1,000 mg total) by mouth daily. Take on an empty stomach 1 hour before or 2 hours after a meal, Disp: 120 tablet, Rfl: 3   albuterol (PROVENTIL HFA;VENTOLIN HFA) 108 (90 Base) MCG/ACT inhaler, Inhale 2 puffs into the lungs every 6 (six) hours as needed for wheezing or shortness of breath., Disp: , Rfl:    albuterol (PROVENTIL) (2.5 MG/3ML) 0.083% nebulizer solution, Take 2.5 mg by nebulization every 6 (six) hours as needed., Disp: , Rfl:    allopurinol  (ZYLOPRIM) 100 MG tablet, Take 100 mg by mouth daily., Disp: , Rfl:    Ascorbic Acid (VITAMIN C) 1000 MG tablet, Take 1,000 mg by mouth daily., Disp: , Rfl:    aspirin EC 81 MG tablet, Take 81 mg by mouth daily. Swallow whole., Disp: , Rfl:    cholecalciferol (VITAMIN D3) 25 MCG (1000 UNIT) tablet, Take 1,000 Units by mouth daily., Disp: , Rfl:    colchicine 0.6 MG tablet, Take 1 tablet by mouth daily as needed., Disp: , Rfl:    diltiazem (CARDIZEM CD) 360 MG 24 hr capsule, Take by mouth daily., Disp: , Rfl:    losartan (COZAAR) 25 MG tablet, Take 1 tablet (25 mg total) by mouth daily., Disp: 90 tablet, Rfl: 0   magnesium oxide (MAG-OX) 400 (240 Mg) MG tablet, Take 1 tablet (400 mg total) by mouth daily., Disp: 30 tablet, Rfl: 3   megestrol (MEGACE) 40 MG/ML suspension, Take by mouth., Disp: , Rfl:    potassium chloride (MICRO-K) 10 MEQ CR capsule, TAKE 2 CAPSULES BY MOUTH DAILY., Disp: 7 capsule, Rfl: 0   predniSONE (DELTASONE) 5 MG tablet, Take 10 mg by mouth daily., Disp: , Rfl:    predniSONE (DELTASONE) 5 MG tablet, Take 1 tablet (5 mg total) by mouth daily with breakfast., Disp: 90 tablet, Rfl: 1   TRELEGY ELLIPTA 200-62.5-25 MCG/ACT AEPB, Inhale 1 puff into the lungs daily., Disp: , Rfl:    Zinc 50 MG TABS, Take 1 tablet by mouth daily., Disp: , Rfl:    Allergies: Allergies  Allergen Reactions   Tamsulosin Hives and Itching    REVIEW OF SYSTEMS:   Review of Systems  Constitutional:  Negative for chills, fatigue and fever.  HENT:   Negative for lump/mass, mouth sores, nosebleeds, sore throat and trouble swallowing.   Eyes:  Negative for eye problems.  Respiratory:  Negative for cough and shortness of breath.   Cardiovascular:  Negative for chest pain, leg swelling and palpitations.  Gastrointestinal:  Positive for constipation. Negative for abdominal pain, diarrhea, nausea and vomiting.  Genitourinary:  Positive for bladder incontinence. Negative for difficulty urinating, dysuria,  frequency, hematuria and nocturia.   Musculoskeletal:  Negative for arthralgias, back pain, flank pain, myalgias and  neck pain.  Skin:  Negative for itching and rash.  Neurological:  Negative for dizziness, headaches and numbness.  Hematological:  Does not bruise/bleed easily.  Psychiatric/Behavioral:  Positive for sleep disturbance. Negative for depression and suicidal ideas. The patient is not nervous/anxious.   All other systems reviewed and are negative.    VITALS:   Blood pressure (!) 152/60, pulse 79, temperature 97.8 F (36.6 C), temperature source Oral, resp. rate 18, height 5' 10.5" (1.791 m), weight 141 lb 6.4 oz (64.1 kg), SpO2 99 %.  Wt Readings from Last 3 Encounters:  03/27/23 141 lb 6.4 oz (64.1 kg)  02/26/23 143 lb 12.8 oz (65.2 kg)  02/17/23 142 lb 3.2 oz (64.5 kg)    Body mass index is 20 kg/m.  Performance status (ECOG): 1 - Symptomatic but completely ambulatory  PHYSICAL EXAM:   Physical Exam Vitals and nursing note reviewed. Exam conducted with a chaperone present.  Constitutional:      Appearance: Normal appearance.  Cardiovascular:     Rate and Rhythm: Normal rate and regular rhythm.     Pulses: Normal pulses.     Heart sounds: Normal heart sounds.  Pulmonary:     Effort: Pulmonary effort is normal.     Breath sounds: Normal breath sounds.  Abdominal:     Palpations: Abdomen is soft. There is no hepatomegaly, splenomegaly or mass.     Tenderness: There is no abdominal tenderness.  Musculoskeletal:     Right lower leg: No edema.     Left lower leg: No edema.  Lymphadenopathy:     Cervical: No cervical adenopathy.     Right cervical: No superficial, deep or posterior cervical adenopathy.    Left cervical: No superficial, deep or posterior cervical adenopathy.     Upper Body:     Right upper body: No supraclavicular or axillary adenopathy.     Left upper body: No supraclavicular or axillary adenopathy.  Neurological:     General: No focal deficit  present.     Mental Status: He is alert and oriented to person, place, and time.  Psychiatric:        Mood and Affect: Mood normal.        Behavior: Behavior normal.     LABS:      Latest Ref Rng & Units 03/20/2023   10:16 AM 02/26/2023   12:55 PM 02/06/2023    9:51 AM  CBC  WBC 4.0 - 10.5 K/uL 10.7  9.2  9.6   Hemoglobin 13.0 - 17.0 g/dL 16.1  09.6  04.5   Hematocrit 39.0 - 52.0 % 36.5  39.0  38.0   Platelets 150 - 400 K/uL 331  363  269       Latest Ref Rng & Units 03/20/2023   10:16 AM 02/26/2023   12:55 PM 02/06/2023    9:51 AM  CMP  Glucose 70 - 99 mg/dL 409  811  914   BUN 8 - 23 mg/dL 15  25  15    Creatinine 0.61 - 1.24 mg/dL 7.82  9.56  2.13   Sodium 135 - 145 mmol/L 129  134  131   Potassium 3.5 - 5.1 mmol/L 4.4  4.7  4.2   Chloride 98 - 111 mmol/L 99  104  103   CO2 22 - 32 mmol/L 22  21  20    Calcium 8.9 - 10.3 mg/dL 08.6  57.8  9.8   Total Protein 6.5 - 8.1 g/dL 7.0  7.0  6.8  Total Bilirubin 0.3 - 1.2 mg/dL 0.7  0.7  0.4   Alkaline Phos 38 - 126 U/L 72  71  61   AST 15 - 41 U/L 16  17  15    ALT 0 - 44 U/L 15  18  12       No results found for: "CEA1", "CEA" / No results found for: "CEA1", "CEA" Lab Results  Component Value Date   PSA1 0.4 02/13/2023   No results found for: "UJW119" No results found for: "CAN125"  No results found for: "TOTALPROTELP", "ALBUMINELP", "A1GS", "A2GS", "BETS", "BETA2SER", "GAMS", "MSPIKE", "SPEI" No results found for: "TIBC", "FERRITIN", "IRONPCTSAT" No results found for: "LDH"   STUDIES:   DG Bone Density  Result Date: 03/17/2023 EXAM: DUAL X-RAY ABSORPTIOMETRY (DXA) FOR BONE MINERAL DENSITY IMPRESSION: Your patient Abasi Gaida completed a BMD test on 03/17/2023 using the [employer] DXA System (software version: 14.10) manufactured by [employer]. The following summarizes the results of our evaluation. Technologist: [facility] PATIENT BIOGRAPHICAL: Name: Amarrion, Donmoyer Patient ID: [id number] Birth Date: 23-Feb-1946  Height: 70.5 in. Gender: Male Exam Date: 03/17/2023 Weight: 143.8 lbs. Indications: Caucasian, Chronic Steroid Use, History of Fracture (Adult), Prostate Cancer, Tobacco User Fractures: Hip Treatments: Asprin, Calcium, Vitamin D DENSITOMETRY RESULTS: Site         Region     Measured Date Measured Age WHO Classification Young Adult T-score BMD         %Change vs. Previous Significant Change (*) AP Spine L1-L3 03/17/2023 75.7 N/A -1.9 0.981 g/cm2 - - Right Femur Neck 03/17/2023 75.7 N/A -4.0 0.552 g/cm2 - - Right Femur Total 03/17/2023 75.7 N/A -3.8 0.560 g/cm2 - - Left Forearm Radius 33% 03/17/2023 75.7 N/A -3.7 0.508 g/cm2 - - ASSESSMENT: BMD as determined from Femur Neck is 0.552 g/cm2 with a T-score of -4.0. This patient is considered osteoporotic by World [employer]al[employer] [facility]) Criteria. The scan quality is good. L4 was excluded due to advanced degenerative changes. Lt. hip was excluded due to surgical repair. World [employer] [facility]) criteria for post-menopausal, Caucasian Women: Normal:       T-score at or above -1 SD Osteopenia:   T-score between -1 and -2.5 SD Osteoporosis: T-score at or below -2.5 SD RECOMMENDATIONS: 1. All patients should optimize calcium and vitamin D intake. 2. Consider FDA-approved medical therapies in postmenopausal women and med aged 53 years and older, based on the following: a. A hip or vertebral (clinical or morphometric) fracture b. T-score< -2.5 at the femoral neck or spine after appropriate evaluation to exclude secondary causes c. Low bone mass (T-score between -1.0 and -2.5 at the femoral neck or spine) and a 10-year probability of a hip fracture > 3% or a 10-year probability of a major osteoporosis-related fracture > 20% based on the [place]-adapted WHO algorithm d. Clinician judgment and/or patient preferences may indicate treatment for people with 10-year fracture probabilities above or below these levels FOLLOW-UP: People with diagnosed cases of osteoporosis or  osteopenia should be regularly tested for bone mineral density. For patients eligible for Medicare, routine testing is allowed once every 2 years. Testing frequency can be increased for patients who have rapidly progressing disease, or for those who are receiving medical therapy to restore bone mass. I have reviewed this report, and agree with the above findings. [facility] Electronically Signed   By: Frederico Hamman M.D.   On: 03/17/2023 11:06

## 2023-03-27 ENCOUNTER — Inpatient Hospital Stay: Payer: Medicare HMO | Admitting: Hematology

## 2023-03-27 VITALS — BP 152/60 | HR 79 | Temp 97.8°F | Resp 18 | Ht 70.5 in | Wt 141.4 lb

## 2023-03-27 DIAGNOSIS — Z9079 Acquired absence of other genital organ(s): Secondary | ICD-10-CM | POA: Diagnosis not present

## 2023-03-27 DIAGNOSIS — Z79899 Other long term (current) drug therapy: Secondary | ICD-10-CM | POA: Diagnosis not present

## 2023-03-27 DIAGNOSIS — Z7982 Long term (current) use of aspirin: Secondary | ICD-10-CM | POA: Diagnosis not present

## 2023-03-27 DIAGNOSIS — M810 Age-related osteoporosis without current pathological fracture: Secondary | ICD-10-CM | POA: Diagnosis not present

## 2023-03-27 DIAGNOSIS — C775 Secondary and unspecified malignant neoplasm of intrapelvic lymph nodes: Secondary | ICD-10-CM | POA: Diagnosis not present

## 2023-03-27 DIAGNOSIS — C61 Malignant neoplasm of prostate: Secondary | ICD-10-CM | POA: Diagnosis not present

## 2023-03-27 DIAGNOSIS — E871 Hypo-osmolality and hyponatremia: Secondary | ICD-10-CM | POA: Diagnosis not present

## 2023-03-27 NOTE — Patient Instructions (Signed)
[facility] Discharge Instructions   You were seen and examined today by Dr. Ellin Saba.  He reviewed the results of your bone density test which shows you have osteoporosis. You should start Os-Cal D one pill a day. We will also start you on a bone strengthening shot called Prolia. This is given once every 6 months.   He reviewed the results of your lab work which are normal/stable.   Return as scheduled.    Thank you for choosing  Cancer Center at [facility] to provide your oncology and hematology care.  To afford each patient quality time with our provider, please arrive at least 15 minutes before your scheduled appointment time.   If you have a lab appointment with the Cancer Center please come in thru the Main Entrance and check in at the main information desk.  You need to re-schedule your appointment should you arrive 10 or more minutes late.  We strive to give you quality time with our providers, and arriving late affects you and other patients whose appointments are after yours.  Also, if you no show three or more times for appointments you may be dismissed from the clinic at the providers discretion.     Again, thank you for choosing [facility].  Our hope is that these requests will decrease the amount of time that you wait before being seen by our physicians.       _____________________________________________________________  Should you have questions after your visit to [facility], please contact our office at [phone] and follow the prompts.  Our office hours are 8:00 a.m. and 4:30 p.m. Monday - Friday.  Please note that voicemails left after 4:00 p.m. may not be returned until the following business day.  We are closed weekends and major holidays.  You do have access to a nurse 24-7, just call the main number to the clinic [phone] and do not press any options, hold on the line and a  nurse will answer the phone.    For prescription refill requests, have your pharmacy contact our office and allow 72 hours.    Due to Covid, you will need to wear a mask upon entering the hospital. If you do not have a mask, a mask will be given to you at the Main Entrance upon arrival. For doctor visits, patients may have 1 support person age 16 or older with them. For treatment visits, patients can not have anyone with them due to social distancing guidelines and our immunocompromised population.

## 2023-03-27 NOTE — Progress Notes (Signed)
Patient is taking Zytiga as prescribed.  He has not missed any doses and reports no side effects at this time.   

## 2023-03-28 ENCOUNTER — Other Ambulatory Visit (HOSPITAL_COMMUNITY): Payer: Self-pay

## 2023-04-03 ENCOUNTER — Inpatient Hospital Stay: Payer: Medicare HMO

## 2023-04-03 VITALS — BP 149/67 | HR 76 | Temp 97.7°F | Resp 17

## 2023-04-03 DIAGNOSIS — M810 Age-related osteoporosis without current pathological fracture: Secondary | ICD-10-CM | POA: Diagnosis not present

## 2023-04-03 DIAGNOSIS — M150 Primary generalized (osteo)arthritis: Secondary | ICD-10-CM

## 2023-04-03 DIAGNOSIS — Z79899 Other long term (current) drug therapy: Secondary | ICD-10-CM | POA: Diagnosis not present

## 2023-04-03 DIAGNOSIS — Z9079 Acquired absence of other genital organ(s): Secondary | ICD-10-CM | POA: Diagnosis not present

## 2023-04-03 DIAGNOSIS — M159 Polyosteoarthritis, unspecified: Secondary | ICD-10-CM

## 2023-04-03 DIAGNOSIS — Z7982 Long term (current) use of aspirin: Secondary | ICD-10-CM | POA: Diagnosis not present

## 2023-04-03 DIAGNOSIS — C61 Malignant neoplasm of prostate: Secondary | ICD-10-CM | POA: Diagnosis not present

## 2023-04-03 DIAGNOSIS — E871 Hypo-osmolality and hyponatremia: Secondary | ICD-10-CM | POA: Diagnosis not present

## 2023-04-03 DIAGNOSIS — C775 Secondary and unspecified malignant neoplasm of intrapelvic lymph nodes: Secondary | ICD-10-CM | POA: Diagnosis not present

## 2023-04-03 MED ORDER — DENOSUMAB 60 MG/ML ~~LOC~~ SOSY
60.0000 mg | PREFILLED_SYRINGE | Freq: Once | SUBCUTANEOUS | Status: AC
  Administered 2023-04-03: 60 mg via SUBCUTANEOUS
  Filled 2023-04-03: qty 1

## 2023-04-03 NOTE — Patient Instructions (Signed)
[facility]  Discharge Instructions: Thank you for choosing [facility] to provide your oncology and hematology care.  If you have a lab appointment with the Cancer Center - please note that after April 8th, 2024, all labs will be drawn in the cancer center.  You do not have to check in or register with the main entrance as you have in the past but will complete your check-in in the cancer center.  Wear comfortable clothing and clothing appropriate for easy access to any Portacath or PICC line.   We strive to give you quality time with your provider. You may need to reschedule your appointment if you arrive late (15 or more minutes).  Arriving late affects you and other patients whose appointments are after yours.  Also, if you miss three or more appointments without notifying the office, you may be dismissed from the clinic at the provider's discretion.      For prescription refill requests, have your pharmacy contact our office and allow 72 hours for refills to be completed.    Today you received the following Prolia, return as scheduled.   To help prevent nausea and vomiting after your treatment, we encourage you to take your nausea medication as directed.  BELOW ARE SYMPTOMS THAT SHOULD BE REPORTED IMMEDIATELY: *FEVER GREATER THAN 100.4 F (38 C) OR HIGHER *CHILLS OR SWEATING *NAUSEA AND VOMITING THAT IS NOT CONTROLLED WITH YOUR NAUSEA MEDICATION *UNUSUAL SHORTNESS OF BREATH *UNUSUAL BRUISING OR BLEEDING *URINARY PROBLEMS (pain or burning when urinating, or frequent urination) *BOWEL PROBLEMS (unusual diarrhea, constipation, pain near the anus) TENDERNESS IN MOUTH AND THROAT WITH OR WITHOUT PRESENCE OF ULCERS (sore throat, sores in mouth, or a toothache) UNUSUAL RASH, SWELLING OR PAIN  UNUSUAL VAGINAL DISCHARGE OR ITCHING   Items with * indicate a potential emergency and should be followed up as soon as possible or go to the Emergency Department if  any problems should occur.  Please show the CHEMOTHERAPY ALERT CARD or IMMUNOTHERAPY ALERT CARD at check-in to the Emergency Department and triage nurse.  Should you have questions after your visit or need to cancel or reschedule your appointment, please contact [facility] [phone]  and follow the prompts.  Office hours are 8:00 a.m. to 4:30 p.m. Monday - Friday. Please note that voicemails left after 4:00 p.m. may not be returned until the following business day.  We are closed weekends and major holidays. You have access to a nurse at all times for urgent questions. Please call the main number to the clinic [phone] and follow the prompts.  For any non-urgent questions, you may also contact your provider using MyChart. We now offer e-Visits for anyone 18 and older to request care online for non-urgent symptoms. For details visit [email or web address].   Also download the MyChart app! Go to the app store, search "MyChart", open the app, select [place], and log in with your MyChart username and password.   

## 2023-04-03 NOTE — Progress Notes (Signed)
Patient taking calcium as directed. Denied tooth, jaw, and leg pain. No recent or upcoming dental visits. Labs reviewed. Patient tolerated injection with no complaints voiced. See MAR for details. Patient stable during and after injection. Site clean and dry with no bruising or swelling noted. Band aid applied. Vss with discharge and left in satisfactory condition with no s/s of distress.   

## 2023-04-04 ENCOUNTER — Other Ambulatory Visit (HOSPITAL_COMMUNITY): Payer: Self-pay

## 2023-04-07 ENCOUNTER — Other Ambulatory Visit (HOSPITAL_COMMUNITY): Payer: Self-pay

## 2023-04-15 ENCOUNTER — Other Ambulatory Visit (HOSPITAL_COMMUNITY): Payer: Self-pay

## 2023-04-15 ENCOUNTER — Ambulatory Visit (INDEPENDENT_AMBULATORY_CARE_PROVIDER_SITE_OTHER): Payer: Medicare HMO

## 2023-04-15 DIAGNOSIS — C61 Malignant neoplasm of prostate: Secondary | ICD-10-CM | POA: Diagnosis not present

## 2023-04-15 MED ORDER — DEGARELIX ACETATE 80 MG ~~LOC~~ SOLR
80.0000 mg | Freq: Once | SUBCUTANEOUS | Status: AC
Start: 2023-04-15 — End: 2023-04-15
  Administered 2023-04-15: 80 mg via SUBCUTANEOUS

## 2023-04-15 MED ORDER — DEGARELIX ACETATE 80 MG ~~LOC~~ SOLR
80.0000 mg | Freq: Once | SUBCUTANEOUS | Status: DC
Start: 2023-04-15 — End: 2023-04-15

## 2023-04-15 NOTE — Progress Notes (Addendum)
Firmagon Sub Q Injection  Due to Prostate Cancer patient is present today for a Firmagon Injection.   Medication: Deborra Medina (Degarelix)  Dose: 80 mg Location: left upper abdomen Lot: [id number] Exp: 10/2025  Patient tolerated well, no complications were noted  Performed by: [place] Pink RMA  Follow up: in 1 mth with Dr. Annabell Howells as scheduled

## 2023-04-16 ENCOUNTER — Other Ambulatory Visit (HOSPITAL_COMMUNITY): Payer: Self-pay

## 2023-04-17 ENCOUNTER — Other Ambulatory Visit (HOSPITAL_COMMUNITY): Payer: Self-pay

## 2023-04-28 ENCOUNTER — Ambulatory Visit: Payer: Medicare HMO | Admitting: Hematology

## 2023-04-28 ENCOUNTER — Other Ambulatory Visit: Payer: Medicare HMO

## 2023-04-28 ENCOUNTER — Other Ambulatory Visit: Payer: Self-pay

## 2023-04-30 ENCOUNTER — Other Ambulatory Visit: Payer: Self-pay | Admitting: *Deleted

## 2023-04-30 ENCOUNTER — Other Ambulatory Visit (HOSPITAL_COMMUNITY): Payer: Self-pay

## 2023-04-30 ENCOUNTER — Other Ambulatory Visit: Payer: Self-pay | Admitting: Hematology

## 2023-04-30 ENCOUNTER — Other Ambulatory Visit: Payer: Self-pay

## 2023-04-30 MED ORDER — ABIRATERONE ACETATE 250 MG PO TABS
1000.0000 mg | ORAL_TABLET | Freq: Every day | ORAL | 3 refills | Status: DC
  Filled 2023-04-30: qty 120, 30d supply, fill #0
  Filled 2023-06-02: qty 120, 30d supply, fill #1
  Filled 2023-09-01 (×2): qty 120, 30d supply, fill #2
  Filled 2023-10-30: qty 120, 30d supply, fill #3

## 2023-04-30 NOTE — Telephone Encounter (Signed)
Zytiga refill approved.  Patient is tolerating and is to continue therapy. 

## 2023-05-05 ENCOUNTER — Other Ambulatory Visit (HOSPITAL_COMMUNITY): Payer: Self-pay

## 2023-05-09 ENCOUNTER — Other Ambulatory Visit: Payer: Self-pay

## 2023-05-10 ENCOUNTER — Other Ambulatory Visit: Payer: Self-pay | Admitting: Cardiology

## 2023-05-15 ENCOUNTER — Other Ambulatory Visit: Payer: Medicare HMO

## 2023-05-15 ENCOUNTER — Other Ambulatory Visit: Payer: Self-pay

## 2023-05-15 DIAGNOSIS — R9721 Rising PSA following treatment for malignant neoplasm of prostate: Secondary | ICD-10-CM

## 2023-05-16 LAB — TESTOSTERONE: Testosterone: <3 ng/dL — ABNORMAL LOW (ref 264–916)

## 2023-05-16 LAB — PSA: Prostate Specific Ag, Serum: <0.1 ng/mL (ref 0.0–4.0)

## 2023-05-22 ENCOUNTER — Encounter: Payer: Self-pay | Admitting: Urology

## 2023-05-22 ENCOUNTER — Other Ambulatory Visit: Payer: Self-pay

## 2023-05-22 ENCOUNTER — Ambulatory Visit: Payer: Medicare HMO | Admitting: Urology

## 2023-05-22 VITALS — BP 132/72 | HR 83 | Ht 70.5 in | Wt 141.1 lb

## 2023-05-22 DIAGNOSIS — C61 Malignant neoplasm of prostate: Secondary | ICD-10-CM

## 2023-05-22 DIAGNOSIS — C775 Secondary and unspecified malignant neoplasm of intrapelvic lymph nodes: Secondary | ICD-10-CM | POA: Diagnosis not present

## 2023-05-22 DIAGNOSIS — C772 Secondary and unspecified malignant neoplasm of intra-abdominal lymph nodes: Secondary | ICD-10-CM

## 2023-05-22 LAB — URINALYSIS, ROUTINE W REFLEX MICROSCOPIC
Bilirubin, UA: NEGATIVE
Glucose, UA: NEGATIVE
Ketones, UA: NEGATIVE
Leukocytes,UA: NEGATIVE
Nitrite, UA: NEGATIVE
Protein,UA: NEGATIVE
RBC, UA: NEGATIVE
Specific Gravity, UA: 1.015 (ref 1.005–1.030)
Urobilinogen, Ur: 1 mg/dL (ref 0.2–1.0)
pH, UA: 7 (ref 5.0–7.5)

## 2023-05-22 MED ORDER — DEGARELIX ACETATE 80 MG ~~LOC~~ SOLR
80.0000 mg | Freq: Once | SUBCUTANEOUS | Status: DC
Start: 2023-05-22 — End: 2023-05-22

## 2023-05-22 NOTE — Progress Notes (Signed)
Subjective: 1. Prostate cancer (HCC)   2. Prostate cancer metastatic to intraabdominal lymph node (HCC)   3. Prostate cancer metastatic to intrapelvic lymph node (HCC)     05/22/23: Dan [name] returns today in f/u.  He is on firmagon and abi/pred for his mCSPC with prior RP, salvage RTx to the prostate bed and 2 rounds of SBRT for nodal recurrences with the last in 3/24. His PSA is <0.1 and his T is <3 on therapy on 05/15/23 .  He has a history of hematuria with a positive cytology with a negative CT and cystocopy in 6/23.  Cytology subsequently normalized.   He has fatigue and SOB.  He has no weight loss or bone pain.  His IPSS is 11 with nocturia x 4.  He reports intermittency and some incontinence.    02/13/23: Dan [name] returns today in f/u. His PSA was 6.9 on 01/02/23.  He got his first firmagon shot on 01/09/23 and he had SBRT for the recurrent nodes in late March.  He saw Dr. Ellin Saba on 01/14/23 and has been started on Abiraterone/pred to intensify his ADT.  He has some hot flashes but no fatigue.  He has no GI complaints.   He had a site reaction with the initial firmagon but it has resolved.   01/02/23: Dan [name] returns today in f/u.  His PSA was up to 5.0 on 12/05/22.  A repeat PET showed uptake in a 2mm retrocaval node and residual activity in the treated ext iliac node.  He has seen Dr. Kathrynn Running and is planning possible additional SBRT to the new node but he is reluctant to retreat the iliac node.   He has no weight loss or bone pain.   12/05/22: Dan [name] returns today in f/u.  He had  a PSMA PET in 8/23 and had a single area of uptake in a right common iliac node.  He had SBRT to the node in 10/23.  His prior treatment had been an RALP followed by salvage RT as noted below.   We don't have a PSA prior to this visit.  His PSA was 3.4 prior to the radiation and 3.1 in December.  He has no pain or weight loss.  He is voiding ok and his UA is clear. He has minimal leakage after voiding.  There was B2 right renal cyst  with rim calcification on the PET.   06/06/22: Dan [name] returns today in f/u.  His repeat cytology on 04/04/22 was negative.  He was supposed to have a PSMA PET prior to this visit but that wasn't done.  He has no bone pain and he has gained some weight.  He has had no further hematuria.    04/04/22: Dan [name] returns today in f/u for his history of gross hematuria and prostate cancer with a rising PSA post prostatectomy.   The CT hematuria study showed possible bladder wall thickening and on  my review there was a possible lesion at the left bladder base.  He had no evidence of prostate cancer mets on CT but there is uptake in a left posterior rib on bone scan.  He does report prior rib fractures.  He has some right shoulder uptake as well that is probably arthritis.  He has had no more hematuria.    03/28/22: Dan [name] is a 77 yo male with a history of prostate cancer that was bulky on the left with Gleason 8 disease on biospy for a PSA of 12.8.  Prostate volume was 28ml.  He  was treated with RALP with Dr. Ileene Hutchinson in 2019 and was found to have T3b N0 M0 Gleason 7(4+3) prostate cancer on surgical path with a nadir PSA post op of 2.7 and a subsequent rise to 5.7 by 05/12/19 and an Axumin PET that only showed prostate bed uptake.  He then had radiation therapy by Dr. Brendolyn Patty for a PSA recurrence in 2020.  He is referred now with hematuria and a cytology that showed cells suspicious for high grade malignancy.  He has a smoker.  His Cr was 1.14 on 02/01/22 and his PSA was 2.2.  It had gotten down to 0.1 after the radiation therapy.  He only had one episode of hematuria the day he saw Dr. Reuel Boom.  His UA is clear today.   He is on only an aspirin for his a fib. He has only rare incontinence.  He has post prostatectomy ED.     ROS:  Review of Systems  Constitutional:  Positive for malaise/fatigue.  Respiratory:  Positive for shortness of breath.   Musculoskeletal:  Positive for joint pain (right shoulder).  All other systems  reviewed and are negative.   Allergies  Allergen Reactions   Tamsulosin Hives and Itching    Past Medical History:  Diagnosis Date   BPH (benign prostatic hyperplasia)    COPD (chronic obstructive pulmonary disease) (HCC)    Depression    ED (erectile dysfunction)    Gout    Hyperlipidemia    Hypertension    Loss of hearing    Nocturia    Paroxysmal atrial fibrillation (HCC)    Prostate cancer [facility])     Past Surgical History:  Procedure Laterality Date   CARDIOVASCULAR STRESS TEST  12/31/2004   normal perfuction nuclear study w/ no evidence ischemia /  normal LV function and wall motion , ef 52%   PROSTATECTOMY  2019   TOTAL HIP ARTHROPLASTY      Social History   Socioeconomic History   Marital status: Married    Spouse name: Not on file   Number of children: Not on file   Years of education: Not on file   Highest education level: Not on file  Occupational History   Not on file  Tobacco Use   Smoking status: Every Day    Types: Cigarettes    Start date: 06/12/1967    Passive exposure: Never   Smokeless tobacco: Never  Vaping Use   Vaping status: Never Used  Substance and Sexual Activity   Alcohol use: Not Currently   Drug use: Not Currently   Sexual activity: Not on file  Other Topics Concern   Not on file  Social History Narrative   Not on file   Social Determinants of Health   Financial Resource Strain: Not on file  Food Insecurity: No Food Insecurity (01/14/2023)   Hunger Vital Sign    Worried About Running Out of Food in the Last Year: Never true    Ran Out of Food in the Last Year: Never true  Transportation Needs: No Transportation Needs (01/14/2023)   PRAPARE - Administrator, Civil Service (Medical): No    Lack of Transportation (Non-Medical): No  Physical Activity: Not on file  Stress: Not on file  Social Connections: Unknown (03/18/2022)   Received from [facility]   Social Network    Social Network: Not on file  Intimate  Partner Violence: Not At Risk (01/14/2023)   Humiliation, Afraid, Rape, and Kick questionnaire  Fear of Current or Ex-Partner: No    Emotionally Abused: No    Physically Abused: No    Sexually Abused: No    Family History  Problem Relation Age of Onset   Hypertension Mother    Cancer Father        lung   Colon cancer Neg Hx    Inflammatory bowel disease Neg Hx    Pancreatic cancer Neg Hx     Anti-infectives: Anti-infectives (From admission, onward)    None       Current Outpatient Medications  Medication Sig Dispense Refill   abiraterone acetate (ZYTIGA) 250 MG tablet Take 4 tablets (1,000 mg total) by mouth daily. Take on an empty stomach 1 hour before or 2 hours after a meal 120 tablet 3   albuterol (PROVENTIL HFA;VENTOLIN HFA) 108 (90 Base) MCG/ACT inhaler Inhale 2 puffs into the lungs every 6 (six) hours as needed for wheezing or shortness of breath.     albuterol (PROVENTIL) (2.5 MG/3ML) 0.083% nebulizer solution Take 2.5 mg by nebulization every 6 (six) hours as needed.     allopurinol (ZYLOPRIM) 100 MG tablet Take 100 mg by mouth daily.     Ascorbic Acid (VITAMIN C) 1000 MG tablet Take 1,000 mg by mouth daily.     aspirin EC 81 MG tablet Take 81 mg by mouth daily. Swallow whole.     cholecalciferol (VITAMIN D3) 25 MCG (1000 UNIT) tablet Take 1,000 Units by mouth daily.     colchicine 0.6 MG tablet Take 1 tablet by mouth daily as needed.     diltiazem (CARDIZEM CD) 360 MG 24 hr capsule Take by mouth daily.     losartan (COZAAR) 25 MG tablet TAKE 1 TABLET (25 MG TOTAL) BY MOUTH DAILY. 90 tablet 2   magnesium oxide (MAG-OX) 400 (240 Mg) MG tablet Take 1 tablet (400 mg total) by mouth daily. 30 tablet 3   megestrol (MEGACE) 40 MG/ML suspension Take by mouth.     potassium chloride (MICRO-K) 10 MEQ CR capsule TAKE 2 CAPSULES BY MOUTH DAILY. 7 capsule 0   predniSONE (DELTASONE) 5 MG tablet Take 10 mg by mouth daily.     predniSONE (DELTASONE) 5 MG tablet Take 1 tablet (5  mg total) by mouth daily with breakfast. 90 tablet 1   TRELEGY ELLIPTA 200-62.5-25 MCG/ACT AEPB Inhale 1 puff into the lungs daily.     Zinc 50 MG TABS Take 1 tablet by mouth daily.     No current facility-administered medications for this visit.     Objective: Vital signs in last 24 hours: BP 132/72   Pulse 83   Ht 5' 10.5" (1.791 m)   Wt 141 lb 1.6 oz (64 kg)   BMI 19.96 kg/m   Intake/Output from previous day: No intake/output data recorded. Intake/Output this shift: @IOTHISSHIFT @   Physical Exam Vitals reviewed.  Constitutional:      Appearance: Normal appearance.  Neurological:     Mental Status: He is alert.     Lab Results:  No results found for this or any previous visit (from the past 24 hour(s)).      BMET Recent Labs    05/23/23 1053  NA 136  K 4.5  CL 107  CO2 20*  GLUCOSE 89  BUN 27*  CREATININE 1.04  CALCIUM 10.6*   PT/INR No results for input(s): "LABPROT", "INR" in the last 72 hours. ABG No results for input(s): "PHART", "HCO3" in the last 72 hours.  Invalid input(s): "PCO2", "  PO2" Recent Results (from the past 2160 hour(s))  Genetic Screening Order     Status: None   Collection Time: 02/26/23 12:55 PM  Result Value Ref Range   Genetic Screening Order Specimen collected. Sent to reference laboratory.     Comment: Performed at [facility], [address], [place], [place] [address]  Magnesium     Status: None   Collection Time: 02/26/23 12:55 PM  Result Value Ref Range   Magnesium 2.0 1.7 - 2.4 mg/dL    Comment: Performed at [facility], [address] [place], [place] [address]  Comprehensive metabolic panel     Status: Abnormal   Collection Time: 02/26/23 12:55 PM  Result Value Ref Range   Sodium 134 (L) 135 - 145 mmol/L   Potassium 4.7 3.5 - 5.1 mmol/L   Chloride 104 98 - 111 mmol/L   CO2 21 (L) 22 - 32 mmol/L   Glucose, Bld 109 (H) 70 - 99 mg/dL    Comment: Glucose reference range applies only to samples taken after  fasting for at least 8 hours.   BUN 25 (H) 8 - 23 mg/dL   Creatinine, Ser 2.13 0.61 - 1.24 mg/dL   Calcium 08.6 (H) 8.9 - 10.3 mg/dL   Total Protein 7.0 6.5 - 8.1 g/dL   Albumin 3.8 3.5 - 5.0 g/dL   AST 17 15 - 41 U/L   ALT 18 0 - 44 U/L   Alkaline Phosphatase 71 38 - 126 U/L   Total Bilirubin 0.7 0.3 - 1.2 mg/dL   GFR, Estimated >57 >84 mL/min    Comment: (NOTE) Calculated using the CKD-EPI Creatinine Equation (2021)    Anion gap 9 5 - 15    Comment: Performed at [facility], [address], [place], [place] [address]  CBC with Differential     Status: Abnormal   Collection Time: 02/26/23 12:55 PM  Result Value Ref Range   WBC 9.2 4.0 - 10.5 K/uL   RBC 4.24 4.22 - 5.81 MIL/uL   Hemoglobin 12.9 (L) 13.0 - 17.0 g/dL   HCT 52.8 41.3 - 24.4 %   MCV 92.0 80.0 - 100.0 fL   MCH 30.4 26.0 - 34.0 pg   MCHC 33.1 30.0 - 36.0 g/dL   RDW 01.0 (H) 27.2 - 53.6 %   Platelets 363 150 - 400 K/uL   nRBC 0.0 0.0 - 0.2 %   Neutrophils Relative % 80 %   Neutro Abs 7.3 1.7 - 7.7 K/uL   Lymphocytes Relative 12 %   Lymphs Abs 1.1 0.7 - 4.0 K/uL   Monocytes Relative 7 %   Monocytes Absolute 0.6 0.1 - 1.0 K/uL   Eosinophils Relative 1 %   Eosinophils Absolute 0.1 0.0 - 0.5 K/uL   Basophils Relative 0 %   Basophils Absolute 0.0 0.0 - 0.1 K/uL   Immature Granulocytes 0 %   Abs Immature Granulocytes 0.03 0.00 - 0.07 K/uL    Comment: Performed at [facility], [address], [place], [place] [address]  PSA     Status: None   Collection Time: 03/20/23 10:15 AM  Result Value Ref Range   Prostatic Specific Antigen 0.06 0.00 - 4.00 ng/mL    Comment: (NOTE) While PSA levels of <=4.00 ng/ml are reported as reference range, some men with levels below 4.00 ng/ml can have prostate cancer and many men with PSA above 4.00 ng/ml do not have prostate cancer.  Other tests such as free PSA, age specific reference ranges, PSA  velocity and PSA doubling time may be helpful especially in men less than 46  years old. Performed at [facility], [address] [place], [place] [address]   Magnesium     Status: None   Collection Time: 03/20/23 10:16 AM  Result Value Ref Range   Magnesium 1.8 1.7 - 2.4 mg/dL    Comment: Performed at [facility], [address] [place], [place] [address]  Comprehensive metabolic panel     Status: Abnormal   Collection Time: 03/20/23 10:16 AM  Result Value Ref Range   Sodium 129 (L) 135 - 145 mmol/L   Potassium 4.4 3.5 - 5.1 mmol/L   Chloride 99 98 - 111 mmol/L   CO2 22 22 - 32 mmol/L   Glucose, Bld 118 (H) 70 - 99 mg/dL    Comment: Glucose reference range applies only to samples taken after fasting for at least 8 hours.   BUN 15 8 - 23 mg/dL   Creatinine, Ser 2.84 0.61 - 1.24 mg/dL   Calcium 13.2 8.9 - 44.0 mg/dL   Total Protein 7.0 6.5 - 8.1 g/dL   Albumin 3.7 3.5 - 5.0 g/dL   AST 16 15 - 41 U/L   ALT 15 0 - 44 U/L   Alkaline Phosphatase 72 38 - 126 U/L   Total Bilirubin 0.7 0.3 - 1.2 mg/dL   GFR, Estimated >10 >27 mL/min    Comment: (NOTE) Calculated using the CKD-EPI Creatinine Equation (2021)    Anion gap 8 5 - 15    Comment: Performed at [facility], [address], [place], [place] [address]  CBC with Differential/Platelet     Status: Abnormal   Collection Time: 03/20/23 10:16 AM  Result Value Ref Range   WBC 10.7 (H) 4.0 - 10.5 K/uL   RBC 3.98 (L) 4.22 - 5.81 MIL/uL   Hemoglobin 12.0 (L) 13.0 - 17.0 g/dL   HCT 44.0 (L) 34.7 - 42.5 %   MCV 91.7 80.0 - 100.0 fL   MCH 30.2 26.0 - 34.0 pg   MCHC 32.9 30.0 - 36.0 g/dL   RDW 95.6 38.7 - 56.4 %   Platelets 331 150 - 400 K/uL   nRBC 0.0 0.0 - 0.2 %   Neutrophils Relative % 85 %   Neutro Abs 9.2 (H) 1.7 - 7.7 K/uL   Lymphocytes Relative 8 %   Lymphs Abs 0.8 0.7 - 4.0 K/uL   Monocytes Relative 5 %   Monocytes Absolute 0.5 0.1 - 1.0 K/uL   Eosinophils Relative 1 %   Eosinophils Absolute 0.1 0.0 - 0.5 K/uL   Basophils Relative 0 %   Basophils Absolute 0.0 0.0 - 0.1 K/uL   Immature  Granulocytes 1 %   Abs Immature Granulocytes 0.07 0.00 - 0.07 K/uL    Comment: Performed at [facility], [address] [place], [place] [address]  PSA     Status: None   Collection Time: 05/15/23 10:31 AM  Result Value Ref Range   Prostate Specific Ag, Serum <0.1 0.0 - 4.0 ng/mL    Comment: Roche ECLIA methodology. According to the American Urological Association, Serum PSA should decrease and remain at undetectable levels after radical prostatectomy. The AUA defines biochemical recurrence as an initial PSA value 0.2 ng/mL or greater followed by a subsequent confirmatory PSA value 0.2 ng/mL or greater. Values obtained with different assay methods or kits cannot be used interchangeably. Results cannot be interpreted as absolute evidence of the presence or absence of malignant disease.  Testosterone     Status: Abnormal   Collection Time: 05/15/23 10:31 AM  Result Value Ref Range   Testosterone <3 (L) 264 - 916 ng/dL    Comment: Adult male reference interval is based on a population of healthy nonobese males (BMI <30) between 105 and 109 years old. Travison, et.al. JCEM [id number]. PMID: [id number].   Urinalysis, Routine w reflex microscopic     Status: None   Collection Time: 05/22/23 11:19 AM  Result Value Ref Range   Specific Gravity, UA 1.015 1.005 - 1.030   pH, UA 7.0 5.0 - 7.5   Color, UA Yellow Yellow   Appearance Ur Clear Clear   Leukocytes,UA Negative Negative   Protein,UA Negative Negative/Trace   Glucose, UA Negative Negative   Ketones, UA Negative Negative   RBC, UA Negative Negative   Bilirubin, UA Negative Negative   Urobilinogen, Ur 1.0 0.2 - 1.0 mg/dL   Nitrite, UA Negative Negative   Microscopic Examination Comment     Comment: Microscopic not indicated and not performed.  Magnesium     Status: None   Collection Time: 05/23/23 10:53 AM  Result Value Ref Range   Magnesium 1.9 1.7 - 2.4 mg/dL    Comment: Performed at [facility], [address],  [place], [place] [address]  Comprehensive metabolic panel     Status: Abnormal   Collection Time: 05/23/23 10:53 AM  Result Value Ref Range   Sodium 136 135 - 145 mmol/L   Potassium 4.5 3.5 - 5.1 mmol/L   Chloride 107 98 - 111 mmol/L   CO2 20 (L) 22 - 32 mmol/L   Glucose, Bld 89 70 - 99 mg/dL    Comment: Glucose reference range applies only to samples taken after fasting for at least 8 hours.   BUN 27 (H) 8 - 23 mg/dL   Creatinine, Ser 2.95 0.61 - 1.24 mg/dL   Calcium 28.4 (H) 8.9 - 10.3 mg/dL   Total Protein 7.3 6.5 - 8.1 g/dL   Albumin 3.7 3.5 - 5.0 g/dL   AST 14 (L) 15 - 41 U/L   ALT 13 0 - 44 U/L   Alkaline Phosphatase 53 38 - 126 U/L   Total Bilirubin 0.5 0.3 - 1.2 mg/dL   GFR, Estimated >13 >24 mL/min    Comment: (NOTE) Calculated using the CKD-EPI Creatinine Equation (2021)    Anion gap 9 5 - 15    Comment: Performed at [facility], [address] [place], [place] [address]  CBC with Differential/Platelet     Status: Abnormal   Collection Time: 05/23/23 10:53 AM  Result Value Ref Range   WBC 16.5 (H) 4.0 - 10.5 K/uL   RBC 3.88 (L) 4.22 - 5.81 MIL/uL   Hemoglobin 11.7 (L) 13.0 - 17.0 g/dL   HCT 72.5 (L) 36.6 - 44.0 %   MCV 94.3 80.0 - 100.0 fL   MCH 30.2 26.0 - 34.0 pg   MCHC 32.0 30.0 - 36.0 g/dL   RDW 34.7 (H) 42.5 - 95.6 %   Platelets 435 (H) 150 - 400 K/uL   nRBC 0.0 0.0 - 0.2 %   Neutrophils Relative % 81 %   Neutro Abs 13.6 (H) 1.7 - 7.7 K/uL   Lymphocytes Relative 9 %   Lymphs Abs 1.4 0.7 - 4.0 K/uL   Monocytes Relative 6 %   Monocytes Absolute 1.0 0.1 - 1.0 K/uL   Eosinophils Relative 2 %   Eosinophils Absolute 0.3 0.0 - 0.5 K/uL   Basophils  Relative 1 %   Basophils Absolute 0.1 0.0 - 0.1 K/uL   Immature Granulocytes 1 %   Abs Immature Granulocytes 0.14 (H) 0.00 - 0.07 K/uL    Comment: Performed at [facility], [address], [place], [place] [address]     UA is clear.  Studies/Results: DG Bone Density  Result Date: 03/17/2023 EXAM: DUAL X-RAY  ABSORPTIOMETRY (DXA) FOR BONE MINERAL DENSITY IMPRESSION: Your patient Tallen Schnorr completed a BMD test on 03/17/2023 using the [employer] DXA System (software version: 14.10) manufactured by [employer]. The following summarizes the results of our evaluation. Technologist: [facility] PATIENT BIOGRAPHICAL: Name: Nancy, Manuele Patient ID: [id number] Birth Date: 03-Aug-1946 Height: 70.5 in. Gender: Male Exam Date: 03/17/2023 Weight: 143.8 lbs. Indications: Caucasian, Chronic Steroid Use, History of Fracture (Adult), Prostate Cancer, Tobacco User Fractures: Hip Treatments: Asprin, Calcium, Vitamin D DENSITOMETRY RESULTS: Site         Region     Measured Date Measured Age WHO Classification Young Adult T-score BMD         %Change vs. Previous Significant Change (*) AP Spine L1-L3 03/17/2023 75.7 N/A -1.9 0.981 g/cm2 - - Right Femur Neck 03/17/2023 75.7 N/A -4.0 0.552 g/cm2 - - Right Femur Total 03/17/2023 75.7 N/A -3.8 0.560 g/cm2 - - Left Forearm Radius 33% 03/17/2023 75.7 N/A -3.7 0.508 g/cm2 - - ASSESSMENT: BMD as determined from Femur Neck is 0.552 g/cm2 with a T-score of -4.0. This patient is considered osteoporotic by [employer] [facility]) Criteria. The scan quality is good. L4 was excluded due to advanced degenerative changes. Lt. hip was excluded due to surgical repair. [employer] writer [facility]) criteria for post-menopausal, Caucasian Women: Normal:       T-score at or above -1 SD Osteopenia:   T-score between -1 and -2.5 SD Osteoporosis: T-score at or below -2.5 SD RECOMMENDATIONS: 1. All patients should optimize calcium and vitamin D intake. 2. Consider FDA-approved medical therapies in postmenopausal women and med aged 61 years and older, based on the following: a. A hip or vertebral (clinical or morphometric) fracture b. T-score< -2.5 at the femoral neck or spine after appropriate evaluation to exclude secondary causes c. Low bone mass (T-score between -1.0 and -2.5 at the femoral neck  or spine) and a 10-year probability of a hip fracture > 3% or a 10-year probability of a major osteoporosis-related fracture > 20% based on the [place]-adapted WHO algorithm d. Clinician judgment and/or patient preferences may indicate treatment for people with 10-year fracture probabilities above or below these levels FOLLOW-UP: People with diagnosed cases of osteoporosis or osteopenia should be regularly tested for bone mineral density. For patients eligible for Medicare, routine testing is allowed once every 2 years. Testing frequency can be increased for patients who have rapidly progressing disease, or for those who are receiving medical therapy to restore bone mass. I have reviewed this report, and agree with the above findings. [facility] Electronically Signed   By: Frederico Hamman M.D.   On: 03/17/2023 11:06    Notes from Dr. Ellin Saba reviewed.      Assessment/Plan: Prostate cancer with metastases to external iliac and retrocaval nodes s/p RALP and salvage radiation therapy.  His PSA is undetectible on current therapy.   He will continue the firmagon and abiaterone.  He will have to return next week for the firmagon since the office was out of stock.  He will return to see me in 3 months with a PSA.  Fatigue.  I will reach out to Dr. Ellin Saba to see if dose reduction of the abiaterone might be appropriate.   Meds ordered this encounter  Medications   DISCONTD: degarelix (FIRMAGON) injection 80 mg     Orders Placed This Encounter  Procedures   Urinalysis, Routine w reflex microscopic   PSA    Standing Status:   Future    Standing Expiration Date:   11/22/2023     Return in about 3 months (around 08/22/2023) for with PSA .  Continue monthly firmagon. .    CC: Dr. [name].      Bjorn Pippin 05/23/2023 [phone]

## 2023-05-23 ENCOUNTER — Inpatient Hospital Stay: Payer: Medicare HMO | Attending: Hematology

## 2023-05-23 DIAGNOSIS — Z9079 Acquired absence of other genital organ(s): Secondary | ICD-10-CM | POA: Insufficient documentation

## 2023-05-23 DIAGNOSIS — E7849 Other hyperlipidemia: Secondary | ICD-10-CM | POA: Diagnosis not present

## 2023-05-23 DIAGNOSIS — F17210 Nicotine dependence, cigarettes, uncomplicated: Secondary | ICD-10-CM | POA: Diagnosis not present

## 2023-05-23 DIAGNOSIS — M109 Gout, unspecified: Secondary | ICD-10-CM | POA: Diagnosis not present

## 2023-05-23 DIAGNOSIS — Z79899 Other long term (current) drug therapy: Secondary | ICD-10-CM | POA: Insufficient documentation

## 2023-05-23 DIAGNOSIS — D529 Folate deficiency anemia, unspecified: Secondary | ICD-10-CM | POA: Diagnosis not present

## 2023-05-23 DIAGNOSIS — C775 Secondary and unspecified malignant neoplasm of intrapelvic lymph nodes: Secondary | ICD-10-CM | POA: Diagnosis not present

## 2023-05-23 DIAGNOSIS — M910 Juvenile osteochondrosis of pelvis: Secondary | ICD-10-CM | POA: Diagnosis not present

## 2023-05-23 DIAGNOSIS — Z7982 Long term (current) use of aspirin: Secondary | ICD-10-CM | POA: Insufficient documentation

## 2023-05-23 DIAGNOSIS — Z0000 Encounter for general adult medical examination without abnormal findings: Secondary | ICD-10-CM | POA: Diagnosis not present

## 2023-05-23 DIAGNOSIS — C61 Malignant neoplasm of prostate: Secondary | ICD-10-CM | POA: Insufficient documentation

## 2023-05-23 DIAGNOSIS — Z125 Encounter for screening for malignant neoplasm of prostate: Secondary | ICD-10-CM | POA: Diagnosis not present

## 2023-05-23 DIAGNOSIS — Z1329 Encounter for screening for other suspected endocrine disorder: Secondary | ICD-10-CM | POA: Diagnosis not present

## 2023-05-23 DIAGNOSIS — D519 Vitamin B12 deficiency anemia, unspecified: Secondary | ICD-10-CM | POA: Diagnosis not present

## 2023-05-23 DIAGNOSIS — R5383 Other fatigue: Secondary | ICD-10-CM | POA: Diagnosis not present

## 2023-05-23 DIAGNOSIS — I10 Essential (primary) hypertension: Secondary | ICD-10-CM | POA: Diagnosis not present

## 2023-05-23 DIAGNOSIS — M810 Age-related osteoporosis without current pathological fracture: Secondary | ICD-10-CM | POA: Diagnosis not present

## 2023-05-23 LAB — CBC WITH DIFFERENTIAL/PLATELET
Abs Immature Granulocytes: 0.14 10*3/uL — ABNORMAL HIGH (ref 0.00–0.07)
Basophils Absolute: 0.1 10*3/uL (ref 0.0–0.1)
Basophils Relative: 1 %
Eosinophils Absolute: 0.3 10*3/uL (ref 0.0–0.5)
Eosinophils Relative: 2 %
HCT: 36.6 % — ABNORMAL LOW (ref 39.0–52.0)
Hemoglobin: 11.7 g/dL — ABNORMAL LOW (ref 13.0–17.0)
Immature Granulocytes: 1 %
Lymphocytes Relative: 9 %
Lymphs Abs: 1.4 10*3/uL (ref 0.7–4.0)
MCH: 30.2 pg (ref 26.0–34.0)
MCHC: 32 g/dL (ref 30.0–36.0)
MCV: 94.3 fL (ref 80.0–100.0)
Monocytes Absolute: 1 10*3/uL (ref 0.1–1.0)
Monocytes Relative: 6 %
Neutro Abs: 13.6 10*3/uL — ABNORMAL HIGH (ref 1.7–7.7)
Neutrophils Relative %: 81 %
Platelets: 435 10*3/uL — ABNORMAL HIGH (ref 150–400)
RBC: 3.88 MIL/uL — ABNORMAL LOW (ref 4.22–5.81)
RDW: 16.1 % — ABNORMAL HIGH (ref 11.5–15.5)
WBC: 16.5 10*3/uL — ABNORMAL HIGH (ref 4.0–10.5)
nRBC: 0 % (ref 0.0–0.2)

## 2023-05-23 LAB — COMPREHENSIVE METABOLIC PANEL
ALT: 13 U/L (ref 0–44)
AST: 14 U/L — ABNORMAL LOW (ref 15–41)
Albumin: 3.7 g/dL (ref 3.5–5.0)
Alkaline Phosphatase: 53 U/L (ref 38–126)
Anion gap: 9 (ref 5–15)
BUN: 27 mg/dL — ABNORMAL HIGH (ref 8–23)
CO2: 20 mmol/L — ABNORMAL LOW (ref 22–32)
Calcium: 10.6 mg/dL — ABNORMAL HIGH (ref 8.9–10.3)
Chloride: 107 mmol/L (ref 98–111)
Creatinine, Ser: 1.04 mg/dL (ref 0.61–1.24)
GFR, Estimated: >60 mL/min (ref >60)
Glucose, Bld: 89 mg/dL (ref 70–99)
Potassium: 4.5 mmol/L (ref 3.5–5.1)
Sodium: 136 mmol/L (ref 135–145)
Total Bilirubin: 0.5 mg/dL (ref 0.3–1.2)
Total Protein: 7.3 g/dL (ref 6.5–8.1)

## 2023-05-23 LAB — MAGNESIUM: Magnesium: 1.9 mg/dL (ref 1.7–2.4)

## 2023-05-28 ENCOUNTER — Other Ambulatory Visit (HOSPITAL_COMMUNITY): Payer: Self-pay

## 2023-05-28 DIAGNOSIS — M109 Gout, unspecified: Secondary | ICD-10-CM | POA: Diagnosis not present

## 2023-05-28 DIAGNOSIS — Z1331 Encounter for screening for depression: Secondary | ICD-10-CM | POA: Diagnosis not present

## 2023-05-28 DIAGNOSIS — I4891 Unspecified atrial fibrillation: Secondary | ICD-10-CM | POA: Diagnosis not present

## 2023-05-28 DIAGNOSIS — R002 Palpitations: Secondary | ICD-10-CM | POA: Diagnosis not present

## 2023-05-28 DIAGNOSIS — M353 Polymyalgia rheumatica: Secondary | ICD-10-CM | POA: Diagnosis not present

## 2023-05-28 DIAGNOSIS — C774 Secondary and unspecified malignant neoplasm of inguinal and lower limb lymph nodes: Secondary | ICD-10-CM | POA: Diagnosis not present

## 2023-05-28 DIAGNOSIS — Z1389 Encounter for screening for other disorder: Secondary | ICD-10-CM | POA: Diagnosis not present

## 2023-05-28 DIAGNOSIS — D692 Other nonthrombocytopenic purpura: Secondary | ICD-10-CM | POA: Diagnosis not present

## 2023-05-28 DIAGNOSIS — J449 Chronic obstructive pulmonary disease, unspecified: Secondary | ICD-10-CM | POA: Diagnosis not present

## 2023-05-28 DIAGNOSIS — C61 Malignant neoplasm of prostate: Secondary | ICD-10-CM | POA: Diagnosis not present

## 2023-05-28 DIAGNOSIS — Z0001 Encounter for general adult medical examination with abnormal findings: Secondary | ICD-10-CM | POA: Diagnosis not present

## 2023-05-28 NOTE — Progress Notes (Signed)
[facility] [address] [place] [address]    Clinic Day:  05/29/2023  Referring physician: Richardean Chimera, MD  Patient Care Team: Richardean Chimera, MD as PCP - General (Family Medicine) Wyline Mood Dorothe Pea, MD as PCP - Cardiology (Cardiology) Jena Gauss Gerrit Friends, MD as Consulting Physician (Gastroenterology) Doreatha Massed, MD as Medical Oncologist (Medical Oncology) Therese Sarah, RN as Oncology Nurse Navigator (Medical Oncology)   ASSESSMENT & PLAN:   Assessment: 1.  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 Ileene Hutchinson on 09/22/2018 - Final pathology: PT3b N0, Gleason 4+3 prostate adenocarcinoma, postoperative PSA remained detectable and rose to 5.7 - Axumin PET (06/2019): Local recurrence - Salvage XRT to the prostate fossa and pelvic lymph nodes, Dr. Brendolyn Patty from 07/05/2019 - 08/31/2019, without concurrent ADT.  PSA responded well reaching a nadir of 0.1. - PSA rose again to 3.4 in 06/2022, PSMA PET scan: Oligometastatic disease involving single internal iliac lymph node. - 07/29/2022 - 08/09/2022: 10 fx course of UHRT to right internal iliac node, PSA only decreased slightly to 3.1 in 10/2022, further went up to 5 on 12/05/2022 - PSMA PET (12/19/2022): New radiotracer avid 2 mm infrarenal retrocaval lymph node and a minimal increase size of the previously treated right common iliac lymph node measuring 3 mm compared to 2 mm previously, no evidence of recurrence in the prostatectomy bed or bone mets. - Loading dose of Firmagon with Dr. Annabell Howells on 01/09/2023 - Abiraterone and prednisone started on 01/18/2023. - XRT to the retrocaval lymph node and right common iliac lymph node completed on 01/29/2023, 10 fractions   2.  Social/family history: - He lives with wife at home.  He is independent of all ADLs and IADLs.  Uses cane for longer distances.  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  Current active smoker, 1 pack/day for 45 years. - Father had lung cancer and was smoker.    Plan: 1.  Metastatic CSPC to abdominal/high pelvic lymph node (low-volume): - He is tolerating Zytiga and prednisone reasonably well. - Reviewed labs today: Normal LFTs and creatinine.  Mild hyponatremia with sodium 129.  CBC grossly normal with mild normocytic anemia. - PSA 0.06, improved from 0.4 on 02/13/2023 and 6.9 on 01/02/2023. - He will continue monthly degarelix with Dr. Annabell Howells. - Continue Zytiga 1000 mg daily along with prednisone 5 mg daily.  - RTC 2 months for follow-up with repeat labs.   2.  Hypomagnesemia: - Continue magnesium 1 tablet daily.  Magnesium is normal today.   3.  Mild hypercalcemia: - He was taking Caltrate 2 tablets/day which was started in March of this year.  At last visit I held his calcium pills. - Today calcium improved to 10.2 from 10.7. - Since we are starting him on Prolia, he may start back on calcium 1 tablet daily.  4.  Osteoporosis (DEXA on 03/17/2023 T score -4): - We reviewed results of DEXA scan which showed osteoporosis. - We discussed the treatment of osteoporosis with Prolia injections every 6 months.  We discussed side effects in detail including hypocalcemia and ONJ. - He is agreeable.  We will schedule his first injection after insurance authorization.    No orders of the defined types were placed in this encounter.     Alben Deeds Teague,acting as a Neurosurgeon for Doreatha Massed, MD.,have documented all relevant documentation on  the behalf of Doreatha Massed, MD,as directed by  Doreatha Massed, MD while in the presence of Doreatha Massed, MD.  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 RALP 09/22/18 (Dr. Ileene Hutchinson) 2. Salvage XRT + ADT 07/05/2019 - 08/31/2019 (Dr. Brendolyn Patty) 3. UHRT to right internal iliac node 07/29/22 - 08/09/22 4. UHRT to infrarenal retrocaval lymph node 01/16/23 - 01/29/23  Current Therapy:  Deborra Medina and Abiraterone    HISTORY OF PRESENT ILLNESS:   Oncology History   No history exists.     INTERVAL HISTORY:   Dan [name] is a 77 y.o. 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 He was last seen by me on 03/27/23.  Today, he states that he is doing well overall. His appetite level is at ***%. His energy level is at ***%.  PAST MEDICAL HISTORY:   Past Medical History: Past Medical History:  Diagnosis Date   BPH (benign prostatic hyperplasia)    COPD (chronic obstructive pulmonary disease) (HCC)    Depression    ED (erectile dysfunction)    Gout    Hyperlipidemia    Hypertension    Loss of hearing    Nocturia    Paroxysmal atrial fibrillation (HCC)    Prostate cancer (HCC)     Surgical History: Past Surgical History:  Procedure Laterality Date   CARDIOVASCULAR STRESS TEST  12/31/2004   normal perfuction nuclear study w/ no evidence ischemia /  normal LV function and wall motion , ef 52%   PROSTATECTOMY  2019   TOTAL HIP ARTHROPLASTY      Social History: Social History   Socioeconomic History   Marital status: Married    Spouse name: Not on file   Number of children: Not on file   Years of education: Not on file   Highest education level: Not on file  Occupational History   Not on file  Tobacco Use   Smoking status: Every Day    Types: Cigarettes    Start date: 06/12/1967    Passive exposure: Never   Smokeless tobacco: Never  Vaping Use   Vaping status: Never Used  Substance and Sexual Activity   Alcohol use: Not  Currently   Drug use: Not Currently   Sexual activity: Not on file  Other Topics Concern   Not on file  Social History Narrative   Not on file   Social Determinants of Health   Financial Resource Strain: Not on file  Food Insecurity: No Food Insecurity (01/14/2023)   Hunger Vital Sign    Worried About Running Out of Food in the Last Year: Never true    Ran Out of Food in the Last Year: Never true  Transportation Needs: No Transportation Needs (01/14/2023)   PRAPARE - Administrator, Civil Service (Medical): No    Lack of Transportation (Non-Medical): No  Physical Activity: Not on file  Stress: Not on file  Social Connections: Unknown (03/18/2022)   [facility]   Social Network    Social Network: Not on file  Intimate Partner Violence: Not At Risk (  01/14/2023)   Humiliation, Afraid, Rape, and Kick questionnaire    Fear of Current or Ex-Partner: No    Emotionally Abused: No    Physically Abused: No    Sexually Abused: No    Family History: Family History  Problem Relation Age of Onset   Hypertension Mother    Cancer Father        lung   Colon cancer Neg Hx    Inflammatory bowel disease Neg Hx    Pancreatic cancer Neg Hx     Current Medications:  Current Outpatient Medications:    abiraterone acetate (ZYTIGA) 250 MG tablet, Take 4 tablets (1,000 mg total) by mouth daily. Take on an empty stomach 1 hour before or 2 hours after a meal, Disp: 120 tablet, Rfl: 3   albuterol (PROVENTIL HFA;VENTOLIN HFA) 108 (90 Base) MCG/ACT inhaler, Inhale 2 puffs into the lungs every 6 (six) hours as needed for wheezing or shortness of breath., Disp: , Rfl:    albuterol (PROVENTIL) (2.5 MG/3ML) 0.083% nebulizer solution, Take 2.5 mg by nebulization every 6 (six) hours as needed., Disp: , Rfl:    allopurinol (ZYLOPRIM) 100 MG tablet, Take 100 mg by mouth daily., Disp: , Rfl:    Ascorbic Acid (VITAMIN C) 1000 MG tablet, Take 1,000 mg by mouth daily., Disp: , Rfl:     aspirin EC 81 MG tablet, Take 81 mg by mouth daily. Swallow whole., Disp: , Rfl:    cholecalciferol (VITAMIN D3) 25 MCG (1000 UNIT) tablet, Take 1,000 Units by mouth daily., Disp: , Rfl:    colchicine 0.6 MG tablet, Take 1 tablet by mouth daily as needed., Disp: , Rfl:    diltiazem (CARDIZEM CD) 360 MG 24 hr capsule, Take by mouth daily., Disp: , Rfl:    losartan (COZAAR) 25 MG tablet, TAKE 1 TABLET (25 MG TOTAL) BY MOUTH DAILY., Disp: 90 tablet, Rfl: 2   magnesium oxide (MAG-OX) 400 (240 Mg) MG tablet, Take 1 tablet (400 mg total) by mouth daily., Disp: 30 tablet, Rfl: 3   megestrol (MEGACE) 40 MG/ML suspension, Take by mouth., Disp: , Rfl:    potassium chloride (MICRO-K) 10 MEQ CR capsule, TAKE 2 CAPSULES BY MOUTH DAILY., Disp: 7 capsule, Rfl: 0   predniSONE (DELTASONE) 5 MG tablet, Take 10 mg by mouth daily., Disp: , Rfl:    predniSONE (DELTASONE) 5 MG tablet, Take 1 tablet (5 mg total) by mouth daily with breakfast., Disp: 90 tablet, Rfl: 1   TRELEGY ELLIPTA 200-62.5-25 MCG/ACT AEPB, Inhale 1 puff into the lungs daily., Disp: , Rfl:    Zinc 50 MG TABS, Take 1 tablet by mouth daily., Disp: , Rfl:    Allergies: Allergies  Allergen Reactions   Tamsulosin Hives and Itching    REVIEW OF SYSTEMS:   Review of Systems  Constitutional:  Negative for chills, fatigue and fever.  HENT:   Negative for lump/mass, mouth sores, nosebleeds, sore throat and trouble swallowing.   Eyes:  Negative for eye problems.  Respiratory:  Negative for cough and shortness of breath.   Cardiovascular:  Negative for chest pain, leg swelling and palpitations.  Gastrointestinal:  Negative for abdominal pain, constipation, diarrhea, nausea and vomiting.  Genitourinary:  Negative for bladder incontinence, difficulty urinating, dysuria, frequency, hematuria and nocturia.   Musculoskeletal:  Negative for arthralgias, back pain, flank pain, myalgias and neck pain.  Skin:  Negative for itching and rash.  Neurological:   Negative for dizziness, headaches and numbness.  Hematological:  Does not bruise/bleed easily.  Psychiatric/Behavioral:  Negative for depression, sleep disturbance and suicidal ideas. The patient is not nervous/anxious.   All other systems reviewed and are negative.    VITALS:   There were no vitals taken for this visit.  Wt Readings from Last 3 Encounters:  05/22/23 141 lb 1.6 oz (64 kg)  03/27/23 141 lb 6.4 oz (64.1 kg)  02/26/23 143 lb 12.8 oz (65.2 kg)    There is no height or weight on file to calculate BMI.  Performance status (ECOG): 1 - Symptomatic but completely ambulatory  PHYSICAL EXAM:   Physical Exam Vitals and nursing note reviewed. Exam conducted with a chaperone present.  Constitutional:      Appearance: Normal appearance.  Cardiovascular:     Rate and Rhythm: Normal rate and regular rhythm.     Pulses: Normal pulses.     Heart sounds: Normal heart sounds.  Pulmonary:     Effort: Pulmonary effort is normal.     Breath sounds: Normal breath sounds.  Abdominal:     Palpations: Abdomen is soft. There is no hepatomegaly, splenomegaly or mass.     Tenderness: There is no abdominal tenderness.  Musculoskeletal:     Right lower leg: No edema.     Left lower leg: No edema.  Lymphadenopathy:     Cervical: No cervical adenopathy.     Right cervical: No superficial, deep or posterior cervical adenopathy.    Left cervical: No superficial, deep or posterior cervical adenopathy.     Upper Body:     Right upper body: No supraclavicular or axillary adenopathy.     Left upper body: No supraclavicular or axillary adenopathy.  Neurological:     General: No focal deficit present.     Mental Status: He is alert and oriented to person, place, and time.  Psychiatric:        Mood and Affect: Mood normal.        Behavior: Behavior normal.     LABS:      Latest Ref Rng & Units 05/23/2023   10:53 AM 03/20/2023   10:16 AM 02/26/2023   12:55 PM  CBC  WBC 4.0 - 10.5 K/uL  16.5  10.7  9.2   Hemoglobin 13.0 - 17.0 g/dL 42.5  95.6  38.7   Hematocrit 39.0 - 52.0 % 36.6  36.5  39.0   Platelets 150 - 400 K/uL 435  331  363       Latest Ref Rng & Units 05/23/2023   10:53 AM 03/20/2023   10:16 AM 02/26/2023   12:55 PM  CMP  Glucose 70 - 99 mg/dL 89  564  332   BUN 8 - 23 mg/dL 27  15  25    Creatinine 0.61 - 1.24 mg/dL 9.51  8.84  1.66   Sodium 135 - 145 mmol/L 136  129  134   Potassium 3.5 - 5.1 mmol/L 4.5  4.4  4.7   Chloride 98 - 111 mmol/L 107  99  104   CO2 22 - 32 mmol/L 20  22  21    Calcium 8.9 - 10.3 mg/dL 06.3  01.6  01.0   Total Protein 6.5 - 8.1 g/dL 7.3  7.0  7.0   Total Bilirubin 0.3 - 1.2 mg/dL 0.5  0.7  0.7   Alkaline Phos 38 - 126 U/L 53  72  71   AST 15 - 41 U/L 14  16  17    ALT 0 - 44 U/L 13  15  18  No results found for: "CEA1", "CEA" / No results found for: "CEA1", "CEA" Lab Results  Component Value Date   PSA1 <0.1 05/15/2023   No results found for: "WUJ811" No results found for: "CAN125"  No results found for: "TOTALPROTELP", "ALBUMINELP", "A1GS", "A2GS", "BETS", "BETA2SER", "GAMS", "MSPIKE", "SPEI" No results found for: "TIBC", "FERRITIN", "IRONPCTSAT" No results found for: "LDH"   STUDIES:   No results found.

## 2023-05-29 ENCOUNTER — Inpatient Hospital Stay: Payer: Medicare HMO | Admitting: Hematology

## 2023-05-29 ENCOUNTER — Ambulatory Visit (INDEPENDENT_AMBULATORY_CARE_PROVIDER_SITE_OTHER): Payer: Medicare HMO

## 2023-05-29 VITALS — BP 139/64 | HR 73 | Temp 99.1°F | Resp 20 | Wt 148.3 lb

## 2023-05-29 DIAGNOSIS — Z7982 Long term (current) use of aspirin: Secondary | ICD-10-CM | POA: Diagnosis not present

## 2023-05-29 DIAGNOSIS — M810 Age-related osteoporosis without current pathological fracture: Secondary | ICD-10-CM | POA: Diagnosis not present

## 2023-05-29 DIAGNOSIS — Z9079 Acquired absence of other genital organ(s): Secondary | ICD-10-CM | POA: Diagnosis not present

## 2023-05-29 DIAGNOSIS — C775 Secondary and unspecified malignant neoplasm of intrapelvic lymph nodes: Secondary | ICD-10-CM

## 2023-05-29 DIAGNOSIS — C61 Malignant neoplasm of prostate: Secondary | ICD-10-CM

## 2023-05-29 DIAGNOSIS — F17210 Nicotine dependence, cigarettes, uncomplicated: Secondary | ICD-10-CM | POA: Diagnosis not present

## 2023-05-29 DIAGNOSIS — Z79899 Other long term (current) drug therapy: Secondary | ICD-10-CM | POA: Diagnosis not present

## 2023-05-29 DIAGNOSIS — M910 Juvenile osteochondrosis of pelvis: Secondary | ICD-10-CM | POA: Diagnosis not present

## 2023-05-29 DIAGNOSIS — D508 Other iron deficiency anemias: Secondary | ICD-10-CM

## 2023-05-29 MED ORDER — DEGARELIX ACETATE 80 MG ~~LOC~~ SOLR
80.0000 mg | Freq: Once | SUBCUTANEOUS | Status: AC
Start: 2023-05-29 — End: 2023-05-29
  Administered 2023-05-29: 80 mg via SUBCUTANEOUS

## 2023-05-29 NOTE — Progress Notes (Signed)
Patient is taking Zytiga as prescribed.  He has not missed any doses and reports no side effects at this time.   

## 2023-05-29 NOTE — Patient Instructions (Addendum)
[facility] Discharge Instructions   You were seen and examined today by Dr. Ellin Saba.  He reviewed the results of your lab work which are normal/stable.   Continue Zytiga and prednisone. Dr. Kirtland Bouchard wants you to decrease the Zytiga to 500 mg daily (two pills a day).    Continue Prolia injections every 6 months.   Cut back on the calcium + D supplements one pill every other day. Your calcium was slightly elevated at 10.6.   We will see you back in    Thank you for choosing [facility] to provide your oncology and hematology care.  To afford each patient quality time with our provider, please arrive at least 15 minutes before your scheduled appointment time.   If you have a lab appointment with the Cancer Center please come in thru the Main Entrance and check in at the main information desk.  You need to re-schedule your appointment should you arrive 10 or more minutes late.  We strive to give you quality time with our providers, and arriving late affects you and other patients whose appointments are after yours.  Also, if you no show three or more times for appointments you may be dismissed from the clinic at the providers discretion.     Again, thank you for choosing [facility]  Our hope is that these requests will decrease the amount of time that you wait before being seen by our physicians.       _____________________________________________________________  Should you have questions after your visit to [facility], please contact our office at [phone] and follow the prompts.  Our office hours are 8:00 a.m. and 4:30 p.m. Monday - Friday.  Please note that voicemails left after 4:00 p.m. may not be returned until the following business day.  We are closed weekends and major holidays.  You do have access to a nurse 24-7, just call the main number to the clinic [phone] and do not  press any options, hold on the line and a nurse will answer the phone.    For prescription refill requests, have your pharmacy contact our office and allow 72 hours.    Due to Covid, you will need to wear a mask upon entering the hospital. If you do not have a mask, a mask will be given to you at the Main Entrance upon arrival. For doctor visits, patients may have 1 support person age 37 or older with them. For treatment visits, patients can not have anyone with them due to social distancing guidelines and our immunocompromised population.

## 2023-05-29 NOTE — Progress Notes (Cosign Needed Addendum)
Firmagon Sub Q Injection  Due to Prostate Cancer patient is present today for a Firmagon Injection.   Medication: Deborra Medina (Degarelix)  Dose: 80mg  Location: right upper abdomen Lot: [id number] Exp: 05/2025  Patient tolerated well, no complications were noted  Performed by: Guss Bunde, CMA  Follow up: Follow up as scheduled.

## 2023-06-02 ENCOUNTER — Other Ambulatory Visit: Payer: Self-pay

## 2023-06-06 ENCOUNTER — Other Ambulatory Visit: Payer: Self-pay

## 2023-06-10 ENCOUNTER — Other Ambulatory Visit: Payer: Self-pay | Admitting: Hematology

## 2023-06-26 ENCOUNTER — Ambulatory Visit (INDEPENDENT_AMBULATORY_CARE_PROVIDER_SITE_OTHER): Payer: Medicare HMO

## 2023-06-26 ENCOUNTER — Other Ambulatory Visit (HOSPITAL_COMMUNITY): Payer: Self-pay

## 2023-06-26 DIAGNOSIS — C61 Malignant neoplasm of prostate: Secondary | ICD-10-CM

## 2023-06-26 MED ORDER — DEGARELIX ACETATE 80 MG ~~LOC~~ SOLR
80.0000 mg | Freq: Once | SUBCUTANEOUS | Status: AC
Start: 2023-06-26 — End: 2023-06-26
  Administered 2023-06-26: 80 mg via SUBCUTANEOUS

## 2023-06-26 NOTE — Progress Notes (Signed)
Firmagon Sub Q Injection  Due to Prostate Cancer patient is present today for a Firmagon Injection.   Medication: Deborra Medina (Degarelix)  Dose: 80mg  Location: left upper abdomen Lot: [id number] Exp: [id number]  Patient tolerated well, no complications were noted  Performed by: Kennyth Lose, CMA  Follow up: Keep scheduled nurse visit

## 2023-06-30 ENCOUNTER — Other Ambulatory Visit (HOSPITAL_COMMUNITY): Payer: Self-pay

## 2023-07-02 ENCOUNTER — Encounter (HOSPITAL_COMMUNITY): Payer: Self-pay

## 2023-07-02 ENCOUNTER — Other Ambulatory Visit (HOSPITAL_COMMUNITY): Payer: Self-pay

## 2023-07-08 ENCOUNTER — Other Ambulatory Visit: Payer: Self-pay

## 2023-07-09 ENCOUNTER — Inpatient Hospital Stay: Payer: Medicare HMO | Attending: Hematology

## 2023-07-09 DIAGNOSIS — C61 Malignant neoplasm of prostate: Secondary | ICD-10-CM | POA: Insufficient documentation

## 2023-07-09 DIAGNOSIS — Z79899 Other long term (current) drug therapy: Secondary | ICD-10-CM | POA: Diagnosis not present

## 2023-07-09 DIAGNOSIS — M810 Age-related osteoporosis without current pathological fracture: Secondary | ICD-10-CM | POA: Diagnosis not present

## 2023-07-09 DIAGNOSIS — C775 Secondary and unspecified malignant neoplasm of intrapelvic lymph nodes: Secondary | ICD-10-CM | POA: Diagnosis not present

## 2023-07-09 DIAGNOSIS — D508 Other iron deficiency anemias: Secondary | ICD-10-CM

## 2023-07-09 LAB — CBC WITH DIFFERENTIAL/PLATELET
Abs Immature Granulocytes: 0.13 10*3/uL — ABNORMAL HIGH (ref 0.00–0.07)
Basophils Absolute: 0.1 10*3/uL (ref 0.0–0.1)
Basophils Relative: 0 %
Eosinophils Absolute: 0.2 10*3/uL (ref 0.0–0.5)
Eosinophils Relative: 1 %
HCT: 36.7 % — ABNORMAL LOW (ref 39.0–52.0)
Hemoglobin: 11.5 g/dL — ABNORMAL LOW (ref 13.0–17.0)
Immature Granulocytes: 1 %
Lymphocytes Relative: 7 %
Lymphs Abs: 0.9 10*3/uL (ref 0.7–4.0)
MCH: 29.3 pg (ref 26.0–34.0)
MCHC: 31.3 g/dL (ref 30.0–36.0)
MCV: 93.4 fL (ref 80.0–100.0)
Monocytes Absolute: 0.7 10*3/uL (ref 0.1–1.0)
Monocytes Relative: 5 %
Neutro Abs: 12.1 10*3/uL — ABNORMAL HIGH (ref 1.7–7.7)
Neutrophils Relative %: 86 %
Platelets: 453 10*3/uL — ABNORMAL HIGH (ref 150–400)
RBC: 3.93 MIL/uL — ABNORMAL LOW (ref 4.22–5.81)
RDW: 15.8 % — ABNORMAL HIGH (ref 11.5–15.5)
WBC: 14.1 10*3/uL — ABNORMAL HIGH (ref 4.0–10.5)
nRBC: 0 % (ref 0.0–0.2)

## 2023-07-09 LAB — COMPREHENSIVE METABOLIC PANEL
ALT: 11 U/L (ref 0–44)
AST: 12 U/L — ABNORMAL LOW (ref 15–41)
Albumin: 3.6 g/dL (ref 3.5–5.0)
Alkaline Phosphatase: 64 U/L (ref 38–126)
Anion gap: 11 (ref 5–15)
BUN: 23 mg/dL (ref 8–23)
CO2: 20 mmol/L — ABNORMAL LOW (ref 22–32)
Calcium: 9.8 mg/dL (ref 8.9–10.3)
Chloride: 106 mmol/L (ref 98–111)
Creatinine, Ser: 1.18 mg/dL (ref 0.61–1.24)
GFR, Estimated: >60 mL/min (ref >60)
Glucose, Bld: 114 mg/dL — ABNORMAL HIGH (ref 70–99)
Potassium: 4.6 mmol/L (ref 3.5–5.1)
Sodium: 137 mmol/L (ref 135–145)
Total Bilirubin: 0.4 mg/dL (ref 0.3–1.2)
Total Protein: 7 g/dL (ref 6.5–8.1)

## 2023-07-09 LAB — MAGNESIUM: Magnesium: 2 mg/dL (ref 1.7–2.4)

## 2023-07-09 LAB — FERRITIN: Ferritin: 51 ng/mL (ref 24–336)

## 2023-07-09 LAB — IRON AND TIBC
Iron: 67 ug/dL (ref 45–182)
Saturation Ratios: 20 % (ref 17.9–39.5)
TIBC: 343 ug/dL (ref 250–450)
UIBC: 276 ug/dL

## 2023-07-09 LAB — PSA: Prostatic Specific Antigen: 0.01 ng/mL (ref 0.00–4.00)

## 2023-07-09 NOTE — Progress Notes (Signed)
Office Visit    Patient Name: Dan [name] Date of Encounter: 07/10/2023 PCP:  Richardean Chimera, MD [facility]  Cardiologist:  Dina Rich, MD  Advanced Practice Provider:  No care team member to display Electrophysiologist:  None [id number]}  Chief Complaint and HPI    Dan [name] is a 77 y.o. male with a hx of palpitations, PAF, mitral regurgitation, hypertension, hyperlipidemia, history of prostate cancer, BPH, COPD, and depression, who presents today for shortness of breath evaluation.   In 2021, 30-day monitor revealed sinus rhythm with PACs and PVCs, paroxysmal A-fib.  Was unable to tolerate Eliquis due to heavy bruising, changed to Xarelto.  Xarelto caused GI bleeding in the past with admission to [facility].  Declined colonoscopy as inpatient was told to follow-up with outpatient GI.  NST in 2021 showed inferior infarct with minimal ischemia.  Echo in 2021 revealed EF 60 to 65%.  CTA for AAA screen at [facility] was negative for aneurysm, showed aortic atherosclerosis.  Last seen by Dr. Dina Rich 02/2022.  Was not on anticoagulation due to past history of GI bleed.  He was going to have outpatient colonoscopy, but refused.  Denied any recurrent bleeding.  Patient declined to retry Xarelto.  Overall doing well.  I last saw him for scheduled follow-up on February 17, 2023.  Was overall doing well. Did have some weakness from radiation and treatments for prostate cancer, following Oncology. Denied any chest pain, shortness of breath, palpitations, syncope, presyncope, dizziness, orthopnea, PND, swelling or significant weight changes, acute bleeding, or claudication.  Today he presents with a chief complaint of shortness of breath that is new for him, began after starting his chemo medication (Zytiga), has been ongoing for the last couple of months. His shortness of breath seems to be getting worse over time. He also endorses abdominal swelling, weight gain, and  fatigue. Says he is gaining weight despite eating only 2 meals a day, "not eating a whole lot" per his report. Denies any anginal chest pain. Admits to recent atypical "sting" above his heart, lasting only a few seconds, that he attributes to movement from his regular activities including mowing and doing house projects, saying wonders if it is MSK related. Denies any recent anginal symptoms. Denies any  palpitations, syncope, presyncope, dizziness, orthopnea, PND, acute bleeding, or claudication.  EKGs/Labs/Other Studies Reviewed:   The following studies were reviewed today:  EKG:  EKG is not ordered today.  Myoview 02/2020:  There was no ST segment deviation noted during stress. Findings consistent with prior myocardial inferior/inferoseptal infarction with minimal peri-infarct ischemia. This is an intermediate risk study. Risk based primarily on decreased LVEF, fairly minimal current ischemia. The left ventricular ejection fraction is mildly decreased (45-54%).   Echo 01/2020:   1. Left ventricular ejection fraction, by estimation, is 60 to 65%. The  left ventricle has normal function. The left ventricle has no regional  wall motion abnormalities. Left ventricular diastolic parameters are  consistent with Grade I diastolic  dysfunction (impaired relaxation).   2. Right ventricular systolic function is normal. The right ventricular  size is normal. There is normal pulmonary artery systolic pressure.   3. The mitral valve is degenerative. Mild to moderate mitral valve  regurgitation.   4. The aortic valve is tricuspid. Aortic valve regurgitation is not  visualized. No aortic stenosis is present.   5. The inferior vena cava is normal in size with greater than 50%  respiratory variability, suggesting  right atrial pressure of 3 mmHg.  Monitor 02/2020:  Sinus rhythm with frequent PACs and rare PVCs with paroxysms of possible atrial fibrillation.   Risk Assessment/Calculations:    CHA2DS2-VASc Score = 3   This indicates a 3.2% annual risk of stroke. The patient's score is based upon: CHF History: 0 HTN History: 1 Diabetes History: 0 Stroke History: 0 Vascular Disease History: 0 Age Score: 2 Gender Score: 0  Review of Systems   All other systems reviewed and are otherwise negative except as noted above.  Physical Exam    VS:  BP 130/60   Pulse 95   SpO2 98% Comment: after resting , BMI There is no height or weight on file to calculate BMI.  Wt Readings from Last 3 Encounters:  05/29/23 148 lb 4.8 oz (67.3 kg)  05/22/23 141 lb 1.6 oz (64 kg)  03/27/23 141 lb 6.4 oz (64.1 kg)    O2 sat after walking from waiting room to exam room initially 90%, 98% after resting.  GEN: Thin, 77 year old male in acute distress. Neck: Supple, minimal JVD, no carotid bruits, or masses. Cardiac: S1/S2, overall RRR, no murmurs, rubs, or gallops. No clubbing, cyanosis. Abdominal swelling noted, rounded abdomen.  Radials/PT 2+ and equal bilaterally.  Respiratory:  Respirations labored, tachypnea noted at rest. Diminished wheezing bilaterally and throughout with diminished anterior fine crackles noted along anterior chest. Short of breath while speaking during interview. MS: No deformity or atrophy. Skin: Warm and dry, no rash. Neuro:  Strength and sensation are intact. Psych: Normal affect.  Assessment & Plan    Shortness of breath, fluid retention, most likely possible acute decompensated congestive heart failure? Patient is showing clinical signs and symptoms today of most likely acute decompensated congestive heart failure as leading diagnosis - see HPI and PE above, however he also could be experiencing side effect of his chemo medication. I strongly recommended and advised patient to go to [facility] based on his presentation for further evaluation and care, patient politely declined and requested to do OP care. Discussed risks of delayed care and not going to ED and he  verbalized understanding. Will arrange a Echocardiogram for further evaluation, a 2 view CXR, and will start patient on Lasix 40 mg daily based on his symptoms of volume overload and abdominal swelling. Will obtain BMET, Mag, and BNP in 1 week. Continue potassium supplement. Continue losartan. Low sodium diet, fluid restriction <2L, and daily weights encouraged. Educated to contact our office for weight gain of 2 lbs overnight or 5 lbs in one week. He verbalized understanding. ED precautions discussed.  2. Atypical chest pain His recent symptom sounds MSK related as he was moving objects/doing house projects. NST in 2021 showed findings consistent with prior inferior/inferoseptal infarction with minimal peri-infarct ischemia.  No indication for ischemic evaluation at this time. Once breathing is improved/stable, can consider repeating NST if indicated. Will continue to monitor for now. ED precautions discussed. He verbalized understanding. Continue to follow with PCP. Recommended Tylenol PRN for MSK symptoms.   3. PAF, hx of palpitations Denies any tachycardia or palpitations.  No longer on anticoagulation due to history of bleeding, pt has declined OAC. If workup reveals evidence or confirmation of CHF, will need to stop diltiazem and would recommend beginning beta blocker such as Metoprolol Succinate/ Carvedilol. Heart healthy diet and regular cardiovascular exercise encouraged.   4. Mitral regurgitation Last echocardiogram revealed normal EF, mild to moderate MR.   Updating Echo at this time.   5.  Hypertension Blood pressure well-controlled. Discussed to monitor BP at home at least 2 hours after medications and sitting for 5-10 minutes.   Heart healthy diet and regular cardiovascular exercise encouraged. Getting labs as mentioned above.  6. Prostate cancer  Continue to follow-up with Oncology as scheduled.   Disposition: I strongly encouraged patient to go to the ED that day. He politely declined  to go to the ED. Will do close outpatient follow-up. ED precautions discussed. Follow up for first available appt with Dina Rich, MD or APP.  Signed, Sharlene Dory, NP

## 2023-07-10 ENCOUNTER — Encounter: Payer: Self-pay | Admitting: Nurse Practitioner

## 2023-07-10 ENCOUNTER — Ambulatory Visit: Payer: Medicare HMO | Attending: Nurse Practitioner | Admitting: Nurse Practitioner

## 2023-07-10 VITALS — BP 130/60 | HR 95

## 2023-07-10 DIAGNOSIS — R609 Edema, unspecified: Secondary | ICD-10-CM | POA: Diagnosis not present

## 2023-07-10 DIAGNOSIS — I10 Essential (primary) hypertension: Secondary | ICD-10-CM | POA: Diagnosis not present

## 2023-07-10 DIAGNOSIS — I340 Nonrheumatic mitral (valve) insufficiency: Secondary | ICD-10-CM

## 2023-07-10 DIAGNOSIS — I480 Paroxysmal atrial fibrillation: Secondary | ICD-10-CM

## 2023-07-10 DIAGNOSIS — R0789 Other chest pain: Secondary | ICD-10-CM | POA: Diagnosis not present

## 2023-07-10 DIAGNOSIS — Z9189 Other specified personal risk factors, not elsewhere classified: Secondary | ICD-10-CM | POA: Diagnosis not present

## 2023-07-10 DIAGNOSIS — R0602 Shortness of breath: Secondary | ICD-10-CM

## 2023-07-10 DIAGNOSIS — C775 Secondary and unspecified malignant neoplasm of intrapelvic lymph nodes: Secondary | ICD-10-CM | POA: Diagnosis not present

## 2023-07-10 DIAGNOSIS — C61 Malignant neoplasm of prostate: Secondary | ICD-10-CM

## 2023-07-10 DIAGNOSIS — Z79899 Other long term (current) drug therapy: Secondary | ICD-10-CM

## 2023-07-10 DIAGNOSIS — Z87898 Personal history of other specified conditions: Secondary | ICD-10-CM | POA: Diagnosis not present

## 2023-07-10 DIAGNOSIS — I5032 Chronic diastolic (congestive) heart failure: Secondary | ICD-10-CM

## 2023-07-10 MED ORDER — FUROSEMIDE 40 MG PO TABS: 40.0000 mg | ORAL_TABLET | Freq: Every day | ORAL | 3 refills | Status: DC

## 2023-07-10 NOTE — Patient Instructions (Addendum)
Medication Instructions:  Your physician has recommended you make the following change in your medication:  Start taking lasix 40 Mg daily  Continue all other medications as prescribed   Labwork: BMET, BNP, MAG in 7 days   Testing/Procedures: Your physician has requested that you have an echocardiogram. Echocardiography is a painless test that uses sound waves to create images of your heart. It provides your doctor with information about the size and shape of your heart and how well your heart's chambers and valves are working. This procedure takes approximately one hour. There are no restrictions for this procedure. Please do NOT wear cologne, perfume, aftershave, or lotions (deodorant is allowed). Please arrive 15 minutes prior to your appointment time.  Follow-Up: Your physician recommends that you schedule a follow-up appointment in: First available with [facility]   Any Other Special Instructions Will Be Listed Below (If Applicable).  If you need a refill on your cardiac medications before your next appointment, please call your pharmacy.

## 2023-07-11 NOTE — Progress Notes (Signed)
Patient sent my Chart message after seeing Cardiologist today and they started him on Lasix due to heart failure possibly from zytiga.    Doreatha Massed, MD made aware. He states that he wants patient to continue taking the Zytiga. He states that Zytiga does not cause heart failure but can cause fluid retention.   Left detailed message on patient's answering machine to continue taking the Zytiga and to call us back with any questions.

## 2023-07-13 ENCOUNTER — Telehealth: Payer: Self-pay | Admitting: Nurse Practitioner

## 2023-07-13 NOTE — Telephone Encounter (Addendum)
Called and s/w patient's wife (okay per DPR) - stated patient was laying down sleeping at the time of telephone call. I asked for update about how patient is doing.  Wife stated that he has been taking furosemide and feels like "it is not making much of a difference."  Has been eating peanut butter M&M's per wife's report. Wife also says he is only drinking about 1 water bottle per day, and does not seem to be going to the bathroom much. Encouraged wife he can drink up to 64 fluid ounces in 24 hours.   Discussed/reviewed with patient's wife his presentation, symptoms, and my suggestion/recommendations during most recent office visit with me. I explained that I strongly advised patient to go to the ED that day based on his presentation and how patient politely declined. Stated I discussed risks with him the day of the office visit of delayed ED presentation.   Reviewed patient's recent MyChart message sent to Oncology team (07/10/2023) and Dr. Marice Potter recommendations. Discussed with patient's wife that patient presented with signs and symptoms of heart failure the day of office visit and more testing is required and has already been ordered to confirm this diagnosis. Provided reassurance to wife based on documentation note on 07/11/2023 that per Dr. Ellin Saba, "Zytiga does not cause heart failure but can cause fluid retention." I discussed with patient's wife that this is one of the signs/symptoms of heart failure, and Lasix is a good medication and frequently prescribed medication to help with fluid retention.  Wife then states, "He told me he was surprised you didn't prescribe him Nitroglycerin." Discussed with patient's wife that his presentation of chest pain was atypical the day I saw him in office 07/10/2023 and sounded MSK as he was moving objects/doing housework and NTG is not prescribed for musculoskeletal symptoms. Recommended Tylenol PRN during office visit. Will continue to monitor this. Care and ED  precautions discussed with wife and she verbalized understanding.   Discussed/ recommended to monitor symptoms, will proceed with testing as scheduled, follow-up with Oncology as scheduled, and to notify our office if symptoms don't improve in next few days. She verbalized understanding. Care and ED precautions were discussed.  She verbalized understanding of our phone call discussion and was appreciative of my call.   Sharlene Dory, NP

## 2023-07-14 ENCOUNTER — Inpatient Hospital Stay: Payer: Medicare HMO | Admitting: Hematology

## 2023-07-14 ENCOUNTER — Other Ambulatory Visit (HOSPITAL_COMMUNITY): Payer: Self-pay

## 2023-07-14 ENCOUNTER — Ambulatory Visit (HOSPITAL_COMMUNITY)
Admission: RE | Admit: 2023-07-14 | Discharge: 2023-07-14 | Disposition: A | Discharge status: Home / Self Care | Payer: Medicare HMO | Source: Ambulatory Visit | Attending: Nurse Practitioner | Admitting: Nurse Practitioner

## 2023-07-14 VITALS — BP 137/61 | HR 74 | Temp 98.0°F | Resp 18 | Wt 148.5 lb

## 2023-07-14 DIAGNOSIS — R0602 Shortness of breath: Secondary | ICD-10-CM | POA: Insufficient documentation

## 2023-07-14 DIAGNOSIS — C61 Malignant neoplasm of prostate: Secondary | ICD-10-CM

## 2023-07-14 DIAGNOSIS — C775 Secondary and unspecified malignant neoplasm of intrapelvic lymph nodes: Secondary | ICD-10-CM

## 2023-07-14 DIAGNOSIS — I5032 Chronic diastolic (congestive) heart failure: Secondary | ICD-10-CM | POA: Diagnosis not present

## 2023-07-14 DIAGNOSIS — J449 Chronic obstructive pulmonary disease, unspecified: Secondary | ICD-10-CM | POA: Diagnosis not present

## 2023-07-14 DIAGNOSIS — Z79899 Other long term (current) drug therapy: Secondary | ICD-10-CM | POA: Diagnosis not present

## 2023-07-14 DIAGNOSIS — D508 Other iron deficiency anemias: Secondary | ICD-10-CM

## 2023-07-14 DIAGNOSIS — M810 Age-related osteoporosis without current pathological fracture: Secondary | ICD-10-CM | POA: Diagnosis not present

## 2023-07-14 DIAGNOSIS — I771 Stricture of artery: Secondary | ICD-10-CM | POA: Diagnosis not present

## 2023-07-14 NOTE — Patient Instructions (Addendum)
[facility]  Discharge Instructions  You were seen and examined today by Dr. Ellin Saba.  Dr. Ellin Saba discussed your most recent lab work and CT scan which revealed that everything looks good and stable except your iron levels are slightly low.  Dr. Ellin Saba recommends that you have IV iron infusion.  Follow-up as scheduled.    Thank you for choosing [facility] to provide your oncology and hematology care.   To afford each patient quality time with our provider, please arrive at least 15 minutes before your scheduled appointment time. You may need to reschedule your appointment if you arrive late (10 or more minutes). Arriving late affects you and other patients whose appointments are after yours.  Also, if you miss three or more appointments without notifying the office, you may be dismissed from the clinic at the provider's discretion.    Again, thank you for choosing [facility]  Our hope is that these requests will decrease the amount of time that you wait before being seen by our physicians.   If you have a lab appointment with the Cancer Center - please note that after April 8th, all labs will be drawn in the cancer center.  You do not have to check in or register with the main entrance as you have in the past but will complete your check-in at the cancer center.            _____________________________________________________________  Should you have questions after your visit to [facility], please contact our office at [phone] and follow the prompts.  Our office hours are 8:00 a.m. to 4:30 p.m. Monday - Thursday and 8:00 a.m. to 2:30 p.m. Friday.  Please note that voicemails left after 4:00 p.m. may not be returned until the following business day.  We are closed weekends and all major holidays.  You do have access to a nurse 24-7, just call the main number to the clinic [phone] and do  not press any options, hold on the line and a nurse will answer the phone.    For prescription refill requests, have your pharmacy contact our office and allow 72 hours.    Masks are no longer required in the cancer centers. If you would like for your care team to wear a mask while they are taking care of you, please let them know. You may have one support person who is at least 76 years old accompany you for your appointments.

## 2023-07-14 NOTE — Progress Notes (Signed)
[facility] [address], [place] [address]    Clinic Day:  05/29/2023  Referring physician: Richardean Chimera, MD  Patient Care Team: Richardean Chimera, MD as PCP - General (Family Medicine) Wyline Mood Dorothe Pea, MD as PCP - Cardiology (Cardiology) Jena Gauss Gerrit Friends, MD as Consulting Physician (Gastroenterology) Doreatha Massed, MD as Medical Oncologist (Medical Oncology) Therese Sarah, RN as Oncology Nurse Navigator (Medical Oncology)   ASSESSMENT & PLAN:   Assessment: 1.  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 Ileene Hutchinson on 09/22/2018 - Final pathology: PT3b N0, Gleason 4+3 prostate adenocarcinoma, postoperative PSA remained detectable and rose to 5.7 - Axumin PET (06/2019): Local recurrence - Salvage XRT to the prostate fossa and pelvic lymph nodes, Dr. Brendolyn Patty from 07/05/2019 - 08/31/2019, without concurrent ADT.  PSA responded well reaching a nadir of 0.1. - PSA rose again to 3.4 in 06/2022, PSMA PET scan: Oligometastatic disease involving single internal iliac lymph node. - 07/29/2022 - 08/09/2022: 10 fx course of UHRT to right internal iliac node, PSA only decreased slightly to 3.1 in 10/2022, further went up to 5 on 12/05/2022 - PSMA PET (12/19/2022): New radiotracer avid 2 mm infrarenal retrocaval lymph node and a minimal increase size of the previously treated right common iliac lymph node measuring 3 mm compared to 2 mm previously, no evidence of recurrence in the prostatectomy bed or bone mets. - Loading dose of Firmagon with Dr. Annabell Howells on 01/09/2023 - Abiraterone and prednisone started on 01/18/2023. - XRT to the retrocaval lymph node and right common iliac lymph node completed on 01/29/2023, 10 fractions   2.  Social/family history: - He lives with wife at home.  He is independent of all ADLs and IADLs.  Uses cane for longer distances.  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  Current active smoker, 1 pack/day for 45 years. - Father had lung cancer and was smoker.    Plan: 1.  Metastatic CSPC to abdominal/high pelvic lymph node (low-volume): - At last visit I have decreased the abiraterone to 500 mg daily with prednisone 5 mg daily. - Continues to have some shortness of breath on exertion.  Evaluated by cardiology and echocardiogram was ordered.  Prior echo showed grade 1 diastolic dysfunction. - X-ray from today did not reveal any signs of CHF. - Labs today: Normal LFTs and potassium.  CBC shows leukocytosis and thrombocytosis.  Leukocytosis likely from prednisone.  Hemoglobin is 11.5.  Ferritin is 51 and percent saturation 20. - He complains of fatigue.  He has baseline constipation and cannot tolerate oral iron therapy. - Recommend a trial of parenteral iron therapy to see if it improves his energy levels and breathing.  We talked about INFeD 1 g x 1 dose with premedication.  We discussed side effects including rare chance of anaphylactic reaction.  He is agreeable to proceed with treatment. - Continue abiraterone 500 mg daily with prednisone 5 mg daily.  PSA has improved to 0.01. - RTC 3 months for follow-up with repeat labs.  Continue monthly degarelix.   2.  Hypomagnesemia: - Continue magnesium daily.  Magnesium is normal.   3.  Mild hypercalcemia: - Continue calcium 1 tablet every other day.  Calcium today is 9.8.  4.  Osteoporosis (DEXA on 03/17/2023 T score -4): - First Prolia injection on 04/03/2023.  Continue Prolia every 6 months.  No orders of the defined types were placed in this encounter.     Dan [name],acting as a Neurosurgeon for Doreatha Massed, MD.,have documented all relevant documentation on the behalf of Doreatha Massed, MD,as directed by  Doreatha Massed, MD while in the presence of Doreatha Massed, MD.  I, Doreatha Massed MD, have reviewed the above documentation for accuracy  and completeness, and I agree with the above.    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 RALP 09/22/18 (Dr. Ileene Hutchinson) 2. Salvage XRT + ADT 07/05/2019 - 08/31/2019 (Dr. Brendolyn Patty) 3. UHRT to right internal iliac node 07/29/22 - 08/09/22 4. UHRT to infrarenal retrocaval lymph node 01/16/23 - 01/29/23  Current Therapy:  Deborra Medina and Abiraterone    HISTORY OF PRESENT ILLNESS:   Oncology History   No history exists.     INTERVAL HISTORY:   Dan [name] is a 77 y.o. 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  He is tolerating abiraterone 500 mg daily with prednisone 5 mg daily very well.  Reports energy levels of 10% and appetite at 50%.  Reports dyspnea on exertion.  Chronic constipation is stable.  PAST MEDICAL HISTORY:   Past Medical History: Past Medical History:  Diagnosis Date   BPH (benign prostatic hyperplasia)    COPD (chronic obstructive pulmonary disease) (HCC)    Depression    ED (erectile dysfunction)    Gout    Hyperlipidemia    Hypertension    Loss of hearing    Nocturia    Paroxysmal atrial fibrillation (HCC)    Prostate cancer (HCC)     Surgical History: Past Surgical History:  Procedure Laterality Date   CARDIOVASCULAR STRESS TEST  12/31/2004   normal perfuction nuclear study w/ no evidence ischemia /  normal LV function and wall motion , ef 52%   PROSTATECTOMY  2019   TOTAL HIP ARTHROPLASTY      Social History: Social History   Socioeconomic History   Marital status: Married    Spouse name: Not on file   Number of children: Not on file   Years of  education: Not on file   Highest education level: Not on file  Occupational History   Not on file  Tobacco Use   Smoking status: Every Day    Types: Cigarettes    Start date: 06/12/1967    Passive exposure: Never   Smokeless tobacco: Never  Vaping Use   Vaping status: Never Used  Substance and Sexual Activity   Alcohol use: Not Currently   Drug use: Not Currently   Sexual activity: Not on file  Other Topics Concern   Not on file  Social History Narrative   Not on file   Social Determinants of Health   Financial Resource Strain: Not on file  Food Insecurity: No Food Insecurity (01/14/2023)   Hunger Vital Sign    Worried About Running Out of Food in the Last Year: Never true    Ran Out of Food in the Last Year: Never true  Transportation Needs: No Transportation Needs (01/14/2023)   PRAPARE - Administrator, Civil Service (Medical):  No    Lack of Transportation (Non-Medical): No  Physical Activity: Not on file  Stress: Not on file  Social Connections: Unknown (03/18/2022)   [facility]   Social Network    Social Network: Not on file  Intimate Partner Violence: Not At Risk (01/14/2023)   Humiliation, Afraid, Rape, and Kick questionnaire    Fear of Current or Ex-Partner: No    Emotionally Abused: No    Physically Abused: No    Sexually Abused: No    Family History: Family History  Problem Relation Age of Onset   Hypertension Mother    Cancer Father        lung   Colon cancer Neg Hx    Inflammatory bowel disease Neg Hx    Pancreatic cancer Neg Hx     Current Medications:  Current Outpatient Medications:    abiraterone acetate (ZYTIGA) 250 MG tablet, Take 4 tablets (1,000 mg total) by mouth daily. Take on an empty stomach 1 hour before or 2 hours after a meal, Disp: 120 tablet, Rfl: 3   albuterol (PROVENTIL HFA;VENTOLIN HFA) 108 (90 Base) MCG/ACT inhaler, Inhale 2 puffs into the lungs every 6 (six) hours as needed for wheezing  or shortness of breath., Disp: , Rfl:    albuterol (PROVENTIL) (2.5 MG/3ML) 0.083% nebulizer solution, Take 2.5 mg by nebulization every 6 (six) hours as needed., Disp: , Rfl:    allopurinol (ZYLOPRIM) 100 MG tablet, Take 100 mg by mouth daily., Disp: , Rfl:    Ascorbic Acid (VITAMIN C) 1000 MG tablet, Take 1,000 mg by mouth daily., Disp: , Rfl:    aspirin EC 81 MG tablet, Take 81 mg by mouth daily. Swallow whole., Disp: , Rfl:    cholecalciferol (VITAMIN D3) 25 MCG (1000 UNIT) tablet, Take 1,000 Units by mouth daily., Disp: , Rfl:    colchicine 0.6 MG tablet, Take 1 tablet by mouth daily as needed., Disp: , Rfl:    diltiazem (CARDIZEM CD) 360 MG 24 hr capsule, Take by mouth daily., Disp: , Rfl:    furosemide (LASIX) 40 MG tablet, Take 1 tablet (40 mg total) by mouth daily., Disp: 90 tablet, Rfl: 3   HYDROcodone-acetaminophen (NORCO) 10-325 MG tablet, Take 1 tablet by mouth 4 (four) times daily as needed., Disp: , Rfl:    losartan (COZAAR) 25 MG tablet, TAKE 1 TABLET (25 MG TOTAL) BY MOUTH DAILY., Disp: 90 tablet, Rfl: 2   magnesium oxide (MAG-OX) 400 (240 Mg) MG tablet, TAKE 1 TABLET BY MOUTH EVERY DAY, Disp: 90 tablet, Rfl: 1   megestrol (MEGACE) 40 MG/ML suspension, Take by mouth., Disp: , Rfl:    potassium chloride (MICRO-K) 10 MEQ CR capsule, TAKE 2 CAPSULES BY MOUTH DAILY., Disp: 7 capsule, Rfl: 0   predniSONE (DELTASONE) 5 MG tablet, Take 10 mg by mouth daily., Disp: , Rfl:    predniSONE (DELTASONE) 5 MG tablet, Take 1 tablet (5 mg total) by mouth daily with breakfast., Disp: 90 tablet, Rfl: 1   TRELEGY ELLIPTA 200-62.5-25 MCG/ACT AEPB, Inhale 1 puff into the lungs daily., Disp: , Rfl:    Zinc 50 MG TABS, Take 1 tablet by mouth daily., Disp: , Rfl:    Allergies: Allergies  Allergen Reactions   Tamsulosin Hives and Itching    REVIEW OF SYSTEMS:   Review of Systems  Constitutional:  Negative for chills, fatigue and fever.  HENT:   Negative for lump/mass, mouth sores, nosebleeds,  sore throat and trouble swallowing.  Eyes:  Negative for eye problems.  Respiratory:  Positive for shortness of breath. Negative for cough.   Cardiovascular:  Negative for chest pain, leg swelling and palpitations.  Gastrointestinal:  Positive for constipation. Negative for abdominal pain, diarrhea, nausea and vomiting.  Genitourinary:  Negative for bladder incontinence, difficulty urinating, dysuria, frequency, hematuria and nocturia.   Musculoskeletal:  Negative for arthralgias, back pain, flank pain, myalgias and neck pain.  Skin:  Negative for itching and rash.  Neurological:  Negative for dizziness, headaches and numbness.  Hematological:  Does not bruise/bleed easily.  Psychiatric/Behavioral:  Positive for sleep disturbance. Negative for depression and suicidal ideas. The patient is not nervous/anxious.   All other systems reviewed and are negative.    VITALS:   Blood pressure 137/61, pulse 74, temperature 98 F (36.7 C), temperature source Oral, resp. rate 18, weight 148 lb 8 oz (67.4 kg), SpO2 100%.  Wt Readings from Last 3 Encounters:  07/14/23 148 lb 8 oz (67.4 kg)  05/29/23 148 lb 4.8 oz (67.3 kg)  05/22/23 141 lb 1.6 oz (64 kg)    Body mass index is 21.01 kg/m.  Performance status (ECOG): 1 - Symptomatic but completely ambulatory  PHYSICAL EXAM:   Physical Exam Vitals and nursing note reviewed. Exam conducted with a chaperone present.  Constitutional:      Appearance: Normal appearance.  Cardiovascular:     Rate and Rhythm: Normal rate and regular rhythm.     Pulses: Normal pulses.     Heart sounds: Normal heart sounds.  Pulmonary:     Effort: Pulmonary effort is normal.     Breath sounds: Normal breath sounds.  Abdominal:     Palpations: Abdomen is soft. There is no hepatomegaly, splenomegaly or mass.     Tenderness: There is no abdominal tenderness.  Musculoskeletal:     Right lower leg: No edema.     Left lower leg: No edema.  Lymphadenopathy:      Cervical: No cervical adenopathy.     Right cervical: No superficial, deep or posterior cervical adenopathy.    Left cervical: No superficial, deep or posterior cervical adenopathy.     Upper Body:     Right upper body: No supraclavicular or axillary adenopathy.     Left upper body: No supraclavicular or axillary adenopathy.  Neurological:     General: No focal deficit present.     Mental Status: He is alert and oriented to person, place, and time.  Psychiatric:        Mood and Affect: Mood normal.        Behavior: Behavior normal.     LABS:      Latest Ref Rng & Units 07/09/2023   10:56 AM 05/23/2023   10:53 AM 03/20/2023   10:16 AM  CBC  WBC 4.0 - 10.5 K/uL 14.1  16.5  10.7   Hemoglobin 13.0 - 17.0 g/dL 40.9  81.1  91.4   Hematocrit 39.0 - 52.0 % 36.7  36.6  36.5   Platelets 150 - 400 K/uL 453  435  331       Latest Ref Rng & Units 07/09/2023   10:56 AM 05/23/2023   10:53 AM 03/20/2023   10:16 AM  CMP  Glucose 70 - 99 mg/dL 782  89  956   BUN 8 - 23 mg/dL 23  27  15    Creatinine 0.61 - 1.24 mg/dL 2.13  0.86  5.78   Sodium 135 - 145 mmol/L 137  136  129  Potassium 3.5 - 5.1 mmol/L 4.6  4.5  4.4   Chloride 98 - 111 mmol/L 106  107  99   CO2 22 - 32 mmol/L 20  20  22    Calcium 8.9 - 10.3 mg/dL 9.8  78.2  95.6   Total Protein 6.5 - 8.1 g/dL 7.0  7.3  7.0   Total Bilirubin 0.3 - 1.2 mg/dL 0.4  0.5  0.7   Alkaline Phos 38 - 126 U/L 64  53  72   AST 15 - 41 U/L 12  14  16    ALT 0 - 44 U/L 11  13  15       No results found for: "CEA1", "CEA" / No results found for: "CEA1", "CEA" Lab Results  Component Value Date   PSA1 <0.1 05/15/2023   No results found for: "OZH086" No results found for: "CAN125"  No results found for: "TOTALPROTELP", "ALBUMINELP", "A1GS", "A2GS", "BETS", "BETA2SER", "GAMS", "MSPIKE", "SPEI" Lab Results  Component Value Date   TIBC 343 07/09/2023   FERRITIN 51 07/09/2023   IRONPCTSAT 20 07/09/2023   No results found for: "LDH"   STUDIES:   No  results found.

## 2023-07-15 ENCOUNTER — Ambulatory Visit: Payer: Medicare HMO | Attending: Nurse Practitioner

## 2023-07-15 ENCOUNTER — Encounter: Payer: Self-pay | Admitting: Nurse Practitioner

## 2023-07-15 DIAGNOSIS — R0602 Shortness of breath: Secondary | ICD-10-CM

## 2023-07-15 DIAGNOSIS — I5030 Unspecified diastolic (congestive) heart failure: Secondary | ICD-10-CM

## 2023-07-15 DIAGNOSIS — I340 Nonrheumatic mitral (valve) insufficiency: Secondary | ICD-10-CM | POA: Diagnosis not present

## 2023-07-15 LAB — BASIC METABOLIC PANEL
BUN/Creatinine Ratio: 20 (ref 10–24)
BUN: 26 mg/dL (ref 8–27)
CO2: 20 mmol/L (ref 20–29)
Calcium: 10.2 mg/dL (ref 8.6–10.2)
Chloride: 98 mmol/L (ref 96–106)
Creatinine, Ser: 1.28 mg/dL — ABNORMAL HIGH (ref 0.76–1.27)
Glucose: 115 mg/dL — ABNORMAL HIGH (ref 70–99)
Potassium: 5.6 mmol/L — ABNORMAL HIGH (ref 3.5–5.2)
Sodium: 133 mmol/L — ABNORMAL LOW (ref 134–144)
eGFR: 58 mL/min/{1.73_m2} — ABNORMAL LOW (ref >59)

## 2023-07-15 LAB — BRAIN NATRIURETIC PEPTIDE: BNP: 16.8 pg/mL (ref 0.0–100.0)

## 2023-07-15 LAB — MAGNESIUM: Magnesium: 3.7 mg/dL (ref 1.6–2.3)

## 2023-07-16 ENCOUNTER — Other Ambulatory Visit: Payer: Self-pay

## 2023-07-16 LAB — ECHOCARDIOGRAM COMPLETE
AR max vel: 3.86 cm2
AV Area VTI: 3.52 cm2
AV Area mean vel: 3.26 cm2
AV Mean grad: 7 mmHg
AV Peak grad: 11.4 mmHg
Ao pk vel: 1.69 m/s
Area-P 1/2: 2.53 cm2
Calc EF: 70.1 %
MV VTI: 3.89 cm2
S' Lateral: 2.6 cm
Single Plane A2C EF: 50.9 %
Single Plane A4C EF: 82 %

## 2023-07-16 NOTE — Addendum Note (Signed)
Addended by: Sharen Hones on: 07/16/2023 10:52 AM   Modules accepted: Orders

## 2023-07-17 NOTE — Progress Notes (Signed)
  Radiation Oncology         [phone] ________________________________  Name: Dan [name] MRN: [id number]  Date: 01/29/2023  DOB: Oct 23, 1946  End of Treatment Note  Diagnosis:    77 y/o with oligoprogression of prostate cancer involving an intra-abdominal lymph node with PSA at 5.0, s/p RALP in 2019, salvage radiation with ST-ADT in 08/2019 and recent SBRT right common iliac lymph node in 08/2022.      Indication for treatment:  Curative, Definitive UHRT       Radiation treatment dates:   01/16/23 - 01/29/23  Site/dose:   The PET positive paraaortic node target was treated to 50 Gy in 10 fractions of 5 Gy, 40 Gy in 10 fractions to the previously treated PET avid pelvic node and 30 Gy in 10 fractions to the nodal echelon stations involved.   Beams/energy:   The patient was treated using stereotactic body radiotherapy according to a 3D conformal radiotherapy plan.  Volumetric arc fields were employed to deliver 6 MV X-rays.  Image guidance was performed with per fraction cone beam CT prior to treatment under personal MD supervision.  Immobilization was achieved using BodyFix Pillow.  Narrative: The patient tolerated radiation treatment relatively well with modest fatigue.  Plan: The patient will receive a call in about one month from the radiation oncology department. He will continue follow up with Dr. Annabell Howells and Dr. Ellin Saba as well.  ________________________________  Artist Pais. Kathrynn Running, M.D.

## 2023-07-18 ENCOUNTER — Other Ambulatory Visit (HOSPITAL_COMMUNITY): Payer: Self-pay

## 2023-07-18 ENCOUNTER — Encounter (HOSPITAL_COMMUNITY): Payer: Self-pay

## 2023-07-21 ENCOUNTER — Other Ambulatory Visit (HOSPITAL_COMMUNITY): Payer: Self-pay

## 2023-07-22 ENCOUNTER — Encounter: Payer: Self-pay | Admitting: Nurse Practitioner

## 2023-07-22 ENCOUNTER — Ambulatory Visit: Payer: Medicare HMO | Attending: Nurse Practitioner | Admitting: Nurse Practitioner

## 2023-07-22 ENCOUNTER — Telehealth: Payer: Self-pay | Admitting: Nurse Practitioner

## 2023-07-22 VITALS — BP 128/58 | Wt 146.0 lb

## 2023-07-22 DIAGNOSIS — J449 Chronic obstructive pulmonary disease, unspecified: Secondary | ICD-10-CM

## 2023-07-22 DIAGNOSIS — R609 Edema, unspecified: Secondary | ICD-10-CM

## 2023-07-22 DIAGNOSIS — R0602 Shortness of breath: Secondary | ICD-10-CM | POA: Diagnosis not present

## 2023-07-22 DIAGNOSIS — C61 Malignant neoplasm of prostate: Secondary | ICD-10-CM

## 2023-07-22 DIAGNOSIS — I340 Nonrheumatic mitral (valve) insufficiency: Secondary | ICD-10-CM | POA: Diagnosis not present

## 2023-07-22 DIAGNOSIS — I480 Paroxysmal atrial fibrillation: Secondary | ICD-10-CM

## 2023-07-22 DIAGNOSIS — R1900 Intra-abdominal and pelvic swelling, mass and lump, unspecified site: Secondary | ICD-10-CM | POA: Diagnosis not present

## 2023-07-22 DIAGNOSIS — I10 Essential (primary) hypertension: Secondary | ICD-10-CM | POA: Diagnosis not present

## 2023-07-22 DIAGNOSIS — Z79899 Other long term (current) drug therapy: Secondary | ICD-10-CM | POA: Diagnosis not present

## 2023-07-22 DIAGNOSIS — R944 Abnormal results of kidney function studies: Secondary | ICD-10-CM

## 2023-07-22 DIAGNOSIS — C775 Secondary and unspecified malignant neoplasm of intrapelvic lymph nodes: Secondary | ICD-10-CM

## 2023-07-22 DIAGNOSIS — I5032 Chronic diastolic (congestive) heart failure: Secondary | ICD-10-CM

## 2023-07-22 NOTE — Patient Instructions (Signed)
Medication Instructions:  Your physician recommends that you continue on your current medications as directed. Please refer to the Current Medication list given to you today.  Labwork: BMET and MAG 1 week at [facility]   Testing/Procedures: None  Follow-Up: Your physician recommends that you schedule a follow-up appointment in: As planned   Any Other Special Instructions Will Be Listed Below (If Applicable).  If you need a refill on your cardiac medications before your next appointment, please call your pharmacy.

## 2023-07-22 NOTE — Progress Notes (Unsigned)
Virtual Visit via Telephone Note   Because of Dan [name]'s co-morbid illnesses, he is at least at moderate risk for complications without adequate follow up.  This format is felt to be most appropriate for this patient at this time.  The patient did not have access to video technology/had technical difficulties with video requiring transitioning to audio format only (telephone).  All issues noted in this document were discussed and addressed.  No physical exam could be performed with this format.  Please refer to the patient's chart for his consent to telehealth for [facility]    Date:  07/22/2023  ID:  Dan [name], DOB [birth year], MRN [id number] The patient was identified using 2 identifiers.  Patient Location: Home Provider Location: Office/Clinic   PCP:  Richardean Chimera, MD   [facility] Providers Cardiologist:  Dina Rich, MD     Evaluation Performed:  Follow-Up Visit  Chief Complaint:  Shortness of breath follow-up  History of Present Illness:    Dan [name] is a 77 y.o. male with a PMH of palpitations, PAF, mitral regurgitation, hypertension, hyperlipidemia, history of prostate cancer, BPH, COPD, and depression, who presents today for shortness of breath follow-up via telephone visit.    In 2021, 30-day monitor revealed sinus rhythm with PACs and PVCs, paroxysmal A-fib.  Was unable to tolerate Eliquis due to heavy bruising, changed to Xarelto.  Xarelto caused GI bleeding in the past with admission to [facility].  Declined colonoscopy as inpatient was told to follow-up with outpatient GI.  NST in 2021 showed inferior infarct with minimal ischemia.  Echo in 2021 revealed EF 60 to 65%.  CTA for AAA screen at [facility] was negative for aneurysm, showed aortic atherosclerosis.   Last seen by Dr. Dina Rich 02/2022.  Was not on anticoagulation due to past history of GI bleed.  He was going to have outpatient colonoscopy, but refused.  Denied any  recurrent bleeding.  Patient declined to retry Xarelto.  Overall doing well.   I saw him for scheduled follow-up on February 17, 2023.  Was overall doing well. Did have some weakness from radiation and treatments for prostate cancer, following Oncology. Denied any chest pain, shortness of breath, palpitations, syncope, presyncope, dizziness, orthopnea, PND, swelling or significant weight changes, acute bleeding, or claudication.   07/10/2023 - Presented with a CC of new onset SHOB. Began after starting his chemo medication (Zytiga), ongoing for the last couple of months, SHOB seemed to be getting worse over time. Endorsed abdominal swelling, weight gain, and fatigue. Denied any anginal chest pain. Admitted to recent atypical "sting" above his heart, lasting only a few seconds, that he attributed to movement from his regular activities including mowing and doing house projects, saying wondered if it was MSK related. Denied any recent anginal symptoms. Denied any  palpitations, syncope, presyncope, dizziness, orthopnea, PND, acute bleeding, or claudication. Was started on Lasix. Echo report noted below. Normal BNP. Mag level 3.7, pt advised to go to ED, refused. Advised to stop Mag supplement.   07/22/23 - Here for telephone visit. Shortness of breath remains the same. Abdominal fullness/swelling seems to have improved. Stated that Oncologist was consulted who states common side effect of this medication (Zytiga) is shortness of breath. Denies any weight gain or leg edema. Denies any chest pain, palpitations, syncope, presyncope, dizziness, orthopnea, PND, swelling or significant weight changes, acute bleeding, or claudication. Says his Mag level was elevated as he drank Mag citrate for constipation.  Has no longer been taking Mag supplements recently.   ROS:   Please see the history of present illness.     All other systems reviewed and are negative.   Prior CV studies:   The following studies were reviewed  today:  EKG:  EKG is not ordered today.   Myoview 02/2020:  There was no ST segment deviation noted during stress. Findings consistent with prior myocardial inferior/inferoseptal infarction with minimal peri-infarct ischemia. This is an intermediate risk study. Risk based primarily on decreased LVEF, fairly minimal current ischemia. The left ventricular ejection fraction is mildly decreased (45-54%).   Echo 01/2020:   1. Left ventricular ejection fraction, by estimation, is 60 to 65%. The  left ventricle has normal function. The left ventricle has no regional  wall motion abnormalities. Left ventricular diastolic parameters are  consistent with Grade I diastolic  dysfunction (impaired relaxation).   2. Right ventricular systolic function is normal. The right ventricular  size is normal. There is normal pulmonary artery systolic pressure.   3. The mitral valve is degenerative. Mild to moderate mitral valve  regurgitation.   4. The aortic valve is tricuspid. Aortic valve regurgitation is not  visualized. No aortic stenosis is present.   5. The inferior vena cava is normal in size with greater than 50%  respiratory variability, suggesting right atrial pressure of 3 mmHg.   Monitor 02/2020:  Sinus rhythm with frequent PACs and rare PVCs with paroxysms of possible atrial fibrillation.   Risk Assessment/Calculations:    CHA2DS2-VASc Score = 3   This indicates a 3.2% annual risk of stroke. The patient's score is based upon: CHF History: 0 HTN History: 1 Diabetes History: 0 Stroke History: 0 Vascular Disease History: 0 Age Score: 2 Gender Score: 0   Objective:    Vital Signs:  BP (!) 128/58   Wt 146 lb (66.2 kg)   BMI 20.65 kg/m    Due to nature of today's visit, physical exam was unable to be performed.  ASSESSMENT & PLAN:    Shortness of breath, COPD Recent workup unremarkable for HF. Felt to be experiencing side effect of his chemo medication. 2 view CXR revealed COPD  with mild bronchitic changes. Continues to note abdominal fullness/swelling but improved since being on Lasix. Continue Lasix 40 mg daily. Will obtain BMET and Mag in 1 week.  No other medication changes at this time. Heart healthy diet and regular cardiovascular exercise encouraged. ED precautions discussed. Will refer to Pulmonology for further evaluation.   2. PAF Denies any tachycardia or palpitations.  No longer on anticoagulation due to history of bleeding, pt has declined OAC. No medication changes at this time. Heart healthy diet and regular cardiovascular exercise encouraged.    4. Mitral regurgitation Recent echocardiogram revealed normal EF, mild MR. Plan to update Echo in 5 years or sooner if clinically indicated.    5. Hypertension Blood pressure well-controlled. Discussed to monitor BP at home at least 2 hours after medications and sitting for 5-10 minutes.   Heart healthy diet and regular cardiovascular exercise encouraged.    6. Prostate cancer  Continue to follow-up with Oncology as scheduled.   7. Abdominal swelling, fluid retention, medication management Continues to note abdominal fullness/swelling but improved since being on Lasix. Continue Lasix 40 mg daily. Will obtain BMET and Mag in 1 week.  No other medication changes at this time. Heart healthy diet and regular cardiovascular exercise encouraged. ED precautions discussed.  7. Elevated Magnesium level Most likely in  setting of Mag supplement for constipation. Has not been taking Mag supplement as instructed. Plan to arrange Mag level with BMET in 1 week.     Time:   Today, I have spent 11 minutes with the patient with telehealth technology discussing the above problems.     Medication Adjustments/Labs and Tests Ordered: Current medicines are reviewed at length with the patient today.  Concerns regarding medicines are outlined above.   Tests Ordered: Orders Placed This Encounter  Procedures   Basic Metabolic  Panel (BMET) Magnesium level (MAG)   Ambulatory referral to Pulmonology    Medication Changes: No orders of the defined types were placed in this encounter.   Follow Up:  In Person as scheduled.   Signed, Sharlene Dory, NP  07/23/2023 8:14 PM    [facility]

## 2023-07-22 NOTE — Telephone Encounter (Signed)
  Patient Consent for Virtual Visit        Dan [name] has provided verbal consent on 07/22/2023 for a virtual visit (video or telephone).   CONSENT FOR VIRTUAL VISIT FOR:  Dan [name]  By participating in this virtual visit I agree to the following:  I hereby voluntarily request, consent and authorize [facility] and its employed or contracted physicians, physician assistants, nurse practitioners or other licensed health care professionals (the Practitioner), to provide me with telemedicine health care services (the "Services") as deemed necessary by the treating Practitioner. I acknowledge and consent to receive the Services by the Practitioner via telemedicine. I understand that the telemedicine visit will involve communicating with the Practitioner through live audiovisual communication technology and the disclosure of certain medical information by electronic transmission. I acknowledge that I have been given the opportunity to request an in-person assessment or other available alternative prior to the telemedicine visit and am voluntarily participating in the telemedicine visit.  I understand that I have the right to withhold or withdraw my consent to the use of telemedicine in the course of my care at any time, without affecting my right to future care or treatment, and that the Practitioner or I may terminate the telemedicine visit at any time. I understand that I have the right to inspect all information obtained and/or recorded in the course of the telemedicine visit and may receive copies of available information for a reasonable fee.  I understand that some of the potential risks of receiving the Services via telemedicine include:  Delay or interruption in medical evaluation due to technological equipment failure or disruption; Information transmitted may not be sufficient (e.g. poor resolution of images) to allow for appropriate medical decision making by the Practitioner;  and/or  In rare instances, security protocols could fail, causing a breach of personal health information.  Furthermore, I acknowledge that it is my responsibility to provide information about my medical history, conditions and care that is complete and accurate to the best of my ability. I acknowledge that Practitioner's advice, recommendations, and/or decision may be based on factors not within their control, such as incomplete or inaccurate data provided by me or distortions of diagnostic images or specimens that may result from electronic transmissions. I understand that the practice of medicine is not an exact science and that Practitioner makes no warranties or guarantees regarding treatment outcomes. I acknowledge that a copy of this consent can be made available to me via my patient portal [facility] MyChart), or I can request a printed copy by calling the office of [facility].    I understand that my insurance will be billed for this visit.   I have read or had this consent read to me. I understand the contents of this consent, which adequately explains the benefits and risks of the Services being provided via telemedicine.  I have been provided ample opportunity to ask questions regarding this consent and the Services and have had my questions answered to my satisfaction. I give my informed consent for the services to be provided through the use of telemedicine in my medical care

## 2023-07-23 ENCOUNTER — Telehealth: Payer: Medicare HMO | Admitting: Nurse Practitioner

## 2023-07-24 ENCOUNTER — Inpatient Hospital Stay: Payer: Medicare HMO

## 2023-07-24 ENCOUNTER — Other Ambulatory Visit: Payer: Self-pay

## 2023-07-24 ENCOUNTER — Other Ambulatory Visit: Payer: Medicare HMO

## 2023-07-24 NOTE — Telephone Encounter (Signed)
-----   Message from Sharlene Dory sent at 07/23/2023  8:19 PM EDT ----- Genevie Cheshire,   I added a Mag level to the BMET because I want to see if his elevated Mag is coming down. I added this to the order set and the patient is aware. If you don't mind double checking to make sure I did this right.   Thank you!   Best,  Sharlene Dory, NP

## 2023-07-28 ENCOUNTER — Ambulatory Visit (INDEPENDENT_AMBULATORY_CARE_PROVIDER_SITE_OTHER): Payer: Medicare HMO

## 2023-07-28 DIAGNOSIS — C61 Malignant neoplasm of prostate: Secondary | ICD-10-CM

## 2023-07-28 MED ORDER — DEGARELIX ACETATE 80 MG ~~LOC~~ SOLR
80.0000 mg | Freq: Once | SUBCUTANEOUS | Status: AC
Start: 2023-07-28 — End: 2023-07-28
  Administered 2023-07-28: 80 mg via SUBCUTANEOUS

## 2023-07-28 NOTE — Progress Notes (Signed)
Firmagon Sub Q Injection  Due to Prostate Cancer patient is present today for a Firmagon Injection.   Medication: Deborra Medina (Degarelix)  Dose: 80 mg Location: right upper abdomen Lot: [id number] Exp: 06/04/2025  Patient tolerated well, no complications were noted  Performed by: Kennyth Lose, CMA  Follow up: N/A

## 2023-07-30 LAB — MAGNESIUM: Magnesium: 2 mg/dL (ref 1.6–2.3)

## 2023-07-31 DIAGNOSIS — L570 Actinic keratosis: Secondary | ICD-10-CM | POA: Diagnosis not present

## 2023-07-31 DIAGNOSIS — C44622 Squamous cell carcinoma of skin of right upper limb, including shoulder: Secondary | ICD-10-CM | POA: Diagnosis not present

## 2023-07-31 DIAGNOSIS — X32XXXD Exposure to sunlight, subsequent encounter: Secondary | ICD-10-CM | POA: Diagnosis not present

## 2023-08-13 ENCOUNTER — Encounter: Payer: Self-pay | Admitting: Hematology

## 2023-08-14 ENCOUNTER — Other Ambulatory Visit: Payer: Medicare HMO

## 2023-08-14 DIAGNOSIS — Z79899 Other long term (current) drug therapy: Secondary | ICD-10-CM | POA: Diagnosis not present

## 2023-08-14 DIAGNOSIS — C61 Malignant neoplasm of prostate: Secondary | ICD-10-CM

## 2023-08-14 DIAGNOSIS — I480 Paroxysmal atrial fibrillation: Secondary | ICD-10-CM | POA: Diagnosis not present

## 2023-08-14 DIAGNOSIS — C775 Secondary and unspecified malignant neoplasm of intrapelvic lymph nodes: Secondary | ICD-10-CM | POA: Diagnosis not present

## 2023-08-14 DIAGNOSIS — C772 Secondary and unspecified malignant neoplasm of intra-abdominal lymph nodes: Secondary | ICD-10-CM | POA: Diagnosis not present

## 2023-08-14 DIAGNOSIS — I5032 Chronic diastolic (congestive) heart failure: Secondary | ICD-10-CM | POA: Diagnosis not present

## 2023-08-15 LAB — BASIC METABOLIC PANEL
BUN/Creatinine Ratio: 16 (ref 10–24)
BUN: 23 mg/dL (ref 8–27)
CO2: 18 mmol/L — ABNORMAL LOW (ref 20–29)
Calcium: 10.6 mg/dL — ABNORMAL HIGH (ref 8.6–10.2)
Chloride: 98 mmol/L (ref 96–106)
Creatinine, Ser: 1.4 mg/dL — ABNORMAL HIGH (ref 0.76–1.27)
Glucose: 112 mg/dL — ABNORMAL HIGH (ref 70–99)
Potassium: 4.6 mmol/L (ref 3.5–5.2)
Sodium: 137 mmol/L (ref 134–144)
eGFR: 52 mL/min/{1.73_m2} — ABNORMAL LOW (ref >59)

## 2023-08-15 LAB — PSA: Prostate Specific Ag, Serum: <0.1 ng/mL (ref 0.0–4.0)

## 2023-08-15 LAB — BRAIN NATRIURETIC PEPTIDE: BNP: 31.3 pg/mL (ref 0.0–100.0)

## 2023-08-18 ENCOUNTER — Telehealth: Payer: Self-pay

## 2023-08-18 ENCOUNTER — Other Ambulatory Visit: Payer: Self-pay

## 2023-08-18 ENCOUNTER — Other Ambulatory Visit: Payer: Self-pay | Admitting: *Deleted

## 2023-08-18 MED ORDER — PREDNISONE 5 MG PO TABS: 5.0000 mg | ORAL_TABLET | Freq: Every day | ORAL | 1 refills | Status: AC

## 2023-08-18 NOTE — Progress Notes (Signed)
Expected PSA on 12/09 by Dr. Ellin Saba- will notify Dr. Annabell Howells to see if another psa is needed prior to follow up on 01/30.

## 2023-08-18 NOTE — Telephone Encounter (Signed)
Wife is aware of results.  Patient was rescheduled for OV on 01/30.  Dr. Ellin Saba has ordered PSA labs on 12/09.  Will you want more labs drawn closer to OV with you or will you use 12/09 psa labs?

## 2023-08-18 NOTE — Telephone Encounter (Signed)
-----   Message from Bjorn Pippin sent at 08/17/2023 10:18 AM EDT ----- PSA is undetectible.   His is on my schedule for 10/17 when I am out of town. ----- Message ----- From: Interface, [facility] Lab Results In Sent: 08/15/2023   5:38 AM EDT To: Bjorn Pippin, MD

## 2023-08-19 ENCOUNTER — Institutional Professional Consult (permissible substitution): Payer: Medicare HMO | Admitting: Internal Medicine

## 2023-08-21 ENCOUNTER — Ambulatory Visit: Payer: Medicare HMO | Admitting: Urology

## 2023-08-22 NOTE — Addendum Note (Signed)
Addended by: Sharen Hones on: 08/22/2023 08:58 AM   Modules accepted: Orders

## 2023-08-26 ENCOUNTER — Ambulatory Visit (INDEPENDENT_AMBULATORY_CARE_PROVIDER_SITE_OTHER): Payer: Medicare HMO

## 2023-08-26 DIAGNOSIS — C61 Malignant neoplasm of prostate: Secondary | ICD-10-CM

## 2023-08-26 MED ORDER — DEGARELIX ACETATE 80 MG ~~LOC~~ SOLR
80.0000 mg | Freq: Once | SUBCUTANEOUS | Status: AC
Start: 2023-08-26 — End: 2023-08-26
  Administered 2023-08-26: 80 mg via SUBCUTANEOUS

## 2023-08-26 NOTE — Progress Notes (Signed)
Firmagon Sub Q Injection  Due to Prostate Cancer patient is present today for a Firmagon Injection.   Medication: Deborra Medina (Degarelix)  Dose: 80mg  Location: right upper abdomen- pt requested right side, he sleeps on his left Lot: [id number] Exp: 06/2025  Patient tolerated well, no complications were noted  Performed by: Halvor Behrend,CMA  Follow up: as scheduled

## 2023-08-29 ENCOUNTER — Ambulatory Visit: Payer: Medicare HMO | Admitting: Nurse Practitioner

## 2023-09-01 ENCOUNTER — Other Ambulatory Visit: Payer: Self-pay

## 2023-09-01 ENCOUNTER — Other Ambulatory Visit (HOSPITAL_COMMUNITY): Payer: Self-pay

## 2023-09-01 NOTE — Progress Notes (Signed)
Specialty Pharmacy Refill Coordination Note  Dan [name] is a 77 y.o. male who's wife, [name] contacted today regarding refills of specialty medication(s) Abiraterone Acetate  Wife states that patient is taking a decreased dose of Zytiga 2 every day.   Patient requested Delivery   Delivery date: 09/03/23   Verified address: [address]   Medication will be filled on 09/02/23.

## 2023-09-01 NOTE — Progress Notes (Signed)
Specialty Pharmacy Ongoing Clinical Assessment Note  Dan [name] is a 77 y.o. male who is being followed by the specialty pharmacy service for RxSp Oncology. Pt wife [name] called to complete clinical outreach assessment.   [name] reports pt is taking Zytiga 2 every day. She also reports pt is still having SOB and Fatigue.  Pt is on Trelegy and PRN Proventil. Pt has also had an Echocardiogram. Referred to Pulm on 10/15.   Patient's specialty medication(s) reviewed today: Abiraterone Acetate   Missed doses in the last 4 weeks: 0   Patient/Caregiver did not have any additional questions or concerns.   Therapeutic benefit summary: Unable to assess   Adverse events/side effects summary: No adverse events/side effects   Patient's therapy is appropriate to: Continue    Goals Addressed             This Visit's Progress    Stabilization of disease       Patient is on track. Patient will maintain adherence         Follow up:  6 months  Bobette Mo Specialty Pharmacist

## 2023-09-02 ENCOUNTER — Other Ambulatory Visit: Payer: Self-pay

## 2023-09-25 ENCOUNTER — Ambulatory Visit: Payer: Medicare HMO

## 2023-09-25 DIAGNOSIS — Z85828 Personal history of other malignant neoplasm of skin: Secondary | ICD-10-CM | POA: Diagnosis not present

## 2023-09-25 DIAGNOSIS — L570 Actinic keratosis: Secondary | ICD-10-CM | POA: Diagnosis not present

## 2023-09-25 DIAGNOSIS — Z08 Encounter for follow-up examination after completed treatment for malignant neoplasm: Secondary | ICD-10-CM | POA: Diagnosis not present

## 2023-09-25 DIAGNOSIS — X32XXXD Exposure to sunlight, subsequent encounter: Secondary | ICD-10-CM | POA: Diagnosis not present

## 2023-09-25 DIAGNOSIS — C61 Malignant neoplasm of prostate: Secondary | ICD-10-CM | POA: Diagnosis not present

## 2023-09-25 DIAGNOSIS — B078 Other viral warts: Secondary | ICD-10-CM | POA: Diagnosis not present

## 2023-09-25 MED ORDER — DEGARELIX ACETATE 80 MG ~~LOC~~ SOLR
80.0000 mg | Freq: Once | SUBCUTANEOUS | Status: AC
  Administered 2023-09-25: 80 mg via SUBCUTANEOUS

## 2023-09-25 NOTE — Progress Notes (Addendum)
Firmagon Sub Q Injection  Due to Prostate Cancer patient is present today for a Firmagon Injection.   Medication: Deborra Medina (Degarelix)  Dose: 80mg  Location: left upper abdomen   Patient tolerated well, no complications were noted  Patient stated that last month he experienced increase hot flashes and he wanted to make MD aware.  MD made aware and will continue to monitor side effects.  Performed by:Jamieon Lannen LPN   Follow up: keep scheduled side effects

## 2023-10-01 ENCOUNTER — Other Ambulatory Visit: Payer: Self-pay

## 2023-10-07 ENCOUNTER — Ambulatory Visit: Payer: Medicare HMO | Admitting: Nurse Practitioner

## 2023-10-13 ENCOUNTER — Inpatient Hospital Stay: Payer: Medicare HMO | Attending: Hematology

## 2023-10-13 DIAGNOSIS — C775 Secondary and unspecified malignant neoplasm of intrapelvic lymph nodes: Secondary | ICD-10-CM | POA: Diagnosis not present

## 2023-10-13 DIAGNOSIS — C61 Malignant neoplasm of prostate: Secondary | ICD-10-CM | POA: Diagnosis not present

## 2023-10-13 DIAGNOSIS — D649 Anemia, unspecified: Secondary | ICD-10-CM | POA: Diagnosis not present

## 2023-10-13 DIAGNOSIS — Z7952 Long term (current) use of systemic steroids: Secondary | ICD-10-CM | POA: Diagnosis not present

## 2023-10-13 DIAGNOSIS — D508 Other iron deficiency anemias: Secondary | ICD-10-CM

## 2023-10-13 DIAGNOSIS — Z7983 Long term (current) use of bisphosphonates: Secondary | ICD-10-CM | POA: Diagnosis not present

## 2023-10-13 DIAGNOSIS — M810 Age-related osteoporosis without current pathological fracture: Secondary | ICD-10-CM | POA: Insufficient documentation

## 2023-10-13 LAB — CBC WITH DIFFERENTIAL/PLATELET
Abs Immature Granulocytes: 0.14 10*3/uL — ABNORMAL HIGH (ref 0.00–0.07)
Basophils Absolute: 0.1 10*3/uL (ref 0.0–0.1)
Basophils Relative: 0 %
Eosinophils Absolute: 0.1 10*3/uL (ref 0.0–0.5)
Eosinophils Relative: 1 %
HCT: 39.2 % (ref 39.0–52.0)
Hemoglobin: 12.3 g/dL — ABNORMAL LOW (ref 13.0–17.0)
Immature Granulocytes: 1 %
Lymphocytes Relative: 8 %
Lymphs Abs: 1.4 10*3/uL (ref 0.7–4.0)
MCH: 29.4 pg (ref 26.0–34.0)
MCHC: 31.4 g/dL (ref 30.0–36.0)
MCV: 93.8 fL (ref 80.0–100.0)
Monocytes Absolute: 0.9 10*3/uL (ref 0.1–1.0)
Monocytes Relative: 5 %
Neutro Abs: 14.8 10*3/uL — ABNORMAL HIGH (ref 1.7–7.7)
Neutrophils Relative %: 85 %
Platelets: 438 10*3/uL — ABNORMAL HIGH (ref 150–400)
RBC: 4.18 MIL/uL — ABNORMAL LOW (ref 4.22–5.81)
RDW: 16.5 % — ABNORMAL HIGH (ref 11.5–15.5)
WBC: 17.4 10*3/uL — ABNORMAL HIGH (ref 4.0–10.5)
nRBC: 0 % (ref 0.0–0.2)

## 2023-10-13 LAB — COMPREHENSIVE METABOLIC PANEL
ALT: 13 U/L (ref 0–44)
AST: 14 U/L — ABNORMAL LOW (ref 15–41)
Albumin: 3.7 g/dL (ref 3.5–5.0)
Alkaline Phosphatase: 56 U/L (ref 38–126)
Anion gap: 10 (ref 5–15)
BUN: 21 mg/dL (ref 8–23)
CO2: 23 mmol/L (ref 22–32)
Calcium: 10.6 mg/dL — ABNORMAL HIGH (ref 8.9–10.3)
Chloride: 104 mmol/L (ref 98–111)
Creatinine, Ser: 1.15 mg/dL (ref 0.61–1.24)
GFR, Estimated: >60 mL/min (ref >60)
Glucose, Bld: 114 mg/dL — ABNORMAL HIGH (ref 70–99)
Potassium: 4.5 mmol/L (ref 3.5–5.1)
Sodium: 137 mmol/L (ref 135–145)
Total Bilirubin: 0.3 mg/dL (ref <1.2)
Total Protein: 7 g/dL (ref 6.5–8.1)

## 2023-10-13 LAB — FERRITIN: Ferritin: 44 ng/mL (ref 24–336)

## 2023-10-13 LAB — IRON AND TIBC
Iron: 61 ug/dL (ref 45–182)
Saturation Ratios: 18 % (ref 17.9–39.5)
TIBC: 340 ug/dL (ref 250–450)
UIBC: 279 ug/dL

## 2023-10-13 LAB — PSA: Prostatic Specific Antigen: <0.01 ng/mL (ref 0.00–4.00)

## 2023-10-19 NOTE — Progress Notes (Signed)
[facility] [address] [place] [address]    Clinic Day:  10/20/2023  Referring physician: Richardean Chimera, MD  Patient Care Team: Dan Chimera, MD as PCP - General (Family Medicine) Wyline Mood Dorothe Pea, MD as PCP - Cardiology (Cardiology) Jena Gauss Gerrit Friends, MD as Consulting Physician (Gastroenterology) Doreatha Massed, MD as Medical Oncologist (Medical Oncology) Therese Sarah, RN as Oncology Nurse Navigator (Medical Oncology)   ASSESSMENT & PLAN:   Assessment: 1.  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 Ileene Hutchinson on 09/22/2018 - Final pathology: PT3b N0, Gleason 4+3 prostate adenocarcinoma, postoperative PSA remained detectable and rose to 5.7 - Axumin PET (06/2019): Local recurrence - Salvage XRT to the prostate fossa and pelvic lymph nodes, Dr. Brendolyn Patty from 07/05/2019 - 08/31/2019, without concurrent ADT.  PSA responded well reaching a nadir of 0.1. - PSA rose again to 3.4 in 06/2022, PSMA PET scan: Oligometastatic disease involving single internal iliac lymph node. - 07/29/2022 - 08/09/2022: 10 fx course of UHRT to right internal iliac node, PSA only decreased slightly to 3.1 in 10/2022, further went up to 5 on 12/05/2022 - PSMA PET (12/19/2022): New radiotracer avid 2 mm infrarenal retrocaval lymph node and a minimal increase size of the previously treated right common iliac lymph node measuring 3 mm compared to 2 mm previously, no evidence of recurrence in the prostatectomy bed or bone mets. - Loading dose of Firmagon with Dr. Annabell Howells on 01/09/2023 - Abiraterone and prednisone started on 01/18/2023. - XRT to the retrocaval lymph node and right common iliac lymph node completed on 01/29/2023, 10 fractions   2.  Social/family history: - He lives with wife at home.  He is independent of all ADLs and IADLs.  Uses cane for longer distances.  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  Current active smoker, 1 pack/day for 45 years. - Father had lung cancer and was smoker.    Plan: 1.  Metastatic CSPC to abdominal/high pelvic lymph node (low-volume): - He is tolerating abiraterone and prednisone reasonably well. - He reported severe decrease in energy levels. - Reviewed labs: Normal LFTs.  CBC grossly normal with mild anemia.  Ferritin is low at 44.  White count and platelet count is high since May of this year.  This can be from low-dose prednisone. - Due to severe fatigue, and low iron levels, recommend parenteral iron therapy with INFeD 1 g IV x 1.  Discussed side effects including rare chance of allergic reaction.  We will schedule him for iron infusion. - Continue abiraterone 500 mg daily and prednisone 5 mg daily.  RTC 12 weeks for follow-up. - Prior to next visit, we will do JAK2 V617F testing to evaluate for myeloproliferative neoplasms due to consistent elevated white count and platelet count.   2.  Hypomagnesemia: - Continue magnesium daily.   3.  Mild hypercalcemia: - He is not on calcium supplements.  He has mild hypercalcemia which is stable at 10.6.  He is on multivitamin tablet which has vitamin D in it.   4.  Osteoporosis (DEXA on 03/17/2023 T score -4): - First Prolia injection was on 04/03/2023. - He is due for his next injection.  He does not want to continue Prolia.  We will hold off on it.    Orders Placed This Encounter  Procedures   CBC with Differential  Standing Status:   Future    Expected Date:   01/12/2024    Expiration Date:   10/19/2024   Comprehensive metabolic panel    Standing Status:   Future    Expected Date:   01/12/2024    Expiration Date:   10/19/2024   PSA    Standing Status:   Future    Expected Date:   01/12/2024    Expiration Date:   10/19/2024   Iron and TIBC ([facility] ONLY)    Standing Status:   Future    Expected Date:   01/12/2024    Expiration Date:    10/19/2024   Ferritin    Standing Status:   Future    Expected Date:   01/12/2024    Expiration Date:   10/19/2024   JAK2 V617F rfx CALR/MPL/E12-15    Standing Status:   Future    Expected Date:   01/12/2024    Expiration Date:   10/19/2024      I,Katie Daubenspeck,acting as a scribe for Doreatha Massed, MD.,have documented all relevant documentation on the behalf of Doreatha Massed, MD,as directed by  Doreatha Massed, MD while in the presence of Doreatha Massed, MD.   I, Doreatha Massed MD, have reviewed the above documentation for accuracy and completeness, and I agree with the above.   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 RALP 09/22/18 (Dr. Ileene Hutchinson) 2. Salvage XRT + ADT 07/05/2019 - 08/31/2019 (Dr. Brendolyn Patty) 3. UHRT to right internal iliac node 07/29/22 - 08/09/22 4. UHRT to infrarenal retrocaval lymph node 01/16/23 - 01/29/23  Current Therapy:  Deborra Medina and Abiraterone    HISTORY OF PRESENT ILLNESS:   Oncology History   No history exists.     INTERVAL HISTORY:   Dan [name] is a 77 y.o. 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 He was last seen by me on 07/14/23.  Today, he states that he is doing well overall. His appetite level is at 100%. His energy level is at 25%.  PAST MEDICAL HISTORY:   Past Medical History: Past Medical History:  Diagnosis Date   BPH (benign prostatic hyperplasia)    COPD (chronic obstructive pulmonary disease) (HCC)    Depression    ED (erectile dysfunction)    Gout    Hyperlipidemia     Hypertension    Loss of hearing    Nocturia    Paroxysmal atrial fibrillation (HCC)    Prostate cancer (HCC)     Surgical History: Past Surgical History:  Procedure Laterality Date   CARDIOVASCULAR STRESS TEST  12/31/2004   normal perfuction nuclear study w/ no evidence ischemia /  normal LV function and wall motion , ef 52%   PROSTATECTOMY  2019   TOTAL HIP ARTHROPLASTY      Social History: Social History   Socioeconomic History   Marital status: Married    Spouse name: Not on file   Number of children: Not on file   Years of education: Not on file   Highest education level: Not on file  Occupational History   Not on file  Tobacco Use   Smoking status: Every Day    Types: Cigarettes    Start date: 06/12/1967    Passive exposure: Never   Smokeless tobacco: Never  Vaping Use   Vaping status: Never Used  Substance and Sexual Activity   Alcohol use: Not Currently   Drug use: Not Currently   Sexual activity: Not on file  Other Topics Concern   Not on file  Social History Narrative   Not on file   Social Drivers of Health   Financial Resource Strain: Not on file  Food Insecurity: No Food Insecurity (01/14/2023)   Hunger Vital Sign    Worried About Running Out of Food in the Last Year: Never true    Ran Out of Food in the Last Year: Never true  Transportation Needs: No Transportation Needs (01/14/2023)   PRAPARE - Administrator, Civil Service (Medical): No    Lack of Transportation (Non-Medical): No  Physical Activity: Not on file  Stress: Not on file  Social Connections: Unknown (03/18/2022)   [facility]   Social Network    Social Network: Not on file  Intimate Partner Violence: Not At Risk (01/14/2023)   Humiliation, Afraid, Rape, and Kick questionnaire    Fear of Current or Ex-Partner: No    Emotionally Abused: No    Physically Abused: No    Sexually Abused: No    Family History: Family History  Problem Relation  Age of Onset   Hypertension Mother    Cancer Father        lung   Colon cancer Neg Hx    Inflammatory bowel disease Neg Hx    Pancreatic cancer Neg Hx     Current Medications:  Current Outpatient Medications:    abiraterone acetate (ZYTIGA) 250 MG tablet, Take 4 tablets (1,000 mg total) by mouth daily. Take on an empty stomach 1 hour before or 2 hours after a meal, Disp: 120 tablet, Rfl: 3   albuterol (PROVENTIL HFA;VENTOLIN HFA) 108 (90 Base) MCG/ACT inhaler, Inhale 2 puffs into the lungs every 6 (six) hours as needed for wheezing or shortness of breath., Disp: , Rfl:    albuterol (PROVENTIL) (2.5 MG/3ML) 0.083% nebulizer solution, Take 2.5 mg by nebulization every 6 (six) hours as needed., Disp: , Rfl:    allopurinol (ZYLOPRIM) 100 MG tablet, Take 100 mg by mouth daily., Disp: , Rfl:    Ascorbic Acid (VITAMIN C) 1000 MG tablet, Take 1,000 mg by mouth daily., Disp: , Rfl:    aspirin EC 81 MG tablet, Take 81 mg by mouth daily. Swallow whole., Disp: , Rfl:    cholecalciferol (VITAMIN D3) 25 MCG (1000 UNIT) tablet, Take 1,000 Units by mouth daily., Disp: , Rfl:    colchicine 0.6 MG tablet, Take 1 tablet by mouth daily as needed., Disp: , Rfl:    diltiazem (CARDIZEM CD) 360 MG 24 hr capsule, Take by mouth daily., Disp: , Rfl:    HYDROcodone-acetaminophen (NORCO) 10-325 MG tablet, Take 1 tablet by mouth 4 (four) times daily as needed., Disp: , Rfl:    losartan (COZAAR) 25 MG tablet, TAKE 1 TABLET (25 MG TOTAL) BY MOUTH DAILY., Disp: 90 tablet, Rfl: 2   magnesium oxide (MAG-OX) 400 (240 Mg) MG tablet, TAKE 1 TABLET BY MOUTH EVERY DAY, Disp: 90 tablet, Rfl: 1   megestrol (MEGACE) 40 MG/ML suspension, Take by mouth., Disp: , Rfl:    potassium chloride (MICRO-K) 10 MEQ CR capsule, TAKE 2 CAPSULES  BY MOUTH DAILY., Disp: 7 capsule, Rfl: 0   predniSONE (DELTASONE) 5 MG tablet, Take 1 tablet (5 mg total) by mouth daily with breakfast., Disp: 90 tablet, Rfl: 1   TRELEGY ELLIPTA 200-62.5-25 MCG/ACT  AEPB, Inhale 1 puff into the lungs daily., Disp: , Rfl:    Zinc 50 MG TABS, Take 1 tablet by mouth daily., Disp: , Rfl:    Allergies: Allergies  Allergen Reactions   Tamsulosin Hives and Itching    REVIEW OF SYSTEMS:   Review of Systems  Constitutional:  Negative for chills, fatigue and fever.  HENT:   Negative for lump/mass, mouth sores, nosebleeds, sore throat and trouble swallowing.   Eyes:  Negative for eye problems.  Respiratory:  Positive for cough and shortness of breath.   Cardiovascular:  Negative for chest pain, leg swelling and palpitations.  Gastrointestinal:  Negative for abdominal pain, constipation, diarrhea, nausea and vomiting.  Genitourinary:  Negative for bladder incontinence, difficulty urinating, dysuria, frequency, hematuria and nocturia.   Musculoskeletal:  Positive for arthralgias and back pain. Negative for flank pain, myalgias and neck pain.  Skin:  Negative for itching and rash.  Neurological:  Negative for dizziness, headaches and numbness.  Hematological:  Does not bruise/bleed easily.  Psychiatric/Behavioral:  Positive for sleep disturbance. Negative for depression and suicidal ideas. The patient is not nervous/anxious.   All other systems reviewed and are negative.    VITALS:   Blood pressure (!) 142/57, pulse 80, temperature 98.1 F (36.7 C), temperature source Oral, resp. rate 19, SpO2 98%.  Wt Readings from Last 3 Encounters:  07/22/23 146 lb (66.2 kg)  07/14/23 148 lb 8 oz (67.4 kg)  05/29/23 148 lb 4.8 oz (67.3 kg)    There is no height or weight on file to calculate BMI.  Performance status (ECOG): 1 - Symptomatic but completely ambulatory  PHYSICAL EXAM:   Physical Exam Vitals and nursing note reviewed. Exam conducted with a chaperone present.  Constitutional:      Appearance: Normal appearance.  Cardiovascular:     Rate and Rhythm: Normal rate and regular rhythm.     Pulses: Normal pulses.     Heart sounds: Normal heart sounds.   Pulmonary:     Effort: Pulmonary effort is normal.     Breath sounds: Normal breath sounds.  Abdominal:     Palpations: Abdomen is soft. There is no hepatomegaly, splenomegaly or mass.     Tenderness: There is no abdominal tenderness.  Musculoskeletal:     Right lower leg: No edema.     Left lower leg: No edema.  Lymphadenopathy:     Cervical: No cervical adenopathy.     Right cervical: No superficial, deep or posterior cervical adenopathy.    Left cervical: No superficial, deep or posterior cervical adenopathy.     Upper Body:     Right upper body: No supraclavicular or axillary adenopathy.     Left upper body: No supraclavicular or axillary adenopathy.  Neurological:     General: No focal deficit present.     Mental Status: He is alert and oriented to person, place, and time.  Psychiatric:        Mood and Affect: Mood normal.        Behavior: Behavior normal.     LABS:      Latest Ref Rng & Units 10/13/2023    9:54 AM 07/09/2023   10:56 AM 05/23/2023   10:53 AM  CBC  WBC 4.0 - 10.5 K/uL 17.4  14.1  16.5   Hemoglobin 13.0 - 17.0 g/dL 59.5  63.8  75.6   Hematocrit 39.0 - 52.0 % 39.2  36.7  36.6   Platelets 150 - 400 K/uL 438  453  435       Latest Ref Rng & Units 10/13/2023    9:54 AM 08/14/2023   10:38 AM 07/14/2023    1:43 PM  CMP  Glucose 70 - 99 mg/dL 433  295  188   BUN 8 - 23 mg/dL 21  23  26    Creatinine 0.61 - 1.24 mg/dL 4.16  6.06  3.01   Sodium 135 - 145 mmol/L 137  137  133   Potassium 3.5 - 5.1 mmol/L 4.5  4.6  5.6   Chloride 98 - 111 mmol/L 104  98  98   CO2 22 - 32 mmol/L 23  18  20    Calcium 8.9 - 10.3 mg/dL 60.1  09.3  23.5   Total Protein 6.5 - 8.1 g/dL 7.0     Total Bilirubin <1.2 mg/dL 0.3     Alkaline Phos 38 - 126 U/L 56     AST 15 - 41 U/L 14     ALT 0 - 44 U/L 13        No results found for: "CEA1", "CEA" / No results found for: "CEA1", "CEA" Lab Results  Component Value Date   PSA1 <0.1 08/14/2023   No results found for: "TDD220" No  results found for: "CAN125"  No results found for: "TOTALPROTELP", "ALBUMINELP", "A1GS", "A2GS", "BETS", "BETA2SER", "GAMS", "MSPIKE", "SPEI" Lab Results  Component Value Date   TIBC 340 10/13/2023   TIBC 343 07/09/2023   FERRITIN 44 10/13/2023   FERRITIN 51 07/09/2023   IRONPCTSAT 18 10/13/2023   IRONPCTSAT 20 07/09/2023   No results found for: "LDH"   STUDIES:   No results found.

## 2023-10-20 ENCOUNTER — Inpatient Hospital Stay: Payer: Medicare HMO | Admitting: Hematology

## 2023-10-20 VITALS — BP 142/57 | HR 80 | Temp 98.1°F | Resp 19

## 2023-10-20 DIAGNOSIS — D75839 Thrombocytosis, unspecified: Secondary | ICD-10-CM | POA: Diagnosis not present

## 2023-10-20 DIAGNOSIS — D508 Other iron deficiency anemias: Secondary | ICD-10-CM

## 2023-10-20 DIAGNOSIS — C775 Secondary and unspecified malignant neoplasm of intrapelvic lymph nodes: Secondary | ICD-10-CM | POA: Diagnosis not present

## 2023-10-20 DIAGNOSIS — M810 Age-related osteoporosis without current pathological fracture: Secondary | ICD-10-CM | POA: Diagnosis not present

## 2023-10-20 DIAGNOSIS — Z7983 Long term (current) use of bisphosphonates: Secondary | ICD-10-CM | POA: Diagnosis not present

## 2023-10-20 DIAGNOSIS — D649 Anemia, unspecified: Secondary | ICD-10-CM | POA: Diagnosis not present

## 2023-10-20 DIAGNOSIS — Z7952 Long term (current) use of systemic steroids: Secondary | ICD-10-CM | POA: Diagnosis not present

## 2023-10-20 DIAGNOSIS — C61 Malignant neoplasm of prostate: Secondary | ICD-10-CM

## 2023-10-20 NOTE — Progress Notes (Signed)
Patient is taking Zytiga as prescribed.  He has not missed any doses and reports no side effects at this time.   

## 2023-10-20 NOTE — Patient Instructions (Signed)
[facility] Discharge Instructions   You were seen and examined today by Dr. Katragadda.  He reviewed the results of your lab work which are normal/stable.   Continue Zytiga as prescribed.   Return as scheduled.    Thank you for choosing [facility] to provide your oncology and hematology care.  To afford each patient quality time with our provider, please arrive at least 15 minutes before your scheduled appointment time.   If you have a lab appointment with the Cancer Center please come in thru the Main Entrance and check in at the main information desk.  You need to re-schedule your appointment should you arrive 10 or more minutes late.  We strive to give you quality time with our providers, and arriving late affects you and other patients whose appointments are after yours.  Also, if you no show three or more times for appointments you may be dismissed from the clinic at the providers discretion.     Again, thank you for choosing [facility].  Our hope is that these requests will decrease the amount of time that you wait before being seen by our physicians.       _____________________________________________________________  Should you have questions after your visit to [facility], please contact our office at [phone] and follow the prompts.  Our office hours are 8:00 a.m. and 4:30 p.m. Monday - Friday.  Please note that voicemails left after 4:00 p.m. may not be returned until the following business day.  We are closed weekends and major holidays.  You do have access to a nurse 24-7, just call the main number to the clinic [phone] and do not press any options, hold on the line and a nurse will answer the phone.    For prescription refill requests, have your pharmacy contact our office and allow 72 hours.    Due to Covid, you will need to wear a mask upon entering the hospital.  If you do not have a mask, a mask will be given to you at the Main Entrance upon arrival. For doctor visits, patients may have 1 support person age 18 or older with them. For treatment visits, patients can not have anyone with them due to social distancing guidelines and our immunocompromised population.      

## 2023-10-21 NOTE — Telephone Encounter (Signed)
Please advise 

## 2023-10-22 ENCOUNTER — Other Ambulatory Visit (INDEPENDENT_AMBULATORY_CARE_PROVIDER_SITE_OTHER): Payer: Medicare HMO

## 2023-10-22 ENCOUNTER — Encounter: Payer: Self-pay | Admitting: Orthopaedic Surgery

## 2023-10-22 ENCOUNTER — Ambulatory Visit: Payer: Medicare HMO | Admitting: Orthopaedic Surgery

## 2023-10-22 VITALS — BP 135/70 | HR 83 | Ht 70.0 in | Wt 150.0 lb

## 2023-10-22 DIAGNOSIS — M545 Low back pain, unspecified: Secondary | ICD-10-CM

## 2023-10-22 DIAGNOSIS — G8929 Other chronic pain: Secondary | ICD-10-CM

## 2023-10-22 DIAGNOSIS — M25551 Pain in right hip: Secondary | ICD-10-CM | POA: Diagnosis not present

## 2023-10-22 DIAGNOSIS — C61 Malignant neoplasm of prostate: Secondary | ICD-10-CM

## 2023-10-22 DIAGNOSIS — M79606 Pain in leg, unspecified: Secondary | ICD-10-CM

## 2023-10-22 DIAGNOSIS — C775 Secondary and unspecified malignant neoplasm of intrapelvic lymph nodes: Secondary | ICD-10-CM

## 2023-10-22 MED ORDER — OXYCODONE-ACETAMINOPHEN 5-325 MG PO TABS: ORAL_TABLET | ORAL | 0 refills | Status: DC

## 2023-10-22 MED ORDER — PREDNISONE 5 MG (21) PO TBPK: ORAL_TABLET | ORAL | 0 refills | Status: DC

## 2023-10-22 MED ORDER — CYCLOBENZAPRINE HCL 10 MG PO TABS: 10.0000 mg | ORAL_TABLET | Freq: Every day | ORAL | 0 refills | Status: DC

## 2023-10-22 NOTE — Progress Notes (Signed)
My hip and back hurt.  He has had pain radiating from the lower back to the right lower leg, intense at times.  He has had decreasing ROM of the right hip.  He has no trauma.  He is using a walker.  He has prostate cancer with metastasis.  Last PET scan February 2024 showed no lower back or hip involvement.  He says nothing helps his pain.  X-rays were done of the right hip and lumbar spine, reported separately.  Encounter Diagnoses  Name Primary?   Lumbar pain with radiation down leg Yes   Chronic right hip pain    Malignant neoplasm of prostate metastatic to intrapelvic lymph node (HCC)    I will get MRI with contrast of the lumbar spine and right hip.  Return in three weeks.  I have reviewed the [place] Controlled Substance Reporting System web site prior to prescribing narcotic medicine for this patient.

## 2023-10-22 NOTE — Addendum Note (Signed)
Addended by: Michaele Offer on: 10/22/2023 03:22 PM   Modules accepted: Orders

## 2023-10-22 NOTE — Patient Instructions (Signed)
Central scheduling [phone] 

## 2023-10-22 NOTE — Addendum Note (Signed)
Addended by: Michaele Offer on: 10/22/2023 10:38 AM   Modules accepted: Orders

## 2023-10-23 ENCOUNTER — Ambulatory Visit: Payer: Medicare HMO

## 2023-10-23 DIAGNOSIS — C61 Malignant neoplasm of prostate: Secondary | ICD-10-CM | POA: Diagnosis not present

## 2023-10-23 MED ORDER — DEGARELIX ACETATE 80 MG ~~LOC~~ SOLR
80.0000 mg | Freq: Once | SUBCUTANEOUS | Status: AC
  Administered 2023-10-23: 80 mg via SUBCUTANEOUS

## 2023-10-23 NOTE — Progress Notes (Signed)
Firmagon Sub Q Injection  Due to Prostate Cancer patient is present today for a Firmagon Injection.   Medication: Firmagon (Degarelix)  Dose: '80mg'$  Location: right upper abdomen  Patient tolerated well, no complications were noted  Performed by: Olivette Beckmann LPN  Follow up: keep scheduled [place]

## 2023-10-24 ENCOUNTER — Other Ambulatory Visit: Payer: Self-pay

## 2023-10-27 ENCOUNTER — Other Ambulatory Visit: Payer: Self-pay

## 2023-10-27 ENCOUNTER — Ambulatory Visit: Payer: Medicare HMO

## 2023-10-27 MED ORDER — OXYCODONE-ACETAMINOPHEN 5-325 MG PO TABS: ORAL_TABLET | ORAL | 0 refills | Status: DC

## 2023-10-27 NOTE — Telephone Encounter (Signed)
Oxycodone-Acetaminophen 5/325 MG  Qty 25 Tablets    One tablet every four hours as need for pain, 5 day limit.  PATIENT USES [facility]

## 2023-10-30 ENCOUNTER — Other Ambulatory Visit: Payer: Self-pay

## 2023-10-30 NOTE — Progress Notes (Signed)
Specialty Pharmacy Refill Coordination Note  Dan [name] is a 77 y.o. male contacted today regarding refills of specialty medication(s) Abiraterone Acetate (ZYTIGA)   Patient requested Delivery   Delivery date: 11/03/23   Verified address: [address]   Medication will be filled on 10/31/23.

## 2023-10-31 ENCOUNTER — Other Ambulatory Visit: Payer: Self-pay

## 2023-10-31 ENCOUNTER — Ambulatory Visit (HOSPITAL_COMMUNITY): Payer: Medicare HMO

## 2023-11-06 DIAGNOSIS — C774 Secondary and unspecified malignant neoplasm of inguinal and lower limb lymph nodes: Secondary | ICD-10-CM | POA: Diagnosis not present

## 2023-11-06 DIAGNOSIS — D692 Other nonthrombocytopenic purpura: Secondary | ICD-10-CM | POA: Diagnosis not present

## 2023-11-06 DIAGNOSIS — M109 Gout, unspecified: Secondary | ICD-10-CM | POA: Diagnosis not present

## 2023-11-06 DIAGNOSIS — Z23 Encounter for immunization: Secondary | ICD-10-CM | POA: Diagnosis not present

## 2023-11-06 DIAGNOSIS — I482 Chronic atrial fibrillation, unspecified: Secondary | ICD-10-CM | POA: Diagnosis not present

## 2023-11-06 DIAGNOSIS — I739 Peripheral vascular disease, unspecified: Secondary | ICD-10-CM | POA: Diagnosis not present

## 2023-11-06 DIAGNOSIS — C61 Malignant neoplasm of prostate: Secondary | ICD-10-CM | POA: Diagnosis not present

## 2023-11-06 DIAGNOSIS — R002 Palpitations: Secondary | ICD-10-CM | POA: Diagnosis not present

## 2023-11-06 DIAGNOSIS — R4582 Worries: Secondary | ICD-10-CM | POA: Diagnosis not present

## 2023-11-06 DIAGNOSIS — J449 Chronic obstructive pulmonary disease, unspecified: Secondary | ICD-10-CM | POA: Diagnosis not present

## 2023-11-06 DIAGNOSIS — M353 Polymyalgia rheumatica: Secondary | ICD-10-CM | POA: Diagnosis not present

## 2023-11-10 ENCOUNTER — Ambulatory Visit (HOSPITAL_COMMUNITY): Admission: RE | Admit: 2023-11-10 | Payer: Medicare HMO | Source: Ambulatory Visit

## 2023-11-10 ENCOUNTER — Ambulatory Visit (HOSPITAL_COMMUNITY): Payer: Medicare HMO

## 2023-11-12 ENCOUNTER — Encounter: Payer: Self-pay | Admitting: Hematology

## 2023-11-12 ENCOUNTER — Ambulatory Visit: Payer: Medicare HMO | Admitting: Orthopaedic Surgery

## 2023-11-13 ENCOUNTER — Inpatient Hospital Stay: Payer: HMO

## 2023-11-14 DIAGNOSIS — F17210 Nicotine dependence, cigarettes, uncomplicated: Secondary | ICD-10-CM | POA: Diagnosis not present

## 2023-11-14 DIAGNOSIS — R030 Elevated blood-pressure reading, without diagnosis of hypertension: Secondary | ICD-10-CM | POA: Diagnosis not present

## 2023-11-14 DIAGNOSIS — R221 Localized swelling, mass and lump, neck: Secondary | ICD-10-CM | POA: Diagnosis not present

## 2023-11-14 DIAGNOSIS — Z6824 Body mass index (BMI) 24.0-24.9, adult: Secondary | ICD-10-CM | POA: Diagnosis not present

## 2023-11-16 ENCOUNTER — Encounter (HOSPITAL_COMMUNITY): Payer: Self-pay

## 2023-11-16 ENCOUNTER — Ambulatory Visit (HOSPITAL_COMMUNITY)
Admission: RE | Admit: 2023-11-16 | Discharge: 2023-11-16 | Disposition: A | Discharge status: Home / Self Care | Payer: Self-pay | Source: Ambulatory Visit | Attending: Orthopaedic Surgery | Admitting: Orthopaedic Surgery

## 2023-11-16 DIAGNOSIS — M79606 Pain in leg, unspecified: Secondary | ICD-10-CM

## 2023-11-16 DIAGNOSIS — C61 Malignant neoplasm of prostate: Secondary | ICD-10-CM

## 2023-11-16 DIAGNOSIS — G8929 Other chronic pain: Secondary | ICD-10-CM

## 2023-11-17 ENCOUNTER — Encounter: Payer: Self-pay | Admitting: Hematology

## 2023-11-17 ENCOUNTER — Telehealth: Payer: Self-pay | Admitting: Orthopaedic Surgery

## 2023-11-17 NOTE — Telephone Encounter (Signed)
 Thanks there is no authorization required. I will update the referral

## 2023-11-17 NOTE — Telephone Encounter (Signed)
 I called and spoke with Tillman, his spouse, she gave me the HTA policy [phone].  I called the eligibility line she gave me [phone] option 1 and I spoke w/[place] to verify eligibility.  She stated the patient is eligible 11/05/23 - 11/03/24.  She stated she gave the policy # to [place] back in December and gave the card to the x-ray, but there is no card on file.  I have entered the insurance to the patient's account.

## 2023-11-17 NOTE — Telephone Encounter (Signed)
 Dr. Janae pt - spoke w/the spouse, she stated that the patient was not able to have his MRI done bc they said it requires a PA.  She stated that she has spoken w/the the [employer] company and they do not require one.  She would like a call back to let her know what is going on.  [phone]

## 2023-11-17 NOTE — Telephone Encounter (Signed)
 What is his insurance side bar says self pay / self pay does not need auth but he should be some sort of Medicare since he is 16

## 2023-11-17 NOTE — Telephone Encounter (Signed)
 Thanks for putting the insurance in the system. I have called them to advise authorization not required and to tell her to call and schedule, she wanted a call from central scheduling so I messaged April Pait and told her they will call from scheduling.  Not sure why xray would need the card, but I am so glad you put it in there now I hate the MRI was cancelled and didn't even need approval, but again, thank you this is done now.

## 2023-11-18 ENCOUNTER — Encounter: Payer: Self-pay | Admitting: Hematology

## 2023-11-18 ENCOUNTER — Encounter: Payer: Self-pay | Admitting: Urology

## 2023-11-19 ENCOUNTER — Other Ambulatory Visit: Payer: Self-pay

## 2023-11-19 ENCOUNTER — Ambulatory Visit: Payer: Medicare HMO | Admitting: Orthopaedic Surgery

## 2023-11-19 NOTE — Telephone Encounter (Signed)
**Note De-identified  [place] Obfuscation** Please advise 

## 2023-11-20 ENCOUNTER — Ambulatory Visit: Payer: HMO

## 2023-11-20 DIAGNOSIS — C61 Malignant neoplasm of prostate: Secondary | ICD-10-CM | POA: Diagnosis not present

## 2023-11-20 MED ORDER — DEGARELIX ACETATE 80 MG ~~LOC~~ SOLR
80.0000 mg | Freq: Once | SUBCUTANEOUS | Status: AC
  Administered 2023-11-20: 80 mg via SUBCUTANEOUS

## 2023-11-20 NOTE — Progress Notes (Signed)
Firmagon Sub Q Injection  Due to Prostate Cancer patient is present today for a Firmagon Injection.   Medication: Firmagon (Degarelix)  Dose: 80mg  Location: left upper abdomen Patient tolerated well, no complications were noted  Performed by: Geneal Huebert LPN  Follow up: Keep scheduled [place]

## 2023-11-22 ENCOUNTER — Ambulatory Visit (HOSPITAL_COMMUNITY)
Admission: RE | Admit: 2023-11-22 | Discharge: 2023-11-22 | Disposition: A | Discharge status: Home / Self Care | Payer: HMO | Source: Ambulatory Visit | Attending: Orthopaedic Surgery | Admitting: Orthopaedic Surgery

## 2023-11-22 ENCOUNTER — Encounter (HOSPITAL_COMMUNITY): Payer: Self-pay

## 2023-11-22 DIAGNOSIS — G8929 Other chronic pain: Secondary | ICD-10-CM | POA: Diagnosis not present

## 2023-11-22 DIAGNOSIS — Z8546 Personal history of malignant neoplasm of prostate: Secondary | ICD-10-CM | POA: Diagnosis not present

## 2023-11-22 DIAGNOSIS — M79606 Pain in leg, unspecified: Secondary | ICD-10-CM | POA: Insufficient documentation

## 2023-11-22 DIAGNOSIS — M5137 Other intervertebral disc degeneration, lumbosacral region with discogenic back pain only: Secondary | ICD-10-CM | POA: Diagnosis not present

## 2023-11-22 DIAGNOSIS — Z96642 Presence of left artificial hip joint: Secondary | ICD-10-CM | POA: Diagnosis not present

## 2023-11-22 DIAGNOSIS — M25551 Pain in right hip: Secondary | ICD-10-CM | POA: Insufficient documentation

## 2023-11-22 DIAGNOSIS — C775 Secondary and unspecified malignant neoplasm of intrapelvic lymph nodes: Secondary | ICD-10-CM | POA: Diagnosis not present

## 2023-11-22 DIAGNOSIS — M545 Low back pain, unspecified: Secondary | ICD-10-CM | POA: Diagnosis not present

## 2023-11-22 DIAGNOSIS — C61 Malignant neoplasm of prostate: Secondary | ICD-10-CM | POA: Insufficient documentation

## 2023-11-22 DIAGNOSIS — M87051 Idiopathic aseptic necrosis of right femur: Secondary | ICD-10-CM | POA: Diagnosis not present

## 2023-11-22 DIAGNOSIS — M65951 Unspecified synovitis and tenosynovitis, right thigh: Secondary | ICD-10-CM | POA: Diagnosis not present

## 2023-11-22 DIAGNOSIS — M5136 Other intervertebral disc degeneration, lumbar region with discogenic back pain only: Secondary | ICD-10-CM | POA: Diagnosis not present

## 2023-11-22 MED ORDER — GADOBUTROL 1 MMOL/ML IV SOLN
6.8000 mL | Freq: Once | INTRAVENOUS | Status: AC | PRN
  Administered 2023-11-22: 6.8 mL via INTRAVENOUS

## 2023-11-25 ENCOUNTER — Inpatient Hospital Stay: Payer: HMO

## 2023-11-26 ENCOUNTER — Other Ambulatory Visit: Payer: Medicare HMO

## 2023-11-26 ENCOUNTER — Other Ambulatory Visit: Payer: Self-pay

## 2023-11-26 ENCOUNTER — Other Ambulatory Visit: Payer: Self-pay | Admitting: Hematology

## 2023-11-26 MED ORDER — ABIRATERONE ACETATE 250 MG PO TABS
1000.0000 mg | ORAL_TABLET | Freq: Every day | ORAL | 3 refills | Status: DC
  Filled 2023-11-26: qty 120, 30d supply, fill #0
  Filled 2024-01-05 – 2024-01-29 (×2): qty 120, 30d supply, fill #1
  Filled 2024-05-05: qty 120, 30d supply, fill #2
  Filled 2024-06-04 – 2024-07-08 (×4): qty 120, 30d supply, fill #3

## 2023-11-26 NOTE — Progress Notes (Signed)
Specialty Pharmacy Refill Coordination Note  Dan [name] is a 78 y.o. male contacted today regarding refills of specialty medication(s) Abiraterone Acetate (ZYTIGA) Spoke with patient's wife  Patient requested Delivery   Delivery date: 12/03/23   Verified address: [address]   Medication will be filled on 01.28.25.

## 2023-11-27 ENCOUNTER — Other Ambulatory Visit: Payer: Self-pay

## 2023-11-27 DIAGNOSIS — R221 Localized swelling, mass and lump, neck: Secondary | ICD-10-CM | POA: Diagnosis not present

## 2023-11-27 DIAGNOSIS — Z8546 Personal history of malignant neoplasm of prostate: Secondary | ICD-10-CM | POA: Diagnosis not present

## 2023-12-01 ENCOUNTER — Encounter: Payer: Self-pay | Admitting: Urology

## 2023-12-01 DIAGNOSIS — R5383 Other fatigue: Secondary | ICD-10-CM | POA: Diagnosis not present

## 2023-12-01 DIAGNOSIS — I10 Essential (primary) hypertension: Secondary | ICD-10-CM | POA: Diagnosis not present

## 2023-12-01 NOTE — Telephone Encounter (Signed)
FYI and advise

## 2023-12-02 ENCOUNTER — Inpatient Hospital Stay: Payer: HMO | Attending: Hematology

## 2023-12-02 ENCOUNTER — Other Ambulatory Visit: Payer: Self-pay

## 2023-12-02 ENCOUNTER — Encounter: Payer: Self-pay | Admitting: Hematology

## 2023-12-02 VITALS — BP 149/58 | HR 70 | Temp 98.6°F | Resp 18 | Ht 70.0 in | Wt 154.6 lb

## 2023-12-02 DIAGNOSIS — C61 Malignant neoplasm of prostate: Secondary | ICD-10-CM | POA: Diagnosis not present

## 2023-12-02 DIAGNOSIS — M810 Age-related osteoporosis without current pathological fracture: Secondary | ICD-10-CM | POA: Diagnosis not present

## 2023-12-02 DIAGNOSIS — E611 Iron deficiency: Secondary | ICD-10-CM | POA: Diagnosis not present

## 2023-12-02 DIAGNOSIS — M150 Primary generalized (osteo)arthritis: Secondary | ICD-10-CM

## 2023-12-02 DIAGNOSIS — C775 Secondary and unspecified malignant neoplasm of intrapelvic lymph nodes: Secondary | ICD-10-CM | POA: Diagnosis not present

## 2023-12-02 MED ORDER — ACETAMINOPHEN 325 MG PO TABS
650.0000 mg | ORAL_TABLET | Freq: Once | ORAL | Status: AC
  Administered 2023-12-02: 650 mg via ORAL
  Filled 2023-12-02: qty 2

## 2023-12-02 MED ORDER — CETIRIZINE HCL 10 MG/ML IV SOLN
10.0000 mg | Freq: Once | INTRAVENOUS | Status: AC
Start: 2023-12-02 — End: 2023-12-02
  Administered 2023-12-02: 10 mg via INTRAVENOUS
  Filled 2023-12-02: qty 1

## 2023-12-02 MED ORDER — IRON DEXTRAN 50 MG/ML IJ SOLN
50.0000 mg | Freq: Once | INTRAMUSCULAR | Status: AC
  Administered 2023-12-02: 50 mg via INTRAVENOUS
  Filled 2023-12-02: qty 1

## 2023-12-02 MED ORDER — SODIUM CHLORIDE 0.9 % IV SOLN: INTRAVENOUS | Status: DC

## 2023-12-02 MED ORDER — IRON DEXTRAN 50 MG/ML IJ SOLN
950.0000 mg | Freq: Once | INTRAMUSCULAR | Status: AC
  Administered 2023-12-02: 950 mg via INTRAVENOUS
  Filled 2023-12-02: qty 19

## 2023-12-02 MED ORDER — FAMOTIDINE IN NACL 20-0.9 MG/50ML-% IV SOLN
20.0000 mg | Freq: Once | INTRAVENOUS | Status: AC
Start: 2023-12-02 — End: 2023-12-02
  Administered 2023-12-02: 20 mg via INTRAVENOUS
  Filled 2023-12-02: qty 50

## 2023-12-02 MED ORDER — METHYLPREDNISOLONE SODIUM SUCC 125 MG IJ SOLR
125.0000 mg | Freq: Once | INTRAMUSCULAR | Status: AC
Start: 2023-12-02 — End: 2023-12-02
  Administered 2023-12-02: 125 mg via INTRAVENOUS
  Filled 2023-12-02: qty 2

## 2023-12-02 NOTE — Patient Instructions (Signed)
[facility] [place] - A DEPT OF [facility]  Discharge Instructions: Thank you for choosing [facility] to provide your oncology and hematology care.  If you have a lab appointment with the Cancer Center - please note that after April 8th, 2024, all labs will be drawn in the cancer center.  You do not have to check in or register with the main entrance as you have in the past but will complete your check-in in the cancer center.  Wear comfortable clothing and clothing appropriate for easy access to any Portacath or PICC line.   We strive to give you quality time with your provider. You may need to reschedule your appointment if you arrive late (15 or more minutes).  Arriving late affects you and other patients whose appointments are after yours.  Also, if you miss three or more appointments without notifying the office, you may be dismissed from the clinic at the provider's discretion.      For prescription refill requests, have your pharmacy contact our office and allow 72 hours for refills to be completed.    Today you received Infed IV iron infusion.    BELOW ARE SYMPTOMS THAT SHOULD BE REPORTED IMMEDIATELY: *FEVER GREATER THAN 100.4 F (38 C) OR HIGHER *CHILLS OR SWEATING *NAUSEA AND VOMITING THAT IS NOT CONTROLLED WITH YOUR NAUSEA MEDICATION *UNUSUAL SHORTNESS OF BREATH *UNUSUAL BRUISING OR BLEEDING *URINARY PROBLEMS (pain or burning when urinating, or frequent urination) *BOWEL PROBLEMS (unusual diarrhea, constipation, pain near the anus) TENDERNESS IN MOUTH AND THROAT WITH OR WITHOUT PRESENCE OF ULCERS (sore throat, sores in mouth, or a toothache) UNUSUAL RASH, SWELLING OR PAIN  UNUSUAL VAGINAL DISCHARGE OR ITCHING   Items with * indicate a potential emergency and should be followed up as soon as possible or go to the Emergency Department if any problems should occur.  Please show the CHEMOTHERAPY ALERT CARD or IMMUNOTHERAPY ALERT CARD at  check-in to the Emergency Department and triage nurse.  Should you have questions after your visit or need to cancel or reschedule your appointment, please contact [facility] [place] - A DEPT OF [facility] [phone]  and follow the prompts.  Office hours are 8:00 a.m. to 4:30 p.m. Monday - Friday. Please note that voicemails left after 4:00 p.m. may not be returned until the following business day.  We are closed weekends and major holidays. You have access to a nurse at all times for urgent questions. Please call the main number to the clinic [phone] and follow the prompts.  For any non-urgent questions, you may also contact your provider using MyChart. We now offer e-Visits for anyone 52 and older to request care online for non-urgent symptoms. For details visit [email or web address].   Also download the MyChart app! Go to the app store, search "MyChart", open the app, select [place], and log in with your MyChart username and password.

## 2023-12-02 NOTE — Progress Notes (Signed)
Patient presents today for iron infusion.  Patient is in satisfactory condition with no new complaints voiced.  Vital signs are stable.  We will proceed with infusion per provider orders.    Peripheral IV started with good blood return pre and post infusion.  Infed 1,000 mg given today per MD orders. Tolerated infusion without adverse affects. Vital signs stable. No complaints at this time. Discharged from clinic ambulatory with cane in stable condition. Alert and oriented x 3. F/U with [facility] as scheduled.

## 2023-12-03 ENCOUNTER — Encounter: Payer: Self-pay | Admitting: Orthopaedic Surgery

## 2023-12-03 ENCOUNTER — Ambulatory Visit: Payer: HMO | Admitting: Orthopaedic Surgery

## 2023-12-03 ENCOUNTER — Telehealth: Payer: Self-pay

## 2023-12-03 ENCOUNTER — Inpatient Hospital Stay: Payer: HMO

## 2023-12-03 VITALS — BP 159/76 | HR 79 | Ht 70.0 in | Wt 152.0 lb

## 2023-12-03 DIAGNOSIS — M87051 Idiopathic aseptic necrosis of right femur: Secondary | ICD-10-CM | POA: Diagnosis not present

## 2023-12-03 DIAGNOSIS — F17210 Nicotine dependence, cigarettes, uncomplicated: Secondary | ICD-10-CM | POA: Diagnosis not present

## 2023-12-03 DIAGNOSIS — G8929 Other chronic pain: Secondary | ICD-10-CM

## 2023-12-03 DIAGNOSIS — C61 Malignant neoplasm of prostate: Secondary | ICD-10-CM | POA: Diagnosis not present

## 2023-12-03 DIAGNOSIS — M79606 Pain in leg, unspecified: Secondary | ICD-10-CM

## 2023-12-03 DIAGNOSIS — C775 Secondary and unspecified malignant neoplasm of intrapelvic lymph nodes: Secondary | ICD-10-CM

## 2023-12-03 DIAGNOSIS — E611 Iron deficiency: Secondary | ICD-10-CM | POA: Diagnosis not present

## 2023-12-03 DIAGNOSIS — M545 Low back pain, unspecified: Secondary | ICD-10-CM | POA: Diagnosis not present

## 2023-12-03 LAB — CBC WITH DIFFERENTIAL/PLATELET
Abs Immature Granulocytes: 0.23 10*3/uL — ABNORMAL HIGH (ref 0.00–0.07)
Basophils Absolute: 0 10*3/uL (ref 0.0–0.1)
Basophils Relative: 0 %
Eosinophils Absolute: 0 10*3/uL (ref 0.0–0.5)
Eosinophils Relative: 0 %
HCT: 35.1 % — ABNORMAL LOW (ref 39.0–52.0)
Hemoglobin: 11.3 g/dL — ABNORMAL LOW (ref 13.0–17.0)
Immature Granulocytes: 1 %
Lymphocytes Relative: 4 %
Lymphs Abs: 1 10*3/uL (ref 0.7–4.0)
MCH: 28.8 pg (ref 26.0–34.0)
MCHC: 32.2 g/dL (ref 30.0–36.0)
MCV: 89.5 fL (ref 80.0–100.0)
Monocytes Absolute: 1 10*3/uL (ref 0.1–1.0)
Monocytes Relative: 4 %
Neutro Abs: 21.8 10*3/uL — ABNORMAL HIGH (ref 1.7–7.7)
Neutrophils Relative %: 91 %
Platelets: 493 10*3/uL — ABNORMAL HIGH (ref 150–400)
RBC: 3.92 MIL/uL — ABNORMAL LOW (ref 4.22–5.81)
RDW: 14.7 % (ref 11.5–15.5)
WBC: 24.1 10*3/uL — ABNORMAL HIGH (ref 4.0–10.5)
nRBC: 0 % (ref 0.0–0.2)

## 2023-12-03 LAB — COMPREHENSIVE METABOLIC PANEL
ALT: 12 U/L (ref 0–44)
AST: 19 U/L (ref 15–41)
Albumin: 3.6 g/dL (ref 3.5–5.0)
Alkaline Phosphatase: 59 U/L (ref 38–126)
Anion gap: 12 (ref 5–15)
BUN: 21 mg/dL (ref 8–23)
CO2: 20 mmol/L — ABNORMAL LOW (ref 22–32)
Calcium: 10.8 mg/dL — ABNORMAL HIGH (ref 8.9–10.3)
Chloride: 102 mmol/L (ref 98–111)
Creatinine, Ser: 1.08 mg/dL (ref 0.61–1.24)
GFR, Estimated: >60 mL/min (ref >60)
Glucose, Bld: 120 mg/dL — ABNORMAL HIGH (ref 70–99)
Potassium: 3.7 mmol/L (ref 3.5–5.1)
Sodium: 134 mmol/L — ABNORMAL LOW (ref 135–145)
Total Bilirubin: 0.9 mg/dL (ref 0.0–1.2)
Total Protein: 7.5 g/dL (ref 6.5–8.1)

## 2023-12-03 LAB — PSA: Prostatic Specific Antigen: 0.01 ng/mL (ref 0.00–4.00)

## 2023-12-03 LAB — MAGNESIUM: Magnesium: 2.1 mg/dL (ref 1.7–2.4)

## 2023-12-03 LAB — LACTATE DEHYDROGENASE: LDH: 99 U/L (ref 98–192)

## 2023-12-03 MED ORDER — HYDROCODONE-ACETAMINOPHEN 10-325 MG PO TABS: 1.0000 | ORAL_TABLET | Freq: Four times a day (QID) | ORAL | 0 refills | Status: DC | PRN

## 2023-12-03 NOTE — Telephone Encounter (Signed)
Called PCP to have [place] faxed over per MD Annabell Howells

## 2023-12-03 NOTE — Progress Notes (Signed)
My back and hip hurt.  He has lower back pain and right hip pain.  He is using a cane.  He had MRI of the lumbar spine which showed: IMPRESSION: 1. Diffuse fatty change of the marrow suggesting regional radiation. No sign of bony metastatic disease. 2. L4-5: Chronic disc degeneration with loss of disc height. Endplate osteophytes and bulging of the disc. Facet and ligamentous hypertrophy. Mild multifactorial stenosis but without visible neural compression. 3. L5-S1: Chronic disc degeneration with loss of disc height. Central to left posterolateral disc herniation with mild mass-effect upon the left S1 nerve. This could be symptomatic on the left.  The hip showed: IMPRESSION: 1. Avascular necrosis of the superior right femoral head with surrounding marrow edema and a nondisplaced, nondepressed subchondral fracture. 2. Moderate-severe osteoarthritis of the right hip. Large right hip joint effusion with synovitis. 3. Degenerative tearing of the right anterosuperior labrum extending into the superior labrum. 4. No aggressive osseous lesion to suggest metastatic disease.  I have explained the findings to him.  I will have him see neurosurgeon for the back.  The hip will need total hip.  But I would like for him to see neurosurgeon first.  I have independently reviewed the MRI.    His lower back is diffusely tender, decreased ROM, NV intact, muscle tone and strength normal.  Right hip with decreased motion.  Uses cane.  Limp right.  Encounter Diagnoses  Name Primary?   Lumbar pain with radiation down leg Yes   Chronic right hip pain    Malignant neoplasm of prostate metastatic to intrapelvic lymph node (HCC)    Cigarette nicotine dependence without complication    Avascular necrosis of bone of hip, right (HCC)    I would like for him to see neurosurgeon and see if possible epidural will help.  I will see him in a month and then have referral for hip problem.  I will change pain  medicine to hydrocodone 10.  It helps him the best.  I have reviewed the [facility] Controlled Substance Reporting System web site prior to prescribing narcotic medicine for this patient.  Call if any problem.  Precautions discussed.  Electronically Signed Darreld Mclean, MD 1/29/20259:51 AM

## 2023-12-03 NOTE — Patient Instructions (Signed)
You have been referred to [facility] on [facility] in [place], they will call you with appointment. If you have not heard anything in one week call them to schedule [phone]

## 2023-12-04 ENCOUNTER — Telehealth: Payer: Self-pay

## 2023-12-04 ENCOUNTER — Ambulatory Visit: Payer: HMO | Admitting: Urology

## 2023-12-04 VITALS — BP 156/73 | HR 85

## 2023-12-04 DIAGNOSIS — C61 Malignant neoplasm of prostate: Secondary | ICD-10-CM | POA: Diagnosis not present

## 2023-12-04 DIAGNOSIS — N393 Stress incontinence (female) (male): Secondary | ICD-10-CM | POA: Diagnosis not present

## 2023-12-04 DIAGNOSIS — C772 Secondary and unspecified malignant neoplasm of intra-abdominal lymph nodes: Secondary | ICD-10-CM

## 2023-12-04 LAB — URINALYSIS, ROUTINE W REFLEX MICROSCOPIC
Bilirubin, UA: NEGATIVE
Glucose, UA: NEGATIVE
Ketones, UA: NEGATIVE
Leukocytes,UA: NEGATIVE
Nitrite, UA: NEGATIVE
RBC, UA: NEGATIVE
Specific Gravity, UA: 1.02 (ref 1.005–1.030)
Urobilinogen, Ur: 1 mg/dL (ref 0.2–1.0)
pH, UA: 7 (ref 5.0–7.5)

## 2023-12-04 NOTE — Telephone Encounter (Signed)
Attempted to reach patient and wife regarding Dr. Ellin Saba recommendation. Unable to reach directly. Detailed VM left asking that they return my call

## 2023-12-04 NOTE — Progress Notes (Signed)
Subjective: 1. Prostate cancer (HCC)   2. Prostate cancer metastatic to intraabdominal lymph node (HCC)   3. Stress incontinence, male     12/04/23: Dan [name] returns today in f/u.  He had a right neck mass that was found to be a lymph node on an in office [place] at [facility].  He had some antibiotics of his wife's that he took with improvement in the node.  His PSA remians undetectible. He last had firmagon on 11/20/23 and started it in 3/24  and remains on Zytiga and prednisone with Dr. Ellin Saba. He has had no weight loss or bone pain but he has some chronic back and hip pain.  His IPSS is 18 with nocturia x 4 but he is in bed for a long time.  He has some incontinence. He is using a pad.    05/22/23: Dan [name] returns today in f/u.  He is on firmagon and abi/pred for his mCSPC with prior RP, salvage RTx to the prostate bed and 2 rounds of SBRT for nodal recurrences with the last in 3/24. His PSA is <0.1 and his T is <3 on therapy on 05/15/23 .  He has a history of hematuria with a positive cytology with a negative CT and cystocopy in 6/23.  Cytology subsequently normalized.   He has fatigue and SOB.  He has no weight loss or bone pain.  His IPSS is 11 with nocturia x 4.  He reports intermittency and some incontinence.    02/13/23: Dan [name] returns today in f/u. His PSA was 6.9 on 01/02/23.  He got his first firmagon shot on 01/09/23 and he had SBRT for the recurrent nodes in late March.  He saw Dr. Ellin Saba on 01/14/23 and has been started on Abiraterone/pred to intensify his ADT.  He has some hot flashes but no fatigue.  He has no GI complaints.   He had a site reaction with the initial firmagon but it has resolved.   01/02/23: Dan [name] returns today in f/u.  His PSA was up to 5.0 on 12/05/22.  A repeat PET showed uptake in a 2mm retrocaval node and residual activity in the treated ext iliac node.  He has seen Dr. Kathrynn Running and is planning possible additional SBRT to the new node but he is reluctant to retreat the iliac node.    He has no weight loss or bone pain.   12/05/22: Dan [name] returns today in f/u.  He had  a PSMA PET in 8/23 and had a single area of uptake in a right common iliac node.  He had SBRT to the node in 10/23.  His prior treatment had been an RALP followed by salvage RT as noted below.   We don't have a PSA prior to this visit.  His PSA was 3.4 prior to the radiation and 3.1 in December.  He has no pain or weight loss.  He is voiding ok and his UA is clear. He has minimal leakage after voiding.  There was B2 right renal cyst with rim calcification on the PET.   06/06/22: Dan [name] returns today in f/u.  His repeat cytology on 04/04/22 was negative.  He was supposed to have a PSMA PET prior to this visit but that wasn't done.  He has no bone pain and he has gained some weight.  He has had no further hematuria.    04/04/22: Dan [name] returns today in f/u for his history of gross hematuria and prostate cancer with a rising PSA post prostatectomy.  The CT hematuria study showed possible bladder wall thickening and on  my review there was a possible lesion at the left bladder base.  He had no evidence of prostate cancer mets on CT but there is uptake in a left posterior rib on bone scan.  He does report prior rib fractures.  He has some right shoulder uptake as well that is probably arthritis.  He has had no more hematuria.    03/28/22: Dan [name] is a 78 yo male with a history of prostate cancer that was bulky on the left with Gleason 8 disease on biospy for a PSA of 12.8.  Prostate volume was 28ml.  He was treated with RALP with Dr. Ileene Hutchinson in 2019 and was found to have T3b N0 M0 Gleason 7(4+3) prostate cancer on surgical path with a nadir PSA post op of 2.7 and a subsequent rise to 5.7 by 05/12/19 and an Axumin PET that only showed prostate bed uptake.  He then had radiation therapy by Dr. Brendolyn Patty for a PSA recurrence in 2020.  He is referred now with hematuria and a cytology that showed cells suspicious for high grade malignancy.  He has a  smoker.  His Cr was 1.14 on 02/01/22 and his PSA was 2.2.  It had gotten down to 0.1 after the radiation therapy.  He only had one episode of hematuria the day he saw Dr. Reuel Boom.  His UA is clear today.   He is on only an aspirin for his a fib. He has only rare incontinence.  He has post prostatectomy ED.     ROS:  Review of Systems  Constitutional:  Positive for malaise/fatigue.  Respiratory:  Positive for cough and shortness of breath.   Gastrointestinal:  Positive for constipation.  Musculoskeletal:  Positive for back pain and joint pain (right shoulder).  Neurological:  Positive for weakness.  Endo/Heme/Allergies:  Bruises/bleeds easily.  All other systems reviewed and are negative.   Allergies  Allergen Reactions   Tamsulosin Hives and Itching    Past Medical History:  Diagnosis Date   BPH (benign prostatic hyperplasia)    COPD (chronic obstructive pulmonary disease) (HCC)    Depression    ED (erectile dysfunction)    Gout    Hyperlipidemia    Hypertension    Loss of hearing    Nocturia    Paroxysmal atrial fibrillation (HCC)    Prostate cancer [facility])     Past Surgical History:  Procedure Laterality Date   CARDIOVASCULAR STRESS TEST  12/31/2004   normal perfuction nuclear study w/ no evidence ischemia /  normal LV function and wall motion , ef 52%   PROSTATECTOMY  2019   TOTAL HIP ARTHROPLASTY      Social History   Socioeconomic History   Marital status: Married    Spouse name: Not on file   Number of children: Not on file   Years of education: Not on file   Highest education level: Not on file  Occupational History   Not on file  Tobacco Use   Smoking status: Every Day    Types: Cigarettes    Start date: 06/12/1967    Passive exposure: Never   Smokeless tobacco: Never  Vaping Use   Vaping status: Never Used  Substance and Sexual Activity   Alcohol use: Not Currently   Drug use: Not Currently   Sexual activity: Not on file  Other Topics Concern    Not on file  Social History Narrative   Not on  file   Social Drivers of Health   Financial Resource Strain: Not on file  Food Insecurity: No Food Insecurity (01/14/2023)   Hunger Vital Sign    Worried About Running Out of Food in the Last Year: Never true    Ran Out of Food in the Last Year: Never true  Transportation Needs: No Transportation Needs (01/14/2023)   PRAPARE - Administrator, Civil Service (Medical): No    Lack of Transportation (Non-Medical): No  Physical Activity: Not on file  Stress: Not on file  Social Connections: Unknown (03/18/2022)   [facility]   Social Network    Social Network: Not on file  Intimate Partner Violence: Not At Risk (01/14/2023)   Humiliation, Afraid, Rape, and Kick questionnaire    Fear of Current or Ex-Partner: No    Emotionally Abused: No    Physically Abused: No    Sexually Abused: No    Family History  Problem Relation Age of Onset   Hypertension Mother    Cancer Father        lung   Colon cancer Neg Hx    Inflammatory bowel disease Neg Hx    Pancreatic cancer Neg Hx     Anti-infectives: Anti-infectives (From admission, onward)    None       Current Outpatient Medications  Medication Sig Dispense Refill   abiraterone acetate (ZYTIGA) 250 MG tablet Take 4 tablets (1,000 mg total) by mouth daily. Take on an empty stomach 1 hour before or 2 hours after a meal 120 tablet 3   albuterol (PROVENTIL HFA;VENTOLIN HFA) 108 (90 Base) MCG/ACT inhaler Inhale 2 puffs into the lungs every 6 (six) hours as needed for wheezing or shortness of breath.     albuterol (PROVENTIL) (2.5 MG/3ML) 0.083% nebulizer solution Take 2.5 mg by nebulization every 6 (six) hours as needed.     allopurinol (ZYLOPRIM) 100 MG tablet Take 100 mg by mouth daily.     Ascorbic Acid (VITAMIN C) 1000 MG tablet Take 1,000 mg by mouth daily.     aspirin EC 81 MG tablet Take 81 mg by mouth daily. Swallow whole.      cholecalciferol (VITAMIN D3) 25 MCG (1000 UNIT) tablet Take 1,000 Units by mouth daily.     colchicine 0.6 MG tablet Take 1 tablet by mouth daily as needed.     cyclobenzaprine (FLEXERIL) 10 MG tablet Take 1 tablet (10 mg total) by mouth at bedtime. One tablet every night at bedtime as needed for spasm. 30 tablet 0   diltiazem (CARDIZEM CD) 360 MG 24 hr capsule Take by mouth daily.     HYDROcodone-acetaminophen (NORCO) 10-325 MG tablet Take 1 tablet by mouth 4 (four) times daily as needed.     HYDROcodone-acetaminophen (NORCO) 10-325 MG tablet Take 1 tablet by mouth every 6 (six) hours as needed. 30 tablet 0   losartan (COZAAR) 25 MG tablet TAKE 1 TABLET (25 MG TOTAL) BY MOUTH DAILY. 90 tablet 2   magnesium oxide (MAG-OX) 400 (240 Mg) MG tablet TAKE 1 TABLET BY MOUTH EVERY DAY 90 tablet 1   megestrol (MEGACE) 40 MG/ML suspension Take by mouth.     oxyCODONE-acetaminophen (PERCOCET/ROXICET) 5-325 MG tablet One tablet every four hours as need for pain,  5 day limit. 25 tablet 0   potassium chloride (MICRO-K) 10 MEQ CR capsule TAKE 2 CAPSULES BY MOUTH DAILY. 7 capsule 0   predniSONE (DELTASONE) 5 MG tablet Take 1 tablet (5 mg  total) by mouth daily with breakfast. 90 tablet 1   predniSONE (STERAPRED UNI-PAK 21 TAB) 5 MG (21) TBPK tablet Take 6 pills first day; 5 pills second day; 4 pills third day; 3 pills fourth day; 2 pills next day and 1 pill last day. 21 tablet 0   TRELEGY ELLIPTA 200-62.5-25 MCG/ACT AEPB Inhale 1 puff into the lungs daily.     Zinc 50 MG TABS Take 1 tablet by mouth daily.     No current facility-administered medications for this visit.     Objective: Vital signs in last 24 hours: BP (!) 156/73   Pulse 85   Intake/Output from previous day: No intake/output data recorded. Intake/Output this shift: @IOTHISSHIFT @   Physical Exam Vitals reviewed.  Constitutional:      Appearance: Normal appearance.  Musculoskeletal:     Cervical back: Normal range of motion and neck  supple.  Lymphadenopathy:     Cervical: No cervical adenopathy (I don't feel a node.).  Neurological:     Mental Status: He is alert.     Lab Results:  Results for orders placed or performed in visit on 12/04/23 (from the past 24 hours)  Urinalysis, Routine w reflex microscopic     Status: None   Collection Time: 12/04/23 11:43 AM  Result Value Ref Range   Specific Gravity, UA 1.020 1.005 - 1.030   pH, UA 7.0 5.0 - 7.5   Color, UA Yellow Yellow   Appearance Ur Clear Clear   Leukocytes,UA Negative Negative   Protein,UA Trace Negative/Trace   Glucose, UA Negative Negative   Ketones, UA Negative Negative   RBC, UA Negative Negative   Bilirubin, UA Negative Negative   Urobilinogen, Ur 1.0 0.2 - 1.0 mg/dL   Nitrite, UA Negative Negative   Microscopic Examination Comment    Narrative   Performed at:  [address] [facility] [address] [place], [place]  [id number] Lab Director: Chinita Pester MT, Phone:  [phone]        BMET Recent Labs    12/03/23 1002  NA 134*  K 3.7  CL 102  CO2 20*  GLUCOSE 120*  BUN 21  CREATININE 1.08  CALCIUM 10.8*   PT/INR No results for input(s): "LABPROT", "INR" in the last 72 hours. ABG No results for input(s): "PHART", "HCO3" in the last 72 hours.  Invalid input(s): "PCO2", "PO2" Recent Results (from the past 2160 hours)  Iron and TIBC     Status: None   Collection Time: 10/13/23  9:53 AM  Result Value Ref Range   Iron 61 45 - 182 ug/dL   TIBC 865 784 - 696 ug/dL   Saturation Ratios 18 17.9 - 39.5 %   UIBC 279 ug/dL    Comment: Performed at [facility], [address], [place], [place] [address]  Ferritin     Status: None   Collection Time: 10/13/23  9:53 AM  Result Value Ref Range   Ferritin 44 24 - 336 ng/mL    Comment: Performed at [facility], [address] [place], [place] [address]  PSA     Status: None   Collection Time: 10/13/23  9:53 AM  Result Value Ref Range   Prostatic Specific Antigen <0.01 0.00 -  4.00 ng/mL    Comment: (NOTE) While PSA levels of <=4.00 ng/ml are reported as reference range, some men with levels below 4.00 ng/ml can have prostate cancer and many men with PSA above 4.00 ng/ml do not have prostate cancer.  Other  tests such as free PSA, age specific reference ranges, PSA velocity and PSA doubling time may be helpful especially in men less than 70 years old. Performed at [facility], [address], [place], [place] [address]   Comprehensive metabolic panel     Status: Abnormal   Collection Time: 10/13/23  9:54 AM  Result Value Ref Range   Sodium 137 135 - 145 mmol/L   Potassium 4.5 3.5 - 5.1 mmol/L   Chloride 104 98 - 111 mmol/L   CO2 23 22 - 32 mmol/L   Glucose, Bld 114 (H) 70 - 99 mg/dL    Comment: Glucose reference range applies only to samples taken after fasting for at least 8 hours.   BUN 21 8 - 23 mg/dL   Creatinine, Ser 6.04 0.61 - 1.24 mg/dL   Calcium 54.0 (H) 8.9 - 10.3 mg/dL   Total Protein 7.0 6.5 - 8.1 g/dL   Albumin 3.7 3.5 - 5.0 g/dL   AST 14 (L) 15 - 41 U/L   ALT 13 0 - 44 U/L   Alkaline Phosphatase 56 38 - 126 U/L   Total Bilirubin 0.3 <1.2 mg/dL   GFR, Estimated >98 >11 mL/min    Comment: (NOTE) Calculated using the CKD-EPI Creatinine Equation (2021)    Anion gap 10 5 - 15    Comment: Performed at [facility], [address] [place], [place] [address]  CBC with Differential/Platelet     Status: Abnormal   Collection Time: 10/13/23  9:54 AM  Result Value Ref Range   WBC 17.4 (H) 4.0 - 10.5 K/uL   RBC 4.18 (L) 4.22 - 5.81 MIL/uL   Hemoglobin 12.3 (L) 13.0 - 17.0 g/dL   HCT 29.5 62.1 - 30.8 %   MCV 93.8 80.0 - 100.0 fL   MCH 29.4 26.0 - 34.0 pg   MCHC 31.4 30.0 - 36.0 g/dL   RDW 65.7 (H) 84.6 - 96.2 %   Platelets 438 (H) 150 - 400 K/uL   nRBC 0.0 0.0 - 0.2 %   Neutrophils Relative % 85 %   Neutro Abs 14.8 (H) 1.7 - 7.7 K/uL   Lymphocytes Relative 8 %   Lymphs Abs 1.4 0.7 - 4.0 K/uL   Monocytes Relative 5 %   Monocytes  Absolute 0.9 0.1 - 1.0 K/uL   Eosinophils Relative 1 %   Eosinophils Absolute 0.1 0.0 - 0.5 K/uL   Basophils Relative 0 %   Basophils Absolute 0.1 0.0 - 0.1 K/uL   Immature Granulocytes 1 %   Abs Immature Granulocytes 0.14 (H) 0.00 - 0.07 K/uL    Comment: Performed at [facility], [address] [place], [place] [address]  PSA     Status: None   Collection Time: 12/03/23 10:01 AM  Result Value Ref Range   Prostatic Specific Antigen 0.01 0.00 - 4.00 ng/mL    Comment: (NOTE) While PSA levels of <=4.00 ng/ml are reported as reference range, some men with levels below 4.00 ng/ml can have prostate cancer and many men with PSA above 4.00 ng/ml do not have prostate cancer.  Other tests such as free PSA, age specific reference ranges, PSA velocity and PSA doubling time may be helpful especially in men less than 55 years old. Performed at [facility], [address] [place], [place] [address]   Lactate dehydrogenase     Status: None   Collection Time: 12/03/23 10:02 AM  Result Value Ref Range   LDH 99 98 -  192 U/L    Comment: Performed at [facility], [address], [place], [place] [address]  Magnesium     Status: None   Collection Time: 12/03/23 10:02 AM  Result Value Ref Range   Magnesium 2.1 1.7 - 2.4 mg/dL    Comment: Performed at [facility], [address] [place], [place] [address]  Comprehensive metabolic panel     Status: Abnormal   Collection Time: 12/03/23 10:02 AM  Result Value Ref Range   Sodium 134 (L) 135 - 145 mmol/L   Potassium 3.7 3.5 - 5.1 mmol/L   Chloride 102 98 - 111 mmol/L   CO2 20 (L) 22 - 32 mmol/L   Glucose, Bld 120 (H) 70 - 99 mg/dL    Comment: Glucose reference range applies only to samples taken after fasting for at least 8 hours.   BUN 21 8 - 23 mg/dL   Creatinine, Ser 4.03 0.61 - 1.24 mg/dL   Calcium 47.4 (H) 8.9 - 10.3 mg/dL   Total Protein 7.5 6.5 - 8.1 g/dL   Albumin 3.6 3.5 - 5.0 g/dL   AST 19 15 - 41 U/L   ALT 12 0 -  44 U/L   Alkaline Phosphatase 59 38 - 126 U/L   Total Bilirubin 0.9 0.0 - 1.2 mg/dL   GFR, Estimated >25 >95 mL/min    Comment: (NOTE) Calculated using the CKD-EPI Creatinine Equation (2021)    Anion gap 12 5 - 15    Comment: Performed at [facility], [address], [place], [place] [address]  CBC with Differential     Status: Abnormal   Collection Time: 12/03/23 10:02 AM  Result Value Ref Range   WBC 24.1 (H) 4.0 - 10.5 K/uL   RBC 3.92 (L) 4.22 - 5.81 MIL/uL   Hemoglobin 11.3 (L) 13.0 - 17.0 g/dL   HCT 64.3 (L) 32.9 - 51.8 %   MCV 89.5 80.0 - 100.0 fL   MCH 28.8 26.0 - 34.0 pg   MCHC 32.2 30.0 - 36.0 g/dL   RDW 84.1 66.0 - 63.0 %   Platelets 493 (H) 150 - 400 K/uL   nRBC 0.0 0.0 - 0.2 %   Neutrophils Relative % 91 %   Neutro Abs 21.8 (H) 1.7 - 7.7 K/uL   Lymphocytes Relative 4 %   Lymphs Abs 1.0 0.7 - 4.0 K/uL   Monocytes Relative 4 %   Monocytes Absolute 1.0 0.1 - 1.0 K/uL   Eosinophils Relative 0 %   Eosinophils Absolute 0.0 0.0 - 0.5 K/uL   Basophils Relative 0 %   Basophils Absolute 0.0 0.0 - 0.1 K/uL   Immature Granulocytes 1 %   Abs Immature Granulocytes 0.23 (H) 0.00 - 0.07 K/uL    Comment: Performed at [facility], [address], [place], [place] [address]  Urinalysis, Routine w reflex microscopic     Status: None   Collection Time: 12/04/23 11:43 AM  Result Value Ref Range   Specific Gravity, UA 1.020 1.005 - 1.030   pH, UA 7.0 5.0 - 7.5   Color, UA Yellow Yellow   Appearance Ur Clear Clear   Leukocytes,UA Negative Negative   Protein,UA Trace Negative/Trace   Glucose, UA Negative Negative   Ketones, UA Negative Negative   RBC, UA Negative Negative   Bilirubin, UA Negative Negative   Urobilinogen, Ur 1.0 0.2 - 1.0 mg/dL   Nitrite, UA Negative Negative   Microscopic Examination Comment     Comment: Microscopic not indicated and not performed.  UA is clear.  Studies/Results: MR HIP RIGHT W WO CONTRAST Result Date: 11/30/2023 CLINICAL DATA:  Right  hip pain, history of prostate cancer EXAM: MRI OF THE RIGHT HIP WITHOUT AND WITH CONTRAST TECHNIQUE: Multiplanar, multisequence MR imaging was performed both before and after administration of intravenous contrast. CONTRAST:  6.27mL GADAVIST GADOBUTROL 1 MMOL/ML IV SOLN COMPARISON:  None Available. FINDINGS: Bones: Left total hip arthroplasty with susceptibility artifact partially obscuring the adjacent soft tissue and osseous structures. No periarticular fluid collection or osteolysis. Avascular necrosis of the superior right femoral head with surrounding marrow edema and a nondisplaced, nondepressed subchondral fracture. No other fracture or dislocation. No periosteal reaction or bone destruction. No aggressive osseous lesion. Chronic left sacral insufficiency fracture. No SI joint widening or erosive changes. Articular cartilage and labrum Articular cartilage: High-grade partial-thickness cartilage loss of the right femoral head and acetabulum. Labrum: Degenerative tearing of the right anterosuperior labrum extending into the superior labrum. Joint or bursal effusion Joint effusion: Large right hip joint effusion with synovitis. No SI joint effusion. Bursae:  No bursal fluid. Muscles and tendons Flexors: Normal. Extensors: Normal. Abductors: Normal. Adductors: Normal. Gluteals: Normal. Hamstrings: Normal. Other findings No pelvic free fluid. No fluid collection or hematoma. No inguinal lymphadenopathy. No inguinal hernia. Prior prostatectomy. IMPRESSION: 1. Avascular necrosis of the superior right femoral head with surrounding marrow edema and a nondisplaced, nondepressed subchondral fracture. 2. Moderate-severe osteoarthritis of the right hip. Large right hip joint effusion with synovitis. 3. Degenerative tearing of the right anterosuperior labrum extending into the superior labrum. 4. No aggressive osseous lesion to suggest metastatic disease. Electronically Signed   By: Elige Ko M.D.   On: 11/30/2023 17:00    MR Lumbar Spine W Wo Contrast Result Date: 11/30/2023 CLINICAL DATA:  Chronic low back pain. History of prostate cancer. Right hip pain. EXAM: MRI LUMBAR SPINE WITHOUT AND WITH CONTRAST TECHNIQUE: Multiplanar and multiecho pulse sequences of the lumbar spine were obtained without and with intravenous contrast. CONTRAST:  6.42mL GADAVIST GADOBUTROL 1 MMOL/ML IV SOLN COMPARISON:  Radiography 10/22/2023 FINDINGS: Segmentation:  5 lumbar type vertebral bodies. Alignment:  No malalignment. Vertebrae: Diffuse fatty change of the marrow suggesting regional radiation. Few small endplate Schmorl's nodes. No actual compression fracture. No sign of bony metastatic disease in the region. Conus medullaris and cauda equina: Conus extends to the L1 level. Conus and cauda equina appear normal. Paraspinal and other soft tissues: Chronic complex lesion of the right kidney measuring up to 6 cm in diameter, present on the previous PET scan without hypermetabolism, therefore presumably benign. Disc levels: No significant disc level finding at L3-4 or above. No stenosis or neural compression. L4-5: Chronic disc degeneration with loss of disc height. Endplate osteophytes and bulging of the disc. Facet and ligamentous hypertrophy. Mild multifactorial stenosis at this level but without visible neural compression. L5-S1: Chronic disc degeneration with loss of disc height. Central to left posterolateral disc herniation with mild mass-effect upon the left S1 nerve. This could be symptomatic on the left. No right-sided pathology evident. IMPRESSION: 1. Diffuse fatty change of the marrow suggesting regional radiation. No sign of bony metastatic disease. 2. L4-5: Chronic disc degeneration with loss of disc height. Endplate osteophytes and bulging of the disc. Facet and ligamentous hypertrophy. Mild multifactorial stenosis but without visible neural compression. 3. L5-S1: Chronic disc degeneration with loss of disc height. Central to left  posterolateral disc herniation with mild mass-effect upon the left S1 nerve. This could be symptomatic on the left. Electronically Signed  By: Paulina Fusi M.D.   On: 11/30/2023 13:16   DG Lumbar Spine Complete Result Date: 10/22/2023 Clinical:  lower back pain, right sided sciatica, no trauma, history of prostate cancer. X-rays were done of the lumbar spine, five views. There is normal lumbar lordosis present.  There is narrowing at L4-L5 with anterior osteophytes.  No fracture or defect noted. Bipolar hip left.  Arteriosclerosis is present of distal aorta.  Bone quality is good. Impression:  degenerative changes lower lumbar spine L4-L5, no acute fracture noted. Electronically Signed Darreld Mclean, MD 12/18/20249:14 AM  DG HIP UNILAT W OR W/O PELVIS 2-3 VIEWS RIGHT Result Date: 10/22/2023 Clinical:  pain right hip, no trauma.  History of cancer of prostate. X-rays were done of the right hip, three views. There is a bipolar hip on the left in good position and alignment.  The right hip has flattening of the superior dome of the femur head early.  There is a small avulsion fracture of the pelvic ring ischium just inferior to the acetabulum.  I see no defects in pelvis.  Bone quality is good. Impression:  post bipolar hip on the left; right hip with degenerative changes and early flattening of femoral head, small avulsion fracture of pelvic ring laterally. Electronically Signed Darreld Mclean, MD 12/18/20249:11 AM   Notes from Dr. Ellin Saba reviewed.      Assessment/Plan: Prostate cancer with metastases to external iliac and retrocaval nodes s/p RALP and salvage radiation therapy.  His PSA is undetectible on current therapy.   He will continue the firmagon and abiaterone.  He will return to see me in 3 months with a PSA which has been ordered by Dr. Ellin Saba.      LUTS and SUI.  He has moderate LUTS with SUI.     Right cervical adenopathy.  I don't feel anything today.  He improved with  antibiotics.  I have let Dr. Ellin Saba know and he will follow up on that.   No orders of the defined types were placed in this encounter.    Orders Placed This Encounter  Procedures   Urinalysis, Routine w reflex microscopic     Return in about 3 months (around 03/03/2024).    CC: Dr. [name].      Bjorn Pippin 12/05/2023 [phone]

## 2023-12-05 ENCOUNTER — Other Ambulatory Visit: Payer: Self-pay

## 2023-12-05 DIAGNOSIS — R221 Localized swelling, mass and lump, neck: Secondary | ICD-10-CM

## 2023-12-07 NOTE — Progress Notes (Signed)
[facility] [address], [place] [address]    Clinic Day:  12/08/2023  Referring physician: Richardean Chimera, MD  Patient Care Team: Richardean Chimera, MD as PCP - General (Family Medicine) Wyline Mood Dorothe Pea, MD as PCP - Cardiology (Cardiology) Jena Gauss Gerrit Friends, MD as Consulting Physician (Gastroenterology) Doreatha Massed, MD as Medical Oncologist (Medical Oncology) Therese Sarah, RN as Oncology Nurse Navigator (Medical Oncology)   ASSESSMENT & PLAN:   Assessment: 1.  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 Ileene Hutchinson on 09/22/2018 - Final pathology: PT3b N0, Gleason 4+3 prostate adenocarcinoma, postoperative PSA remained detectable and rose to 5.7 - Axumin PET (06/2019): Local recurrence - Salvage XRT to the prostate fossa and pelvic lymph nodes, Dr. Brendolyn Patty from 07/05/2019 - 08/31/2019, without concurrent ADT.  PSA responded well reaching a nadir of 0.1. - PSA rose again to 3.4 in 06/2022, PSMA PET scan: Oligometastatic disease involving single internal iliac lymph node. - 07/29/2022 - 08/09/2022: 10 fx course of UHRT to right internal iliac node, PSA only decreased slightly to 3.1 in 10/2022, further went up to 5 on 12/05/2022 - PSMA PET (12/19/2022): New radiotracer avid 2 mm infrarenal retrocaval lymph node and a minimal increase size of the previously treated right common iliac lymph node measuring 3 mm compared to 2 mm previously, no evidence of recurrence in the prostatectomy bed or bone mets. - Loading dose of Firmagon with Dr. Annabell Howells on 01/09/2023 - Abiraterone and prednisone started on 01/18/2023. - XRT to the retrocaval lymph node and right common iliac lymph node completed on 01/29/2023, 10 fractions   2.  Social/family history: - He lives with wife at home.  He is independent of all ADLs and IADLs.  Uses cane for longer distances.  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  Current active smoker, 1 pack/day for 45 years. - Father had lung cancer and was smoker.    Plan: 1.  Metastatic CSPC to abdominal/high pelvic lymph node (low-volume): - He is tolerating abiraterone and prednisone very well. - We reviewed his labs from 12/03/2023: Normal LFTs.  CBC grossly normal.  PSA is 0.01. - Continue abiraterone 500 mg daily and prednisone 5 mg daily. - He has consistently elevated white count and platelet count, likely from prednisone.  However will check for BCR/ABL by FISH and JAK2 V617F with reflex to rule out myeloproliferative neoplasms. - Continue Firmagon monthly with Dr. Annabell Howells.  Last Deborra Medina was on 11/20/2023. - Patient wife requests second opinion at [facility].  Will make a referral. - Will consider low-dose CT scan for lung cancer screening this year.  Last year his PSMA PET scan did not show any lung lesions. - He also reports worsening shortness of breath.  He is an active smoker.  Will make a referral to pulmonology for COPD management.   2.  Iron deficiency state: - Due to severe fatigue and ferritin level of 44 and hemoglobin of 12.3, he received INFeD 1 g on 12/02/2023.  He still has some fatigue.   3.  Mild hypercalcemia: - He is not on calcium supplements.  He has mild hypercalcemia which is stable at 10.8.   4.  Osteoporosis (DEXA on 03/17/2023 T score -4): - He received first Prolia on 04/03/2023.  It was stopped as he did not wish to continue it.  5.  Right neck mass: -  He reported noticing right neck swelling/mass 3 to 4 weeks ago when he was shaving. - He was evaluated at Dr. Rosann Auerbach office.  An in office ultrasound showed well-circumscribed hypervascular solid mass measuring 3 cm. - Examination today: Mass palpable in the right parotid region.  Differential diagnosis includes benign parotid tumor.  Denies any dysphagia or odynophagia.  Oropharynx exam was normal.  He is a current active smoker. - Recommend CT soft  tissue neck with contrast.  We will see if he needs biopsy based on CT findings.    Orders Placed This Encounter  Procedures   CT SOFT TISSUE NECK W CONTRAST    Standing Status:   Future    Expected Date:   12/15/2023    Expiration Date:   12/07/2024    If indicated for the ordered procedure, I authorize the administration of contrast media per Radiology protocol:   Yes    Does the patient have a contrast media/X-ray dye allergy?:   No    Preferred imaging location?:   [facility]   BCR-ABL1 FISH    Standing Status:   Future    Number of Occurrences:   1    Expected Date:   12/08/2023    Expiration Date:   12/07/2024   JAK2 V617F rfx CALR/MPL/E12-15    Standing Status:   Future    Number of Occurrences:   1    Expected Date:   12/08/2023    Expiration Date:   12/07/2024   Sedimentation rate    Standing Status:   Future    Number of Occurrences:   1    Expected Date:   12/08/2023    Expiration Date:   12/07/2024   C-reactive protein    Standing Status:   Future    Number of Occurrences:   1    Expected Date:   12/08/2023    Expiration Date:   12/07/2024      Dan [name],acting as a scribe for Doreatha Massed, MD.,have documented all relevant documentation on the behalf of Doreatha Massed, MD,as directed by  Doreatha Massed, MD while in the presence of Doreatha Massed, MD.  I, Doreatha Massed MD, have reviewed the above documentation for accuracy and completeness, and I agree with the above.    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 RALP 09/22/18 (Dr. Ileene Hutchinson) 2. Salvage XRT + ADT 07/05/2019 -  08/31/2019 (Dr. Brendolyn Patty) 3. UHRT to right internal iliac node 07/29/22 - 08/09/22 4. UHRT to infrarenal retrocaval lymph node 01/16/23 - 01/29/23  Current Therapy:  Deborra Medina and Abiraterone    HISTORY OF PRESENT ILLNESS:   Oncology History   No history exists.     INTERVAL HISTORY:   Dan [name] is a 78 y.o. 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 He was last seen by me on 10/20/23.  Today, he states that he is doing well overall. His appetite level is at 60%. His energy level is at 10%.  PAST MEDICAL HISTORY:   Past Medical History: Past Medical History:  Diagnosis Date   BPH (benign prostatic  hyperplasia)    COPD (chronic obstructive pulmonary disease) (HCC)    Depression    ED (erectile dysfunction)    Gout    Hyperlipidemia    Hypertension    Loss of hearing    Nocturia    Paroxysmal atrial fibrillation (HCC)    Prostate cancer [facility])     Surgical History: Past Surgical History:  Procedure Laterality Date   CARDIOVASCULAR STRESS TEST  12/31/2004   normal perfuction nuclear study w/ no evidence ischemia /  normal LV function and wall motion , ef 52%   PROSTATECTOMY  2019   TOTAL HIP ARTHROPLASTY      Social History: Social History   Socioeconomic History   Marital status: Married    Spouse name: Not on file   Number of children: Not on file   Years of education: Not on file   Highest education level: Not on file  Occupational History   Not on file  Tobacco Use   Smoking status: Every Day    Types: Cigarettes    Start date: 06/12/1967    Passive exposure: Never   Smokeless tobacco: Never  Vaping Use   Vaping status: Never Used  Substance and Sexual Activity   Alcohol use: Not Currently   Drug use: Not Currently   Sexual activity: Not on file  Other Topics Concern   Not on file  Social History Narrative   Not on file   Social Drivers of Health   Financial Resource Strain: Not on  file  Food Insecurity: No Food Insecurity (01/14/2023)   Hunger Vital Sign    Worried About Running Out of Food in the Last Year: Never true    Ran Out of Food in the Last Year: Never true  Transportation Needs: No Transportation Needs (01/14/2023)   PRAPARE - Administrator, Civil Service (Medical): No    Lack of Transportation (Non-Medical): No  Physical Activity: Not on file  Stress: Not on file  Social Connections: Unknown (03/18/2022)   Received from [facility], [facility]   Social Network    Social Network: Not on file  Intimate Partner Violence: Not At Risk (01/14/2023)   Humiliation, Afraid, Rape, and Kick questionnaire    Fear of Current or Ex-Partner: No    Emotionally Abused: No    Physically Abused: No    Sexually Abused: No    Family History: Family History  Problem Relation Age of Onset   Hypertension Mother    Cancer Father        lung   Colon cancer Neg Hx    Inflammatory bowel disease Neg Hx    Pancreatic cancer Neg Hx     Current Medications:  Current Outpatient Medications:    abiraterone acetate (ZYTIGA) 250 MG tablet, Take 4 tablets (1,000 mg total) by mouth daily. Take on an empty stomach 1 hour before or 2 hours after a meal, Disp: 120 tablet, Rfl: 3   albuterol (PROVENTIL HFA;VENTOLIN HFA) 108 (90 Base) MCG/ACT inhaler, Inhale 2 puffs into the lungs every 6 (six) hours as needed for wheezing or shortness of breath., Disp: , Rfl:    albuterol (PROVENTIL) (2.5 MG/3ML) 0.083% nebulizer solution, Take 2.5 mg by nebulization every 6 (six) hours as needed., Disp: , Rfl:    allopurinol (ZYLOPRIM) 100 MG tablet, Take 100 mg by mouth daily., Disp: , Rfl:    Ascorbic Acid (VITAMIN C) 1000 MG tablet, Take 1,000 mg by mouth daily., Disp: , Rfl:  aspirin EC 81 MG tablet, Take 81 mg by mouth daily. Swallow whole., Disp: , Rfl:    cholecalciferol (VITAMIN D3) 25 MCG (1000 UNIT) tablet, Take 1,000 Units by mouth daily., Disp: , Rfl:    colchicine 0.6  MG tablet, Take 1 tablet by mouth daily as needed., Disp: , Rfl:    cyclobenzaprine (FLEXERIL) 10 MG tablet, Take 1 tablet (10 mg total) by mouth at bedtime. One tablet every night at bedtime as needed for spasm., Disp: 30 tablet, Rfl: 0   diltiazem (CARDIZEM CD) 360 MG 24 hr capsule, Take by mouth daily., Disp: , Rfl:    HYDROcodone-acetaminophen (NORCO) 10-325 MG tablet, Take 1 tablet by mouth 4 (four) times daily as needed., Disp: , Rfl:    HYDROcodone-acetaminophen (NORCO) 10-325 MG tablet, Take 1 tablet by mouth every 6 (six) hours as needed., Disp: 30 tablet, Rfl: 0   losartan (COZAAR) 25 MG tablet, TAKE 1 TABLET (25 MG TOTAL) BY MOUTH DAILY., Disp: 90 tablet, Rfl: 2   magnesium oxide (MAG-OX) 400 (240 Mg) MG tablet, TAKE 1 TABLET BY MOUTH EVERY DAY, Disp: 90 tablet, Rfl: 1   megestrol (MEGACE) 40 MG/ML suspension, Take by mouth., Disp: , Rfl:    oxyCODONE-acetaminophen (PERCOCET/ROXICET) 5-325 MG tablet, One tablet every four hours as need for pain,  5 day limit., Disp: 25 tablet, Rfl: 0   potassium chloride (MICRO-K) 10 MEQ CR capsule, TAKE 2 CAPSULES BY MOUTH DAILY., Disp: 7 capsule, Rfl: 0   predniSONE (DELTASONE) 5 MG tablet, Take 1 tablet (5 mg total) by mouth daily with breakfast., Disp: 90 tablet, Rfl: 1   predniSONE (STERAPRED UNI-PAK 21 TAB) 5 MG (21) TBPK tablet, Take 6 pills first day; 5 pills second day; 4 pills third day; 3 pills fourth day; 2 pills next day and 1 pill last day., Disp: 21 tablet, Rfl: 0   TRELEGY ELLIPTA 200-62.5-25 MCG/ACT AEPB, Inhale 1 puff into the lungs daily., Disp: , Rfl:    Zinc 50 MG TABS, Take 1 tablet by mouth daily., Disp: , Rfl:    Allergies: Allergies  Allergen Reactions   Tamsulosin Hives and Itching    REVIEW OF SYSTEMS:   Review of Systems  Constitutional:  Negative for chills, fatigue and fever.  HENT:   Negative for lump/mass, mouth sores, nosebleeds, sore throat and trouble swallowing.   Eyes:  Negative for eye problems.   Respiratory:  Negative for cough and shortness of breath.   Cardiovascular:  Negative for chest pain, leg swelling and palpitations.  Gastrointestinal:  Negative for abdominal pain, constipation, diarrhea, nausea and vomiting.  Genitourinary:  Negative for bladder incontinence, difficulty urinating, dysuria, frequency, hematuria and nocturia.   Musculoskeletal:  Negative for arthralgias, back pain, flank pain, myalgias and neck pain.  Skin:  Negative for itching and rash.  Neurological:  Negative for dizziness, headaches and numbness.  Hematological:  Does not bruise/bleed easily.  Psychiatric/Behavioral:  Negative for depression, sleep disturbance and suicidal ideas. The patient is not nervous/anxious.   All other systems reviewed and are negative.    VITALS:   Blood pressure (!) 133/54, pulse 79, temperature 98.1 F (36.7 C), temperature source Tympanic, resp. rate 20, weight 150 lb 2.1 oz (68.1 kg), SpO2 99%.  Wt Readings from Last 3 Encounters:  12/08/23 150 lb 2.1 oz (68.1 kg)  12/03/23 152 lb (68.9 kg)  12/02/23 154 lb 9.6 oz (70.1 kg)    Body mass index is 21.54 kg/m.  Performance status (ECOG): 1 - Symptomatic but  completely ambulatory  PHYSICAL EXAM:   Physical Exam Vitals and nursing note reviewed. Exam conducted with a chaperone present.  Constitutional:      Appearance: Normal appearance.  Cardiovascular:     Rate and Rhythm: Normal rate and regular rhythm.     Pulses: Normal pulses.     Heart sounds: Normal heart sounds.  Pulmonary:     Effort: Pulmonary effort is normal.     Breath sounds: Normal breath sounds.  Abdominal:     Palpations: Abdomen is soft. There is no hepatomegaly, splenomegaly or mass.     Tenderness: There is no abdominal tenderness.  Musculoskeletal:     Right lower leg: No edema.     Left lower leg: No edema.  Lymphadenopathy:     Cervical: No cervical adenopathy.     Right cervical: No superficial, deep or posterior cervical  adenopathy.    Left cervical: No superficial, deep or posterior cervical adenopathy.     Upper Body:     Right upper body: No supraclavicular or axillary adenopathy.     Left upper body: No supraclavicular or axillary adenopathy.  Neurological:     General: No focal deficit present.     Mental Status: He is alert and oriented to person, place, and time.  Psychiatric:        Mood and Affect: Mood normal.        Behavior: Behavior normal.     LABS:      Latest Ref Rng & Units 12/03/2023   10:02 AM 10/13/2023    9:54 AM 07/09/2023   10:56 AM  CBC  WBC 4.0 - 10.5 K/uL 24.1  17.4  14.1   Hemoglobin 13.0 - 17.0 g/dL 16.1  09.6  04.5   Hematocrit 39.0 - 52.0 % 35.1  39.2  36.7   Platelets 150 - 400 K/uL 493  438  453       Latest Ref Rng & Units 12/03/2023   10:02 AM 10/13/2023    9:54 AM 08/14/2023   10:38 AM  CMP  Glucose 70 - 99 mg/dL 409  811  914   BUN 8 - 23 mg/dL 21  21  23    Creatinine 0.61 - 1.24 mg/dL 7.82  9.56  2.13   Sodium 135 - 145 mmol/L 134  137  137   Potassium 3.5 - 5.1 mmol/L 3.7  4.5  4.6   Chloride 98 - 111 mmol/L 102  104  98   CO2 22 - 32 mmol/L 20  23  18    Calcium 8.9 - 10.3 mg/dL 08.6  57.8  46.9   Total Protein 6.5 - 8.1 g/dL 7.5  7.0    Total Bilirubin 0.0 - 1.2 mg/dL 0.9  0.3    Alkaline Phos 38 - 126 U/L 59  56    AST 15 - 41 U/L 19  14    ALT 0 - 44 U/L 12  13       No results found for: "CEA1", "CEA" / No results found for: "CEA1", "CEA" Lab Results  Component Value Date   PSA1 <0.1 08/14/2023   No results found for: "GEX528" No results found for: "CAN125"  No results found for: "TOTALPROTELP", "ALBUMINELP", "A1GS", "A2GS", "BETS", "BETA2SER", "GAMS", "MSPIKE", "SPEI" Lab Results  Component Value Date   TIBC 340 10/13/2023   TIBC 343 07/09/2023   FERRITIN 44 10/13/2023   FERRITIN 51 07/09/2023   IRONPCTSAT 18 10/13/2023   IRONPCTSAT 20 07/09/2023   Lab  Results  Component Value Date   LDH 99 12/03/2023     STUDIES:   MR HIP  RIGHT W WO CONTRAST Result Date: 11/30/2023 CLINICAL DATA:  Right hip pain, history of prostate cancer EXAM: MRI OF THE RIGHT HIP WITHOUT AND WITH CONTRAST TECHNIQUE: Multiplanar, multisequence MR imaging was performed both before and after administration of intravenous contrast. CONTRAST:  6.27mL GADAVIST GADOBUTROL 1 MMOL/ML IV SOLN COMPARISON:  None Available. FINDINGS: Bones: Left total hip arthroplasty with susceptibility artifact partially obscuring the adjacent soft tissue and osseous structures. No periarticular fluid collection or osteolysis. Avascular necrosis of the superior right femoral head with surrounding marrow edema and a nondisplaced, nondepressed subchondral fracture. No other fracture or dislocation. No periosteal reaction or bone destruction. No aggressive osseous lesion. Chronic left sacral insufficiency fracture. No SI joint widening or erosive changes. Articular cartilage and labrum Articular cartilage: High-grade partial-thickness cartilage loss of the right femoral head and acetabulum. Labrum: Degenerative tearing of the right anterosuperior labrum extending into the superior labrum. Joint or bursal effusion Joint effusion: Large right hip joint effusion with synovitis. No SI joint effusion. Bursae:  No bursal fluid. Muscles and tendons Flexors: Normal. Extensors: Normal. Abductors: Normal. Adductors: Normal. Gluteals: Normal. Hamstrings: Normal. Other findings No pelvic free fluid. No fluid collection or hematoma. No inguinal lymphadenopathy. No inguinal hernia. Prior prostatectomy. IMPRESSION: 1. Avascular necrosis of the superior right femoral head with surrounding marrow edema and a nondisplaced, nondepressed subchondral fracture. 2. Moderate-severe osteoarthritis of the right hip. Large right hip joint effusion with synovitis. 3. Degenerative tearing of the right anterosuperior labrum extending into the superior labrum. 4. No aggressive osseous lesion to suggest metastatic disease.  Electronically Signed   By: Elige Ko M.D.   On: 11/30/2023 17:00   MR Lumbar Spine W Wo Contrast Result Date: 11/30/2023 CLINICAL DATA:  Chronic low back pain. History of prostate cancer. Right hip pain. EXAM: MRI LUMBAR SPINE WITHOUT AND WITH CONTRAST TECHNIQUE: Multiplanar and multiecho pulse sequences of the lumbar spine were obtained without and with intravenous contrast. CONTRAST:  6.32mL GADAVIST GADOBUTROL 1 MMOL/ML IV SOLN COMPARISON:  Radiography 10/22/2023 FINDINGS: Segmentation:  5 lumbar type vertebral bodies. Alignment:  No malalignment. Vertebrae: Diffuse fatty change of the marrow suggesting regional radiation. Few small endplate Schmorl's nodes. No actual compression fracture. No sign of bony metastatic disease in the region. Conus medullaris and cauda equina: Conus extends to the L1 level. Conus and cauda equina appear normal. Paraspinal and other soft tissues: Chronic complex lesion of the right kidney measuring up to 6 cm in diameter, present on the previous PET scan without hypermetabolism, therefore presumably benign. Disc levels: No significant disc level finding at L3-4 or above. No stenosis or neural compression. L4-5: Chronic disc degeneration with loss of disc height. Endplate osteophytes and bulging of the disc. Facet and ligamentous hypertrophy. Mild multifactorial stenosis at this level but without visible neural compression. L5-S1: Chronic disc degeneration with loss of disc height. Central to left posterolateral disc herniation with mild mass-effect upon the left S1 nerve. This could be symptomatic on the left. No right-sided pathology evident. IMPRESSION: 1. Diffuse fatty change of the marrow suggesting regional radiation. No sign of bony metastatic disease. 2. L4-5: Chronic disc degeneration with loss of disc height. Endplate osteophytes and bulging of the disc. Facet and ligamentous hypertrophy. Mild multifactorial stenosis but without visible neural compression. 3. L5-S1:  Chronic disc degeneration with loss of disc height. Central to left posterolateral disc herniation with mild mass-effect upon the  left S1 nerve. This could be symptomatic on the left. Electronically Signed   By: Paulina Fusi M.D.   On: 11/30/2023 13:16

## 2023-12-08 ENCOUNTER — Telehealth: Payer: Self-pay | Admitting: Orthopaedic Surgery

## 2023-12-08 ENCOUNTER — Inpatient Hospital Stay: Payer: HMO | Attending: Hematology | Admitting: Hematology

## 2023-12-08 ENCOUNTER — Inpatient Hospital Stay: Payer: HMO

## 2023-12-08 VITALS — BP 133/54 | HR 79 | Temp 98.1°F | Resp 20 | Wt 150.1 lb

## 2023-12-08 DIAGNOSIS — F17210 Nicotine dependence, cigarettes, uncomplicated: Secondary | ICD-10-CM | POA: Insufficient documentation

## 2023-12-08 DIAGNOSIS — Z7952 Long term (current) use of systemic steroids: Secondary | ICD-10-CM | POA: Insufficient documentation

## 2023-12-08 DIAGNOSIS — M810 Age-related osteoporosis without current pathological fracture: Secondary | ICD-10-CM | POA: Diagnosis not present

## 2023-12-08 DIAGNOSIS — Z79899 Other long term (current) drug therapy: Secondary | ICD-10-CM | POA: Diagnosis not present

## 2023-12-08 DIAGNOSIS — M545 Low back pain, unspecified: Secondary | ICD-10-CM

## 2023-12-08 DIAGNOSIS — D75839 Thrombocytosis, unspecified: Secondary | ICD-10-CM

## 2023-12-08 DIAGNOSIS — D72828 Other elevated white blood cell count: Secondary | ICD-10-CM | POA: Diagnosis not present

## 2023-12-08 DIAGNOSIS — Z801 Family history of malignant neoplasm of trachea, bronchus and lung: Secondary | ICD-10-CM | POA: Diagnosis not present

## 2023-12-08 DIAGNOSIS — C775 Secondary and unspecified malignant neoplasm of intrapelvic lymph nodes: Secondary | ICD-10-CM | POA: Insufficient documentation

## 2023-12-08 DIAGNOSIS — C61 Malignant neoplasm of prostate: Secondary | ICD-10-CM | POA: Diagnosis not present

## 2023-12-08 DIAGNOSIS — G8929 Other chronic pain: Secondary | ICD-10-CM

## 2023-12-08 DIAGNOSIS — Z923 Personal history of irradiation: Secondary | ICD-10-CM | POA: Insufficient documentation

## 2023-12-08 DIAGNOSIS — E611 Iron deficiency: Secondary | ICD-10-CM | POA: Diagnosis not present

## 2023-12-08 DIAGNOSIS — R221 Localized swelling, mass and lump, neck: Secondary | ICD-10-CM | POA: Diagnosis not present

## 2023-12-08 LAB — C-REACTIVE PROTEIN: CRP: 6.6 mg/dL — ABNORMAL HIGH (ref <1.0)

## 2023-12-08 LAB — SEDIMENTATION RATE: Sed Rate: 91 mm/h — ABNORMAL HIGH (ref 0–16)

## 2023-12-08 NOTE — Telephone Encounter (Signed)
Dr. Sanjuan Dame pt - spoke w/the pt's spouse, she state the pain management clinic is requiring a separate referral.  Referral needs to be faxed to [phone] Dr. Lorrine Kin.  [phone]

## 2023-12-08 NOTE — Patient Instructions (Addendum)
[facility] at [facility] Discharge Instructions   You were seen and examined today by Dr. Ellin Saba.  He reviewed the results of your lab work which are normal/stable.   We will arrange for you to have a scan to properly investigate this area on your neck.   We will see you back after the scan to review the results.   Return as scheduled.    Thank you for choosing [facility] at [facility] to provide your oncology and hematology care.  To afford each patient quality time with our provider, please arrive at least 15 minutes before your scheduled appointment time.   If you have a lab appointment with the Cancer Center please come in thru the Main Entrance and check in at the main information desk.  You need to re-schedule your appointment should you arrive 10 or more minutes late.  We strive to give you quality time with our providers, and arriving late affects you and other patients whose appointments are after yours.  Also, if you no show three or more times for appointments you may be dismissed from the clinic at the providers discretion.     Again, thank you for choosing [facility].  Our hope is that these requests will decrease the amount of time that you wait before being seen by our physicians.       _____________________________________________________________  Should you have questions after your visit to [facility], please contact our office at [phone] and follow the prompts.  Our office hours are 8:00 a.m. and 4:30 p.m. Monday - Friday.  Please note that voicemails left after 4:00 p.m. may not be returned until the following business day.  We are closed weekends and major holidays.  You do have access to a nurse 24-7, just call the main number to the clinic [phone] and do not press any options, hold on the line and a nurse will answer the phone.    For prescription refill requests, have your  pharmacy contact our office and allow 72 hours.    Due to Covid, you will need to wear a mask upon entering the hospital. If you do not have a mask, a mask will be given to you at the Main Entrance upon arrival. For doctor visits, patients may have 1 support person age 41 or older with them. For treatment visits, patients can not have anyone with them due to social distancing guidelines and our immunocompromised population.

## 2023-12-09 ENCOUNTER — Encounter: Payer: Self-pay | Admitting: Orthopaedic Surgery

## 2023-12-09 DIAGNOSIS — R221 Localized swelling, mass and lump, neck: Secondary | ICD-10-CM

## 2023-12-09 DIAGNOSIS — M545 Low back pain, unspecified: Secondary | ICD-10-CM

## 2023-12-09 DIAGNOSIS — G8929 Other chronic pain: Secondary | ICD-10-CM

## 2023-12-09 DIAGNOSIS — M150 Primary generalized (osteo)arthritis: Secondary | ICD-10-CM

## 2023-12-09 NOTE — Telephone Encounter (Signed)
Dr. Sanjuan Dame pt - Dan [name] lvm for this patient now stating that the neurosurgery place has not received a referral.  She stated "This man has cancer and is in a lot of pain."  She is requesting a call back.  [phone]

## 2023-12-09 NOTE — Telephone Encounter (Signed)
Faxed new information to the [place] [facility] today at 3:15

## 2023-12-12 DIAGNOSIS — I10 Essential (primary) hypertension: Secondary | ICD-10-CM | POA: Diagnosis not present

## 2023-12-12 DIAGNOSIS — Z6823 Body mass index (BMI) 23.0-23.9, adult: Secondary | ICD-10-CM | POA: Diagnosis not present

## 2023-12-12 DIAGNOSIS — J449 Chronic obstructive pulmonary disease, unspecified: Secondary | ICD-10-CM | POA: Diagnosis not present

## 2023-12-12 DIAGNOSIS — C61 Malignant neoplasm of prostate: Secondary | ICD-10-CM | POA: Diagnosis not present

## 2023-12-12 DIAGNOSIS — I4891 Unspecified atrial fibrillation: Secondary | ICD-10-CM | POA: Diagnosis not present

## 2023-12-12 LAB — JAK2 V617F RFX CALR/MPL/E12-15

## 2023-12-12 LAB — BCR-ABL1 FISH
Cells Analyzed: 200
Cells Counted: 200

## 2023-12-12 LAB — CALR +MPL + E12-E15  (REFLEX)

## 2023-12-15 ENCOUNTER — Encounter: Payer: Self-pay | Admitting: Family Medicine

## 2023-12-15 NOTE — Telephone Encounter (Signed)
**Note De-identified  [place] Obfuscation** Please advise 

## 2023-12-16 ENCOUNTER — Other Ambulatory Visit: Payer: Self-pay

## 2023-12-16 DIAGNOSIS — C61 Malignant neoplasm of prostate: Secondary | ICD-10-CM

## 2023-12-16 MED ORDER — DEGARELIX ACETATE 80 MG ~~LOC~~ SOLR
80.0000 mg | SUBCUTANEOUS | Status: AC
  Administered 2023-12-18 – 2024-07-16 (×6): 80 mg via SUBCUTANEOUS

## 2023-12-18 ENCOUNTER — Ambulatory Visit: Payer: HMO

## 2023-12-18 DIAGNOSIS — C61 Malignant neoplasm of prostate: Secondary | ICD-10-CM | POA: Diagnosis not present

## 2023-12-18 NOTE — Progress Notes (Signed)
Firmagon Sub Q Injection  Due to Prostate Cancer patient is present today for a Firmagon Injection.  Order received and reviewed and authorization verification reviewed.   Medication: Dan [name] (Degarelix)  Dose: 80mg  Location: right upper abdomen cleaned and prepped with alcohol prior to injection  Patient tolerated well, no complications were noted Band aid applied over injection site.   Performed by: Akasia Ahmad LPN  Follow up: 1 month for injection. Appointment scheduled with patient.

## 2023-12-22 DIAGNOSIS — M87051 Idiopathic aseptic necrosis of right femur: Secondary | ICD-10-CM | POA: Diagnosis not present

## 2023-12-25 ENCOUNTER — Ambulatory Visit (HOSPITAL_COMMUNITY)
Admission: RE | Admit: 2023-12-25 | Discharge: 2023-12-25 | Disposition: A | Discharge status: Home / Self Care | Payer: HMO | Source: Ambulatory Visit | Attending: Hematology | Admitting: Hematology

## 2023-12-25 DIAGNOSIS — M5031 Other cervical disc degeneration,  high cervical region: Secondary | ICD-10-CM | POA: Diagnosis not present

## 2023-12-25 DIAGNOSIS — K118 Other diseases of salivary glands: Secondary | ICD-10-CM | POA: Diagnosis not present

## 2023-12-25 DIAGNOSIS — I6523 Occlusion and stenosis of bilateral carotid arteries: Secondary | ICD-10-CM | POA: Diagnosis not present

## 2023-12-25 DIAGNOSIS — R221 Localized swelling, mass and lump, neck: Secondary | ICD-10-CM | POA: Diagnosis not present

## 2023-12-25 MED ORDER — IOHEXOL 300 MG/ML  SOLN
75.0000 mL | Freq: Once | INTRAMUSCULAR | Status: AC | PRN
  Administered 2023-12-25: 75 mL via INTRAVENOUS

## 2023-12-26 ENCOUNTER — Other Ambulatory Visit: Payer: Self-pay

## 2023-12-29 ENCOUNTER — Other Ambulatory Visit: Payer: Self-pay

## 2023-12-29 NOTE — Progress Notes (Signed)
 Dan Overlie, MD  Dan [name] PROCEDURE / BIOPSY REVIEW Date: 12/29/23  Requested Biopsy site: Palpable right neck lesion Reason for request: Needs diagnosis Imaging review: Best seen on CT neck 12/25/23.  Right parotid nodule  Decision: Approved Imaging modality to perform: Ultrasound Schedule with: Sedation per patient Schedule for: Any VIR  Additional comments: US guided FNA and/or core biopsy of right parotid nodule (presuming this is the area of concern)  Please contact me with questions, concerns, or if issue pertaining to this request arise.  Arn Medal, MD [facility] Radiology       Previous Messages    ----- Message ----- From: Dan [name] Sent: 12/29/2023  11:13 AM EST To: Dan [name]; Ir Procedure Requests Subject: CT Biopsy                                      Procedure : Ct Biopsy  Reason ; cervical lymphadenopathy - US report from [facility] Dx: Neck mass [R22.1 (ICD-10-CM)]    History : US Soft tissue neck , CT soft tissue neck w/  - done on 12/25/23  Provider ; Doreatha Massed, MD  Provider contact : [phone]

## 2023-12-31 ENCOUNTER — Ambulatory Visit: Payer: HMO | Admitting: Orthopaedic Surgery

## 2023-12-31 DIAGNOSIS — M1611 Unilateral primary osteoarthritis, right hip: Secondary | ICD-10-CM | POA: Diagnosis not present

## 2023-12-31 NOTE — Progress Notes (Signed)
 [facility] [address], [place] [address]    Clinic Day:  01/01/2024  Referring physician: Richardean Chimera, MD  Patient Care Team: Dan Chimera, MD as PCP - General (Family Medicine) Dan Mood Dorothe Pea, MD as PCP - Cardiology (Cardiology) Dan Gauss Gerrit Friends, MD as Consulting Physician (Gastroenterology) Dan Massed, MD as Medical Oncologist (Medical Oncology) Dan Sarah, RN as Oncology Nurse Navigator (Medical Oncology)   ASSESSMENT & PLAN:   Assessment: 1.  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  PSA responded well reaching a nadir of 0.1. - PSA rose again to 3.4 in 06/2022, PSMA PET scan: Oligometastatic disease involving single internal iliac lymph node. - 07/29/2022 - 08/09/2022: 10 fx course of UHRT to right internal iliac node, PSA only decreased slightly to 3.1 in 10/2022, further went up to 5 on 12/05/2022 - PSMA PET (12/19/2022): New radiotracer avid 2 mm infrarenal retrocaval lymph node and a minimal increase size of the previously treated right common iliac lymph node measuring 3 mm compared to 2 mm previously, no evidence of recurrence in the prostatectomy bed or bone mets. - Loading dose of Firmagon with Dan [name] on 01/09/2023 - Abiraterone and prednisone started on 01/18/2023. - XRT to the retrocaval lymph node and right common iliac lymph node completed on 01/29/2023, 10 fractions   2.  Social/family history: - He lives with wife at home.  He is independent of all ADLs and IADLs.  Uses cane for longer distances.  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  Current active smoker, 1 pack/day for 45 years. - Father had lung cancer and was smoker.    Plan: 1.  Metastatic CSPC to abdominal/high pelvic lymph node (low-volume): - He is tolerating abiraterone and prednisone very well. - Last PSA is 0.01 on 12/03/2023. - Continue monthly Firmagon with Dan [name]. - Continue abiraterone 500 mg daily and prednisone 5 mg daily.  RTC 2 months for follow-up with repeat PSA.   2.  Iron deficiency state: - He received INFeD 1 g on 12/02/2023 for severe fatigue.   3.  Mild hypercalcemia: - Mild hypercalcemia is stable at 10.8.  Not on calcium supplements.   4.  Osteoporosis (DEXA on 03/17/2023 T score -4): - Received 1 dose of Prolia on 04/03/2023 and decided not to continue it.  5.  Right neck mass: - He reported noticing right neck swelling for the past 1 to 48-months. 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  No cervical adenopathy. - Will make referral to ENT Dr. Jenne [name].  6.  JAK2 V617F/BCR-ABL negative leukocytosis and thrombocytosis: - We reviewed results of JAK2 V617F and BCR/ABL which were negative.  Likely reactive thrombocytosis and leukocytosis from smoking and prednisone.    Orders Placed This Encounter  Procedures   CBC with Differential    Standing Status:   Future    Expected Date:   03/04/2024    Expiration Date:   12/31/2024   Comprehensive metabolic panel    Standing  Status:   Future    Expected Date:   03/04/2024    Expiration Date:   12/31/2024   Ferritin    Standing Status:   Future    Expected Date:   03/04/2024    Expiration Date:   12/31/2024   Iron and TIBC ([facility] ONLY)    Standing Status:   Future    Expected Date:   03/04/2024    Expiration Date:   12/31/2024   PSA    Standing Status:   Future    Expected Date:   03/04/2024    Expiration Date:   12/31/2024       Dan [name],acting as a scribe for Dan Massed, MD.,have documented all relevant documentation on the behalf of Dan Massed, MD,as directed by  Dan Massed, MD while in the presence of Dan Massed, MD.  I, Dan Massed MD, have reviewed the above documentation for accuracy and completeness, and I agree with the above.     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 RALP 09/22/18 (Dan [name]) 2. Salvage XRT + ADT 07/05/2019 - 08/31/2019 (Dan [name]) 3. UHRT to right internal iliac node 07/29/22 - 08/09/22 4. UHRT to infrarenal retrocaval lymph node 01/16/23 - 01/29/23  Current Therapy:  Deborra Medina and Abiraterone    HISTORY OF PRESENT ILLNESS:   Oncology History   No history exists.     INTERVAL HISTORY:   Dan [name] is a 78 y.o. 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 He was last seen by me on 12/08/23.  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 No cervical lymphadenopathy.  Today, he states that he is doing well overall. His appetite level is at 100%. His energy level is at 0%. He is accompanied by his wife. His wife notes he is having a hip replacement in 1-2 months. Dan [name] is still receiving firmagon injections with Dan [name].  PAST MEDICAL HISTORY:   Past Medical History: Past Medical History:  Diagnosis Date   BPH (benign prostatic hyperplasia)    COPD (chronic  obstructive pulmonary disease) (HCC)    Depression    ED (erectile dysfunction)    Gout    Hyperlipidemia    Hypertension    Loss of hearing    Nocturia    Paroxysmal atrial fibrillation (HCC)    Prostate cancer (HCC)     Surgical History: Past Surgical History:  Procedure Laterality Date   CARDIOVASCULAR STRESS TEST  12/31/2004   normal perfuction nuclear study w/ no evidence ischemia /  normal LV function and wall motion , ef 52%   PROSTATECTOMY  2019   TOTAL HIP ARTHROPLASTY      Social History: Social History   Socioeconomic History   Marital status: Married    Spouse name: Not on file   Number of children: Not on file   Years of education: Not on file   Highest education level: Not on file  Occupational History  Not on file  Tobacco Use   Smoking status: Every Day    Types: Cigarettes    Start date: 06/12/1967    Passive exposure: Never   Smokeless tobacco: Never  Vaping Use   Vaping status: Never Used  Substance and Sexual Activity   Alcohol use: Not Currently   Drug use: Not Currently   Sexual activity: Not on file  Other Topics Concern   Not on file  Social History Narrative   Not on file   Social Drivers of Health   Financial Resource Strain: Not on file  Food Insecurity: No Food Insecurity (01/14/2023)   Hunger Vital Sign    Worried About Running Out of Food in the Last Year: Never true    Ran Out of Food in the Last Year: Never true  Transportation Needs: No Transportation Needs (01/14/2023)   PRAPARE - Administrator, Civil Service (Medical): No    Lack of Transportation (Non-Medical): No  Physical Activity: Not on file  Stress: Not on file  Social Connections: Unknown (03/18/2022)   [name]   Social Network    Social Network: Not on file  Intimate Partner Violence: Not At Risk (01/14/2023)   Humiliation, Afraid, Rape, and Kick questionnaire    Fear of Current or Ex-Partner: No    Emotionally  Abused: No    Physically Abused: No    Sexually Abused: No    Family History: Family History  Problem Relation Age of Onset   Hypertension Mother    Cancer Father        lung   Colon cancer Neg Hx    Inflammatory bowel disease Neg Hx    Pancreatic cancer Neg Hx     Current Medications:  Current Outpatient Medications:    abiraterone acetate (ZYTIGA) 250 MG tablet, Take 4 tablets (1,000 mg total) by mouth daily. Take on an empty stomach 1 hour before or 2 hours after a meal, Disp: 120 tablet, Rfl: 3   albuterol (PROVENTIL HFA;VENTOLIN HFA) 108 (90 Base) MCG/ACT inhaler, Inhale 2 puffs into the lungs every 6 (six) hours as needed for wheezing or shortness of breath., Disp: , Rfl:    albuterol (PROVENTIL) (2.5 MG/3ML) 0.083% nebulizer solution, Take 2.5 mg by nebulization every 6 (six) hours as needed., Disp: , Rfl:    allopurinol (ZYLOPRIM) 100 MG tablet, Take 100 mg by mouth daily., Disp: , Rfl:    Allopurinol 200 MG TABS, , Disp: , Rfl:    Ascorbic Acid (VITAMIN C) 1000 MG tablet, Take 1,000 mg by mouth daily., Disp: , Rfl:    aspirin EC 81 MG tablet, Take 81 mg by mouth daily. Swallow whole., Disp: , Rfl:    cholecalciferol (VITAMIN D3) 25 MCG (1000 UNIT) tablet, Take 1,000 Units by mouth daily., Disp: , Rfl:    colchicine 0.6 MG tablet, Take 1 tablet by mouth daily as needed., Disp: , Rfl:    cyclobenzaprine (FLEXERIL) 10 MG tablet, Take 1 tablet (10 mg total) by mouth at bedtime. One tablet every night at bedtime as needed for spasm., Disp: 30 tablet, Rfl: 0   diltiazem (CARDIZEM CD) 360 MG 24 hr capsule, Take by mouth daily., Disp: , Rfl:    HYDROcodone-acetaminophen (NORCO) 10-325 MG tablet, Take 1 tablet by mouth 4 (four) times daily as needed., Disp: , Rfl:    HYDROcodone-acetaminophen (NORCO) 10-325 MG tablet, Take 1 tablet by mouth every 6 (six) hours as needed., Disp: 30 tablet, Rfl:  0   losartan (COZAAR) 25 MG tablet, TAKE 1 TABLET (25 MG TOTAL) BY MOUTH DAILY., Disp: 90  tablet, Rfl: 2   magnesium oxide (MAG-OX) 400 (240 Mg) MG tablet, TAKE 1 TABLET BY MOUTH EVERY DAY, Disp: 90 tablet, Rfl: 1   megestrol (MEGACE) 40 MG/ML suspension, Take by mouth., Disp: , Rfl:    mirtazapine (REMERON) 30 MG tablet, Take 30 mg by mouth daily., Disp: , Rfl:    oxyCODONE-acetaminophen (PERCOCET/ROXICET) 5-325 MG tablet, One tablet every four hours as need for pain,  5 day limit., Disp: 25 tablet, Rfl: 0   potassium chloride (MICRO-K) 10 MEQ CR capsule, TAKE 2 CAPSULES BY MOUTH DAILY., Disp: 7 capsule, Rfl: 0   predniSONE (DELTASONE) 5 MG tablet, Take 1 tablet (5 mg total) by mouth daily with breakfast., Disp: 90 tablet, Rfl: 1   predniSONE (STERAPRED UNI-PAK 21 TAB) 5 MG (21) TBPK tablet, Take 6 pills first day; 5 pills second day; 4 pills third day; 3 pills fourth day; 2 pills next day and 1 pill last day., Disp: 21 tablet, Rfl: 0   TRELEGY ELLIPTA 200-62.5-25 MCG/ACT AEPB, Inhale 1 puff into the lungs daily., Disp: , Rfl:    Zinc 50 MG TABS, Take 1 tablet by mouth daily., Disp: , Rfl:   Current Facility-Administered Medications:    degarelix (FIRMAGON) injection 80 mg, 80 mg, Subcutaneous, Q28 days, Bjorn Pippin, MD, 80 mg at 12/18/23 1013   Allergies: Allergies  Allergen Reactions   Tamsulosin Hives and Itching    REVIEW OF SYSTEMS:   Review of Systems  Constitutional:  Negative for chills, fatigue and fever.  HENT:   Negative for lump/mass, mouth sores, nosebleeds, sore throat and trouble swallowing.   Eyes:  Negative for eye problems.  Respiratory:  Positive for shortness of breath. Negative for cough.   Cardiovascular:  Negative for chest pain, leg swelling and palpitations.  Gastrointestinal:  Negative for abdominal pain, constipation, diarrhea, nausea and vomiting.  Genitourinary:  Negative for bladder incontinence, difficulty urinating, dysuria, frequency, hematuria and nocturia.   Musculoskeletal:  Negative for arthralgias, back pain, flank pain, myalgias and  neck pain.       +pain in bilateral hips and legs, 7/10 severity  Skin:  Negative for itching and rash.  Neurological:  Positive for numbness (in left hand). Negative for dizziness and headaches.  Hematological:  Does not bruise/bleed easily.  Psychiatric/Behavioral:  Positive for sleep disturbance. Negative for depression and suicidal ideas. The patient is not nervous/anxious.   All other systems reviewed and are negative.    VITALS:   Blood pressure 129/67, pulse 78, temperature (!) 96.7 F (35.9 C), temperature source Tympanic, resp. rate 20, weight 151 lb 7.3 oz (68.7 kg), SpO2 93%.  Wt Readings from Last 3 Encounters:  01/01/24 151 lb 7.3 oz (68.7 kg)  12/08/23 150 lb 2.1 oz (68.1 kg)  12/03/23 152 lb (68.9 kg)    Body mass index is 21.73 kg/m.  Performance status (ECOG): 1 - Symptomatic but completely ambulatory  PHYSICAL EXAM:   Physical Exam Vitals and nursing note reviewed. Exam conducted with a chaperone present.  Constitutional:      Appearance: Normal appearance.  Cardiovascular:     Rate and Rhythm: Normal rate and regular rhythm.     Pulses: Normal pulses.     Heart sounds: Normal heart sounds.  Pulmonary:     Effort: Pulmonary effort is normal.     Breath sounds: Normal breath sounds.  Abdominal:  Palpations: Abdomen is soft. There is no hepatomegaly, splenomegaly or mass.     Tenderness: There is no abdominal tenderness.  Musculoskeletal:     Right lower leg: No edema.     Left lower leg: No edema.  Lymphadenopathy:     Cervical: No cervical adenopathy.     Right cervical: No superficial, deep or posterior cervical adenopathy.    Left cervical: No superficial, deep or posterior cervical adenopathy.     Upper Body:     Right upper body: No supraclavicular or axillary adenopathy.     Left upper body: No supraclavicular or axillary adenopathy.  Neurological:     General: No focal deficit present.     Mental Status: He is alert and oriented to person,  place, and time.  Psychiatric:        Mood and Affect: Mood normal.        Behavior: Behavior normal.    LABS:      Latest Ref Rng & Units 12/03/2023   10:02 AM 10/13/2023    9:54 AM 07/09/2023   10:56 AM  CBC  WBC 4.0 - 10.5 K/uL 24.1  17.4  14.1   Hemoglobin 13.0 - 17.0 g/dL 16.1  09.6  04.5   Hematocrit 39.0 - 52.0 % 35.1  39.2  36.7   Platelets 150 - 400 K/uL 493  438  453       Latest Ref Rng & Units 12/03/2023   10:02 AM 10/13/2023    9:54 AM 08/14/2023   10:38 AM  CMP  Glucose 70 - 99 mg/dL 409  811  914   BUN 8 - 23 mg/dL 21  21  23    Creatinine 0.61 - 1.24 mg/dL 7.82  9.56  2.13   Sodium 135 - 145 mmol/L 134  137  137   Potassium 3.5 - 5.1 mmol/L 3.7  4.5  4.6   Chloride 98 - 111 mmol/L 102  104  98   CO2 22 - 32 mmol/L 20  23  18    Calcium 8.9 - 10.3 mg/dL 08.6  57.8  46.9   Total Protein 6.5 - 8.1 g/dL 7.5  7.0    Total Bilirubin 0.0 - 1.2 mg/dL 0.9  0.3    Alkaline Phos 38 - 126 U/L 59  56    AST 15 - 41 U/L 19  14    ALT 0 - 44 U/L 12  13       No results found for: "CEA1", "CEA" / No results found for: "CEA1", "CEA" Lab Results  Component Value Date   PSA1 <0.1 08/14/2023   No results found for: "GEX528" No results found for: "CAN125"  No results found for: "TOTALPROTELP", "ALBUMINELP", "A1GS", "A2GS", "BETS", "BETA2SER", "GAMS", "MSPIKE", "SPEI" Lab Results  Component Value Date   TIBC 340 10/13/2023   TIBC 343 07/09/2023   FERRITIN 44 10/13/2023   FERRITIN 51 07/09/2023   IRONPCTSAT 18 10/13/2023   IRONPCTSAT 20 07/09/2023   Lab Results  Component Value Date   LDH 99 12/03/2023     STUDIES:   CT SOFT TISSUE NECK W CONTRAST Result Date: 12/31/2023 CLINICAL DATA:  Neck mass in the right parotid region. History of prostate cancer. EXAM: CT NECK WITH CONTRAST TECHNIQUE: Multidetector CT imaging of the neck was performed using the standard protocol following the bolus administration of intravenous contrast. RADIATION DOSE REDUCTION: This exam  was performed according to the departmental dose-optimization program which includes automated exposure control, adjustment of the  mA and/or kV according to patient size and/or use of iterative reconstruction technique. CONTRAST:  75mL OMNIPAQUE IOHEXOL 300 MG/ML  SOLN COMPARISON:  PET-CT 06/18/2022 FINDINGS: Pharynx and larynx: No evidence of mass or swelling. Widely patent airway. No fluid collection or inflammatory changes in the parapharyngeal or retropharyngeal spaces. Salivary glands: Largely homogeneous, solid appearing mass posteriorly in the right parotid gland measuring 1.3 x 1.7 x 3.0 cm, mildly enlarged from 2023. Unremarkable appearance of the left parotid and both submandibular glands. Thyroid: Unremarkable. Lymph nodes: No enlarged or suspicious lymph nodes in the neck. Vascular: Prominent atherosclerotic calcification of the aortic arch and proximal arch vessels. Moderate atherosclerosis at the carotid bifurcations. Limited intracranial: Unremarkable. Visualized orbits: Right cataract extraction. Mastoids and visualized paranasal sinuses: Clear. Skeleton: No suspicious osseous lesion. Moderate to severe disc degeneration from C3-4 through C6-7. Upper chest: Emphysema. Other: None. IMPRESSION: 1. 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 Consider ENT referral. 2. No cervical lymphadenopathy. Electronically Signed   By: Sebastian Ache M.D.   On: 12/31/2023 08:51

## 2024-01-01 ENCOUNTER — Inpatient Hospital Stay (HOSPITAL_BASED_OUTPATIENT_CLINIC_OR_DEPARTMENT_OTHER): Payer: HMO | Admitting: Hematology

## 2024-01-01 VITALS — BP 129/67 | HR 78 | Temp 96.7°F | Resp 20 | Wt 151.5 lb

## 2024-01-01 DIAGNOSIS — C61 Malignant neoplasm of prostate: Secondary | ICD-10-CM | POA: Diagnosis not present

## 2024-01-01 DIAGNOSIS — R221 Localized swelling, mass and lump, neck: Secondary | ICD-10-CM

## 2024-01-01 DIAGNOSIS — C775 Secondary and unspecified malignant neoplasm of intrapelvic lymph nodes: Secondary | ICD-10-CM | POA: Diagnosis not present

## 2024-01-01 DIAGNOSIS — D508 Other iron deficiency anemias: Secondary | ICD-10-CM | POA: Diagnosis not present

## 2024-01-01 NOTE — Patient Instructions (Signed)
 [facility] at [facility] Discharge Instructions   You were seen and examined today by Dr. Ellin Saba.  He reviewed the results of your lab work which are normal/stable.   He reviewed the results of your CT scan. It is showing a parotid tumor. It is likely benign. We will make a referral to and ENT doctor in [place] to evaluate this.   Continue Zytiga as prescribed.   We will see you back in 2 months. We will repeat lab work prior to this visit.    Return as scheduled.    Thank you for choosing [facility] at [facility] to provide your oncology and hematology care.  To afford each patient quality time with our provider, please arrive at least 15 minutes before your scheduled appointment time.   If you have a lab appointment with the Cancer Center please come in thru the Main Entrance and check in at the main information desk.  You need to re-schedule your appointment should you arrive 10 or more minutes late.  We strive to give you quality time with our providers, and arriving late affects you and other patients whose appointments are after yours.  Also, if you no show three or more times for appointments you may be dismissed from the clinic at the providers discretion.     Again, thank you for choosing [facility].  Our hope is that these requests will decrease the amount of time that you wait before being seen by our physicians.       _____________________________________________________________  Should you have questions after your visit to [facility], please contact our office at [phone] and follow the prompts.  Our office hours are 8:00 a.m. and 4:30 p.m. Monday - Friday.  Please note that voicemails left after 4:00 p.m. may not be returned until the following business day.  We are closed weekends and major holidays.  You do have access to a nurse 24-7, just call the main number to the clinic [phone]  and do not press any options, hold on the line and a nurse will answer the phone.    For prescription refill requests, have your pharmacy contact our office and allow 72 hours.    Due to Covid, you will need to wear a mask upon entering the hospital. If you do not have a mask, a mask will be given to you at the Main Entrance upon arrival. For doctor visits, patients may have 1 support person age 26 or older with them. For treatment visits, patients can not have anyone with them due to social distancing guidelines and our immunocompromised population.

## 2024-01-01 NOTE — Progress Notes (Signed)
 Patient is taking Zytiga as prescribed. He has not missed any doses and reports no side effects at this time.

## 2024-01-05 ENCOUNTER — Other Ambulatory Visit (HOSPITAL_COMMUNITY): Payer: Self-pay

## 2024-01-05 ENCOUNTER — Other Ambulatory Visit: Payer: Self-pay

## 2024-01-06 NOTE — Progress Notes (Signed)
 Sent message, via epic in basket, requesting orders in epic from Careers adviser.

## 2024-01-09 NOTE — Progress Notes (Addendum)
 PCP - Almond Lint, MD [facility] Cardiologist - LOV 07-10-23 Sharlene Dory, NP  Sees Dr. Wyline Mood as needed no recent problems Pulm- LOV 01-13-24 Dr. Sherene Sires clearance epic  PPM/ICD -  Device Orders -  Rep Notified -   Chest x-ray - 07-14-23 epic EKG - 02-17-23 epic Stress Test - 2021 epic ECHO - 07-16-23 epic Cardiac Cath -    Sleep Study -  CPAP -   Fasting Blood Sugar -  Checks Blood Sugar _____ times a day  Blood Thinner Instructions: Aspirin Instructions:81 mg stop 7 days  ERAS Protcol - PRE-SURGERY Ensure     COVID vaccine -no  Activity--Can complete ADL's without CP or SOB   Anesthesia review: HTN,COPD, PAF no issues since quit drinking alcohol, per pt. Family WBC are chronically high due to chemo drug and steroids for prostate cancer  Patient denies shortness of breath, fever, cough and chest pain at PAT appointment   All instructions explained to the patient, with a verbal understanding of the material. Patient agrees to go over the instructions while at home for a better understanding. Patient also instructed to self quarantine after being tested for COVID-19. The opportunity to ask questions was provided.

## 2024-01-09 NOTE — Patient Instructions (Addendum)
 SURGICAL WAITING ROOM VISITATION  Patients having surgery or a procedure may have no more than 2 support people in the waiting area - these visitors may rotate.    Children under the age of 12 must have an adult with them who is not the patient.  Due to an increase in RSV and influenza rates and associated hospitalizations, children ages 27 and under may not visit patients in [facility] hospitals.  Visitors with respiratory illnesses are discouraged from visiting and should remain at home.  If the patient needs to stay at the hospital during part of their recovery, the visitor guidelines for inpatient rooms apply. Pre-op nurse will coordinate an appropriate time for 1 support person to accompany patient in pre-op.  This support person may not rotate.    Please refer to the [facility] website for the visitor guidelines for Inpatients (after your surgery is over and you are in a regular room).       Your procedure is scheduled on: 01-20-24   Report to [facility] Main Entrance    Report to admitting at      10:00 AM   Call this number if you have problems the morning of surgery [phone]   Do not eat food :After Midnight.   After Midnight you may have the following liquids until __0930____ AM/ DAY OF SURGERY  then nothing by mouth  Water Non-Citrus Juices (without pulp, NO RED-Apple, White grape, White cranberry) Black Coffee (NO MILK/CREAM OR CREAMERS, sugar ok)  Clear Tea (NO MILK/CREAM OR CREAMERS, sugar ok) regular and decaf                             Plain Jell-O (NO RED)                                           Fruit ices (not with fruit pulp, NO RED)                                     Popsicles (NO RED)                                                               Sports drinks like Gatorade (NO RED)                    The day of surgery:  Drink ONE (1) Pre-Surgery Clear Ensure  BY 0930  AM the morning of surgery. Drink in one sitting. Do not sip.  This  drink was given to you during your hospital  pre-op appointment visit. Nothing else to drink after completing the  Pre-Surgery Clear Ensure .          If you have questions, please contact your surgeon's office.   FOLLOW  ANY ADDITIONAL PRE OP INSTRUCTIONS YOU RECEIVED FROM YOUR SURGEON'S OFFICE!!!     Oral Hygiene is also important to reduce your risk of infection.  Remember - BRUSH YOUR TEETH THE MORNING OF SURGERY WITH YOUR REGULAR TOOTHPASTE  DENTURES WILL BE REMOVED PRIOR TO SURGERY PLEASE DO NOT APPLY "Poly grip" OR ADHESIVES!!!   Do NOT smoke after Midnight   Stop all vitamins and herbal supplements 7 days before surgery.   Take these medicines the morning of surgery with A SIP OF WATER: inhaler, hydrocodone if needed, diltiazem, allopurinol, Zytiga    Bring CPAP mask and tubing day of surgery.                              You may not have any metal on your body including , jewelry, and body piercing             Do not wear lotions, powders, /cologne, or deodorant                 Men may shave face and neck.   Do not bring valuables to the hospital. [facility] IS NOT             RESPONSIBLE   FOR VALUABLES.   Contacts, glasses, dentures or bridgework may not be worn into surgery.   Bring small overnight bag day of surgery.   DO NOT BRING YOUR HOME MEDICATIONS TO THE HOSPITAL. PHARMACY WILL DISPENSE MEDICATIONS LISTED ON YOUR MEDICATION LIST TO YOU DURING YOUR ADMISSION IN THE HOSPITAL!    Patients discharged on the day of surgery will not be allowed to drive home.  Someone NEEDS to stay with you for the first 24 hours after anesthesia.   Special Instructions: Bring a copy of your healthcare power of attorney and living will documents the day of surgery if you haven't scanned them before.              Please read over the following fact sheets you were given: IF YOU HAVE QUESTIONS ABOUT YOUR PRE-OP INSTRUCTIONS PLEASE CALL  [phone]    If you test positive for Covid or have been in contact with anyone that has tested positive in the last 10 days please notify you surgeon.      Pre-operative 5 CHG Bath Instructions   You can play a key role in reducing the risk of infection after surgery. Your skin needs to be as free of germs as possible. You can reduce the number of germs on your skin by washing with CHG (chlorhexidine gluconate) soap before surgery. CHG is an antiseptic soap that kills germs and continues to kill germs even after washing.   DO NOT use if you have an allergy to chlorhexidine/CHG or antibacterial soaps. If your skin becomes reddened or irritated, stop using the CHG and notify one of our RNs at [phone].   Please shower with the CHG soap starting 4 days before surgery using the following schedule:     Please keep in mind the following:  DO NOT shave, including legs and underarms, starting the day of your first shower.   You may shave your face at any point before/day of surgery.  Place clean sheets on your bed the day you start using CHG soap. Use a clean washcloth (not used since being washed) for each shower. DO NOT sleep with pets once you start using the CHG.   CHG Shower Instructions:  If you choose to wash your hair and private area, wash first with your normal shampoo/soap.  After you use shampoo/soap, rinse your hair and body thoroughly to  remove shampoo/soap residue.  Turn the water OFF and apply about 3 tablespoons (45 ml) of CHG soap to a CLEAN washcloth.  Apply CHG soap ONLY FROM YOUR NECK DOWN TO YOUR TOES (washing for 3-5 minutes)  DO NOT use CHG soap on face, private areas, open wounds, or sores.  Pay special attention to the area where your surgery is being performed.  If you are having back surgery, having someone wash your back for you may be helpful. Wait 2 minutes after CHG soap is applied, then you may rinse off the CHG soap.  Pat dry with a clean towel  Put  on clean clothes/pajamas   If you choose to wear lotion, please use ONLY the CHG-compatible lotions on the back of this paper.     Additional instructions for the day of surgery: DO NOT APPLY any lotions, deodorants, cologne, or perfumes.   Put on clean/comfortable clothes.  Brush your teeth.  Ask your nurse before applying any prescription medications to the skin.      CHG Compatible Lotions   Aveeno Moisturizing lotion  Cetaphil Moisturizing Cream  Cetaphil Moisturizing Lotion  Clairol Herbal Essence Moisturizing Lotion, Dry Skin  Clairol Herbal Essence Moisturizing Lotion, Extra Dry Skin  Clairol Herbal Essence Moisturizing Lotion, Normal Skin  Curel Age Defying Therapeutic Moisturizing Lotion with Alpha Hydroxy  Curel Extreme Care Body Lotion  Curel Soothing Hands Moisturizing Hand Lotion  Curel Therapeutic Moisturizing Cream, Fragrance-Free  Curel Therapeutic Moisturizing Lotion, Fragrance-Free  Curel Therapeutic Moisturizing Lotion, Original Formula  Eucerin Daily Replenishing Lotion  Eucerin Dry Skin Therapy Plus Alpha Hydroxy Crme  Eucerin Dry Skin Therapy Plus Alpha Hydroxy Lotion  Eucerin Original Crme  Eucerin Original Lotion  Eucerin Plus Crme Eucerin Plus Lotion  Eucerin TriLipid Replenishing Lotion  Keri Anti-Bacterial Hand Lotion  Keri Deep Conditioning Original Lotion Dry Skin Formula Softly Scented  Keri Deep Conditioning Original Lotion, Fragrance Free Sensitive Skin Formula  Keri Lotion Fast Absorbing Fragrance Free Sensitive Skin Formula  Keri Lotion Fast Absorbing Softly Scented Dry Skin Formula  Keri Original Lotion  Keri Skin Renewal Lotion Keri Silky Smooth Lotion  Keri Silky Smooth Sensitive Skin Lotion  Nivea Body Creamy Conditioning Oil  Nivea Body Extra Enriched Teacher, adult education Moisturizing Lotion Nivea Crme  Nivea Skin Firming Lotion  NutraDerm 30 Skin Lotion  NutraDerm Skin Lotion  NutraDerm  Therapeutic Skin Cream  NutraDerm Therapeutic Skin Lotion  ProShield Protective Hand Cream  Provon moisturizing lotion  WHAT IS A BLOOD TRANSFUSION? Blood Transfusion Information  A transfusion is the replacement of blood or some of its parts. Blood is made up of multiple cells which provide different functions. Red blood cells carry oxygen and are used for blood loss replacement. White blood cells fight against infection. Platelets control bleeding. Plasma helps clot blood. Other blood products are available for specialized needs, such as hemophilia or other clotting disorders. BEFORE THE TRANSFUSION  Who gives blood for transfusions?  Healthy volunteers who are fully evaluated to make sure their blood is safe. This is blood bank blood. Transfusion therapy is the safest it has ever been in the practice of medicine. Before blood is taken from a donor, a complete history is taken to make sure that person has no history of diseases nor engages in risky social behavior (examples are intravenous drug use or sexual activity with multiple partners). The donor's travel history is screened to minimize risk of transmitting infections, such as malaria. The  donated blood is tested for signs of infectious diseases, such as HIV and hepatitis. The blood is then tested to be sure it is compatible with you in order to minimize the chance of a transfusion reaction. If you or a relative donates blood, this is often done in anticipation of surgery and is not appropriate for emergency situations. It takes many days to process the donated blood. RISKS AND COMPLICATIONS Although transfusion therapy is very safe and saves many lives, the main dangers of transfusion include:  Getting an infectious disease. Developing a transfusion reaction. This is an allergic reaction to something in the blood you were given. Every precaution is taken to prevent this. The decision to have a blood transfusion has been considered  carefully by your caregiver before blood is given. Blood is not given unless the benefits outweigh the risks. AFTER THE TRANSFUSION Right after receiving a blood transfusion, you will usually feel much better and more energetic. This is especially true if your red blood cells have gotten low (anemic). The transfusion raises the level of the red blood cells which carry oxygen, and this usually causes an energy increase. The nurse administering the transfusion will monitor you carefully for complications. HOME CARE INSTRUCTIONS  No special instructions are needed after a transfusion. You may find your energy is better. Speak with your caregiver about any limitations on activity for underlying diseases you may have. SEEK MEDICAL CARE IF:  Your condition is not improving after your transfusion. You develop redness or irritation at the intravenous (IV) site. SEEK IMMEDIATE MEDICAL CARE IF:  Any of the following symptoms occur over the next 12 hours: Shaking chills. You have a temperature by mouth above 102 F (38.9 C), not controlled by medicine. Chest, back, or muscle pain. People around you feel you are not acting correctly or are confused. Shortness of breath or difficulty breathing. Dizziness and fainting. You get a rash or develop hives. You have a decrease in urine output. Your urine turns a dark color or changes to pink, red, or brown. Any of the following symptoms occur over the next 10 days: You have a temperature by mouth above 102 F (38.9 C), not controlled by medicine. Shortness of breath. Weakness after normal activity. The white part of the eye turns yellow (jaundice). You have a decrease in the amount of urine or are urinating less often. Your urine turns a dark color or changes to pink, red, or brown. Document Released: 10/18/2000 Document Revised: 01/13/2012 Document Reviewed: 06/06/2008 Exit[employer] Patient Information 2014 [place],  [place].  _______________________________________________________________________  Incentive Spirometer  An incentive spirometer is a tool that can help keep your lungs clear and active. This tool measures how well you are filling your lungs with each breath. Taking long deep breaths may help reverse or decrease the chance of developing breathing (pulmonary) problems (especially infection) following: A long period of time when you are unable to move or be active. BEFORE THE PROCEDURE  If the spirometer includes an indicator to show your best effort, your nurse or respiratory therapist will set it to a desired goal. If possible, sit up straight or lean slightly forward. Try not to slouch. Hold the incentive spirometer in an upright position. INSTRUCTIONS FOR USE  Sit on the edge of your bed if possible, or sit up as far as you can in bed or on a chair. Hold the incentive spirometer in an upright position. Breathe out normally. Place the mouthpiece in your mouth and seal your lips tightly  around it. Breathe in slowly and as deeply as possible, raising the piston or the ball toward the top of the column. Hold your breath for 3-5 seconds or for as long as possible. Allow the piston or ball to fall to the bottom of the column. Remove the mouthpiece from your mouth and breathe out normally. Rest for a few seconds and repeat Steps 1 through 7 at least 10 times every 1-2 hours when you are awake. Take your time and take a few normal breaths between deep breaths. The spirometer may include an indicator to show your best effort. Use the indicator as a goal to work toward during each repetition. After each set of 10 deep breaths, practice coughing to be sure your lungs are clear. If you have an incision (the cut made at the time of surgery), support your incision when coughing by placing a pillow or rolled up towels firmly against it. Once you are able to get out of bed, walk around indoors and cough well.  You may stop using the incentive spirometer when instructed by your caregiver.  RISKS AND COMPLICATIONS Take your time so you do not get dizzy or light-headed. If you are in pain, you may need to take or ask for pain medication before doing incentive spirometry. It is harder to take a deep breath if you are having pain. AFTER USE Rest and breathe slowly and easily. It can be helpful to keep track of a log of your progress. Your caregiver can provide you with a simple table to help with this. If you are using the spirometer at home, follow these instructions: SEEK MEDICAL CARE IF:  You are having difficultly using the spirometer. You have trouble using the spirometer as often as instructed. Your pain medication is not giving enough relief while using the spirometer. You develop fever of 100.5 F (38.1 C) or higher. SEEK IMMEDIATE MEDICAL CARE IF:  You cough up bloody sputum that had not been present before. You develop fever of 102 F (38.9 C) or greater. You develop worsening pain at or near the incision site. MAKE SURE YOU:  Understand these instructions. Will watch your condition. Will get help right away if you are not doing well or get worse. Document Released: 03/03/2007 Document Revised: 01/13/2012 Document Reviewed: 05/04/2007 [facility] Patient Information 2014 [place], [place].   ________________________________________________________________________

## 2024-01-12 NOTE — Progress Notes (Unsigned)
 Dan [name], male    DOB: 1946/02/24    MRN: [id number]   Brief patient profile:  46  yowm active smoker  referred to pulmonary clinic in [place]  01/13/2024 by Dr Ellin Saba  for doe  x  2023 while on chemo po dytiga for prostate ca.   Cardiac eval by [facility] cards neg but copd on cxr 9/9/924      History of Present Illness  01/13/2024  Pulmonary/ 1st office eval/ Emilie Carp / [facility] trelegy x 3-4 y Chief Complaint  Patient presents with   Consult    Copd   Dyspnea:  also limited by R hip / 2 wheeled walker for hip surgery  Cough: still some rattle / worse in am> mucoid Sleep: flat bed/ one pillow  SABA use: occasionally hfa/ never neb  02:  prn     No obvious day to day or daytime pattern/variability or assoc excess/ purulent sputum or mucus plugs or hemoptysis or cp or chest tightness, subjective wheeze or overt sinus or hb symptoms.    Also denies any obvious fluctuation of symptoms with weather or environmental changes or other aggravating or alleviating factors except as outlined above   No unusual exposure hx or h/o childhood pna/ asthma or knowledge of premature birth.  Current Allergies, Complete Past Medical History, Past Surgical History, Family History, and Social History were reviewed in [employer] record.  ROS  The following are not active complaints unless bolded Hoarseness, sore throat, dysphagia, dental problems, itching, sneezing,  nasal congestion or discharge of excess mucus or purulent secretions, ear ache,   fever, chills, sweats, unintended wt loss or wt gain, classically pleuritic or exertional cp,  orthopnea pnd or arm/hand swelling  or leg swelling, presyncope, palpitations, abdominal pain, anorexia, nausea, vomiting, diarrhea  or change in bowel habits or change in bladder habits, change in stools or change in urine, dysuria, hematuria,  rash, arthralgias, visual complaints, headache, numbness, weakness or ataxia or problems  with walking or coordination,  change in mood or  memory.            Outpatient Medications Prior to Visit  Medication Sig Dispense Refill   abiraterone acetate (ZYTIGA) 250 MG tablet Take 4 tablets (1,000 mg total) by mouth daily. Take on an empty stomach 1 hour before or 2 hours after a meal (Patient taking differently: Take 500 mg by mouth daily. Take on an empty stomach 1 hour before or 2 hours after a meal) 120 tablet 3   allopurinol (ZYLOPRIM) 100 MG tablet Take 100 mg by mouth daily.     Ascorbic Acid (VITAMIN C) 1000 MG tablet Take 1,000 mg by mouth daily.     aspirin EC 81 MG tablet Take 81 mg by mouth daily. Swallow whole.     cholecalciferol (VITAMIN D3) 25 MCG (1000 UNIT) tablet Take 1,000 Units by mouth daily.     diltiazem (CARDIZEM CD) 360 MG 24 hr capsule Take 360 mg by mouth daily.     HYDROcodone-acetaminophen (NORCO) 10-325 MG tablet Take 1 tablet by mouth every 6 (six) hours as needed. 30 tablet 0   losartan (COZAAR) 25 MG tablet TAKE 1 TABLET (25 MG TOTAL) BY MOUTH DAILY. 90 tablet 2   magnesium oxide (MAG-OX) 400 (240 Mg) MG tablet TAKE 1 TABLET BY MOUTH EVERY DAY 90 tablet 1   megestrol (MEGACE) 40 MG/ML suspension Take 400 mg by mouth daily as needed (appetite).     potassium chloride (MICRO-K)  10 MEQ CR capsule TAKE 2 CAPSULES BY MOUTH DAILY. 7 capsule 0   predniSONE (DELTASONE) 5 MG tablet Take 1 tablet (5 mg total) by mouth daily with breakfast. 90 tablet 1   TRELEGY ELLIPTA 200-62.5-25 MCG/ACT AEPB Inhale 1 puff into the lungs daily.     Zinc 20 MG CAPS Take 40 mg by mouth daily.     mirtazapine (REMERON) 30 MG tablet Take 30 mg by mouth daily. (Patient not taking: Reported on 01/09/2024)     cyclobenzaprine (FLEXERIL) 10 MG tablet Take 1 tablet (10 mg total) by mouth at bedtime. One tablet every night at bedtime as needed for spasm. (Patient not taking: Reported on 01/09/2024) 30 tablet 0   oxyCODONE-acetaminophen (PERCOCET/ROXICET) 5-325 MG tablet One tablet every  four hours as need for pain,  5 day limit. (Patient not taking: Reported on 01/09/2024) 25 tablet 0   predniSONE (STERAPRED UNI-PAK 21 TAB) 5 MG (21) TBPK tablet Take 6 pills first day; 5 pills second day; 4 pills third day; 3 pills fourth day; 2 pills next day and 1 pill last day. (Patient not taking: Reported on 01/09/2024) 21 tablet 0   Facility-Administered Medications Prior to Visit  Medication Dose Route Frequency Provider Last Rate Last Admin   degarelix (FIRMAGON) injection 80 mg  80 mg Subcutaneous Q28 days Bjorn Pippin, MD   80 mg at 12/18/23 1013    Past Medical History:  Diagnosis Date   BPH (benign prostatic hyperplasia)    COPD (chronic obstructive pulmonary disease) (HCC)    Depression    ED (erectile dysfunction)    Gout    Hyperlipidemia    Hypertension    Loss of hearing    Nocturia    Paroxysmal atrial fibrillation (HCC)    Prostate cancer (HCC)       Objective:     BP 136/65   Pulse 93   Ht 5\' 10"  (1.778 m)   Wt 151 lb (68.5 kg)   SpO2 95% Comment: room air  BMI 21.67 kg/m   SpO2: 95 % (room air)  W/c bound wm slt rattle   HEENT :  Oropharynx  clear/ dentition intact   Nasal turbinates nl    NECK :  without JVD/Nodes/TM/ nl carotid upstrokes bilaterally   LUNGS: no acc muscle use,  Mod barrel  contour chest wall with bilateral  Distant exp wheeze and  without cough on insp or exp maneuvers and mod  Hyperresonant  to  percussion bilaterally     CV:  RRR  no s3 or murmur or increase in P2, and no edema   ABD:  soft and nontender with pos mid insp Hoover's  in the supine position. No bruits or organomegaly appreciated, bowel sounds nl  MS:   Ext warm without deformities or   obvious joint restrictions , calf tenderness, cyanosis or clubbing  SKIN: warm and dry without lesions    NEURO:  alert, approp, nl sensorium with  no motor or cerebellar deficits apparent.          I personally reviewed images and agree with radiology impression as  follows:  CXR:   pa and lateral  07/14/23 COPD with mild bronchitic changes.       Assessment   No problem-specific Assessment & Plan notes found for this encounter.     Sandrea Hughs, MD 01/13/2024

## 2024-01-13 ENCOUNTER — Ambulatory Visit: Payer: HMO | Admitting: Internal Medicine

## 2024-01-13 ENCOUNTER — Other Ambulatory Visit: Payer: Self-pay

## 2024-01-13 ENCOUNTER — Encounter (HOSPITAL_COMMUNITY): Payer: Self-pay

## 2024-01-13 ENCOUNTER — Encounter: Payer: Self-pay | Admitting: Internal Medicine

## 2024-01-13 ENCOUNTER — Encounter (HOSPITAL_COMMUNITY)
Admission: RE | Admit: 2024-01-13 | Discharge: 2024-01-13 | Disposition: A | Discharge status: Home / Self Care | Payer: HMO | Source: Ambulatory Visit | Attending: Orthopedic Surgery | Admitting: Orthopedic Surgery

## 2024-01-13 VITALS — BP 128/69 | HR 81 | Temp 98.7°F | Resp 18

## 2024-01-13 VITALS — BP 136/65 | HR 93 | Ht 70.0 in | Wt 151.0 lb

## 2024-01-13 DIAGNOSIS — C61 Malignant neoplasm of prostate: Secondary | ICD-10-CM | POA: Diagnosis not present

## 2024-01-13 DIAGNOSIS — C799 Secondary malignant neoplasm of unspecified site: Secondary | ICD-10-CM | POA: Diagnosis not present

## 2024-01-13 DIAGNOSIS — I10 Essential (primary) hypertension: Secondary | ICD-10-CM

## 2024-01-13 DIAGNOSIS — J449 Chronic obstructive pulmonary disease, unspecified: Secondary | ICD-10-CM | POA: Diagnosis not present

## 2024-01-13 DIAGNOSIS — Z01812 Encounter for preprocedural laboratory examination: Secondary | ICD-10-CM | POA: Diagnosis not present

## 2024-01-13 DIAGNOSIS — M1611 Unilateral primary osteoarthritis, right hip: Secondary | ICD-10-CM | POA: Diagnosis not present

## 2024-01-13 DIAGNOSIS — Z01818 Encounter for other preprocedural examination: Secondary | ICD-10-CM | POA: Diagnosis present

## 2024-01-13 DIAGNOSIS — Z79899 Other long term (current) drug therapy: Secondary | ICD-10-CM | POA: Insufficient documentation

## 2024-01-13 DIAGNOSIS — F17200 Nicotine dependence, unspecified, uncomplicated: Secondary | ICD-10-CM | POA: Insufficient documentation

## 2024-01-13 DIAGNOSIS — F17210 Nicotine dependence, cigarettes, uncomplicated: Secondary | ICD-10-CM

## 2024-01-13 DIAGNOSIS — I480 Paroxysmal atrial fibrillation: Secondary | ICD-10-CM | POA: Insufficient documentation

## 2024-01-13 DIAGNOSIS — M87851 Other osteonecrosis, right femur: Secondary | ICD-10-CM | POA: Insufficient documentation

## 2024-01-13 DIAGNOSIS — J42 Unspecified chronic bronchitis: Secondary | ICD-10-CM

## 2024-01-13 HISTORY — DX: Unspecified asthma, uncomplicated: J45.909

## 2024-01-13 HISTORY — DX: Unspecified osteoarthritis, unspecified site: M19.90

## 2024-01-13 HISTORY — DX: Anemia, unspecified: D64.9

## 2024-01-13 LAB — BASIC METABOLIC PANEL
Anion gap: 11 (ref 5–15)
BUN: 19 mg/dL (ref 8–23)
CO2: 18 mmol/L — ABNORMAL LOW (ref 22–32)
Calcium: 10.4 mg/dL — ABNORMAL HIGH (ref 8.9–10.3)
Chloride: 103 mmol/L (ref 98–111)
Creatinine, Ser: 1.17 mg/dL (ref 0.61–1.24)
GFR, Estimated: >60 mL/min (ref >60)
Glucose, Bld: 127 mg/dL — ABNORMAL HIGH (ref 70–99)
Potassium: 4.3 mmol/L (ref 3.5–5.1)
Sodium: 132 mmol/L — ABNORMAL LOW (ref 135–145)

## 2024-01-13 LAB — CBC
HCT: 32 % — ABNORMAL LOW (ref 39.0–52.0)
Hemoglobin: 10.2 g/dL — ABNORMAL LOW (ref 13.0–17.0)
MCH: 29.2 pg (ref 26.0–34.0)
MCHC: 31.9 g/dL (ref 30.0–36.0)
MCV: 91.7 fL (ref 80.0–100.0)
Platelets: 401 10*3/uL — ABNORMAL HIGH (ref 150–400)
RBC: 3.49 MIL/uL — ABNORMAL LOW (ref 4.22–5.81)
RDW: 16.7 % — ABNORMAL HIGH (ref 11.5–15.5)
WBC: 12.5 10*3/uL — ABNORMAL HIGH (ref 4.0–10.5)
nRBC: 0 % (ref 0.0–0.2)

## 2024-01-13 LAB — SURGICAL PCR SCREEN
MRSA, PCR: NEGATIVE
Staphylococcus aureus: NEGATIVE

## 2024-01-13 MED ORDER — METHYLPREDNISOLONE ACETATE 80 MG/ML IJ SUSP
120.0000 mg | Freq: Once | INTRAMUSCULAR | Status: AC
  Administered 2024-01-13: 120 mg via INTRAMUSCULAR

## 2024-01-13 NOTE — Assessment & Plan Note (Signed)
 Counseled re importance of smoking cessation but did not meet time criteria for separate billing            Each maintenance medication was reviewed in detail including emphasizing most importantly the difference between maintenance and prns and under what circumstances the prns are to be triggered using an action plan format where appropriate.  Total time for H and P, chart review, counseling, reviewing hfa/dpi/neb device(s) and generating customized AVS unique to this office visit / same day charting = 42 min moderately high risk preop pulmonary eval

## 2024-01-13 NOTE — Assessment & Plan Note (Addendum)
 Active smoker / limited by R hip pain  - 01/13/2024  After extensive coaching inhaler device,  effectiveness =    75% with DPI / hfa   Likely moderate copd/AB  fueled in part by continued smoking but no standardized "red line" for clearance for surgery on hip as inpt so I cleared him today but I did rec the following "tune up" measures  1) no smoking from now until surgery  2) depomedrol 120 mg IM today and double daily pred rx = 5 mg x 2 between now and surgery  3) continue trelegy  4) supplennt with hfa/ neb as needed but esp on am of surgery  5) early mobilization / minimal sedation post op  6) pulmonary periop prn

## 2024-01-13 NOTE — Patient Instructions (Addendum)
 Plan A = Automatic = Always=    Trelegy 100 one click 1st thing in am  Prednisone 5 mg x 2 each am until surgery   Plan B = Backup (to supplement plan A, not to replace it) Only use your albuterol inhaler as a rescue medication to be used if you can't catch your breath by resting or doing a relaxed purse lip breathing pattern.  - The less you use it, the better it will work when you need it. - Ok to use the inhaler up to 2 puffs  every 4 hours if you must but call for appointment if use goes up over your usual need - Don't leave home without it !!  (think of it like the spare tire for your car)   Plan C = Crisis (instead of Plan B but only if Plan B stops working) - only use your albuterol nebulizer if you first try Plan B and it fails to help > ok to use the nebulizer up to every 4 hours but if start needing it regularly call for immediate appointment  Also  Ok to try albuterol  right before you leave home for  surgery and also  15 min before an activity (on alternating days)  that you know would usually make you short of breath and see if it makes any difference and if makes none then don't take albuterol after activity unless you can't catch your breath as this means it's the resting that helps, not the albuterol.  For cough/ congestion > mucinex up to maximum of  1200 mg every 12 hours as needed    Depomedrol 120 mg IM   You are cleared for surgery - you should not smoker prior to surgery.

## 2024-01-14 ENCOUNTER — Encounter: Payer: Self-pay | Admitting: Urology

## 2024-01-14 NOTE — Progress Notes (Signed)
 Anesthesia Chart Review   Case: [id number] Date/Time: 01/20/24 1215   Procedure: ARTHROPLASTY, HIP, TOTAL, ANTERIOR APPROACH (Right: Hip)   Anesthesia type: Spinal   Pre-op diagnosis: Right hip avascular necrosis   Location: [facility] ROOM 09 / [facility]   Surgeons: Durene Romans, MD       DISCUSSION:78 y.o. smoker with h/o HTN, COPD, PAF, BPH, metastatic prostate cancer on abiraterone and prednisone 5mg  dialy and monthly Firmagon, right hip AVN scheduled for above procedure 01/20/2024 with Dr. Durene Romans.   Pt last seen by cardiology 07/22/2023.  Per OV note shortness of breath not related to heart failure, unremarkable workup.  Attributed to chemo medication and COPD. Referred to pulmonology. Pt is no longer on anticoagulation due to h/o bleeding, pt declined.   Pt seen by pulmonology 01/13/2024. Per OV note, "Likely moderate copd/AB  fueled in part by continued smoking but no standardized "red line" for clearance for surgery on hip as inpt so I cleared him today but I did rec the following "tune up" measures   1) no smoking from now until surgery  2) depomedrol 120 mg IM today and double daily pred rx = 5 mg x 2 between now and surgery  3) continue trelegy  4) supplennt with hfa/ neb as needed but esp on am of surgery  5) early mobilization / minimal sedation post op  6) pulmonary periop prn "  VS: BP 128/69   Pulse 81   Temp 37.1 C (Oral)   Resp 18   SpO2 98%   PROVIDERS: Richardean Chimera, MD is PCP   Dina Rich, MD is Cardiologist  LABS: Labs reviewed: Acceptable for surgery. (all labs ordered are listed, but only abnormal results are displayed)  Labs Reviewed  BASIC METABOLIC PANEL - Abnormal; Notable for the following components:      Result Value   Sodium 132 (*)    CO2 18 (*)    Glucose, Bld 127 (*)    Calcium 10.4 (*)    All other components within normal limits  CBC - Abnormal; Notable for the following components:   WBC 12.5 (*)    RBC 3.49 (*)    Hemoglobin  10.2 (*)    HCT 32.0 (*)    RDW 16.7 (*)    Platelets 401 (*)    All other components within normal limits  SURGICAL PCR SCREEN  TYPE AND SCREEN     IMAGES:   EKG:   CV: Echo 07/15/2023 1. Left ventricular ejection fraction, by estimation, is 60 to 65%. The  left ventricle has normal function. The left ventricle has no regional  wall motion abnormalities. Left ventricular diastolic parameters are  consistent with Grade I diastolic  dysfunction (impaired relaxation).   2. Right ventricular systolic function is normal. The right ventricular  size is normal. There is normal pulmonary artery systolic pressure. The  estimated right ventricular systolic pressure is 28.0 mmHg.   3. The mitral valve is grossly normal. Mild mitral valve regurgitation.  No evidence of mitral stenosis.   4. The aortic valve is tricuspid. Aortic valve regurgitation is not  visualized. No aortic stenosis is present.   5. The inferior vena cava is normal in size with greater than 50%  respiratory variability, suggesting right atrial pressure of 3 mmHg.  Past Medical History:  Diagnosis Date   Anemia    Arthritis    Asthma    BPH (benign prostatic hyperplasia)    COPD (chronic obstructive pulmonary disease) (HCC)  Depression    ED (erectile dysfunction)    Gout    Hyperlipidemia    Hypertension    Loss of hearing    Nocturia    Paroxysmal atrial fibrillation (HCC)    Prostate cancer [facility])     Past Surgical History:  Procedure Laterality Date   CARDIOVASCULAR STRESS TEST  12/31/2004   normal perfuction nuclear study w/ no evidence ischemia /  normal LV function and wall motion , ef 52%   PROSTATECTOMY  2019   TOTAL HIP ARTHROPLASTY Left     MEDICATIONS:  abiraterone acetate (ZYTIGA) 250 MG tablet   allopurinol (ZYLOPRIM) 100 MG tablet   Ascorbic Acid (VITAMIN C) 1000 MG tablet   aspirin EC 81 MG tablet   cholecalciferol (VITAMIN D3) 25 MCG (1000 UNIT) tablet   diltiazem (CARDIZEM CD)  360 MG 24 hr capsule   HYDROcodone-acetaminophen (NORCO) 10-325 MG tablet   losartan (COZAAR) 25 MG tablet   magnesium oxide (MAG-OX) 400 (240 Mg) MG tablet   megestrol (MEGACE) 40 MG/ML suspension   potassium chloride (MICRO-K) 10 MEQ CR capsule   predniSONE (DELTASONE) 5 MG tablet   TRELEGY ELLIPTA 200-62.5-25 MCG/ACT AEPB   Zinc 20 MG CAPS    degarelix (FIRMAGON) injection 80 mg    [facility] Ward, PA-C [facility] Pre-Surgical Testing [phone]

## 2024-01-14 NOTE — Telephone Encounter (Signed)
 FYI

## 2024-01-14 NOTE — Telephone Encounter (Signed)
 FYI and advise

## 2024-01-14 NOTE — Anesthesia Preprocedure Evaluation (Addendum)
 Anesthesia Evaluation  Patient identified by MRN, date of birth, ID band Patient awake    Reviewed: Allergy & Precautions, NPO status , Patient's Chart, lab work & pertinent test results  Airway Mallampati: II  TM Distance: >3 FB Neck ROM: Full    Dental no notable dental hx.    Pulmonary asthma , COPD, Current Smoker and Patient abstained from smoking.   Pulmonary exam normal        Cardiovascular hypertension, +CHF   Rhythm:Regular Rate:Normal     Neuro/Psych    Depression    negative neurological ROS     GI/Hepatic Neg liver ROS,GERD  ,,  Endo/Other  negative endocrine ROS    Renal/GU negative Renal ROS  negative genitourinary   Musculoskeletal  (+) Arthritis , Osteoarthritis,    Abdominal Normal abdominal exam  (+)   Peds  Hematology  (+) Blood dyscrasia, anemia   Anesthesia Other Findings   Reproductive/Obstetrics                             Anesthesia Physical Anesthesia Plan  ASA: 3  Anesthesia Plan: MAC and Spinal   Post-op Pain Management:    Induction: Intravenous  PONV Risk Score and Plan: Ondansetron, Dexamethasone and Treatment may vary due to age or medical condition  Airway Management Planned: Simple Face Mask and Nasal Cannula  Additional Equipment: None  Intra-op Plan:   Post-operative Plan:   Informed Consent: I have reviewed the patients History and Physical, chart, labs and discussed the procedure including the risks, benefits and alternatives for the proposed anesthesia with the patient or authorized representative who has indicated his/her understanding and acceptance.     Dental advisory given  Plan Discussed with: CRNA  Anesthesia Plan Comments: (See PAT note 01/13/2024)       Anesthesia Quick Evaluation

## 2024-01-15 ENCOUNTER — Ambulatory Visit: Payer: Medicare HMO

## 2024-01-15 ENCOUNTER — Ambulatory Visit (INDEPENDENT_AMBULATORY_CARE_PROVIDER_SITE_OTHER): Payer: HMO

## 2024-01-15 DIAGNOSIS — C61 Malignant neoplasm of prostate: Secondary | ICD-10-CM

## 2024-01-15 MED ORDER — DEGARELIX ACETATE 80 MG ~~LOC~~ SOLR
80.0000 mg | Freq: Once | SUBCUTANEOUS | Status: AC
  Administered 2024-01-15: 80 mg via SUBCUTANEOUS

## 2024-01-15 NOTE — Progress Notes (Signed)
 Firmagon Sub Q Injection  Due to Prostate Cancer patient is present today for a Firmagon Injection.  Order received and reviewed and authorization verification reviewed.   Medication: Deborra Medina (Degarelix)  Dose: 80mg  Location: left upper abdomen cleaned and prepped with alcohol prior to injection Lot: [id number] Exp: 09/04/2025  Patient tolerated well, no complications were noted Band aid applied over injection site.   Performed by: Alfonse Spruce. CMA  Follow up: 1 month for injection. Appointment scheduled with patient.

## 2024-01-15 NOTE — Telephone Encounter (Signed)
**Note De-identified  [place] Obfuscation** Please advise 

## 2024-01-19 ENCOUNTER — Inpatient Hospital Stay: Payer: HMO

## 2024-01-20 ENCOUNTER — Observation Stay (HOSPITAL_COMMUNITY)
Admission: RE | Admit: 2024-01-20 | Discharge: 2024-01-21 | Disposition: A | Discharge status: Home / Self Care | Payer: HMO | Source: Ambulatory Visit | Attending: Orthopedic Surgery | Admitting: Orthopedic Surgery

## 2024-01-20 ENCOUNTER — Other Ambulatory Visit: Payer: Self-pay

## 2024-01-20 ENCOUNTER — Observation Stay (HOSPITAL_COMMUNITY)

## 2024-01-20 ENCOUNTER — Encounter (HOSPITAL_COMMUNITY)
Admission: RE | Disposition: A | Discharge status: Home / Self Care | Payer: Self-pay | Source: Ambulatory Visit | Attending: Orthopedic Surgery

## 2024-01-20 ENCOUNTER — Ambulatory Visit (HOSPITAL_COMMUNITY): Payer: Self-pay | Admitting: Physician Assistant

## 2024-01-20 ENCOUNTER — Ambulatory Visit (HOSPITAL_BASED_OUTPATIENT_CLINIC_OR_DEPARTMENT_OTHER): Admitting: Anesthesiology

## 2024-01-20 ENCOUNTER — Encounter (HOSPITAL_COMMUNITY): Payer: Self-pay | Admitting: Orthopedic Surgery

## 2024-01-20 ENCOUNTER — Ambulatory Visit (HOSPITAL_COMMUNITY)

## 2024-01-20 DIAGNOSIS — Z471 Aftercare following joint replacement surgery: Secondary | ICD-10-CM | POA: Diagnosis not present

## 2024-01-20 DIAGNOSIS — I110 Hypertensive heart disease with heart failure: Secondary | ICD-10-CM | POA: Insufficient documentation

## 2024-01-20 DIAGNOSIS — F17210 Nicotine dependence, cigarettes, uncomplicated: Secondary | ICD-10-CM | POA: Insufficient documentation

## 2024-01-20 DIAGNOSIS — I4891 Unspecified atrial fibrillation: Secondary | ICD-10-CM | POA: Diagnosis not present

## 2024-01-20 DIAGNOSIS — Z79899 Other long term (current) drug therapy: Secondary | ICD-10-CM | POA: Insufficient documentation

## 2024-01-20 DIAGNOSIS — M87051 Idiopathic aseptic necrosis of right femur: Secondary | ICD-10-CM

## 2024-01-20 DIAGNOSIS — I5032 Chronic diastolic (congestive) heart failure: Secondary | ICD-10-CM | POA: Diagnosis not present

## 2024-01-20 DIAGNOSIS — M1611 Unilateral primary osteoarthritis, right hip: Principal | ICD-10-CM | POA: Insufficient documentation

## 2024-01-20 DIAGNOSIS — Z7982 Long term (current) use of aspirin: Secondary | ICD-10-CM | POA: Insufficient documentation

## 2024-01-20 DIAGNOSIS — J449 Chronic obstructive pulmonary disease, unspecified: Secondary | ICD-10-CM | POA: Diagnosis not present

## 2024-01-20 DIAGNOSIS — Z01818 Encounter for other preprocedural examination: Secondary | ICD-10-CM

## 2024-01-20 DIAGNOSIS — Z8546 Personal history of malignant neoplasm of prostate: Secondary | ICD-10-CM | POA: Insufficient documentation

## 2024-01-20 DIAGNOSIS — I480 Paroxysmal atrial fibrillation: Secondary | ICD-10-CM | POA: Insufficient documentation

## 2024-01-20 DIAGNOSIS — Z96642 Presence of left artificial hip joint: Secondary | ICD-10-CM | POA: Insufficient documentation

## 2024-01-20 DIAGNOSIS — Z96641 Presence of right artificial hip joint: Secondary | ICD-10-CM | POA: Diagnosis not present

## 2024-01-20 HISTORY — PX: TOTAL HIP ARTHROPLASTY: SHX124

## 2024-01-20 LAB — TYPE AND SCREEN
ABO/RH(D): O POS
ABO/RH(D): O POS
Antibody Screen: NEGATIVE
Antibody Screen: NEGATIVE

## 2024-01-20 SURGERY — ARTHROPLASTY, HIP, TOTAL, ANTERIOR APPROACH
Anesthesia: Spinal | Site: Hip | Laterality: Right

## 2024-01-20 MED ORDER — BISACODYL 10 MG RE SUPP: 10.0000 mg | Freq: Every day | RECTAL | Status: DC | PRN

## 2024-01-20 MED ORDER — MORPHINE SULFATE (PF) 2 MG/ML IV SOLN: 0.5000 mg | INTRAVENOUS | Status: DC | PRN

## 2024-01-20 MED ORDER — DEXAMETHASONE SODIUM PHOSPHATE 10 MG/ML IJ SOLN
8.0000 mg | Freq: Once | INTRAMUSCULAR | Status: AC
  Administered 2024-01-20: 8 mg via INTRAVENOUS

## 2024-01-20 MED ORDER — ONDANSETRON HCL 4 MG/2ML IJ SOLN: 4.0000 mg | Freq: Four times a day (QID) | INTRAMUSCULAR | Status: DC | PRN

## 2024-01-20 MED ORDER — ACETAMINOPHEN 500 MG PO TABS
ORAL_TABLET | ORAL | Status: AC
Start: 2024-01-20 — End: 2024-01-20
  Filled 2024-01-20: qty 2

## 2024-01-20 MED ORDER — CEFAZOLIN SODIUM-DEXTROSE 2-4 GM/100ML-% IV SOLN
2.0000 g | INTRAVENOUS | Status: AC
  Administered 2024-01-20: 2 g via INTRAVENOUS

## 2024-01-20 MED ORDER — PREDNISONE 5 MG PO TABS
5.0000 mg | ORAL_TABLET | Freq: Every day | ORAL | Status: DC
  Administered 2024-01-21: 5 mg via ORAL
  Filled 2024-01-20: qty 1

## 2024-01-20 MED ORDER — SODIUM CHLORIDE 0.9% FLUSH: 3.0000 mL | Freq: Two times a day (BID) | INTRAVENOUS | Status: DC

## 2024-01-20 MED ORDER — 0.9 % SODIUM CHLORIDE (POUR BTL) OPTIME
TOPICAL | Status: DC | PRN
  Administered 2024-01-20: 1000 mL

## 2024-01-20 MED ORDER — ALLOPURINOL 100 MG PO TABS
100.0000 mg | ORAL_TABLET | Freq: Every day | ORAL | Status: DC
  Administered 2024-01-21: 100 mg via ORAL
  Filled 2024-01-20: qty 1

## 2024-01-20 MED ORDER — RIVAROXABAN 10 MG PO TABS
10.0000 mg | ORAL_TABLET | Freq: Every day | ORAL | Status: DC
Start: 2024-01-21 — End: 2024-01-21
  Administered 2024-01-21: 10 mg via ORAL
  Filled 2024-01-20: qty 1

## 2024-01-20 MED ORDER — METHOCARBAMOL 1000 MG/10ML IJ SOLN: 500.0000 mg | Freq: Four times a day (QID) | INTRAMUSCULAR | Status: DC | PRN

## 2024-01-20 MED ORDER — POLYETHYLENE GLYCOL 3350 17 G PO PACK
17.0000 g | PACK | Freq: Two times a day (BID) | ORAL | Status: DC
  Administered 2024-01-21: 17 g via ORAL
  Filled 2024-01-20 (×2): qty 1

## 2024-01-20 MED ORDER — BUPIVACAINE-EPINEPHRINE (PF) 0.25% -1:200000 IJ SOLN
INTRAMUSCULAR | Status: AC
  Filled 2024-01-20: qty 30

## 2024-01-20 MED ORDER — SODIUM CHLORIDE 0.9% FLUSH: 3.0000 mL | INTRAVENOUS | Status: DC | PRN

## 2024-01-20 MED ORDER — SODIUM CHLORIDE 0.9% FLUSH
3.0000 mL | Freq: Two times a day (BID) | INTRAVENOUS | Status: DC
  Administered 2024-01-21: 10 mL via INTRAVENOUS

## 2024-01-20 MED ORDER — ONDANSETRON HCL 4 MG PO TABS: 4.0000 mg | ORAL_TABLET | Freq: Four times a day (QID) | ORAL | Status: DC | PRN

## 2024-01-20 MED ORDER — POTASSIUM CHLORIDE CRYS ER 20 MEQ PO TBCR
20.0000 meq | EXTENDED_RELEASE_TABLET | Freq: Every day | ORAL | Status: DC
  Administered 2024-01-21: 20 meq via ORAL
  Filled 2024-01-20: qty 1

## 2024-01-20 MED ORDER — LACTATED RINGERS IV SOLN: INTRAVENOUS | Status: DC

## 2024-01-20 MED ORDER — CHLORHEXIDINE GLUCONATE 0.12 % MT SOLN
15.0000 mL | Freq: Once | OROMUCOSAL | Status: AC
  Administered 2024-01-20: 15 mL via OROMUCOSAL

## 2024-01-20 MED ORDER — PROPOFOL 500 MG/50ML IV EMUL
INTRAVENOUS | Status: DC | PRN
  Administered 2024-01-20: 50 mg via INTRAVENOUS
  Administered 2024-01-20: 50 ug/kg/min via INTRAVENOUS
  Administered 2024-01-20: 10 mg via INTRAVENOUS
  Administered 2024-01-20: 50 mg via INTRAVENOUS
  Administered 2024-01-20 (×2): 30 mg via INTRAVENOUS

## 2024-01-20 MED ORDER — PHENOL 1.4 % MT LIQD: 1.0000 | OROMUCOSAL | Status: DC | PRN

## 2024-01-20 MED ORDER — MENTHOL 3 MG MT LOZG: 1.0000 | LOZENGE | OROMUCOSAL | Status: DC | PRN

## 2024-01-20 MED ORDER — METOCLOPRAMIDE HCL 5 MG PO TABS: 5.0000 mg | ORAL_TABLET | Freq: Three times a day (TID) | ORAL | Status: DC | PRN

## 2024-01-20 MED ORDER — ORAL CARE MOUTH RINSE: 15.0000 mL | Freq: Once | OROMUCOSAL | Status: AC

## 2024-01-20 MED ORDER — CEFAZOLIN SODIUM-DEXTROSE 2-4 GM/100ML-% IV SOLN
INTRAVENOUS | Status: AC
  Filled 2024-01-20: qty 100

## 2024-01-20 MED ORDER — ONDANSETRON HCL 4 MG/2ML IJ SOLN
INTRAMUSCULAR | Status: DC | PRN
Start: 2024-01-20 — End: 2024-01-20
  Administered 2024-01-20: 4 mg via INTRAVENOUS

## 2024-01-20 MED ORDER — CEFAZOLIN SODIUM-DEXTROSE 2-4 GM/100ML-% IV SOLN
2.0000 g | Freq: Four times a day (QID) | INTRAVENOUS | Status: AC
  Administered 2024-01-20 – 2024-01-21 (×2): 2 g via INTRAVENOUS
  Filled 2024-01-20 (×2): qty 100

## 2024-01-20 MED ORDER — HYDROCODONE-ACETAMINOPHEN 5-325 MG PO TABS
1.0000 | ORAL_TABLET | ORAL | Status: DC | PRN
  Administered 2024-01-20: 2 via ORAL
  Administered 2024-01-21: 1 via ORAL
  Filled 2024-01-20: qty 1
  Filled 2024-01-20: qty 2

## 2024-01-20 MED ORDER — POVIDONE-IODINE 10 % EX SWAB: 2.0000 | Freq: Once | CUTANEOUS | Status: DC

## 2024-01-20 MED ORDER — LOSARTAN POTASSIUM 25 MG PO TABS
25.0000 mg | ORAL_TABLET | Freq: Every day | ORAL | Status: DC
  Administered 2024-01-21: 25 mg via ORAL
  Filled 2024-01-20: qty 1

## 2024-01-20 MED ORDER — FLUTICASONE FUROATE-VILANTEROL 200-25 MCG/ACT IN AEPB
1.0000 | INHALATION_SPRAY | Freq: Every day | RESPIRATORY_TRACT | Status: DC
  Administered 2024-01-21: 1 via RESPIRATORY_TRACT
  Filled 2024-01-20: qty 28

## 2024-01-20 MED ORDER — SODIUM CHLORIDE (PF) 0.9 % IJ SOLN
INTRAMUSCULAR | Status: DC | PRN
  Administered 2024-01-20: 61 mL

## 2024-01-20 MED ORDER — TRANEXAMIC ACID-NACL 1000-0.7 MG/100ML-% IV SOLN
1000.0000 mg | INTRAVENOUS | Status: AC
  Administered 2024-01-20: 1000 mg via INTRAVENOUS

## 2024-01-20 MED ORDER — METHOCARBAMOL 500 MG PO TABS
500.0000 mg | ORAL_TABLET | Freq: Four times a day (QID) | ORAL | Status: DC | PRN
  Administered 2024-01-20 – 2024-01-21 (×2): 500 mg via ORAL
  Filled 2024-01-20 (×2): qty 1

## 2024-01-20 MED ORDER — MAGNESIUM OXIDE -MG SUPPLEMENT 400 (240 MG) MG PO TABS
400.0000 mg | ORAL_TABLET | Freq: Every day | ORAL | Status: DC
  Administered 2024-01-21: 400 mg via ORAL
  Filled 2024-01-20: qty 1

## 2024-01-20 MED ORDER — TRANEXAMIC ACID-NACL 1000-0.7 MG/100ML-% IV SOLN
1000.0000 mg | Freq: Once | INTRAVENOUS | Status: AC
  Administered 2024-01-20: 1000 mg via INTRAVENOUS
  Filled 2024-01-20: qty 100

## 2024-01-20 MED ORDER — ALBUMIN HUMAN 5 % IV SOLN: INTRAVENOUS | Status: DC | PRN

## 2024-01-20 MED ORDER — DILTIAZEM HCL ER COATED BEADS 180 MG PO CP24
360.0000 mg | ORAL_CAPSULE | Freq: Every day | ORAL | Status: DC
  Administered 2024-01-21: 360 mg via ORAL
  Filled 2024-01-20: qty 2

## 2024-01-20 MED ORDER — UMECLIDINIUM BROMIDE 62.5 MCG/ACT IN AEPB
1.0000 | INHALATION_SPRAY | Freq: Every day | RESPIRATORY_TRACT | Status: DC
  Administered 2024-01-21: 1 via RESPIRATORY_TRACT
  Filled 2024-01-20: qty 7

## 2024-01-20 MED ORDER — ONDANSETRON HCL 4 MG/2ML IJ SOLN
INTRAMUSCULAR | Status: AC
  Filled 2024-01-20: qty 2

## 2024-01-20 MED ORDER — SENNA 8.6 MG PO TABS
2.0000 | ORAL_TABLET | Freq: Every day | ORAL | Status: DC
  Administered 2024-01-20: 17.2 mg via ORAL
  Filled 2024-01-20: qty 2

## 2024-01-20 MED ORDER — BUPIVACAINE IN DEXTROSE 0.75-8.25 % IT SOLN
INTRATHECAL | Status: DC | PRN
  Administered 2024-01-20: 1.8 mL via INTRATHECAL

## 2024-01-20 MED ORDER — DROPERIDOL 2.5 MG/ML IJ SOLN: 0.6250 mg | Freq: Once | INTRAMUSCULAR | Status: DC | PRN

## 2024-01-20 MED ORDER — METOCLOPRAMIDE HCL 5 MG/ML IJ SOLN: 5.0000 mg | Freq: Three times a day (TID) | INTRAMUSCULAR | Status: DC | PRN

## 2024-01-20 MED ORDER — ACETAMINOPHEN 500 MG PO TABS
1000.0000 mg | ORAL_TABLET | Freq: Once | ORAL | Status: AC
  Administered 2024-01-20: 1000 mg via ORAL

## 2024-01-20 MED ORDER — KETOROLAC TROMETHAMINE 30 MG/ML IJ SOLN
INTRAMUSCULAR | Status: AC
  Filled 2024-01-20: qty 1

## 2024-01-20 MED ORDER — ACETAMINOPHEN 325 MG PO TABS: 325.0000 mg | ORAL_TABLET | Freq: Four times a day (QID) | ORAL | Status: DC | PRN

## 2024-01-20 MED ORDER — ALUM & MAG HYDROXIDE-SIMETH 200-200-20 MG/5ML PO SUSP: 30.0000 mL | ORAL | Status: DC | PRN

## 2024-01-20 MED ORDER — TRANEXAMIC ACID-NACL 1000-0.7 MG/100ML-% IV SOLN
INTRAVENOUS | Status: AC
  Filled 2024-01-20: qty 100

## 2024-01-20 MED ORDER — ALBUMIN HUMAN 5 % IV SOLN
INTRAVENOUS | Status: AC
  Filled 2024-01-20: qty 250

## 2024-01-20 MED ORDER — DIPHENHYDRAMINE HCL 12.5 MG/5ML PO ELIX
12.5000 mg | ORAL_SOLUTION | ORAL | Status: DC | PRN
  Administered 2024-01-20: 12.5 mg via ORAL
  Filled 2024-01-20 (×2): qty 10

## 2024-01-20 MED ORDER — DEXAMETHASONE SODIUM PHOSPHATE 10 MG/ML IJ SOLN
INTRAMUSCULAR | Status: AC
  Filled 2024-01-20: qty 1

## 2024-01-20 MED ORDER — HYDROCODONE-ACETAMINOPHEN 7.5-325 MG PO TABS
1.0000 | ORAL_TABLET | ORAL | Status: DC | PRN
Start: 2024-01-20 — End: 2024-01-21
  Administered 2024-01-20: 2 via ORAL
  Filled 2024-01-20: qty 2

## 2024-01-20 MED ORDER — HYDROMORPHONE HCL 1 MG/ML IJ SOLN: 0.2500 mg | INTRAMUSCULAR | Status: DC | PRN

## 2024-01-20 MED ORDER — MEGESTROL ACETATE 40 MG/ML PO SUSP: 400.0000 mg | Freq: Every day | ORAL | Status: DC | PRN

## 2024-01-20 MED ORDER — PROPOFOL 1000 MG/100ML IV EMUL
INTRAVENOUS | Status: AC
  Filled 2024-01-20: qty 100

## 2024-01-20 MED ORDER — ABIRATERONE ACETATE 250 MG PO TABS: 500.0000 mg | ORAL_TABLET | Freq: Every day | ORAL | Status: DC

## 2024-01-20 SURGICAL SUPPLY — 40 items
ARTICULEZE HEAD (Hips) ×1 IMPLANT
BAG COUNTER SPONGE SURGICOUNT (BAG) IMPLANT
BAG ZIPLOCK 12X15 (MISCELLANEOUS) IMPLANT
BLADE SAG 18X100X1.27 (BLADE) ×1 IMPLANT
COVER PERINEAL POST (MISCELLANEOUS) ×1 IMPLANT
COVER SURGICAL LIGHT HANDLE (MISCELLANEOUS) ×1 IMPLANT
CUP ACET PINNACLE SECTR 56MM (Hips) IMPLANT
DERMABOND ADVANCED .7 DNX12 (GAUZE/BANDAGES/DRESSINGS) ×1 IMPLANT
DRAPE FOOT SWITCH (DRAPES) ×1 IMPLANT
DRAPE STERI IOBAN 125X83 (DRAPES) ×1 IMPLANT
DRAPE U-SHAPE 47X51 STRL (DRAPES) ×2 IMPLANT
DRESSING AQUACEL AG SP 3.5X10 (GAUZE/BANDAGES/DRESSINGS) ×1 IMPLANT
DRSG AQUACEL AG SP 3.5X10 (GAUZE/BANDAGES/DRESSINGS) ×1 IMPLANT
DURAPREP 26ML APPLICATOR (WOUND CARE) ×1 IMPLANT
ELECT REM PT RETURN 15FT ADLT (MISCELLANEOUS) ×1 IMPLANT
GLOVE BIO SURGEON STRL SZ 6 (GLOVE) ×1 IMPLANT
GLOVE BIOGEL PI IND STRL 6.5 (GLOVE) ×1 IMPLANT
GLOVE BIOGEL PI IND STRL 7.5 (GLOVE) ×1 IMPLANT
GLOVE ORTHO TXT STRL SZ7.5 (GLOVE) ×2 IMPLANT
GOWN STRL REUS W/ TWL LRG LVL3 (GOWN DISPOSABLE) ×2 IMPLANT
HEAD ARTICULEZE (Hips) IMPLANT
HOLDER FOLEY CATH W/STRAP (MISCELLANEOUS) ×1 IMPLANT
KIT TURNOVER KIT A (KITS) IMPLANT
MANIFOLD NEPTUNE II (INSTRUMENTS) ×1 IMPLANT
NDL SAFETY ECLIPSE 18X1.5 (NEEDLE) IMPLANT
PACK ANTERIOR HIP CUSTOM (KITS) ×1 IMPLANT
PINNACLE ALTRX PLUS 4 N 36X56 (Hips) IMPLANT
PINNACLE SECTOR CUP 56MM (Hips) ×1 IMPLANT
SCREW 6.5MMX30MM (Screw) IMPLANT
STEM FEMORAL SZ9 HIGH ACTIS (Stem) IMPLANT
SUT MNCRL AB 4-0 PS2 18 (SUTURE) ×1 IMPLANT
SUT STRATAFIX 0 PDS 27 VIOLET (SUTURE) ×1 IMPLANT
SUT VIC AB 1 CT1 36 (SUTURE) ×3 IMPLANT
SUT VIC AB 2-0 CT1 TAPERPNT 27 (SUTURE) ×2 IMPLANT
SUTURE STRATFX 0 PDS 27 VIOLET (SUTURE) ×1 IMPLANT
SYR 3ML LL SCALE MARK (SYRINGE) IMPLANT
TOWEL GREEN STERILE FF (TOWEL DISPOSABLE) ×1 IMPLANT
TRAY FOLEY MTR SLVR 16FR STAT (SET/KITS/TRAYS/PACK) ×1 IMPLANT
TUBE SUCTION HIGH CAP CLEAR NV (SUCTIONS) ×1 IMPLANT
WATER STERILE IRR 1000ML POUR (IV SOLUTION) ×1 IMPLANT

## 2024-01-20 NOTE — H&P (Signed)
 TOTAL HIP ADMISSION H&P  Patient is admitted for right total hip arthroplasty.  Therapy Plans: HEP Disposition: Home with wife Planned DVT Prophylaxis: aspirin 81mg  BID DME needed: none PCP: Dan [name] - clearance received TXA: IV Allergies: tamsulosin - hives Anesthesia Concerns: none BMI: 21.2 Last HgbA1c: Not diabetic Smokes 1 ppd   Other: - abx due to smoking - hx of a fib --- has always just taken aspirin - Normally took hydrocodone 10 mg q6h at baseline - reduced to BID lately - methocarbamol, hydrocodone 10-15, tylenol - no hx of VTE  - Prostate cancer - on megestrol & got chemo injection on 3/13  Subjective:  Chief Complaint: right hip pain  HPI: Dan [name], 78 y.o. male, has a history of pain and functional disability in the right hip(s) due to  avascular necrosis  and patient has failed non-surgical conservative treatments for greater than 12 weeks to include NSAID's and/or analgesics and activity modification.  Onset of symptoms was gradual starting 1 years ago with rapidlly worsening course since that time.The patient noted no past surgery on the right hip(s).  Patient currently rates pain in the right hip at 9 out of 10 with activity. Patient has worsening of pain with activity and weight bearing, trendelenberg gait, and pain that interfers with activities of daily living. Patient has evidence of joint space narrowing by imaging studies. This condition presents safety issues increasing the risk of falls.   There is no current active infection.  Patient Active Problem List   Diagnosis Date Noted   Osteoporosis 03/26/2023   Genetic testing 03/13/2023   Malignant neoplasm of prostate metastatic to intra-abdominal lymph node (HCC) 12/31/2022   Joint pain 07/03/2021   Other specified abnormal immunological findings in serum 07/03/2021   Primary osteoarthritis 07/03/2021   Abnormal CT scan, colon 01/22/2021   History of Clostridioides difficile colitis 01/22/2021    Diarrhea 01/22/2021   Dilated pancreatic duct 01/22/2021   Loss of appetite 11/27/2020   GERD (gastroesophageal reflux disease) 11/11/2020   Hematochezia 11/11/2020   History of prostate cancer 11/11/2020   Chest wall pain 10/31/2020   Anemia 08/02/2020   Other specified personal risk factors, not elsewhere classified 07/18/2020   Body mass index (BMI) 22.0-22.9, adult 04/20/2020   PMR (polymyalgia rheumatica) (HCC) 03/29/2020   Fatigue 03/28/2020   Chronic diastolic CHF (congestive heart failure) (HCC) 03/09/2020   Atrial fibrillation (HCC) 12/15/2019   Claudication (HCC) 12/15/2019   Palpitations 12/15/2019   Cystitis 09/20/2019   Cigarette smoker 07/02/2019   Malignant neoplasm of prostate metastatic to intrapelvic lymph node (HCC) 09/23/2018   Elevated PSA 05/27/2018   Hyperlipidemia    Hypertension    COPD GOLD ? / Group E    Depression    Gout    Alcohol dependency (HCC)    ED (erectile dysfunction)    Loss of hearing    Past Medical History:  Diagnosis Date   Anemia    Arthritis    Asthma    BPH (benign prostatic hyperplasia)    COPD (chronic obstructive pulmonary disease) (HCC)    Depression    ED (erectile dysfunction)    Gout    Hyperlipidemia    Hypertension    Loss of hearing    Nocturia    Paroxysmal atrial fibrillation (HCC)    Prostate cancer [facility])     Past Surgical History:  Procedure Laterality Date   CARDIOVASCULAR STRESS TEST  12/31/2004   normal perfuction nuclear study w/ no evidence  ischemia /  normal LV function and wall motion , ef 52%   PROSTATECTOMY  2019   TOTAL HIP ARTHROPLASTY Left     Current Facility-Administered Medications  Medication Dose Route Frequency Provider Last Rate Last Admin   degarelix (FIRMAGON) injection 80 mg  80 mg Subcutaneous Q28 days Dan Pippin, MD   80 mg at 12/18/23 1013   Current Outpatient Medications  Medication Sig Dispense Refill Last Dose/Taking   abiraterone acetate (ZYTIGA) 250 MG tablet Take 4  tablets (1,000 mg total) by mouth daily. Take on an empty stomach 1 hour before or 2 hours after a meal (Patient taking differently: Take 500 mg by mouth daily. Take on an empty stomach 1 hour before or 2 hours after a meal) 120 tablet 3 Taking Differently   allopurinol (ZYLOPRIM) 100 MG tablet Take 100 mg by mouth daily.   Taking   Ascorbic Acid (VITAMIN C) 1000 MG tablet Take 1,000 mg by mouth daily.   Taking   aspirin EC 81 MG tablet Take 81 mg by mouth daily. Swallow whole.   Taking   cholecalciferol (VITAMIN D3) 25 MCG (1000 UNIT) tablet Take 1,000 Units by mouth daily.   Taking   diltiazem (CARDIZEM CD) 360 MG 24 hr capsule Take 360 mg by mouth daily.   Taking   HYDROcodone-acetaminophen (NORCO) 10-325 MG tablet Take 1 tablet by mouth every 6 (six) hours as needed. 30 tablet 0 Taking As Needed   losartan (COZAAR) 25 MG tablet TAKE 1 TABLET (25 MG TOTAL) BY MOUTH DAILY. 90 tablet 2 Taking   megestrol (MEGACE) 40 MG/ML suspension Take 400 mg by mouth daily as needed (appetite).   Taking As Needed   predniSONE (DELTASONE) 5 MG tablet Take 1 tablet (5 mg total) by mouth daily with breakfast. 90 tablet 1 Taking   TRELEGY ELLIPTA 200-62.5-25 MCG/ACT AEPB Inhale 1 puff into the lungs daily.   Taking   Zinc 20 MG CAPS Take 40 mg by mouth daily.   Taking   magnesium oxide (MAG-OX) 400 (240 Mg) MG tablet TAKE 1 TABLET BY MOUTH EVERY DAY 90 tablet 1 Not Taking   potassium chloride (MICRO-K) 10 MEQ CR capsule TAKE 2 CAPSULES BY MOUTH DAILY. 7 capsule 0 Not Taking   Allergies  Allergen Reactions   Tamsulosin Hives and Itching    Social History   Tobacco Use   Smoking status: Every Day    Current packs/day: 0.50    Average packs/day: 0.5 packs/day for 56.6 years (28.3 ttl pk-yrs)    Types: Cigarettes    Start date: 06/12/1967    Passive exposure: Never   Smokeless tobacco: Never  Substance Use Topics   Alcohol use: Not Currently    Family History  Problem Relation Age of Onset   Hypertension  Mother    Cancer Father        lung   Colon cancer Neg Hx    Inflammatory bowel disease Neg Hx    Pancreatic cancer Neg Hx      Review of Systems  Constitutional:  Negative for chills and fever.  Respiratory:  Negative for cough and shortness of breath.   Cardiovascular:  Negative for chest pain.  Gastrointestinal:  Negative for nausea and vomiting.  Musculoskeletal:  Positive for arthralgias.     Objective:  Physical Exam Well nourished and well developed. General: Alert and oriented x3, cooperative and pleasant, no acute distress.  Musculoskeletal: Right hip exam: Painful and limited hip flexion and internal rotation over  5 degrees, external rotation close to 30 degrees 5 -/5 strength with active hip flexion due to pain  Calves soft and nontender. Motor function intact in LE. Strength 5/5 LE bilaterally. Neuro: Distal pulses 2+. Sensation to light touch intact in LE.  Vital signs in last 24 hours:    Labs:   Estimated body mass index is 21.67 kg/m as calculated from the following:   Height as of 01/13/24: 5\' 10"  (1.778 m).   Weight as of 01/13/24: 68.5 kg.   Imaging Review Plain radiographs demonstrate avascular necrosis of the right femoral head.       Assessment/Plan:  End stage arthritis, right hip(s)  The patient history, physical examination, clinical judgement of the provider and imaging studies are consistent with end stage degenerative joint disease of the right hip(s) and total hip arthroplasty is deemed medically necessary. The treatment options including medical management, injection therapy, arthroscopy and arthroplasty were discussed at length. The risks and benefits of total hip arthroplasty were presented and reviewed. The risks due to aseptic loosening, infection, stiffness, dislocation/subluxation,  thromboembolic complications and other imponderables were discussed.  The patient acknowledged the explanation, agreed to proceed with the plan and  consent was signed. Patient is being admitted for inpatient treatment for surgery, pain control, PT, OT, prophylactic antibiotics, VTE prophylaxis, progressive ambulation and ADL's and discharge planning.The patient is planning to be discharged  home.   Rosalene Billings, PA-C Orthopedic Surgery [facility] [place] [phone]

## 2024-01-20 NOTE — Transfer of Care (Signed)
 Immediate Anesthesia Transfer of Care Note  Patient: Dan [name]  Procedure(s) Performed: ARTHROPLASTY, HIP, TOTAL, ANTERIOR APPROACH (Right: Hip)  Patient Location: PACU  Anesthesia Type:Spinal  Level of Consciousness: awake, alert , and oriented  Airway & Oxygen Therapy: Patient Spontanous Breathing and Patient connected to face mask oxygen  Post-op Assessment: Report given to RN and Post -op Vital signs reviewed and stable  Post vital signs: Reviewed and stable  Last Vitals:  Vitals Value Taken Time  BP 148/60 01/20/24 1405  Temp    Pulse 64 01/20/24 1413  Resp 16 01/20/24 1413  SpO2 100 % 01/20/24 1413  Vitals shown include unfiled device data.  Last Pain:  Vitals:   01/20/24 1106  TempSrc:   PainSc: 0-No pain         Complications: No notable events documented.

## 2024-01-20 NOTE — Discharge Instructions (Addendum)
 INSTRUCTIONS AFTER JOINT REPLACEMENT   Remove items at home which could result in a fall. This includes throw rugs or furniture in walking pathways ICE to the affected joint every three hours while awake for 30 minutes at a time, for at least the first 3-5 days, and then as needed for pain and swelling.  Continue to use ice for pain and swelling. You may notice swelling that will progress down to the foot and ankle.  This is normal after surgery.  Elevate your leg when you are not up walking on it.   Continue to use the breathing machine you got in the hospital (incentive spirometer) which will help keep your temperature down.  It is common for your temperature to cycle up and down following surgery, especially at night when you are not up moving around and exerting yourself.  The breathing machine keeps your lungs expanded and your temperature down.   DIET:  As you were doing prior to hospitalization, we recommend a well-balanced diet.  DRESSING / WOUND CARE / SHOWERING  Keep the surgical dressing until follow up.  The dressing is water proof, so you can shower without any extra covering.  IF THE DRESSING FALLS OFF or the wound gets wet inside, change the dressing with sterile gauze.  Please use good hand washing techniques before changing the dressing.  Do not use any lotions or creams on the incision until instructed by your surgeon.    ACTIVITY  Increase activity slowly as tolerated, but follow the weight bearing instructions below.   No driving for 6 weeks or until further direction given by your physician.  You cannot drive while taking narcotics.  No lifting or carrying greater than 10 lbs. until further directed by your surgeon. Avoid periods of inactivity such as sitting longer than an hour when not asleep. This helps prevent blood clots.  You may return to work once you are authorized by your doctor.     WEIGHT BEARING   Weight bearing as tolerated with assist device (walker, cane,  etc) as directed, use it as long as suggested by your surgeon or therapist, typically at least 4-6 weeks.   EXERCISES  Results after joint replacement surgery are often greatly improved when you follow the exercise, range of motion and muscle strengthening exercises prescribed by your doctor. Safety measures are also important to protect the joint from further injury. Any time any of these exercises cause you to have increased pain or swelling, decrease what you are doing until you are comfortable again and then slowly increase them. If you have problems or questions, call your caregiver or physical therapist for advice.   Rehabilitation is important following a joint replacement. After just a few days of immobilization, the muscles of the leg can become weakened and shrink (atrophy).  These exercises are designed to build up the tone and strength of the thigh and leg muscles and to improve motion. Often times heat used for twenty to thirty minutes before working out will loosen up your tissues and help with improving the range of motion but do not use heat for the first two weeks following surgery (sometimes heat can increase post-operative swelling).   These exercises can be done on a training (exercise) mat, on the floor, on a table or on a bed. Use whatever works the best and is most comfortable for you.    Use music or television while you are exercising so that the exercises are a pleasant break in your  day. This will make your life better with the exercises acting as a break in your routine that you can look forward to.   Perform all exercises about fifteen times, three times per day or as directed.  You should exercise both the operative leg and the other leg as well.  Exercises include:   Quad Sets - Tighten up the muscle on the front of the thigh (Quad) and hold for 5-10 seconds.   Straight Leg Raises - With your knee straight (if you were given a brace, keep it on), lift the leg to 60  degrees, hold for 3 seconds, and slowly lower the leg.  Perform this exercise against resistance later as your leg gets stronger.  Leg Slides: Lying on your back, slowly slide your foot toward your buttocks, bending your knee up off the floor (only go as far as is comfortable). Then slowly slide your foot back down until your leg is flat on the floor again.  Angel Wings: Lying on your back spread your legs to the side as far apart as you can without causing discomfort.  Hamstring Strength:  Lying on your back, push your heel against the floor with your leg straight by tightening up the muscles of your buttocks.  Repeat, but this time bend your knee to a comfortable angle, and push your heel against the floor.  You may put a pillow under the heel to make it more comfortable if necessary.   A rehabilitation program following joint replacement surgery can speed recovery and prevent re-injury in the future due to weakened muscles. Contact your doctor or a physical therapist for more information on knee rehabilitation.    CONSTIPATION  Constipation is defined medically as fewer than three stools per week and severe constipation as less than one stool per week.  Even if you have a regular bowel pattern at home, your normal regimen is likely to be disrupted due to multiple reasons following surgery.  Combination of anesthesia, postoperative narcotics, change in appetite and fluid intake all can affect your bowels.   YOU MUST use at least one of the following options; they are listed in order of increasing strength to get the job done.  They are all available over the counter, and you may need to use some, POSSIBLY even all of these options:    Drink plenty of fluids (prune juice may be helpful) and high fiber foods Colace 100 mg by mouth twice a day  Senokot for constipation as directed and as needed Dulcolax (bisacodyl), take with full glass of water  Miralax (polyethylene glycol) once or twice a day as  needed.  If you have tried all these things and are unable to have a bowel movement in the first 3-4 days after surgery call either your surgeon or your primary doctor.    If you experience loose stools or diarrhea, hold the medications until you stool forms back up.  If your symptoms do not get better within 1 week or if they get worse, check with your doctor.  If you experience "the worst abdominal pain ever" or develop nausea or vomiting, please contact the office immediately for further recommendations for treatment.   ITCHING:  If you experience itching with your medications, try taking only a single pain pill, or even half a pain pill at a time.  You can also use Benadryl over the counter for itching or also to help with sleep.   TED HOSE STOCKINGS:  Use stockings on both  legs until for at least 2 weeks or as directed by physician office. They may be removed at night for sleeping.  MEDICATIONS:  See your medication summary on the "After Visit Summary" that nursing will review with you.  You may have some home medications which will be placed on hold until you complete the course of blood thinner medication.  It is important for you to complete the blood thinner medication as prescribed.  PRECAUTIONS:  If you experience chest pain or shortness of breath - call 911 immediately for transfer to the hospital emergency department.   If you develop a fever greater that 101 F, purulent drainage from wound, increased redness or drainage from wound, foul odor from the wound/dressing, or calf pain - CONTACT YOUR SURGEON.                                                   FOLLOW-UP APPOINTMENTS:  If you do not already have a post-op appointment, please call the office for an appointment to be seen by your surgeon.  Guidelines for how soon to be seen are listed in your "After Visit Summary", but are typically between 1-4 weeks after surgery.  OTHER INSTRUCTIONS:   Knee Replacement:  Do not place pillow  under knee, focus on keeping the knee straight while resting. CPM instructions: 0-90 degrees, 2 hours in the morning, 2 hours in the afternoon, and 2 hours in the evening. Place foam block, curve side up under heel at all times except when in CPM or when walking.  DO NOT modify, tear, cut, or change the foam block in any way.  POST-OPERATIVE OPIOID TAPER INSTRUCTIONS: It is important to wean off of your opioid medication as soon as possible. If you do not need pain medication after your surgery it is ok to stop day one. Opioids include: Codeine, Hydrocodone(Norco, Vicodin), Oxycodone(Percocet, oxycontin) and hydromorphone amongst others.  Long term and even short term use of opiods can cause: Increased pain response Dependence Constipation Depression Respiratory depression And more.  Withdrawal symptoms can include Flu like symptoms Nausea, vomiting And more Techniques to manage these symptoms Hydrate well Eat regular healthy meals Stay active Use relaxation techniques(deep breathing, meditating, yoga) Do Not substitute Alcohol to help with tapering If you have been on opioids for less than two weeks and do not have pain than it is ok to stop all together.  Plan to wean off of opioids This plan should start within one week post op of your joint replacement. Maintain the same interval or time between taking each dose and first decrease the dose.  Cut the total daily intake of opioids by one tablet each day Next start to increase the time between doses. The last dose that should be eliminated is the evening dose.   MAKE SURE YOU:  Understand these instructions.  Get help right away if you are not doing well or get worse.    Thank you for letting us be a part of your medical care team.  It is a privilege we respect greatly.  We hope these instructions will help you stay on track for a fast and full recovery!     Information on my medicine - XARELTO (Rivaroxaban)  Why was  Xarelto prescribed for you? Xarelto was prescribed for you to reduce the risk of blood clots forming after  orthopedic surgery. The medical term for these abnormal blood clots is venous thromboembolism (VTE).  What do you need to know about xarelto ? Take your Xarelto ONCE DAILY at the same time every day. You may take it either with or without food.  If you have difficulty swallowing the tablet whole, you may crush it and mix in applesauce just prior to taking your dose.  Take Xarelto exactly as prescribed by your doctor and DO NOT stop taking Xarelto without talking to the doctor who prescribed the medication.  Stopping without other VTE prevention medication to take the place of Xarelto may increase your risk of developing a clot.  After discharge, you should have regular check-up appointments with your healthcare provider that is prescribing your Xarelto.    What do you do if you miss a dose? If you miss a dose, take it as soon as you remember on the same day then continue your regularly scheduled once daily regimen the next day. Do not take two doses of Xarelto on the same day.   Important Safety Information A possible side effect of Xarelto is bleeding. You should call your healthcare provider right away if you experience any of the following: Bleeding from an injury or your nose that does not stop. Unusual colored urine (red or dark brown) or unusual colored stools (red or black). Unusual bruising for unknown reasons. A serious fall or if you hit your head (even if there is no bleeding).  Some medicines may interact with Xarelto and might increase your risk of bleeding while on Xarelto. To help avoid this, consult your healthcare provider or pharmacist prior to using any new prescription or non-prescription medications, including herbals, vitamins, non-steroidal anti-inflammatory drugs (NSAIDs) and supplements.  This website has more information on Xarelto:  [email or web address].

## 2024-01-20 NOTE — Anesthesia Procedure Notes (Signed)
 Spinal  Patient location during procedure: OR Start time: 01/20/2024 12:37 PM End time: 01/20/2024 12:39 PM Staffing Performed: anesthesiologist  Anesthesiologist: Atilano Median, DO Performed by: Atilano Median, DO Authorized by: Atilano Median, DO   Preanesthetic Checklist Completed: patient identified, IV checked, site marked, risks and benefits discussed, surgical consent, monitors and equipment checked, pre-op evaluation and timeout performed Spinal Block Patient position: sitting Prep: DuraPrep Patient monitoring: heart rate, cardiac monitor, continuous pulse ox and blood pressure Approach: midline Location: L4-5 Injection technique: single-shot Needle Needle type: Pencan  Needle gauge: 24 G Needle length: 10 cm Assessment Events: CSF return Additional Notes Patient identified. Risks/Benefits/Options discussed with patient including but not limited to bleeding, infection, nerve damage, paralysis, failed block, incomplete pain control, headache, blood pressure changes, nausea, vomiting, reactions to medications, itching and postpartum back pain. Confirmed with bedside nurse the patient's most recent platelet count. Confirmed with patient that they are not currently taking any anticoagulation, have any bleeding history or any family history of bleeding disorders. Patient expressed understanding and wished to proceed. All questions were answered. Sterile technique was used throughout the entire procedure. Please see nursing notes for vital signs. Warning signs of high block given to the patient including shortness of breath, tingling/numbness in hands, complete motor block, or any concerning symptoms with instructions to call for help. Patient was given instructions on fall risk and not to get out of bed. All questions and concerns addressed with instructions to call with any issues or inadequate analgesia.

## 2024-01-20 NOTE — Progress Notes (Signed)
 Prior-To-Admission Oral Chemotherapy for Treatment of Oncologic Disease   Order noted from Dr. Charlann Boxer to continue prior-to-admission oral chemotherapy regimen of abiraterone acetate (Zytiga).  Procedure Per Pharmacy & Therapeutics Committee Policy: Orders for continuation of home oral chemotherapy for treatment of an oncologic disease will be held unless approved by an oncologist during current admission.    For patients receiving oncology care at [facility], inpatient pharmacist contacts patient's oncologist during regular office hours to review. If earlier review is medically necessary, attending physician consults [facility] on-call oncologist  Oral chemotherapy continuation order is on hold pending oncologist review, [facility] oncologist Doreatha Massed will be notified by inpatient pharmacy during office hours    Cherylin Mylar, PharmD Clinical Pharmacist  3/18/20255:31 PM

## 2024-01-20 NOTE — Anesthesia Procedure Notes (Addendum)
 Procedure Name: MAC Date/Time: 01/20/2024 12:34 PM  Performed by: Maurene Capes, CRNAPre-anesthesia Checklist: Patient identified, Emergency Drugs available, Suction available, Patient being monitored and Timeout performed Patient Re-evaluated:Patient Re-evaluated prior to induction Oxygen Delivery Method: Simple face mask Preoxygenation: Pre-oxygenation with 100% oxygen Induction Type: IV induction Placement Confirmation: positive ETCO2 Dental Injury: Teeth and Oropharynx as per pre-operative assessment

## 2024-01-20 NOTE — Op Note (Signed)
 NAME:  Dan [name]                ACCOUNT NO.: [id number]      MEDICAL RECORD NO.: [id number]      FACILITY:  [facility]      PHYSICIAN:  Shelda Pal  DATE OF BIRTH:  09/14/1946     DATE OF PROCEDURE:  01/20/2024                                 OPERATIVE REPORT         PREOPERATIVE DIAGNOSIS: Right  hip osteoarthritis.      POSTOPERATIVE DIAGNOSIS:  Right hip osteoarthritis.      PROCEDURE:  Right total hip replacement through an anterior approach   utilizing DePuy THR system, component size 56 mm pinnacle cup, a size 36+4 neutral   Altrex liner, a size 9 Hi Actis stem with a 36+5 Articuleze metal head ball.      SURGEON:  Madlyn Frankel. Charlann Boxer, M.D.      ASSISTANT:  Rosalene Billings, PA-C     ANESTHESIA:  Spinal.      SPECIMENS:  None.      COMPLICATIONS:  None.      BLOOD LOSS:  200 cc     DRAINS:  None.      INDICATION OF THE PROCEDURE:  Dan [name] is a 78 y.o. male who had   presented to office for evaluation of right hip pain.  Radiographs revealed   progressive degenerative changes with bone-on-bone   articulation of the  hip joint, including subchondral cystic changes and osteophytes.  The patient had painful limited range of   motion significantly affecting their overall quality of life and function.  The patient was failing to    respond to conservative measures including medications and/or injections and activity modification and at this point was ready   to proceed with more definitive measures.  Consent was obtained for   benefit of pain relief.  Specific risks of infection, DVT, component   failure, dislocation, neurovascular injury, and need for revision surgery were reviewed in the office.     PROCEDURE IN DETAIL:  The patient was brought to operative theater.   Once adequate anesthesia, preoperative antibiotics, 2 gm of Ancef, 1 gm of Tranexamic Acid, and 10 mg of Decadron were administered, the patient was positioned supine on the [employer] table.  Once the patient was safely positioned with adequate padding of boney prominences we predraped out the hip, and used fluoroscopy to confirm orientation of the pelvis.      The right hip was then prepped and draped from proximal iliac crest to   mid thigh with a shower curtain technique.      Time-out was performed identifying the patient, planned procedure, and the appropriate extremity.     An incision was then made 2 cm lateral to the   anterior superior iliac spine extending over the orientation of the   tensor fascia lata muscle and sharp dissection was carried down to the   fascia of the muscle.      The fascia was then incised.  The muscle belly was identified and swept   laterally and retractor placed along the superior neck.  Following   cauterization of the circumflex vessels and removing some pericapsular   fat, a second cobra retractor was placed on the inferior neck.  A T-capsulotomy was made along the line of the   superior neck to the trochanteric fossa, then extended proximally and   distally.  Tag sutures were placed and the retractors were then placed   intracapsular.  We then identified the trochanteric fossa and   orientation of my neck cut and then made a neck osteotomy with the femur on traction.  The femoral   head was removed without difficulty or complication.  Traction was let   off and retractors were placed posterior and anterior around the   acetabulum.      The labrum and foveal tissue were debrided.  I began reaming with a 51 mm   reamer and reamed up to 55 mm reamer with good bony bed preparation and a 56 mm  cup was chosen.  The final 56 mm Pinnacle cup was then impacted under fluoroscopy to confirm the depth of penetration and orientation with respect to   Abduction and forward flexion.  A screw was placed into the ilium followed by the hole eliminator.  The final   36+4 neutral Altrex liner was impacted with good visualized rim fit.  The cup  was positioned anatomically within the acetabular portion of the pelvis.      At this point, the femur was rolled to 100 degrees.  Further capsule was   released off the inferior aspect of the femoral neck.  I then   released the superior capsule proximally.  With the leg in a neutral position the hook was placed laterally   along the femur under the vastus lateralis origin and elevated manually and then held in position using the hook attachment on the bed.  The leg was then extended and adducted with the leg rolled to 100   degrees of external rotation.  Retractors were placed along the medial calcar and posteriorly over the greater trochanter.  Once the proximal femur was fully   exposed, I used a box osteotome to set orientation.  I then began   broaching with the starting chili pepper broach and passed this by hand and then broached up to 9.  With the 9 broach in place I chose a high offset neck and did several trial reductions.  The offset was appropriate, leg lengths   appeared to be equal best matched with the +5 head ball trial confirmed radiographically.   Given these findings, I went ahead and dislocated the hip, repositioned all   retractors and positioned the right hip in the extended and abducted position.  The final 9 Hi Actis stem was   chosen and it was impacted down to the level of neck cut.  Based on this   and the trial reductions, a final 36+5 Articuleze metal head ball was chosen and   impacted onto a clean and dry trunnion, and the hip was reduced.  The   hip had been irrigated throughout the case again at this point.  I did   reapproximate the superior capsular leaflet to the anterior leaflet   using #1 Vicryl.  The fascia of the   tensor fascia lata muscle was then reapproximated using #1 Vicryl and #0 Stratafix sutures.  The   remaining wound was closed with 2-0 Vicryl and running 4-0 Monocryl.   The hip was cleaned, dried, and dressed sterilely using Dermabond and    Aquacel dressing.  The patient was then brought   to recovery room in stable condition tolerating the procedure well.    Rosalene Billings,  PA-C was present for the entirety of the case involved from   preoperative positioning, perioperative retractor management, general   facilitation of the case, as well as primary wound closure as assistant.            Madlyn Frankel Charlann Boxer, M.D.        01/20/2024 11:07 AM

## 2024-01-20 NOTE — Interval H&P Note (Signed)
 History and Physical Interval Note:  01/20/2024 11:07 AM  Dan [name]  has presented today for surgery, with the diagnosis of Right hip avascular necrosis.  The various methods of treatment have been discussed with the patient and family. After consideration of risks, benefits and other options for treatment, the patient has consented to  Procedure(s): ARTHROPLASTY, HIP, TOTAL, ANTERIOR APPROACH (Right) as a surgical intervention.  The patient's history has been reviewed, patient examined, no change in status, stable for surgery.  I have reviewed the patient's chart and labs.  Questions were answered to the patient's satisfaction.     Shelda Pal

## 2024-01-21 ENCOUNTER — Encounter (HOSPITAL_COMMUNITY): Payer: Self-pay | Admitting: Orthopedic Surgery

## 2024-01-21 DIAGNOSIS — M1611 Unilateral primary osteoarthritis, right hip: Secondary | ICD-10-CM | POA: Diagnosis not present

## 2024-01-21 LAB — CBC
HCT: 27.4 % — ABNORMAL LOW (ref 39.0–52.0)
Hemoglobin: 8.9 g/dL — ABNORMAL LOW (ref 13.0–17.0)
MCH: 29.4 pg (ref 26.0–34.0)
MCHC: 32.5 g/dL (ref 30.0–36.0)
MCV: 90.4 fL (ref 80.0–100.0)
Platelets: 327 10*3/uL (ref 150–400)
RBC: 3.03 MIL/uL — ABNORMAL LOW (ref 4.22–5.81)
RDW: 16.6 % — ABNORMAL HIGH (ref 11.5–15.5)
WBC: 21 10*3/uL — ABNORMAL HIGH (ref 4.0–10.5)
nRBC: 0 % (ref 0.0–0.2)

## 2024-01-21 LAB — BASIC METABOLIC PANEL
Anion gap: 8 (ref 5–15)
BUN: 37 mg/dL — ABNORMAL HIGH (ref 8–23)
CO2: 20 mmol/L — ABNORMAL LOW (ref 22–32)
Calcium: 10.3 mg/dL (ref 8.9–10.3)
Chloride: 106 mmol/L (ref 98–111)
Creatinine, Ser: 1.25 mg/dL — ABNORMAL HIGH (ref 0.61–1.24)
GFR, Estimated: 60 mL/min — ABNORMAL LOW (ref >60)
Glucose, Bld: 134 mg/dL — ABNORMAL HIGH (ref 70–99)
Potassium: 4.4 mmol/L (ref 3.5–5.1)
Sodium: 134 mmol/L — ABNORMAL LOW (ref 135–145)

## 2024-01-21 MED ORDER — SENNA 8.6 MG PO TABS: 2.0000 | ORAL_TABLET | Freq: Every day | ORAL | 0 refills | Status: AC

## 2024-01-21 MED ORDER — METHOCARBAMOL 500 MG PO TABS: 500.0000 mg | ORAL_TABLET | Freq: Four times a day (QID) | ORAL | 2 refills | Status: DC | PRN

## 2024-01-21 MED ORDER — RIVAROXABAN 10 MG PO TABS: 10.0000 mg | ORAL_TABLET | Freq: Every day | ORAL | 0 refills | Status: DC

## 2024-01-21 MED ORDER — POLYETHYLENE GLYCOL 3350 17 G PO PACK: 17.0000 g | PACK | Freq: Two times a day (BID) | ORAL | 0 refills | Status: AC

## 2024-01-21 NOTE — Anesthesia Postprocedure Evaluation (Signed)
 Anesthesia Post Note  Patient: Damontae Loppnow Cimo  Procedure(s) Performed: ARTHROPLASTY, HIP, TOTAL, ANTERIOR APPROACH (Right: Hip)     Patient location during evaluation: PACU Anesthesia Type: MAC and Spinal Level of consciousness: awake and alert Pain management: pain level controlled Vital Signs Assessment: post-procedure vital signs reviewed and stable Respiratory status: spontaneous breathing, nonlabored ventilation, respiratory function stable and patient connected to nasal cannula oxygen Cardiovascular status: stable and blood pressure returned to baseline Postop Assessment: no apparent nausea or vomiting Anesthetic complications: no   No notable events documented.  Last Vitals:  Vitals:   01/21/24 0900 01/21/24 0945  BP:  (!) 155/68  Pulse:  88  Resp:  18  Temp:  36.8 C  SpO2: 92% 99%    Last Pain:  Vitals:   01/21/24 0945  TempSrc:   PainSc: 0-No pain                 Earl Lites P Daran Favaro

## 2024-01-21 NOTE — Progress Notes (Signed)
 Pt has DC order, cleared by PT, no need per CM. AVS was explained to pt and wife, all questions has been answered.

## 2024-01-21 NOTE — Evaluation (Signed)
 Physical Therapy Evaluation Patient Details Name: Dan [name] MRN: [id number] DOB: [birth year] Today's Date: 01/21/2024  History of Present Illness  Pt is s/p R THR anterior approach secondary to avascular necrosis. PMH includes: Osteoporosis, malignant neoplasm of prostate, OA, CHF, a-fib, HTN, Gout, COPD, Left hip hemiarthroplasty  Clinical Impression  Pt presents with dependencies in mobility secondary to the above diagnosis. Pt reports he will d/c home today. Pt will have assistance from his spouse upon d/c. Pt was able to ambulate with supervision 55" with RW. Pt's activity tolerance is limited secondary to dyspnea and general deconditioning. Pt would benefit from a second session later this morning for step training, review of HEP and then will be ready for d/c home.         If plan is discharge home, recommend the following: Help with stairs or ramp for entrance;Assist for transportation;Assistance with cooking/housework;A little help with walking and/or transfers   Can travel by private vehicle        Equipment Recommendations None recommended by PT  Recommendations for Other Services       Functional Status Assessment Patient has had a recent decline in their functional status and demonstrates the ability to make significant improvements in function in a reasonable and predictable amount of time.     Precautions / Restrictions Precautions Precautions: Anterior Hip Restrictions Weight Bearing Restrictions Per Provider Order: No      Mobility  Bed Mobility Overal bed mobility: Needs Assistance Bed Mobility: Supine to Sit     Supine to sit: Supervision, HOB elevated, Used rails       Patient Response: Cooperative  Transfers Overall transfer level: Needs assistance Equipment used: Rolling walker (2 wheels) Transfers: Sit to/from Stand Sit to Stand: Supervision           General transfer comment: cues for hand placement for increased safety     Ambulation/Gait Ambulation/Gait assistance: Supervision Gait Distance (Feet): 55 Feet Assistive device: Rolling walker (2 wheels) Gait Pattern/deviations: Step-through pattern, Decreased stride length Gait velocity: decreased     General Gait Details: increased SOB with gait  Stairs            Wheelchair Mobility     Tilt Bed Tilt Bed Patient Response: Cooperative  Modified Rankin (Stroke Patients Only)       Balance Overall balance assessment: Mild deficits observed, not formally tested                                           Pertinent Vitals/Pain Pain Assessment Pain Assessment: 0-10 Pain Score: 4  Pain Location: R hip Pain Descriptors / Indicators: Discomfort Pain Intervention(s): Limited activity within patient's tolerance, Monitored during session    Home Living Family/patient expects to be discharged to:: Private residence Living Arrangements: Spouse/significant other Available Help at Discharge: Family Type of Home: House Home Access: Stairs to enter Entrance Stairs-Rails: Can reach both;Left;Right Entrance Stairs-Number of Steps: 2   Home Layout: One level Home Equipment: Agricultural consultant (2 wheels);Rollator (4 wheels);Cane - single point      Prior Function Prior Level of Function : Independent/Modified Independent                     Extremity/Trunk Assessment   Upper Extremity Assessment Upper Extremity Assessment: Defer to OT evaluation    Lower Extremity Assessment Lower Extremity Assessment: Generalized weakness  Communication   Communication Communication: Other (comment) Factors Affecting Communication: Hearing impaired    Cognition Arousal: Alert Behavior During Therapy: WFL for tasks assessed/performed   PT - Cognitive impairments: No apparent impairments                         Following commands: Intact       Cueing Cueing Techniques: Verbal cues     General Comments  General comments (skin integrity, edema, etc.): muscle atrophy, increased rigidity, c/o back pain with LE therex due to hamstring tightness    Exercises Total Joint Exercises Ankle Circles/Pumps: AROM, Strengthening, Both, 10 reps, Seated Quad Sets: AROM, Strengthening, Both, 10 reps, Seated Heel Slides: AROM, Strengthening, Right, 10 reps, Seated Hip ABduction/ADduction: AROM, Strengthening, Right, 10 reps, Seated Long Arc Quad: AROM, Strengthening, Right, 10 reps, Seated   Assessment/Plan    PT Assessment Patient needs continued PT services  PT Problem List Decreased range of motion;Decreased mobility;Cardiopulmonary status limiting activity;Decreased activity tolerance;Decreased safety awareness       PT Treatment Interventions DME instruction;Functional mobility training;Balance training;Patient/family education;Gait training;Therapeutic activities;Stair training;Therapeutic exercise    PT Goals (Current goals can be found in the Care Plan section)  Acute Rehab PT Goals Patient Stated Goal: to d/c home today PT Goal Formulation: With patient Time For Goal Achievement: 01/28/24 Potential to Achieve Goals: Good    Frequency 7X/week     Co-evaluation               AM-PAC PT "6 Clicks" Mobility  Outcome Measure Help needed turning from your back to your side while in a flat bed without using bedrails?: None Help needed moving from lying on your back to sitting on the side of a flat bed without using bedrails?: A Little Help needed moving to and from a bed to a chair (including a wheelchair)?: A Little Help needed standing up from a chair using your arms (e.g., wheelchair or bedside chair)?: A Little Help needed to walk in hospital room?: A Little Help needed climbing 3-5 steps with a railing? : A Little 6 Click Score: 19    End of Session Equipment Utilized During Treatment: Gait belt Activity Tolerance: Patient tolerated treatment well Patient left: in chair;with  call bell/phone within reach Nurse Communication: Mobility status PT Visit Diagnosis: Muscle weakness (generalized) (M62.81);Difficulty in walking, not elsewhere classified (R26.2)    Time: 3016-0109 PT Time Calculation (min) (ACUTE ONLY): 32 min   Charges:   PT Evaluation $PT Eval Low Complexity: 1 Low PT Treatments $Gait Training: 8-22 mins PT General Charges $$ ACUTE PT VISIT: 1 Visit         Greggory Stallion 01/21/2024, 9:39 AM

## 2024-01-21 NOTE — Progress Notes (Signed)
 Physical Therapy Treatment Patient Details Name: Dan [name] MRN: [id number] DOB: 1946-03-07 Today's Date: 01/21/2024   History of Present Illness Pt is s/p R THR anterior approach secondary to avascular necrosis. PMH includes: Osteoporosis, malignant neoplasm of prostate, OA, CHF, a-fib, HTN, Gout, COPD, Left hip hemiarthroplasty    PT Comments  Pt was very eager to d/c home this morning. Pt did complete step training with his family and demonstrated a safe technique. Pt is currently supervision with mobility and his family was agreeable to provide assistance as needed. Pt instructed in a HEP and given a handout. Pt is ready for d/c home and agrees to follow HEP.    If plan is discharge home, recommend the following: Help with stairs or ramp for entrance;Assist for transportation;Assistance with cooking/housework;A little help with walking and/or transfers   Can travel by private vehicle        Equipment Recommendations  None recommended by PT    Recommendations for Other Services       Precautions / Restrictions Precautions Precautions: Anterior Hip Restrictions Weight Bearing Restrictions Per Provider Order: No     Mobility  Bed Mobility Overal bed mobility: Needs Assistance Bed Mobility: Supine to Sit     Supine to sit: Supervision, HOB elevated, Used rails       Patient Response: Restless  Transfers Overall transfer level: Needs assistance Equipment used: Rolling walker (2 wheels) Transfers: Sit to/from Stand Sit to Stand: Supervision           General transfer comment: cues for hand placement for increased safety    Ambulation/Gait Ambulation/Gait assistance: Supervision Gait Distance (Feet): 25 Feet Assistive device: Rolling walker (2 wheels) Gait Pattern/deviations: Step-through pattern, Decreased stride length Gait velocity: decreased     General Gait Details: increased SOB with gait, Pr eager to d/c home and agreeable to walk to practice steps  and then go home.   Stairs Stairs: Yes Stairs assistance: Contact guard assist Stair Management: Two rails, Step to pattern, Forwards Number of Stairs: 3     Wheelchair Mobility     Tilt Bed Tilt Bed Patient Response: Restless  Modified Rankin (Stroke Patients Only)       Balance Overall balance assessment: Mild deficits observed, not formally tested                                          Communication Communication Communication: Other (comment) Factors Affecting Communication: Hearing impaired  Cognition Arousal: Alert Behavior During Therapy: WFL for tasks assessed/performed   PT - Cognitive impairments: No apparent impairments                         Following commands: Intact      Cueing Cueing Techniques: Verbal cues  Exercises Total Joint Exercises Ankle Circles/Pumps: AROM, Strengthening, Both, 10 reps, Seated Quad Sets: AROM, Strengthening, Both, 10 reps, Seated Heel Slides: AROM, Strengthening, Right, 10 reps, Seated Hip ABduction/ADduction: AROM, Strengthening, Right, 10 reps, Seated Long Arc Quad: AROM, Strengthening, Right, 10 reps, Seated    General Comments General comments (skin integrity, edema, etc.): Pt was eager to d.c home. Reviewed steps and HEP with pt/family.      Pertinent Vitals/Pain Pain Assessment Pain Assessment: 0-10 Pain Score: 4  Pain Location: R hip Pain Descriptors / Indicators: Discomfort Pain Intervention(s): Limited activity within patient's tolerance, Monitored during session  Home Living Family/patient expects to be discharged to:: Private residence Living Arrangements: Spouse/significant other Available Help at Discharge: Family Type of Home: House Home Access: Stairs to enter Entrance Stairs-Rails: Can reach both;Left;Right Entrance Stairs-Number of Steps: 2   Home Layout: One level Home Equipment: Agricultural consultant (2 wheels);Rollator (4 wheels);Cane - single point       Prior Function            PT Goals (current goals can now be found in the care plan section) Acute Rehab PT Goals Patient Stated Goal: to d/c home today PT Goal Formulation: With patient Time For Goal Achievement: 01/28/24 Potential to Achieve Goals: Good Progress towards PT goals: Progressing toward goals    Frequency    7X/week      PT Plan      Co-evaluation              AM-PAC PT "6 Clicks" Mobility   Outcome Measure  Help needed turning from your back to your side while in a flat bed without using bedrails?: None Help needed moving from lying on your back to sitting on the side of a flat bed without using bedrails?: A Little Help needed moving to and from a bed to a chair (including a wheelchair)?: A Little Help needed standing up from a chair using your arms (e.g., wheelchair or bedside chair)?: None Help needed to walk in hospital room?: A Little Help needed climbing 3-5 steps with a railing? : A Little 6 Click Score: 20    End of Session Equipment Utilized During Treatment: Gait belt Activity Tolerance: Patient tolerated treatment well Patient left: in chair;with call bell/phone within reach Nurse Communication: Mobility status PT Visit Diagnosis: Muscle weakness (generalized) (M62.81);Difficulty in walking, not elsewhere classified (R26.2)     Time: 0865-7846 PT Time Calculation (min) (ACUTE ONLY): 19 min  Charges:    $Gait Training: 8-22 mins $Therapeutic Activity: 8-22 mins PT General Charges $$ ACUTE PT VISIT: 1 Visit                       Greggory Stallion 01/21/2024, 11:35 AM

## 2024-01-21 NOTE — Plan of Care (Signed)
  Problem: Education: Goal: Knowledge of General Education information will improve Description: Including pain rating scale, medication(s)/side effects and non-pharmacologic comfort measures Outcome: Progressing   Problem: Clinical Measurements: Goal: Ability to maintain clinical measurements within normal limits will improve Outcome: Progressing   Problem: Safety: Goal: Ability to remain free from injury will improve Outcome: Progressing   Problem: Activity: Goal: Risk for activity intolerance will decrease Outcome: Progressing   Problem: Pain Managment: Goal: General experience of comfort will improve and/or be controlled Outcome: Progressing

## 2024-01-21 NOTE — TOC Transition Note (Signed)
 Transition of Care [facility]) - Discharge Note   Patient Details  Name: Dan [name] MRN: [id number] Date of Birth: 04-11-1946  [facility] CM/SW Contact:  Amada Jupiter, LCSW Phone Number: 01/21/2024, 10:26 AM   Clinical Narrative:    Met with pt and spouse who confirm he has needed DME in the home.  Plan for HEP.  No further TOC needs.   Final next level of care: Home/Self Care Barriers to Discharge: No Barriers Identified   Patient Goals and CMS Choice Patient states their goals for this hospitalization and ongoing recovery are:: return home          Discharge Placement                       Discharge Plan and Services Additional resources added to the After Visit Summary for                  DME Arranged: N/A DME Agency: NA                  Social Drivers of Health (SDOH) Interventions SDOH Screenings   Food Insecurity: No Food Insecurity (01/20/2024)  Housing: Low Risk  (01/20/2024)  Transportation Needs: No Transportation Needs (01/20/2024)  Utilities: Not At Risk (01/20/2024)  Depression (PHQ2-9): Low Risk  (01/14/2023)  Social Connections: Unknown (01/20/2024)  Tobacco Use: High Risk (01/20/2024)     Readmission Risk Interventions     No data to display

## 2024-01-21 NOTE — Care Management Obs Status (Signed)
 MEDICARE OBSERVATION STATUS NOTIFICATION   Patient Details  Name: Dan [name] MRN: [id number] Date of Birth: 1946-01-12   Medicare Observation Status Notification Given:  Yes    Amada Jupiter, LCSW 01/21/2024, 10:30 AM

## 2024-01-21 NOTE — Progress Notes (Addendum)
   Subjective: 1 Day Post-Op Procedure(s) (LRB): ARTHROPLASTY, HIP, TOTAL, ANTERIOR APPROACH (Right) Patient reports pain as mild.   Patient seen in rounds with Dr. Charlann Boxer. Patient is resting in bed on exam this morning. He is moving his hip actively in bed without issue. No acute events overnight. Patient has not been up with PT yet.  We will start therapy today.   Objective: Vital signs in last 24 hours: Temp:  [97.8 F (36.6 C)-98.6 F (37 C)] 98.6 F (37 C) (03/19 0541) Pulse Rate:  [53-82] 70 (03/19 0541) Resp:  [14-23] 17 (03/19 0541) BP: (142-179)/(59-86) 159/86 (03/19 0541) SpO2:  [92 %-100 %] 98 % (03/19 0541) Weight:  [68.5 kg] 68.5 kg (03/18 1106)  Intake/Output from previous day:  Intake/Output Summary (Last 24 hours) at 01/21/2024 0756 Last data filed at 01/21/2024 1610 Gross per 24 hour  Intake 1790.5 ml  Output 2025 ml  Net -234.5 ml     Intake/Output this shift: No intake/output data recorded.  Labs: Recent Labs    01/21/24 0341  HGB 8.9*   Recent Labs    01/21/24 0341  WBC 21.0*  RBC 3.03*  HCT 27.4*  PLT 327   Recent Labs    01/21/24 0341  NA 134*  K 4.4  CL 106  CO2 20*  BUN 37*  CREATININE 1.25*  GLUCOSE 134*  CALCIUM 10.3   No results for input(s): "LABPT", "INR" in the last 72 hours.  Exam: General - Patient is Alert and Oriented Extremity - Neurologically intact Sensation intact distally Intact pulses distally Dorsiflexion/Plantar flexion intact Dressing - dressing C/D/I Motor Function - intact, moving foot and toes well on exam.   Past Medical History:  Diagnosis Date   Anemia    Arthritis    Asthma    BPH (benign prostatic hyperplasia)    COPD (chronic obstructive pulmonary disease) (HCC)    Depression    ED (erectile dysfunction)    Gout    Hyperlipidemia    Hypertension    Loss of hearing    Nocturia    Paroxysmal atrial fibrillation (HCC)    Prostate cancer (HCC)     Assessment/Plan: 1 Day Post-Op  Procedure(s) (LRB): ARTHROPLASTY, HIP, TOTAL, ANTERIOR APPROACH (Right) Principal Problem:   S/P total right hip arthroplasty  Estimated body mass index is 21.67 kg/m as calculated from the following:   Height as of this encounter: 5\' 10"  (1.778 m).   Weight as of this encounter: 68.5 kg. Advance diet Up with therapy D/C IV fluids  DVT Prophylaxis - Xarelto Weight bearing as tolerated.  Hgb stable at 8.9 this AM. If they do not continue his home chemo while inpatient, he can take when home today.  He got a 30 day supply of Hydrocodone on 3/4 - should have enough to use at home.   Plan is to go Home after hospital stay. Plan for discharge today after meeting goals with therapy. Follow up in the office in 2 weeks.   Rosalene Billings, PA-C Orthopedic Surgery [phone] 01/21/2024, 7:56 AM

## 2024-01-26 ENCOUNTER — Inpatient Hospital Stay: Payer: HMO | Admitting: Hematology

## 2024-01-29 ENCOUNTER — Other Ambulatory Visit: Payer: Self-pay

## 2024-01-29 ENCOUNTER — Other Ambulatory Visit: Payer: Self-pay | Admitting: Pharmacy Technician

## 2024-01-29 ENCOUNTER — Other Ambulatory Visit (HOSPITAL_COMMUNITY): Payer: Self-pay

## 2024-01-29 NOTE — Progress Notes (Signed)
 Specialty Pharmacy Refill Coordination Note  ELIZEO RODRIQUES is a 78 y.o. male contacted today regarding refills of specialty medication(s) Abiraterone Acetate (ZYTIGA)   Patient requested (Patient-Rptd) Delivery   Delivery date: 02/02/24   Verified address: (Patient-Rptd) [address], [place], [place] [address]   Medication will be filled on 01/30/2024.

## 2024-01-29 NOTE — Progress Notes (Signed)
 Clinical Intervention Note  Clinical Intervention Notes: Patient reported on his questionaire,that he recently started Xarelto, there is a category X DDI with Zytiga, as Zytiga can increase the concentration Xarelto.  Called to counsel and discuss this interaction with the patient, had to LVM.  Will try the patient again next week and inform the clinical pharmacist.   Clinical Intervention Outcomes: Prevention of an adverse drug event   Dan [name] Specialty Pharmacist

## 2024-01-30 ENCOUNTER — Other Ambulatory Visit: Payer: Self-pay

## 2024-01-30 NOTE — Progress Notes (Signed)
 Clinical Intervention Note  Clinical Intervention Notes: Per RPh Alyson the DDI originally identified between Xarelto and Roosvelt Maser is not valid and no treatment changes are required.   Clinical Intervention Outcomes: Prevention of an adverse drug event   Otto Herb Specialty Pharmacist

## 2024-02-02 ENCOUNTER — Other Ambulatory Visit (HOSPITAL_COMMUNITY): Payer: Self-pay

## 2024-02-02 ENCOUNTER — Other Ambulatory Visit: Payer: Self-pay | Admitting: Cardiology

## 2024-02-03 NOTE — Discharge Summary (Signed)
 Patient ID: Dan [name] MRN: [id number] DOB/AGE: 1946/05/14 78 y.o.  Admit date: 01/20/2024 Discharge date: 01/21/2024  Admission Diagnoses:  Right hip osteoarthritis  Discharge Diagnoses:  Principal Problem:   S/P total right hip arthroplasty   Past Medical History:  Diagnosis Date   Anemia    Arthritis    Asthma    BPH (benign prostatic hyperplasia)    COPD (chronic obstructive pulmonary disease) (HCC)    Depression    ED (erectile dysfunction)    Gout    Hyperlipidemia    Hypertension    Loss of hearing    Nocturia    Paroxysmal atrial fibrillation (HCC)    Prostate cancer (HCC)     Surgeries: Procedure(s): ARTHROPLASTY, HIP, TOTAL, ANTERIOR APPROACH on 01/20/2024   Consultants:   Discharged Condition: Improved  Hospital Course: Dan [name] is an 78 y.o. male who was admitted 01/20/2024 for operative treatment ofS/P total right hip arthroplasty. Patient has severe unremitting pain that affects sleep, daily activities, and work/hobbies. After pre-op clearance the patient was taken to the operating room on 01/20/2024 and underwent  Procedure(s): ARTHROPLASTY, HIP, TOTAL, ANTERIOR APPROACH.    Patient was given perioperative antibiotics:  Anti-infectives (From admission, onward)    Start     Dose/Rate Route Frequency Ordered Stop   01/20/24 1830  ceFAZolin (ANCEF) IVPB 2g/100 mL premix        2 g 200 mL/hr over 30 Minutes Intravenous Every 6 hours 01/20/24 1718 01/21/24 0202   01/20/24 1115  ceFAZolin (ANCEF) IVPB 2g/100 mL premix        2 g 200 mL/hr over 30 Minutes Intravenous On call to O.R. 01/20/24 1108 01/20/24 1238   01/20/24 1114  ceFAZolin (ANCEF) 2-4 GM/100ML-% IVPB       Note to Pharmacy: Linard Millers: cabinet override      01/20/24 1114 01/20/24 1238        Patient was given sequential compression devices, early ambulation, and chemoprophylaxis to prevent DVT. Patient worked with PT and was meeting their goals regarding safe ambulation and  transfers.  Patient benefited maximally from hospital stay and there were no complications.    Recent vital signs: No data found.   Recent laboratory studies: No results for input(s): "WBC", "HGB", "HCT", "PLT", "NA", "K", "CL", "CO2", "BUN", "CREATININE", "GLUCOSE", "INR", "CALCIUM" in the last 72 hours.  Invalid input(s): "PT", "2"   Discharge Medications:   Allergies as of 01/21/2024       Reactions   Tamsulosin Hives, Itching        Medication List     STOP taking these medications    aspirin EC 81 MG tablet       TAKE these medications    abiraterone acetate 250 MG tablet Commonly known as: ZYTIGA Take 4 tablets (1,000 mg total) by mouth daily. Take on an empty stomach 1 hour before or 2 hours after a meal What changed: how much to take   allopurinol 100 MG tablet Commonly known as: ZYLOPRIM Take 100 mg by mouth daily.   cholecalciferol 25 MCG (1000 UNIT) tablet Commonly known as: VITAMIN D3 Take 1,000 Units by mouth daily.   diltiazem 360 MG 24 hr capsule Commonly known as: CARDIZEM CD Take 360 mg by mouth daily.   HYDROcodone-acetaminophen 10-325 MG tablet Commonly known as: NORCO Take 1 tablet by mouth every 6 (six) hours as needed.   magnesium oxide 400 (240 Mg) MG tablet Commonly known as: MAG-OX TAKE 1 TABLET BY MOUTH EVERY  DAY   megestrol 40 MG/ML suspension Commonly known as: MEGACE Take 400 mg by mouth daily as needed (appetite).   methocarbamol 500 MG tablet Commonly known as: ROBAXIN Take 1 tablet (500 mg total) by mouth every 6 (six) hours as needed for muscle spasms.   polyethylene glycol 17 g packet Commonly known as: MIRALAX / GLYCOLAX Take 17 g by mouth 2 (two) times daily.   potassium chloride 10 MEQ CR capsule Commonly known as: MICRO-K TAKE 2 CAPSULES BY MOUTH DAILY.   predniSONE 5 MG tablet Commonly known as: DELTASONE Take 1 tablet (5 mg total) by mouth daily with breakfast.   rivaroxaban 10 MG Tabs tablet Commonly  known as: XARELTO Take 1 tablet (10 mg total) by mouth daily with breakfast for 21 days.   senna 8.6 MG Tabs tablet Commonly known as: SENOKOT Take 2 tablets (17.2 mg total) by mouth at bedtime for 14 days.   Trelegy Ellipta 200-62.5-25 MCG/ACT Aepb Generic drug: Fluticasone-Umeclidin-Vilant Inhale 1 puff into the lungs daily.   vitamin C 1000 MG tablet Take 1,000 mg by mouth daily.   Zinc 20 MG Caps Take 40 mg by mouth daily.               Discharge Care Instructions  (From admission, onward)           Start     Ordered   01/21/24 0000  Change dressing       Comments: Maintain surgical dressing until follow up in the clinic. If the edges start to pull up, may reinforce with tape. If the dressing is no longer working, may remove and cover with gauze and tape, but must keep the area dry and clean.  Call with any questions or concerns.   01/21/24 0801            Diagnostic Studies: DG Pelvis Portable Result Date: 01/20/2024 CLINICAL DATA:  Status post right hip arthroplasty. EXAM: PORTABLE PELVIS 1-2 VIEWS COMPARISON:  None Available. FINDINGS: Right hip arthroplasty in expected alignment. No periprosthetic lucency or fracture. Recent postsurgical change includes air and edema in the soft tissues. IMPRESSION: Right hip arthroplasty without immediate postoperative complication. Electronically Signed   By: Narda Rutherford M.D.   On: 01/20/2024 16:31   DG HIP UNILAT WITH PELVIS 1V RIGHT Result Date: 01/20/2024 CLINICAL DATA:  Elective surgery. EXAM: DG HIP (WITH OR WITHOUT PELVIS) 1V RIGHT COMPARISON:  None Available. FINDINGS: Eleven fluoroscopic spot views of the pelvis and right hip obtained in the operating room. Sequential images during hip arthroplasty. Fluoroscopy time 8 seconds. Dose 0.74 mGy. Previous left hip arthroplasty. IMPRESSION: Intraoperative fluoroscopy during right hip arthroplasty. Electronically Signed   By: Narda Rutherford M.D.   On: 01/20/2024 16:30    DG C-Arm 1-60 Min-No Report Result Date: 01/20/2024 Fluoroscopy was utilized by the requesting physician.  No radiographic interpretation.   DG C-Arm 1-60 Min-No Report Result Date: 01/20/2024 Fluoroscopy was utilized by the requesting physician.  No radiographic interpretation.    Disposition: Discharge disposition: 01-Home or Self Care       Discharge Instructions     Call MD / Call 911   Complete by: As directed    If you experience chest pain or shortness of breath, CALL 911 and be transported to the hospital emergency room.  If you develope a fever above 101 F, pus (white drainage) or increased drainage or redness at the wound, or calf pain, call your surgeon's office.   Change dressing  Complete by: As directed    Maintain surgical dressing until follow up in the clinic. If the edges start to pull up, may reinforce with tape. If the dressing is no longer working, may remove and cover with gauze and tape, but must keep the area dry and clean.  Call with any questions or concerns.   Constipation Prevention   Complete by: As directed    Drink plenty of fluids.  Prune juice may be helpful.  You may use a stool softener, such as Colace (over the counter) 100 mg twice a day.  Use MiraLax (over the counter) for constipation as needed.   Diet - low sodium heart healthy   Complete by: As directed    Increase activity slowly as tolerated   Complete by: As directed    Weight bearing as tolerated with assist device (walker, cane, etc) as directed, use it as long as suggested by your surgeon or therapist, typically at least 4-6 weeks.   Post-operative opioid taper instructions:   Complete by: As directed    POST-OPERATIVE OPIOID TAPER INSTRUCTIONS: It is important to wean off of your opioid medication as soon as possible. If you do not need pain medication after your surgery it is ok to stop day one. Opioids include: Codeine, Hydrocodone(Norco, Vicodin), Oxycodone(Percocet,  oxycontin) and hydromorphone amongst others.  Long term and even short term use of opiods can cause: Increased pain response Dependence Constipation Depression Respiratory depression And more.  Withdrawal symptoms can include Flu like symptoms Nausea, vomiting And more Techniques to manage these symptoms Hydrate well Eat regular healthy meals Stay active Use relaxation techniques(deep breathing, meditating, yoga) Do Not substitute Alcohol to help with tapering If you have been on opioids for less than two weeks and do not have pain than it is ok to stop all together.  Plan to wean off of opioids This plan should start within one week post op of your joint replacement. Maintain the same interval or time between taking each dose and first decrease the dose.  Cut the total daily intake of opioids by one tablet each day Next start to increase the time between doses. The last dose that should be eliminated is the evening dose.      TED hose   Complete by: As directed    Use stockings (TED hose) for 2 weeks on both leg(s).  You may remove them at night for sleeping.        Follow-up Information     Durene Romans, MD. Schedule an appointment as soon as possible for a visit in 2 week(s).   Specialty: Orthopedic Surgery Contact information: [address] [place] [address] [place] [address] [phone]                  Signed: Cassandria Anger 02/03/2024, 7:12 AM

## 2024-02-12 ENCOUNTER — Ambulatory Visit (INDEPENDENT_AMBULATORY_CARE_PROVIDER_SITE_OTHER)

## 2024-02-12 DIAGNOSIS — C772 Secondary and unspecified malignant neoplasm of intra-abdominal lymph nodes: Secondary | ICD-10-CM

## 2024-02-12 DIAGNOSIS — C61 Malignant neoplasm of prostate: Secondary | ICD-10-CM

## 2024-02-12 NOTE — Progress Notes (Addendum)
 Firmagon Sub Q Injection  Due to Prostate Cancer patient is present today for a Firmagon Injection.  Order received and reviewed and authorization verification reviewed.   Medication: Dan [name] (Degarelix)  Dose: 80mg  Location: right upper abdomen cleaned and prepped with alcohol prior to injection  Patient tolerated well, no complications were noted Band aid applied over injection site.   Performed by: Akasia Ahmad LPN  Follow up: 1 month for injection. Appointment scheduled with patient.

## 2024-02-23 ENCOUNTER — Other Ambulatory Visit (HOSPITAL_COMMUNITY): Payer: Self-pay

## 2024-02-24 NOTE — Progress Notes (Deleted)
 Dan [name], male    DOB: 02-Apr-1946    MRN: [id number]   Brief patient profile:  40  yowm active smoker  referred to pulmonary clinic in [place]  01/13/2024 by Dr Cheree Cords  for doe  x  2023 while on chemo po dytiga for prostate ca.   Cardiac eval by [facility] cards neg but copd on cxr 9/9/924      History of Present Illness  01/13/2024  Pulmonary/ 1st office eval/ [facility] / [facility] trelegy x 3-4 y Chief Complaint  Patient presents with   Consult    Copd   Dyspnea:  also limited by R hip / 2 wheeled walker for hip surgery  Cough: still some rattle / worse in am> mucoid Sleep: flat bed/ one pillow  SABA use: occasionally hfa/ never neb  02:  prn  Rec Plan A = Automatic = Always=    Trelegy 100 one click 1st thing in am  Prednisone  5 mg x 2 each am until surgery  Plan B = Backup (to supplement plan A, not to replace it) Only use your albuterol inhaler as a rescue medication  Plan C = Crisis (instead of Plan B but only if Plan B stops working) - only use your albuterol nebulizer if you first try Plan B  Also  Ok to try albuterol  right before you leave home for  surgery and also  15 min before an activity (on alternating days)  that you know would usually make you short of breath  For cough/ congestion > mucinex up to maximum of  1200 mg every 12 hours as needed  Depomedrol 120 mg IM  You are cleared for surgery - you should not smoker prior to surgery.  02/25/2024  f/u ov/[place] office/Donne Baley re: *** maint on ***  No chief complaint on file.   Dyspnea:  *** Cough: *** Sleeping: ***   resp cc  SABA use: *** 02: ***  Lung cancer screening: ***   No obvious day to day or daytime variability or assoc excess/ purulent sputum or mucus plugs or hemoptysis or cp or chest tightness, subjective wheeze or overt sinus or hb symptoms.    Also denies any obvious fluctuation of symptoms with weather or environmental changes or other aggravating or alleviating factors except  as outlined above   No unusual exposure hx or h/o childhood pna/ asthma or knowledge of premature birth.  Current Allergies, Complete Past Medical History, Past Surgical History, Family History, and Social History were reviewed in [employer] record.  ROS  The following are not active complaints unless bolded Hoarseness, sore throat, dysphagia, dental problems, itching, sneezing,  nasal congestion or discharge of excess mucus or purulent secretions, ear ache,   fever, chills, sweats, unintended wt loss or wt gain, classically pleuritic or exertional cp,  orthopnea pnd or arm/hand swelling  or leg swelling, presyncope, palpitations, abdominal pain, anorexia, nausea, vomiting, diarrhea  or change in bowel habits or change in bladder habits, change in stools or change in urine, dysuria, hematuria,  rash, arthralgias, visual complaints, headache, numbness, weakness or ataxia or problems with walking or coordination,  change in mood or  memory.        No outpatient medications have been marked as taking for the 02/25/24 encounter (Appointment) with Fransico Sciandra B, MD.   Current Facility-Administered Medications for the 02/25/24 encounter (Appointment) with Finbar Nippert B, MD  Medication   degarelix  (FIRMAGON ) injection 80 mg  Past Medical History:  Diagnosis Date   BPH (benign prostatic hyperplasia)    COPD (chronic obstructive pulmonary disease) (HCC)    Depression    ED (erectile dysfunction)    Gout    Hyperlipidemia    Hypertension    Loss of hearing    Nocturia    Paroxysmal atrial fibrillation (HCC)    Prostate cancer (HCC)       Objective:    Wt Readings from Last 3 Encounters:  01/20/24 151 lb (68.5 kg)  01/13/24 151 lb (68.5 kg)  01/01/24 151 lb 7.3 oz (68.7 kg)      Vital signs reviewed  02/25/2024  - Note at rest 02 sats  ***% on ***   General appearance:    ***      Mod bar***      Assessment

## 2024-02-25 ENCOUNTER — Ambulatory Visit: Admitting: Internal Medicine

## 2024-02-25 ENCOUNTER — Encounter: Payer: Self-pay | Admitting: Internal Medicine

## 2024-02-27 DIAGNOSIS — R29818 Other symptoms and signs involving the nervous system: Secondary | ICD-10-CM | POA: Diagnosis not present

## 2024-02-27 DIAGNOSIS — Z96641 Presence of right artificial hip joint: Secondary | ICD-10-CM | POA: Diagnosis not present

## 2024-02-27 DIAGNOSIS — Z471 Aftercare following joint replacement surgery: Secondary | ICD-10-CM | POA: Diagnosis not present

## 2024-03-02 ENCOUNTER — Other Ambulatory Visit: Payer: Self-pay

## 2024-03-02 ENCOUNTER — Inpatient Hospital Stay: Payer: HMO | Attending: Hematology

## 2024-03-02 ENCOUNTER — Encounter: Payer: Self-pay | Admitting: Urology

## 2024-03-02 DIAGNOSIS — C775 Secondary and unspecified malignant neoplasm of intrapelvic lymph nodes: Secondary | ICD-10-CM | POA: Diagnosis not present

## 2024-03-02 DIAGNOSIS — C61 Malignant neoplasm of prostate: Secondary | ICD-10-CM | POA: Diagnosis not present

## 2024-03-02 DIAGNOSIS — E611 Iron deficiency: Secondary | ICD-10-CM | POA: Diagnosis not present

## 2024-03-02 DIAGNOSIS — D508 Other iron deficiency anemias: Secondary | ICD-10-CM

## 2024-03-02 LAB — CBC WITH DIFFERENTIAL/PLATELET
Abs Immature Granulocytes: 0.09 10*3/uL — ABNORMAL HIGH (ref 0.00–0.07)
Basophils Absolute: 0.1 10*3/uL (ref 0.0–0.1)
Basophils Relative: 1 %
Eosinophils Absolute: 0.3 10*3/uL (ref 0.0–0.5)
Eosinophils Relative: 4 %
HCT: 34.1 % — ABNORMAL LOW (ref 39.0–52.0)
Hemoglobin: 10.8 g/dL — ABNORMAL LOW (ref 13.0–17.0)
Immature Granulocytes: 1 %
Lymphocytes Relative: 19 %
Lymphs Abs: 1.3 10*3/uL (ref 0.7–4.0)
MCH: 29.8 pg (ref 26.0–34.0)
MCHC: 31.7 g/dL (ref 30.0–36.0)
MCV: 93.9 fL (ref 80.0–100.0)
Monocytes Absolute: 0.8 10*3/uL (ref 0.1–1.0)
Monocytes Relative: 12 %
Neutro Abs: 4.4 10*3/uL (ref 1.7–7.7)
Neutrophils Relative %: 63 %
Platelets: 393 10*3/uL (ref 150–400)
RBC: 3.63 MIL/uL — ABNORMAL LOW (ref 4.22–5.81)
RDW: 16.8 % — ABNORMAL HIGH (ref 11.5–15.5)
WBC: 6.9 10*3/uL (ref 4.0–10.5)
nRBC: 0 % (ref 0.0–0.2)

## 2024-03-02 LAB — IRON AND TIBC
Iron: 32 ug/dL — ABNORMAL LOW (ref 45–182)
Saturation Ratios: 13 % — ABNORMAL LOW (ref 17.9–39.5)
TIBC: 256 ug/dL (ref 250–450)
UIBC: 224 ug/dL

## 2024-03-02 LAB — COMPREHENSIVE METABOLIC PANEL WITH GFR
ALT: 10 U/L (ref 0–44)
AST: 13 U/L — ABNORMAL LOW (ref 15–41)
Albumin: 3.2 g/dL — ABNORMAL LOW (ref 3.5–5.0)
Alkaline Phosphatase: 83 U/L (ref 38–126)
Anion gap: 11 (ref 5–15)
BUN: 12 mg/dL (ref 8–23)
CO2: 24 mmol/L (ref 22–32)
Calcium: 10.2 mg/dL (ref 8.9–10.3)
Chloride: 95 mmol/L — ABNORMAL LOW (ref 98–111)
Creatinine, Ser: 1.09 mg/dL (ref 0.61–1.24)
GFR, Estimated: >60 mL/min (ref >60)
Glucose, Bld: 110 mg/dL — ABNORMAL HIGH (ref 70–99)
Potassium: 3.9 mmol/L (ref 3.5–5.1)
Sodium: 130 mmol/L — ABNORMAL LOW (ref 135–145)
Total Bilirubin: 0.5 mg/dL (ref 0.0–1.2)
Total Protein: 6.3 g/dL — ABNORMAL LOW (ref 6.5–8.1)

## 2024-03-02 LAB — PSA: Prostatic Specific Antigen: <0.01 ng/mL (ref 0.00–4.00)

## 2024-03-02 LAB — FERRITIN: Ferritin: 213 ng/mL (ref 24–336)

## 2024-03-09 ENCOUNTER — Inpatient Hospital Stay: Payer: HMO | Admitting: Hematology

## 2024-03-09 NOTE — Progress Notes (Incomplete)
 [facility] [address] [place] [address]    Clinic Day:  03/09/2024  Referring physician: Leesa Pulling, MD  Patient Care Team: Leesa Pulling, MD as PCP - General (Family Medicine) Amanda Jungling Joyceann No, MD as PCP - Cardiology (Cardiology) Riley Cheadle Windsor Hatcher, MD as Consulting Physician (Gastroenterology) Paulett Boros, MD as Medical Oncologist (Medical Oncology) Gerhard Knuckles, RN as Oncology Nurse Navigator (Medical Oncology) Diamond Formica, MD as Consulting Physician (Pulmonary Disease)   ASSESSMENT & PLAN:   Assessment: 1.  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 Nyla Bell on 09/22/2018 - Final pathology: PT3b N0, Gleason 4+3 prostate adenocarcinoma, postoperative PSA remained detectable and rose to 5.7 - Axumin PET (06/2019): Local recurrence - Salvage XRT to the prostate fossa and pelvic lymph nodes, Dr. Jorden Nevin from 07/05/2019 - 08/31/2019, without concurrent ADT.  PSA responded well reaching a nadir of 0.1. - PSA rose again to 3.4 in 06/2022, PSMA PET scan: Oligometastatic disease involving single internal iliac lymph node. - 07/29/2022 - 08/09/2022: 10 fx course of UHRT to right internal iliac node, PSA only decreased slightly to 3.1 in 10/2022, further went up to 5 on 12/05/2022 - PSMA PET (12/19/2022): New radiotracer avid 2 mm infrarenal retrocaval lymph node and a minimal increase size of the previously treated right common iliac lymph node measuring 3 mm compared to 2 mm previously, no evidence of recurrence in the prostatectomy bed or bone mets. - Loading dose of Firmagon  with Dr. Inga Manges on 01/09/2023 - Abiraterone  and prednisone  started on 01/18/2023. - XRT to the retrocaval lymph node and right common iliac lymph node completed on 01/29/2023, 10 fractions   2.  Social/family history: - He lives with wife at home.  He is independent of all ADLs and IADLs.  Uses cane for longer  distances.  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  Current active smoker, 1 pack/day for 45 years. - Father had lung cancer and was smoker.    Plan: 1.  Metastatic CSPC to abdominal/high pelvic lymph node (low-volume): - He is tolerating abiraterone  and prednisone  very well. - Last PSA is 0.01 on 12/03/2023. - Continue monthly Firmagon  with Dr. Wrenn. - Continue abiraterone  500 mg daily and prednisone  5 mg daily.  RTC 2 months for follow-up with repeat PSA.   2.  Iron  deficiency state: - He received INFeD  1 g on 12/02/2023 for severe fatigue.   3.  Mild hypercalcemia: - Mild hypercalcemia is stable at 10.8.  Not on calcium supplements.   4.  Osteoporosis (DEXA on 03/17/2023 T score -4): - Received 1 dose of Prolia  on 04/03/2023 and decided not to continue it.  5.  Right neck mass: - He reported noticing right neck swelling for the past 1 to 40-months. 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  No cervical adenopathy. - Will make referral to ENT Dr. Tellis Feathers.  6.  JAK2 V617F/BCR-ABL negative leukocytosis and thrombocytosis: - We reviewed results of JAK2 V617F and BCR/ABL which were negative.  Likely reactive thrombocytosis and leukocytosis from smoking and prednisone .    No orders of the defined types were placed in this encounter.     Nadeen Augusta Teague,acting as a Neurosurgeon for Paulett Boros, MD.,have documented all relevant documentation on the behalf of Paulett Boros,  MD,as directed by  Paulett Boros, MD while in the presence of Paulett Boros, MD.  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 RALP 09/22/18 (Dr. Nyla Bell) 2. Salvage XRT + ADT 07/05/2019 - 08/31/2019 (Dr. Jorden Nevin) 3. UHRT to right internal iliac node 07/29/22 - 08/09/22 4. UHRT to infrarenal retrocaval lymph node 01/16/23 - 01/29/23  Current Therapy:  Firmagon  and Abiraterone     HISTORY OF PRESENT ILLNESS:   Oncology History   No history exists.     INTERVAL HISTORY:   Dan [name] is a 78 y.o. 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 He was last seen by me on 01/01/24.  Since his last visit, he underwent right hip arthroplasty on 01/20/24 under Dr. Bernard Brick.   Today, he states that he is doing well overall. His appetite level is at ***%. His energy level is at ***%. He is accompanied by his wife.   PAST MEDICAL HISTORY:   Past Medical History: Past Medical History:  Diagnosis Date   Anemia    Arthritis    Asthma    BPH (benign prostatic hyperplasia)    COPD (chronic obstructive pulmonary disease) (HCC)    Depression    ED (erectile dysfunction)    Gout    Hyperlipidemia    Hypertension    Loss of hearing    Nocturia    Paroxysmal atrial fibrillation (HCC)    Prostate cancer [facility])     Surgical History: Past Surgical History:  Procedure Laterality Date   CARDIOVASCULAR STRESS TEST  12/31/2004   normal perfuction nuclear study w/ no evidence ischemia /  normal LV function and wall motion , ef 52%   PROSTATECTOMY  2019   TOTAL HIP ARTHROPLASTY Left    TOTAL HIP ARTHROPLASTY Right 01/20/2024   Procedure: ARTHROPLASTY, HIP, TOTAL, ANTERIOR APPROACH;  Surgeon: Claiborne Crew, MD;  Location: [facility] ORS;  Service: Orthopedics;  Laterality: Right;    Social History: Social History   Socioeconomic History   Marital status: Married    Spouse name: Not on file   Number of children: Not on file    Years of education: Not on file   Highest education level: Not on file  Occupational History   Not on file  Tobacco Use   Smoking status: Every Day    Current packs/day: 0.50    Average packs/day: 0.5 packs/day for 56.7 years (28.4 ttl pk-yrs)    Types: Cigarettes    Start date: 06/12/1967    Passive exposure: Never   Smokeless tobacco: Never  Vaping Use   Vaping status: Never Used  Substance and Sexual Activity   Alcohol  use: Not Currently   Drug use: Not Currently   Sexual activity: Not Currently  Other Topics Concern   Not on file  Social History Narrative   Not on file   Social Drivers of Health   Financial Resource Strain: Not on file  Food Insecurity: No Food Insecurity (01/20/2024)   Hunger Vital Sign    Worried About Running Out of Food in the Last Year: Never true    Ran  Out of Food in the Last Year: Never true  Transportation Needs: No Transportation Needs (01/20/2024)   PRAPARE - Administrator, Civil Service (Medical): No    Lack of Transportation (Non-Medical): No  Physical Activity: Not on file  Stress: Not on file  Social Connections: Unknown (01/20/2024)   Social Connection and Isolation Panel [NHANES]    Frequency of Communication with Friends and Family: Patient declined    Frequency of Social Gatherings with Friends and Family: Patient declined    Attends Religious Services: Patient declined    Database administrator or Organizations: Patient declined    Attends Banker Meetings: Patient declined    Marital Status: Married  Catering manager Violence: Not At Risk (01/20/2024)   Humiliation, Afraid, Rape, and Kick questionnaire    Fear of Current or Ex-Partner: No    Emotionally Abused: No    Physically Abused: No    Sexually Abused: No    Family History: Family History  Problem Relation Age of Onset   Hypertension Mother    Cancer Father        lung   Colon cancer Neg Hx    Inflammatory bowel disease Neg Hx    Pancreatic  cancer Neg Hx     Current Medications:  Current Outpatient Medications:    abiraterone  acetate (ZYTIGA ) 250 MG tablet, Take 4 tablets (1,000 mg total) by mouth daily. Take on an empty stomach 1 hour before or 2 hours after a meal (Patient taking differently: Take 500 mg by mouth daily. Take on an empty stomach 1 hour before or 2 hours after a meal), Disp: 120 tablet, Rfl: 3   allopurinol  (ZYLOPRIM ) 100 MG tablet, Take 100 mg by mouth daily., Disp: , Rfl:    Ascorbic Acid (VITAMIN C) 1000 MG tablet, Take 1,000 mg by mouth daily., Disp: , Rfl:    cholecalciferol (VITAMIN D3) 25 MCG (1000 UNIT) tablet, Take 1,000 Units by mouth daily., Disp: , Rfl:    diltiazem  (CARDIZEM  CD) 360 MG 24 hr capsule, Take 360 mg by mouth daily., Disp: , Rfl:    HYDROcodone -acetaminophen  (NORCO) 10-325 MG tablet, Take 1 tablet by mouth every 6 (six) hours as needed., Disp: 30 tablet, Rfl: 0   losartan  (COZAAR ) 25 MG tablet, TAKE 1 TABLET (25 MG TOTAL) BY MOUTH DAILY., Disp: 90 tablet, Rfl: 1   magnesium  oxide (MAG-OX) 400 (240 Mg) MG tablet, TAKE 1 TABLET BY MOUTH EVERY DAY, Disp: 90 tablet, Rfl: 1   megestrol  (MEGACE ) 40 MG/ML suspension, Take 400 mg by mouth daily as needed (appetite)., Disp: , Rfl:    methocarbamol  (ROBAXIN ) 500 MG tablet, Take 1 tablet (500 mg total) by mouth every 6 (six) hours as needed for muscle spasms., Disp: 40 tablet, Rfl: 2   polyethylene glycol (MIRALAX  / GLYCOLAX ) 17 g packet, Take 17 g by mouth 2 (two) times daily., Disp: 14 each, Rfl: 0   potassium chloride  (MICRO-K ) 10 MEQ CR capsule, TAKE 2 CAPSULES BY MOUTH DAILY., Disp: 7 capsule, Rfl: 0   predniSONE  (DELTASONE ) 5 MG tablet, Take 1 tablet (5 mg total) by mouth daily with breakfast., Disp: 90 tablet, Rfl: 1   rivaroxaban  (XARELTO ) 10 MG TABS tablet, Take 1 tablet (10 mg total) by mouth daily with breakfast for 21 days., Disp: 21 tablet, Rfl: 0   TRELEGY ELLIPTA 200-62.5-25 MCG/ACT AEPB, Inhale 1 puff into the lungs daily., Disp: , Rfl:     Zinc 20 MG CAPS, Take 40 mg by  mouth daily., Disp: , Rfl:   Current Facility-Administered Medications:    degarelix  (FIRMAGON ) injection 80 mg, 80 mg, Subcutaneous, Q28 days, Wrenn, John, MD, 80 mg at 02/12/24 1127   Allergies: Allergies  Allergen Reactions   Tamsulosin Hives and Itching    REVIEW OF SYSTEMS:   Review of Systems  Constitutional:  Negative for chills, fatigue and fever.  HENT:   Negative for lump/mass, mouth sores, nosebleeds, sore throat and trouble swallowing.   Eyes:  Negative for eye problems.  Respiratory:  Negative for cough and shortness of breath.   Cardiovascular:  Negative for chest pain, leg swelling and palpitations.  Gastrointestinal:  Negative for abdominal pain, constipation, diarrhea, nausea and vomiting.  Genitourinary:  Negative for bladder incontinence, difficulty urinating, dysuria, frequency, hematuria and nocturia.   Musculoskeletal:  Negative for arthralgias, back pain, flank pain, myalgias and neck pain.  Skin:  Negative for itching and rash.  Neurological:  Negative for dizziness, headaches and numbness.  Hematological:  Does not bruise/bleed easily.  Psychiatric/Behavioral:  Negative for depression, sleep disturbance and suicidal ideas. The patient is not nervous/anxious.   All other systems reviewed and are negative.    VITALS:   There were no vitals taken for this visit.  Wt Readings from Last 3 Encounters:  01/20/24 151 lb (68.5 kg)  01/13/24 151 lb (68.5 kg)  01/01/24 151 lb 7.3 oz (68.7 kg)    There is no height or weight on file to calculate BMI.  Performance status (ECOG): 1 - Symptomatic but completely ambulatory  PHYSICAL EXAM:   Physical Exam Vitals and nursing note reviewed. Exam conducted with a chaperone present.  Constitutional:      Appearance: Normal appearance.  Cardiovascular:     Rate and Rhythm: Normal rate and regular rhythm.     Pulses: Normal pulses.     Heart sounds: Normal heart sounds.   Pulmonary:     Effort: Pulmonary effort is normal.     Breath sounds: Normal breath sounds.  Abdominal:     Palpations: Abdomen is soft. There is no hepatomegaly, splenomegaly or mass.     Tenderness: There is no abdominal tenderness.  Musculoskeletal:     Right lower leg: No edema.     Left lower leg: No edema.  Lymphadenopathy:     Cervical: No cervical adenopathy.     Right cervical: No superficial, deep or posterior cervical adenopathy.    Left cervical: No superficial, deep or posterior cervical adenopathy.     Upper Body:     Right upper body: No supraclavicular or axillary adenopathy.     Left upper body: No supraclavicular or axillary adenopathy.  Neurological:     General: No focal deficit present.     Mental Status: He is alert and oriented to person, place, and time.  Psychiatric:        Mood and Affect: Mood normal.        Behavior: Behavior normal.     LABS:      Latest Ref Rng & Units 03/02/2024   10:49 AM 01/21/2024    3:41 AM 01/13/2024    1:53 PM  CBC  WBC 4.0 - 10.5 K/uL 6.9  21.0  12.5   Hemoglobin 13.0 - 17.0 g/dL 16.1  8.9  09.6   Hematocrit 39.0 - 52.0 % 34.1  27.4  32.0   Platelets 150 - 400 K/uL 393  327  401       Latest Ref Rng & Units 03/02/2024  10:49 AM 01/21/2024    3:41 AM 01/13/2024    1:53 PM  CMP  Glucose 70 - 99 mg/dL 952  841  324   BUN 8 - 23 mg/dL 12  37  19   Creatinine 0.61 - 1.24 mg/dL 4.01  0.27  2.53   Sodium 135 - 145 mmol/L 130  134  132   Potassium 3.5 - 5.1 mmol/L 3.9  4.4  4.3   Chloride 98 - 111 mmol/L 95  106  103   CO2 22 - 32 mmol/L 24  20  18    Calcium 8.9 - 10.3 mg/dL 66.4  40.3  47.4   Total Protein 6.5 - 8.1 g/dL 6.3     Total Bilirubin 0.0 - 1.2 mg/dL 0.5     Alkaline Phos 38 - 126 U/L 83     AST 15 - 41 U/L 13     ALT 0 - 44 U/L 10        No results found for: "CEA1", "CEA" / No results found for: "CEA1", "CEA" Lab Results  Component Value Date   PSA1 <0.1 08/14/2023   No results found for:  "QVZ563" No results found for: "CAN125"  No results found for: "TOTALPROTELP", "ALBUMINELP", "A1GS", "A2GS", "BETS", "BETA2SER", "GAMS", "MSPIKE", "SPEI" Lab Results  Component Value Date   TIBC 256 03/02/2024   TIBC 340 10/13/2023   TIBC 343 07/09/2023   FERRITIN 213 03/02/2024   FERRITIN 44 10/13/2023   FERRITIN 51 07/09/2023   IRONPCTSAT 13 (L) 03/02/2024   IRONPCTSAT 18 10/13/2023   IRONPCTSAT 20 07/09/2023   Lab Results  Component Value Date   LDH 99 12/03/2023     STUDIES:   No results found.

## 2024-03-11 ENCOUNTER — Ambulatory Visit

## 2024-03-11 DIAGNOSIS — C775 Secondary and unspecified malignant neoplasm of intrapelvic lymph nodes: Secondary | ICD-10-CM

## 2024-03-11 DIAGNOSIS — C61 Malignant neoplasm of prostate: Secondary | ICD-10-CM | POA: Diagnosis not present

## 2024-03-11 DIAGNOSIS — Z8546 Personal history of malignant neoplasm of prostate: Secondary | ICD-10-CM

## 2024-03-11 NOTE — Progress Notes (Signed)
 Firmagon  Sub Q Injection  Due to Prostate Cancer patient is present today for a Firmagon  Injection.  Order received and reviewed and authorization verification reviewed.   Medication: Firmagon  (Degarelix )  Dose: 80mg  Location: left upper abdomen cleaned and prepped with alcohol  prior to injection Lot: [id number] Exp: 11/04/2025  Patient tolerated well, no complications were noted Band aid applied over injection site.   Performed by: Melvenia Stabs. CMA  Follow up: 1 month for injection. Appointment scheduled with patient.

## 2024-03-17 DIAGNOSIS — M5416 Radiculopathy, lumbar region: Secondary | ICD-10-CM | POA: Diagnosis not present

## 2024-03-22 ENCOUNTER — Inpatient Hospital Stay: Attending: Hematology | Admitting: Hematology

## 2024-03-22 ENCOUNTER — Other Ambulatory Visit: Payer: Self-pay | Admitting: *Deleted

## 2024-03-22 VITALS — BP 139/56 | HR 74 | Temp 98.7°F | Resp 20 | Wt 144.0 lb

## 2024-03-22 DIAGNOSIS — E611 Iron deficiency: Secondary | ICD-10-CM | POA: Diagnosis not present

## 2024-03-22 DIAGNOSIS — R221 Localized swelling, mass and lump, neck: Secondary | ICD-10-CM | POA: Diagnosis not present

## 2024-03-22 DIAGNOSIS — F17210 Nicotine dependence, cigarettes, uncomplicated: Secondary | ICD-10-CM | POA: Insufficient documentation

## 2024-03-22 DIAGNOSIS — C775 Secondary and unspecified malignant neoplasm of intrapelvic lymph nodes: Secondary | ICD-10-CM | POA: Insufficient documentation

## 2024-03-22 DIAGNOSIS — C61 Malignant neoplasm of prostate: Secondary | ICD-10-CM | POA: Insufficient documentation

## 2024-03-22 DIAGNOSIS — D508 Other iron deficiency anemias: Secondary | ICD-10-CM

## 2024-03-22 MED ORDER — METHYLPHENIDATE HCL 5 MG PO TABS: 5.0000 mg | ORAL_TABLET | Freq: Every day | ORAL | 0 refills | Status: DC | PRN

## 2024-03-22 NOTE — Progress Notes (Signed)
 [facility] [address], [place] [address]    Clinic Day:  03/22/2024  Referring physician: Leesa Pulling, MD  Patient Care Team: Leesa Pulling, MD as PCP - General (Family Medicine) Amanda Jungling Joyceann No, MD as PCP - Cardiology (Cardiology) Riley Cheadle Windsor Hatcher, MD as Consulting Physician (Gastroenterology) Paulett Boros, MD as Medical Oncologist (Medical Oncology) Gerhard Knuckles, RN as Oncology Nurse Navigator (Medical Oncology) Diamond Formica, MD as Consulting Physician (Pulmonary Disease)   ASSESSMENT & PLAN:   Assessment: 1.  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 Nyla Bell on 09/22/2018 - Final pathology: PT3b N0, Gleason 4+3 prostate adenocarcinoma, postoperative PSA remained detectable and rose to 5.7 - Axumin PET (06/2019): Local recurrence - Salvage XRT to the prostate fossa and pelvic lymph nodes, Dr. Jorden Nevin from 07/05/2019 - 08/31/2019, without concurrent ADT.  PSA responded well reaching a nadir of 0.1. - PSA rose again to 3.4 in 06/2022, PSMA PET scan: Oligometastatic disease involving single internal iliac lymph node. - 07/29/2022 - 08/09/2022: 10 fx course of UHRT to right internal iliac node, PSA only decreased slightly to 3.1 in 10/2022, further went up to 5 on 12/05/2022 - PSMA PET (12/19/2022): New radiotracer avid 2 mm infrarenal retrocaval lymph node and a minimal increase size of the previously treated right common iliac lymph node measuring 3 mm compared to 2 mm previously, no evidence of recurrence in the prostatectomy bed or bone mets. - Loading dose of Firmagon  with Dr. Inga Manges on 01/09/2023 - Abiraterone  and prednisone  started on 01/18/2023. - XRT to the retrocaval lymph node and right common iliac lymph node completed on 01/29/2023, 10 fractions   2.  Social/family history: - He lives with wife at home.  He is independent of all ADLs and IADLs.  Uses cane for longer  distances.  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  Current active smoker, 1 pack/day for 45 years. - Father had lung cancer and was smoker.    Plan: 1.  Metastatic CSPC to abdominal/high pelvic lymph node (low-volume): - He is tolerating abiraterone  and prednisone  reasonably well. - He is continuing monthly Firmagon  with Dr. Inga Manges. - Labs from 03/02/2024: Normal LFTs and creatinine.  CBC grossly normal with mild anemia stable.  PSA is undetectable. - He complains of fatigue most of the time, likely from ADT.  Recommended trial of Ritalin  2.5-5 mg as needed.  We have given a prescription. - Continue abiraterone  500 mg daily and prednisone  5 mg daily.  RTC 3 months for follow-up with repeat PSA and other labs.   2.  Iron  deficiency state: - He received INFeD  1 g on 12/02/2023.  Ferritin is 213 and percent saturation is 13.  No indication for further therapy.   3.  Mild hypercalcemia: - He has intermittent mild hypercalcemia.  Current calcium level is normal.  He is not on calcium supplements.   4.  Osteoporosis (DEXA on 03/17/2023 T score -4): - Received 1 dose of Prolia  on 04/03/2023 and decided not to continue it.  5.  Right neck mass: - He has noticed right neck swelling for the past 3 months. 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  No cervical adenopathy. - We have made a referral to Dr. Tellis Feathers.  He is seeing him on Wednesday.  6.  JAK2 V617F/BCR-ABL negative leukocytosis and thrombocytosis: - Likely reactive leukocytosis and thrombocytosis from smoking and prednisone .    Orders Placed This Encounter  Procedures   CBC with Differential    Standing Status:   Future    Expected Date:   06/14/2024    Expiration Date:   03/22/2025   Comprehensive metabolic panel    Standing Status:   Future    Expected Date:   06/14/2024    Expiration  Date:   03/22/2025   Iron  and TIBC ([facility] ONLY)    Standing Status:   Future    Expected Date:   06/14/2024    Expiration Date:   03/22/2025   Ferritin    Standing Status:   Future    Expected Date:   06/14/2024    Expiration Date:   03/22/2025   PSA    Standing Status:   Future    Expected Date:   06/14/2024    Expiration Date:   03/22/2025      Dan [name],acting as a scribe for Paulett Boros, MD.,have documented all relevant documentation on the behalf of Paulett Boros, MD,as directed by  Paulett Boros, MD while in the presence of Paulett Boros, MD.  I, Paulett Boros MD, have reviewed the above documentation for accuracy and completeness, and I agree with the above.      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 RALP 09/22/18 (Dr. Nyla Bell) 2. Salvage XRT + ADT 07/05/2019 - 08/31/2019 (Dr. Jorden Nevin) 3. UHRT to right internal iliac node 07/29/22 - 08/09/22 4. UHRT to infrarenal retrocaval lymph node 01/16/23 - 01/29/23  Current Therapy:  Firmagon  and Abiraterone     HISTORY OF PRESENT ILLNESS:   Oncology History   No history exists.     INTERVAL HISTORY:   Dan [name] is a 78 y.o. 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 He was last seen by me on 01/01/24.  Since his last visit, he underwent total right hip arthroplasty on 01/20/24 with Dr. Bernard Brick.   Today, he states that he is doing well overall. His appetite level is at 75%. His energy level is at 10%.   PAST MEDICAL HISTORY:   Past Medical  History: Past Medical History:  Diagnosis Date   Anemia    Arthritis    Asthma    BPH (benign prostatic hyperplasia)    COPD (chronic obstructive pulmonary disease) (HCC)    Depression    ED (erectile dysfunction)    Gout    Hyperlipidemia    Hypertension    Loss of hearing    Nocturia    Paroxysmal atrial fibrillation (HCC)    Prostate cancer [facility])     Surgical History: Past Surgical History:  Procedure Laterality Date   CARDIOVASCULAR STRESS TEST  12/31/2004   normal perfuction nuclear study w/ no evidence ischemia /  normal LV function and wall motion , ef 52%   PROSTATECTOMY  2019   TOTAL HIP ARTHROPLASTY Left    TOTAL HIP ARTHROPLASTY Right 01/20/2024   Procedure: ARTHROPLASTY, HIP, TOTAL, ANTERIOR APPROACH;  Surgeon: Claiborne Crew,  MD;  Location: [facility];  Service: Orthopedics;  Laterality: Right;    Social History: Social History   Socioeconomic History   Marital status: Married    Spouse name: Not on file   Number of children: Not on file   Years of education: Not on file   Highest education level: Not on file  Occupational History   Not on file  Tobacco Use   Smoking status: Every Day    Current packs/day: 0.50    Average packs/day: 0.5 packs/day for 56.8 years (28.4 ttl pk-yrs)    Types: Cigarettes    Start date: 06/12/1967    Passive exposure: Never   Smokeless tobacco: Never  Vaping Use   Vaping status: Never Used  Substance and Sexual Activity   Alcohol  use: Not Currently   Drug use: Not Currently   Sexual activity: Not Currently  Other Topics Concern   Not on file  Social History Narrative   Not on file   Social Drivers of Health   Financial Resource Strain: Not on file  Food Insecurity: No Food Insecurity (01/20/2024)   Hunger Vital Sign    Worried About Running Out of Food in the Last Year: Never true    Ran Out of Food in the Last Year: Never true  Transportation Needs: No Transportation Needs (01/20/2024)   PRAPARE - Therapist, art (Medical): No    Lack of Transportation (Non-Medical): No  Physical Activity: Not on file  Stress: Not on file  Social Connections: Unknown (01/20/2024)   Social Connection and Isolation Panel [NHANES]    Frequency of Communication with Friends and Family: Patient declined    Frequency of Social Gatherings with Friends and Family: Patient declined    Attends Religious Services: Patient declined    Database administrator or Organizations: Patient declined    Attends Banker Meetings: Patient declined    Marital Status: Married  Catering manager Violence: Not At Risk (01/20/2024)   Humiliation, Afraid, Rape, and Kick questionnaire    Fear of Current or Ex-Partner: No    Emotionally Abused: No    Physically Abused: No    Sexually Abused: No    Family History: Family History  Problem Relation Age of Onset   Hypertension Mother    Cancer Father        lung   Colon cancer Neg Hx    Inflammatory bowel disease Neg Hx    Pancreatic cancer Neg Hx     Current Medications:  Current Outpatient Medications:    abiraterone  acetate (ZYTIGA ) 250 MG tablet, Take 4 tablets (1,000 mg total) by mouth daily. Take on an empty stomach 1 hour before or 2 hours after a meal (Patient taking differently: Take 500 mg by mouth daily. Take on an empty stomach 1 hour before or 2 hours after a meal), Disp: 120 tablet, Rfl: 3   allopurinol  (ZYLOPRIM ) 100 MG tablet, Take 100 mg by mouth daily., Disp: , Rfl:    Ascorbic Acid (VITAMIN C) 1000 MG tablet, Take 1,000 mg by mouth daily., Disp: , Rfl:    cholecalciferol (VITAMIN D3) 25 MCG (1000 UNIT) tablet, Take 1,000 Units by mouth daily., Disp: , Rfl:    diltiazem  (CARDIZEM  CD) 360 MG 24 hr capsule, Take 360 mg by mouth daily., Disp: , Rfl:    HYDROcodone -acetaminophen  (NORCO) 10-325 MG tablet, Take 1 tablet by mouth every 6 (six) hours as needed., Disp: 30 tablet, Rfl: 0   losartan  (  COZAAR ) 25 MG tablet, TAKE 1 TABLET (25 MG  TOTAL) BY MOUTH DAILY., Disp: 90 tablet, Rfl: 1   magnesium  oxide (MAG-OX) 400 (240 Mg) MG tablet, TAKE 1 TABLET BY MOUTH EVERY DAY, Disp: 90 tablet, Rfl: 1   megestrol  (MEGACE ) 40 MG/ML suspension, Take 400 mg by mouth daily as needed (appetite)., Disp: , Rfl:    methocarbamol  (ROBAXIN ) 500 MG tablet, Take 1 tablet (500 mg total) by mouth every 6 (six) hours as needed for muscle spasms., Disp: 40 tablet, Rfl: 2   mirtazapine (REMERON) 30 MG tablet, Take 30 mg by mouth at bedtime., Disp: , Rfl:    polyethylene glycol (MIRALAX  / GLYCOLAX ) 17 g packet, Take 17 g by mouth 2 (two) times daily., Disp: 14 each, Rfl: 0   potassium chloride  (MICRO-K ) 10 MEQ CR capsule, TAKE 2 CAPSULES BY MOUTH DAILY., Disp: 7 capsule, Rfl: 0   predniSONE  (DELTASONE ) 5 MG tablet, Take 1 tablet (5 mg total) by mouth daily with breakfast., Disp: 90 tablet, Rfl: 1   TRELEGY ELLIPTA 200-62.5-25 MCG/ACT AEPB, Inhale 1 puff into the lungs daily., Disp: , Rfl:    Zinc 20 MG CAPS, Take 40 mg by mouth daily., Disp: , Rfl:    methylphenidate  (RITALIN ) 5 MG tablet, Take 1 tablet (5 mg total) by mouth daily as needed., Disp: 30 tablet, Rfl: 0   rivaroxaban  (XARELTO ) 10 MG TABS tablet, Take 1 tablet (10 mg total) by mouth daily with breakfast for 21 days., Disp: 21 tablet, Rfl: 0  Current Facility-Administered Medications:    degarelix  (FIRMAGON ) injection 80 mg, 80 mg, Subcutaneous, Q28 days, Wrenn, John, MD, 80 mg at 03/11/24 1143   Allergies: Allergies  Allergen Reactions   Tamsulosin Hives and Itching    REVIEW OF SYSTEMS:   Review of Systems  Constitutional:  Positive for fatigue. Negative for chills and fever.  HENT:   Negative for lump/mass, mouth sores, nosebleeds, sore throat and trouble swallowing.   Eyes:  Negative for eye problems.  Respiratory:  Positive for shortness of breath. Negative for cough.   Cardiovascular:  Negative for chest pain, leg swelling and palpitations.  Gastrointestinal:  Negative for  abdominal pain, constipation, diarrhea, nausea and vomiting.  Genitourinary:  Negative for bladder incontinence, difficulty urinating, dysuria, frequency, hematuria and nocturia.   Musculoskeletal:  Negative for arthralgias, back pain, flank pain, myalgias and neck pain.  Skin:  Negative for itching and rash.  Neurological:  Negative for dizziness, headaches and numbness.  Hematological:  Does not bruise/bleed easily.  Psychiatric/Behavioral:  Positive for sleep disturbance. Negative for depression and suicidal ideas. The patient is not nervous/anxious.   All other systems reviewed and are negative.    VITALS:   Blood pressure (!) 139/56, pulse 74, temperature 98.7 F (37.1 C), temperature source Tympanic, resp. rate 20, weight 144 lb (65.3 kg), SpO2 99%.  Wt Readings from Last 3 Encounters:  03/22/24 144 lb (65.3 kg)  01/20/24 151 lb (68.5 kg)  01/13/24 151 lb (68.5 kg)    Body mass index is 20.66 kg/m.  Performance status (ECOG): 1 - Symptomatic but completely ambulatory  PHYSICAL EXAM:   Physical Exam Vitals and nursing note reviewed. Exam conducted with a chaperone present.  Constitutional:      Appearance: Normal appearance.  Cardiovascular:     Rate and Rhythm: Normal rate and regular rhythm.     Pulses: Normal pulses.     Heart sounds: Normal heart sounds.  Pulmonary:     Effort: Pulmonary effort is  normal.     Breath sounds: Normal breath sounds.  Abdominal:     Palpations: Abdomen is soft. There is no hepatomegaly, splenomegaly or mass.     Tenderness: There is no abdominal tenderness.  Musculoskeletal:     Right lower leg: No edema.     Left lower leg: No edema.  Lymphadenopathy:     Cervical: No cervical adenopathy.     Right cervical: No superficial, deep or posterior cervical adenopathy.    Left cervical: No superficial, deep or posterior cervical adenopathy.     Upper Body:     Right upper body: No supraclavicular or axillary adenopathy.     Left upper  body: No supraclavicular or axillary adenopathy.  Neurological:     General: No focal deficit present.     Mental Status: He is alert and oriented to person, place, and time.  Psychiatric:        Mood and Affect: Mood normal.        Behavior: Behavior normal.     LABS:      Latest Ref Rng & Units 03/02/2024   10:49 AM 01/21/2024    3:41 AM 01/13/2024    1:53 PM  CBC  WBC 4.0 - 10.5 K/uL 6.9  21.0  12.5   Hemoglobin 13.0 - 17.0 g/dL 60.4  8.9  54.0   Hematocrit 39.0 - 52.0 % 34.1  27.4  32.0   Platelets 150 - 400 K/uL 393  327  401       Latest Ref Rng & Units 03/02/2024   10:49 AM 01/21/2024    3:41 AM 01/13/2024    1:53 PM  CMP  Glucose 70 - 99 mg/dL 981  191  478   BUN 8 - 23 mg/dL 12  37  19   Creatinine 0.61 - 1.24 mg/dL 2.95  6.21  3.08   Sodium 135 - 145 mmol/L 130  134  132   Potassium 3.5 - 5.1 mmol/L 3.9  4.4  4.3   Chloride 98 - 111 mmol/L 95  106  103   CO2 22 - 32 mmol/L 24  20  18    Calcium 8.9 - 10.3 mg/dL 65.7  84.6  96.2   Total Protein 6.5 - 8.1 g/dL 6.3     Total Bilirubin 0.0 - 1.2 mg/dL 0.5     Alkaline Phos 38 - 126 U/L 83     AST 15 - 41 U/L 13     ALT 0 - 44 U/L 10        No results found for: "CEA1", "CEA" / No results found for: "CEA1", "CEA" Lab Results  Component Value Date   PSA1 <0.1 08/14/2023   No results found for: "XBM841" No results found for: "CAN125"  No results found for: "TOTALPROTELP", "ALBUMINELP", "A1GS", "A2GS", "BETS", "BETA2SER", "GAMS", "MSPIKE", "SPEI" Lab Results  Component Value Date   TIBC 256 03/02/2024   TIBC 340 10/13/2023   TIBC 343 07/09/2023   FERRITIN 213 03/02/2024   FERRITIN 44 10/13/2023   FERRITIN 51 07/09/2023   IRONPCTSAT 13 (L) 03/02/2024   IRONPCTSAT 18 10/13/2023   IRONPCTSAT 20 07/09/2023   Lab Results  Component Value Date   LDH 99 12/03/2023     STUDIES:   No results found.

## 2024-03-22 NOTE — Patient Instructions (Addendum)
 [facility] at [facility] Discharge Instructions   You were seen and examined today by Dr. Cheree Cords.  He reviewed the results of your lab work which are normal/stable.   Continue Zytiga  and prednisone  as prescribed.   We will see you back in 3 months. We will repeat lab work prior to this visit.    Return as scheduled.    Thank you for choosing [facility] at [facility] to provide your oncology and hematology care.  To afford each patient quality time with our provider, please arrive at least 15 minutes before your scheduled appointment time.   If you have a lab appointment with the Cancer Center please come in thru the Main Entrance and check in at the main information desk.  You need to re-schedule your appointment should you arrive 10 or more minutes late.  We strive to give you quality time with our providers, and arriving late affects you and other patients whose appointments are after yours.  Also, if you no show three or more times for appointments you may be dismissed from the clinic at the providers discretion.     Again, thank you for choosing [facility].  Our hope is that these requests will decrease the amount of time that you wait before being seen by our physicians.       _____________________________________________________________  Should you have questions after your visit to [facility], please contact our office at [phone] and follow the prompts.  Our office hours are 8:00 a.m. and 4:30 p.m. Monday - Friday.  Please note that voicemails left after 4:00 p.m. may not be returned until the following business day.  We are closed weekends and major holidays.  You do have access to a nurse 24-7, just call the main number to the clinic [phone] and do not press any options, hold on the line and a nurse will answer the phone.    For prescription refill requests, have your pharmacy contact our  office and allow 72 hours.    Due to Covid, you will need to wear a mask upon entering the hospital. If you do not have a mask, a mask will be given to you at the Main Entrance upon arrival. For doctor visits, patients may have 1 support person age 18 or older with them. For treatment visits, patients can not have anyone with them due to social distancing guidelines and our immunocompromised population.

## 2024-03-22 NOTE — Progress Notes (Signed)
 Patient is taking Zytiga as prescribed. He has not missed any doses and reports no side effects at this time.

## 2024-03-24 DIAGNOSIS — D490 Neoplasm of unspecified behavior of digestive system: Secondary | ICD-10-CM | POA: Diagnosis not present

## 2024-03-26 ENCOUNTER — Other Ambulatory Visit (HOSPITAL_COMMUNITY): Payer: Self-pay

## 2024-03-28 IMAGING — CT CT ABD-PEL WO/W CM
3 of 12 series · 12 of 46 positions shown, 18 images · IV contrast (Omnipaque or Isovue)
Comparison: None.

CLINICAL DATA: Gross hematuria positive cytology. History of
prostate cancer 6514 status post prostatectomy. * Tracking Code: BO
*

EXAM:
CT ABDOMEN AND PELVIS WITHOUT AND WITH CONTRAST
TECHNIQUE: Multidetector CT imaging of the abdomen and pelvis was performed
following the standard protocol before and following the bolus
administration of intravenous contrast.

[Series 2: axial pre · axial · non-contrast · 0.75mm/px · z∈[-535,-215]mm · 5 of 98 slices shown, 10 images]
[im 17/98  soft-tissue]
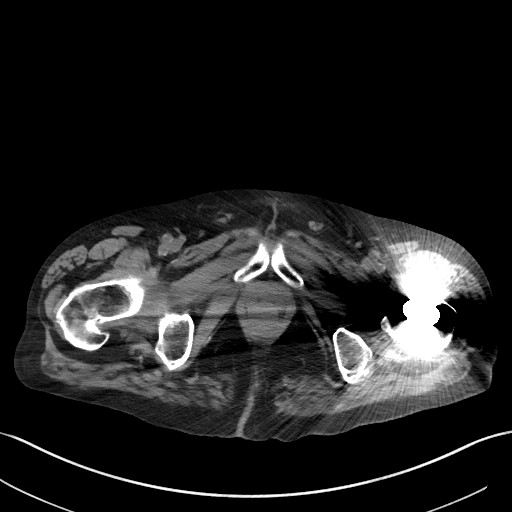
[im 17/98  bone]
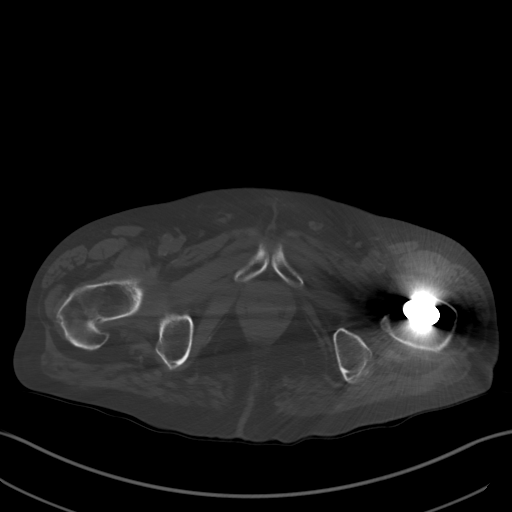
[im 33/98  soft-tissue]
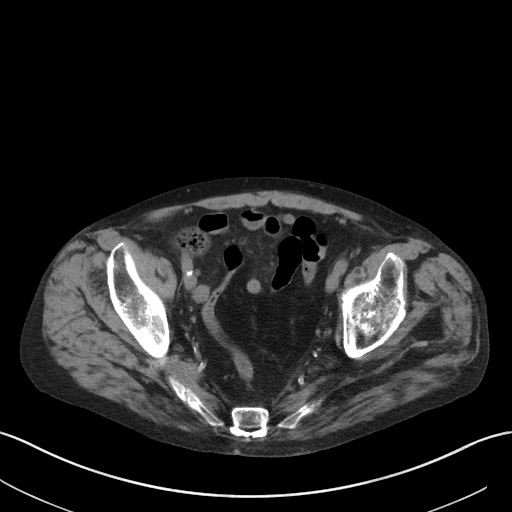
[im 33/98  lung]
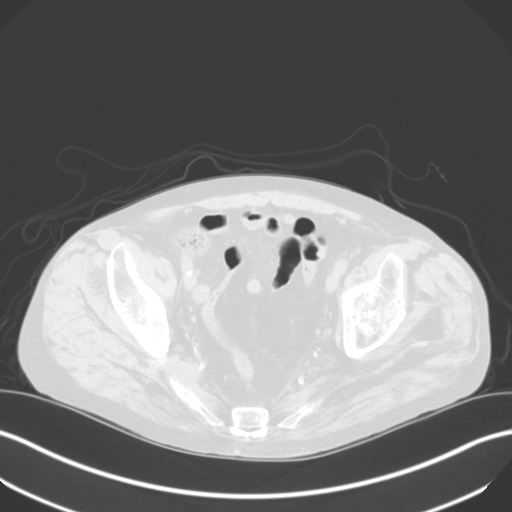
[im 49/98  soft-tissue]
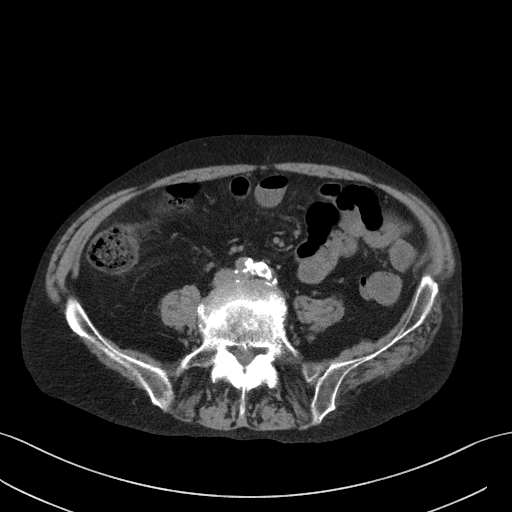
[im 49/98  lung]
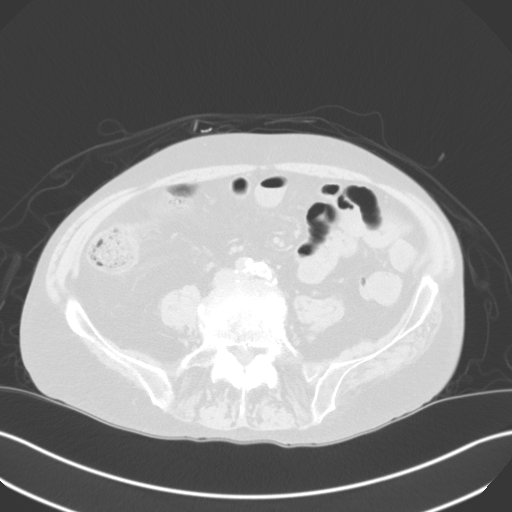
[im 65/98  soft-tissue]
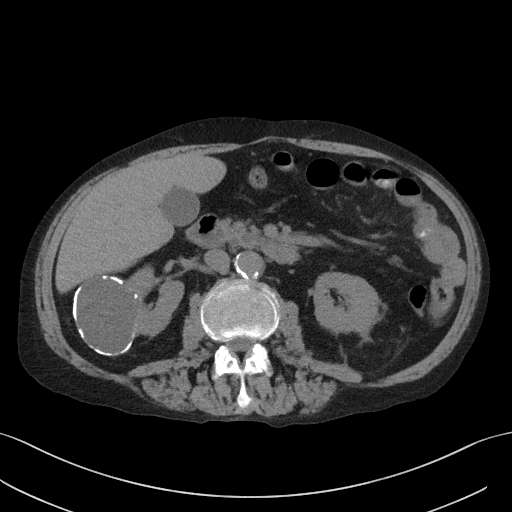
[im 65/98  lung]
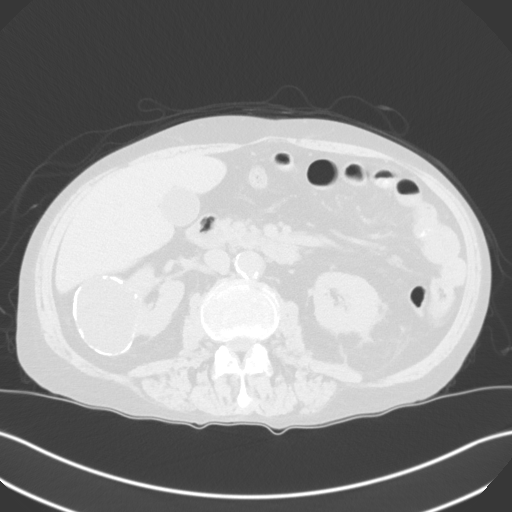
[im 81/98  soft-tissue]
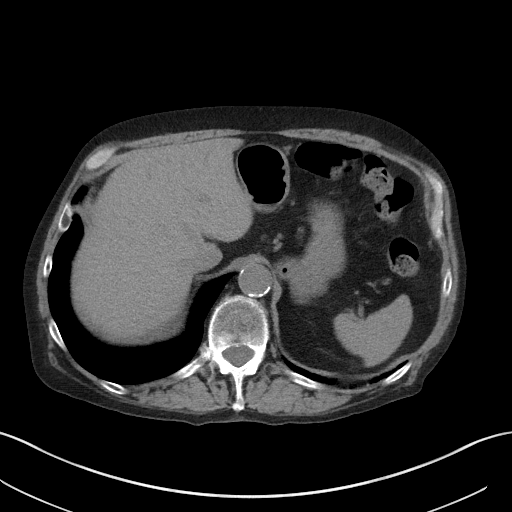
[im 81/98  lung]
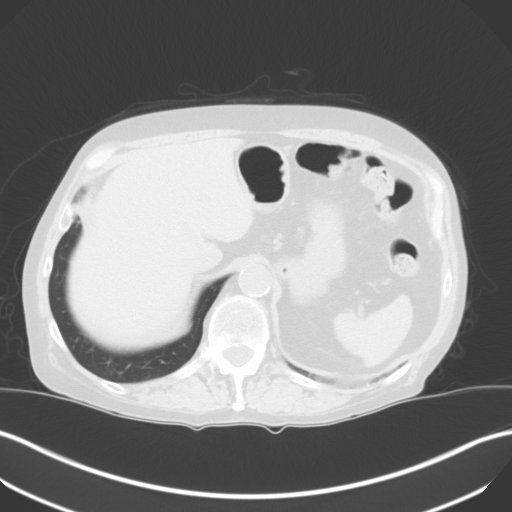

[Series 5: coronal pre · coronal · non-contrast · 0.85mm/px · 2 of 96 slices shown, 3 images]
[im 32/96  soft-tissue]
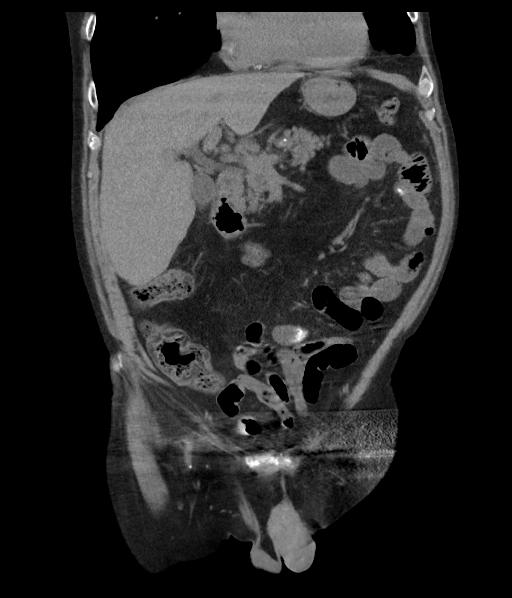
[im 32/96  bone]
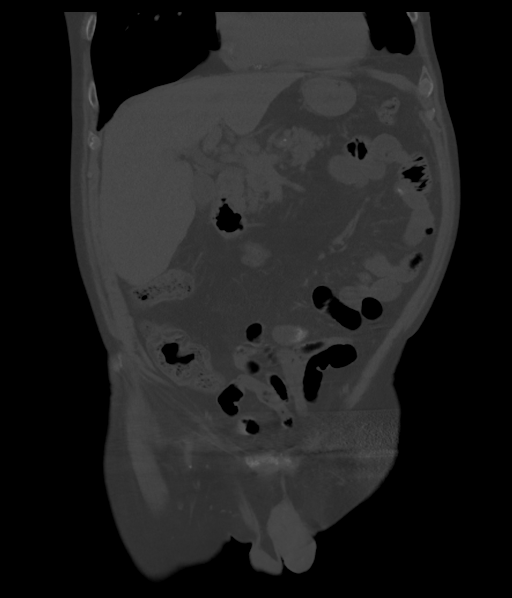
[im 64/96  soft-tissue]
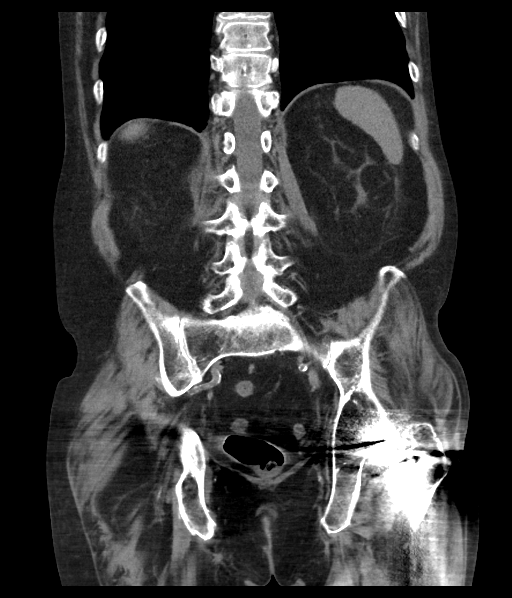

[Series 7: axial post · axial · 0.75mm/px · z∈[-550,-215]mm · 5 of 101 slices shown]
[im 17/101  soft-tissue]
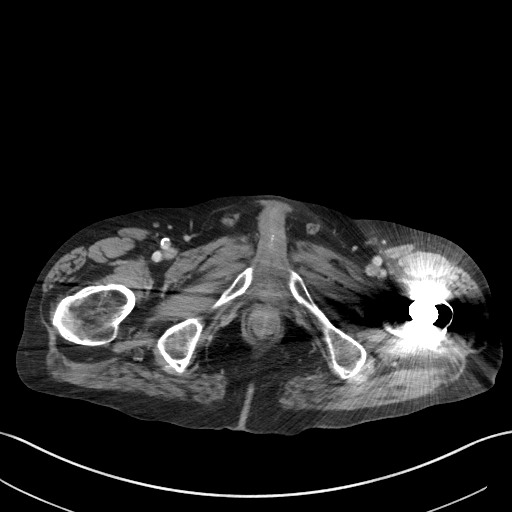
[im 34/101  soft-tissue]
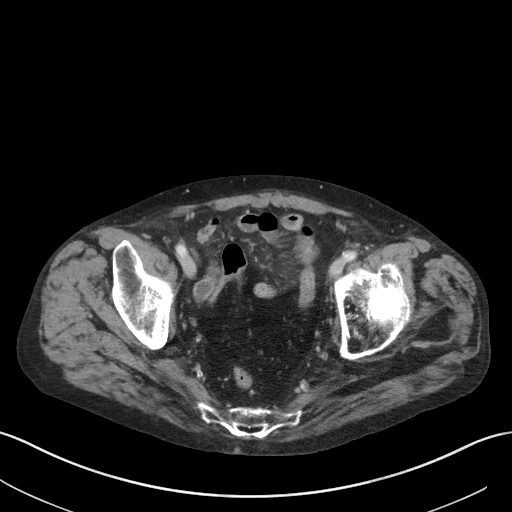
[im 51/101  soft-tissue]
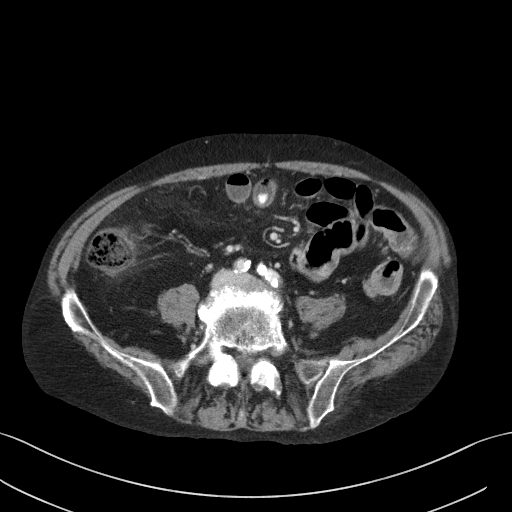
[im 67/101  soft-tissue]
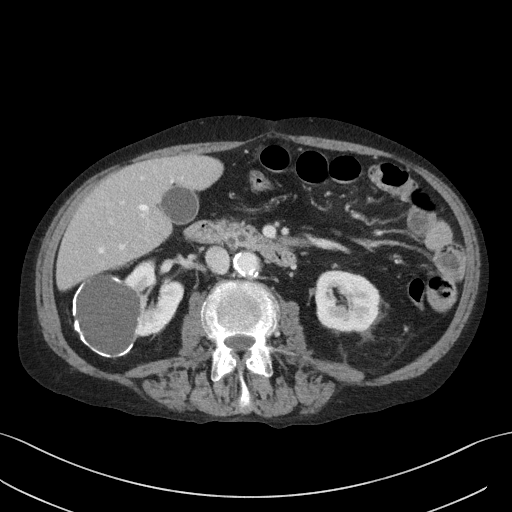
[im 84/101  soft-tissue]
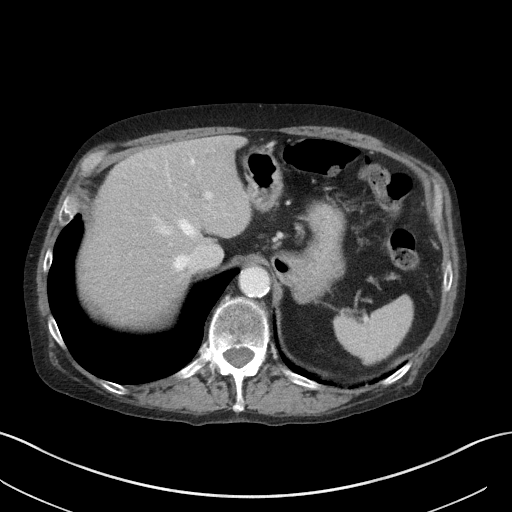

[12 of 46 positions shown; findings below may reference images not displayed]

RADIATION DOSE REDUCTION: This exam was performed according to the
departmental dose-optimization program which includes automated
exposure control, adjustment of the mA and/or kV according to
patient size and/or use of iterative reconstruction technique.

CONTRAST:  100mL OMNIPAQUE IOHEXOL 350 MG/ML SOLN
FINDINGS: Lower chest: Minimal scarring in the lung bases. Heart size normal.
No pericardial or pleural effusion. Atherosclerotic calcification of
the aorta and coronary arteries.

Hepatobiliary: Liver and gallbladder are unremarkable. No biliary
ductal dilatation.

Pancreas: Negative.

Spleen: Negative.

Adrenals/Urinary Tract: Adrenal glands are unremarkable.
Peripherally calcified fluid density mass off the interpolar right
kidney measures 5.5 x 6.4 cm, compatible with a Bosniak 2 cyst.
Additional subcentimeter low-attenuation lesions in the kidneys are
too small to characterize. No specific follow-up necessary. No
urinary stones. Ureters are decompressed. No filling defects in the
intrarenal collecting systems or ureters. Bladder is largely
obscured by streak artifact from a left hip arthroplasty. There may
be ventral bladder wall thickening.

Stomach/Bowel: Stomach, small bowel, appendix and colon are
unremarkable.

Vascular/Lymphatic: Atherosclerotic calcification of the aorta. No
pathologically enlarged lymph nodes.

Reproductive: Prostatectomy.

Other: No free fluid.  Mesenteries and peritoneum are unremarkable.

Musculoskeletal: Left hip arthroplasty. Degenerative changes in the
spine. No worrisome lytic or sclerotic lesions.
IMPRESSION: 1. Bladder is relatively is relatively obscured by streak artifact
from a left hip arthroplasty. There may be ventral bladder wall
thickening. No additional findings to explain the patient's
hematuria.
2. Aortic atherosclerosis ([id number]). Coronary artery
calcification.

## 2024-04-04 NOTE — Progress Notes (Deleted)
 Dan [name], male    DOB: July 20, 1946    MRN: [id number]   Brief patient profile:  89  yowm active smoker  referred to pulmonary clinic in [place]  01/13/2024 by Dr Dan [name]  for doe  x  2023 while on chemo po dytiga for prostate ca.   Cardiac eval by [facility] cards neg but copd on cxr 9/9/924      History of Present Illness  01/13/2024  Pulmonary/ 1st office eval/ Dan [name] / Dan [name] Office trelegy x 3-4 y Chief Complaint  Patient presents with   Consult    Copd   Dyspnea:  also limited by R hip / 2 wheeled walker for hip surgery  Cough: still some rattle / worse in am> mucoid Sleep: flat bed/ one pillow  SABA use: occasionally hfa/ never neb  02:  prn  Rec Plan A = Automatic = Always=    Trelegy 100 one click 1st thing in am  Prednisone  5 mg x 2 each am until surgery  Plan [name] = Backup (to supplement plan A, not to replace it) Only use your albuterol inhaler as a rescue medication  Plan C = Crisis (instead of Plan [name] but only if Plan [name] stops working) - only use your albuterol nebulizer if you first try Plan [name]  Also  Ok to try albuterol  right before you leave home for  surgery and also  15 min before an activity (on alternating days)  that you know would usually make you short of breath  For cough/ congestion > mucinex up to maximum of  1200 mg every 12 hours as needed  Depomedrol 120 mg IM You are cleared for surgery - you should not smoke prior to surgery.    04/08/2024  f/u ov/[place] office/Dan [name] re: *** maint on ***  No chief complaint on file.   Dyspnea:  *** Cough: *** Sleeping: ***   resp cc  SABA use: *** 02: ***  Lung cancer screening: ***   No obvious day to day or daytime variability or assoc excess/ purulent sputum or mucus plugs or hemoptysis or cp or chest tightness, subjective wheeze or overt sinus or hb symptoms.    Also denies any obvious fluctuation of symptoms with weather or environmental changes or other aggravating or alleviating factors except  as outlined above   No unusual exposure hx or h/o childhood pna/ asthma or knowledge of premature birth.  Current Allergies, Complete Past Medical History, Past Surgical History, Family History, and Social History were reviewed in [employer] record.  ROS  The following are not active complaints unless bolded Hoarseness, sore throat, dysphagia, dental problems, itching, sneezing,  nasal congestion or discharge of excess mucus or purulent secretions, ear ache,   fever, chills, sweats, unintended wt loss or wt gain, classically pleuritic or exertional cp,  orthopnea pnd or arm/hand swelling  or leg swelling, presyncope, palpitations, abdominal pain, anorexia, nausea, vomiting, diarrhea  or change in bowel habits or change in bladder habits, change in stools or change in urine, dysuria, hematuria,  rash, arthralgias, visual complaints, headache, numbness, weakness or ataxia or problems with walking or coordination,  change in mood or  memory.        No outpatient medications have been marked as taking for the 04/08/24 encounter (Appointment) with Dan Hegler B, MD.   Current Facility-Administered Medications for the 04/08/24 encounter (Appointment) with Dan Cuoco B, MD  Medication   degarelix  (FIRMAGON ) injection 80 mg  Past Medical History:  Diagnosis Date   BPH (benign prostatic hyperplasia)    COPD (chronic obstructive pulmonary disease) (HCC)    Depression    ED (erectile dysfunction)    Gout    Hyperlipidemia    Hypertension    Loss of hearing    Nocturia    Paroxysmal atrial fibrillation (HCC)    Prostate cancer (HCC)       Objective:    Wts   04/08/2024          ***   03/22/24 144 lb (65.3 kg)  01/20/24 151 lb (68.5 kg)  01/13/24 151 lb (68.5 kg)      Vital signs reviewed  04/08/2024  - Note at rest 02 sats  ***% on ***   General appearance:    ***       Mod barr***      Assessment

## 2024-04-05 DIAGNOSIS — M5416 Radiculopathy, lumbar region: Secondary | ICD-10-CM | POA: Diagnosis not present

## 2024-04-08 ENCOUNTER — Ambulatory Visit: Admitting: Internal Medicine

## 2024-04-12 ENCOUNTER — Ambulatory Visit (INDEPENDENT_AMBULATORY_CARE_PROVIDER_SITE_OTHER)

## 2024-04-12 DIAGNOSIS — C61 Malignant neoplasm of prostate: Secondary | ICD-10-CM | POA: Diagnosis not present

## 2024-04-12 DIAGNOSIS — Z8546 Personal history of malignant neoplasm of prostate: Secondary | ICD-10-CM

## 2024-04-12 NOTE — Progress Notes (Signed)
 Firmagon  Sub Q Injection  Due to Prostate Cancer patient is present today for monthly Firmagon  Injection.  Order received and reviewed and authorization verification reviewed.   Medication: Firmagon  (Degarelix )  Dose: 80mg  Location: left upper abdomen cleaned and prepped with alcohol  prior to injection Lot: [id number] Exp: 11/2025  Patient tolerated well, no complications were noted Band aid applied over injection site.   Performed by: Gorden Latino, CMA  Follow up: 1 month for injection. Appointment scheduled with patient.

## 2024-04-13 ENCOUNTER — Other Ambulatory Visit: Payer: Self-pay | Admitting: *Deleted

## 2024-04-13 NOTE — Telephone Encounter (Signed)
**Note De-identified  [place] Obfuscation** Please advise 

## 2024-04-19 DIAGNOSIS — G609 Hereditary and idiopathic neuropathy, unspecified: Secondary | ICD-10-CM | POA: Diagnosis not present

## 2024-04-19 DIAGNOSIS — I10 Essential (primary) hypertension: Secondary | ICD-10-CM | POA: Diagnosis not present

## 2024-04-19 DIAGNOSIS — I739 Peripheral vascular disease, unspecified: Secondary | ICD-10-CM | POA: Diagnosis not present

## 2024-04-19 DIAGNOSIS — Z125 Encounter for screening for malignant neoplasm of prostate: Secondary | ICD-10-CM | POA: Diagnosis not present

## 2024-04-19 DIAGNOSIS — Z6822 Body mass index (BMI) 22.0-22.9, adult: Secondary | ICD-10-CM | POA: Diagnosis not present

## 2024-04-19 DIAGNOSIS — D519 Vitamin B12 deficiency anemia, unspecified: Secondary | ICD-10-CM | POA: Diagnosis not present

## 2024-04-22 ENCOUNTER — Other Ambulatory Visit: Payer: Self-pay | Admitting: *Deleted

## 2024-04-22 ENCOUNTER — Other Ambulatory Visit: Payer: Self-pay

## 2024-04-26 ENCOUNTER — Other Ambulatory Visit: Payer: Self-pay

## 2024-05-03 ENCOUNTER — Telehealth: Payer: Self-pay | Admitting: *Deleted

## 2024-05-03 ENCOUNTER — Other Ambulatory Visit: Payer: Self-pay | Admitting: *Deleted

## 2024-05-03 DIAGNOSIS — D508 Other iron deficiency anemias: Secondary | ICD-10-CM

## 2024-05-03 DIAGNOSIS — C61 Malignant neoplasm of prostate: Secondary | ICD-10-CM

## 2024-05-03 NOTE — Telephone Encounter (Signed)
**Note De-identified  [place] Obfuscation** Please advise 

## 2024-05-03 NOTE — Telephone Encounter (Signed)
 Wife, Tillman contacted office via [facility] to advise that patient is experiencing symptoms of IDA, to include being cold, and profound fatigue.  Will obtain labs tomorrow and discuss results with Dr. Rogers.

## 2024-05-04 ENCOUNTER — Inpatient Hospital Stay: Attending: Hematology

## 2024-05-04 ENCOUNTER — Inpatient Hospital Stay (HOSPITAL_BASED_OUTPATIENT_CLINIC_OR_DEPARTMENT_OTHER): Admitting: Hematology

## 2024-05-04 VITALS — BP 134/55 | HR 75 | Temp 98.0°F | Resp 20 | Wt 139.8 lb

## 2024-05-04 DIAGNOSIS — C775 Secondary and unspecified malignant neoplasm of intrapelvic lymph nodes: Secondary | ICD-10-CM | POA: Insufficient documentation

## 2024-05-04 DIAGNOSIS — Z9079 Acquired absence of other genital organ(s): Secondary | ICD-10-CM | POA: Insufficient documentation

## 2024-05-04 DIAGNOSIS — Z7901 Long term (current) use of anticoagulants: Secondary | ICD-10-CM | POA: Diagnosis not present

## 2024-05-04 DIAGNOSIS — F17210 Nicotine dependence, cigarettes, uncomplicated: Secondary | ICD-10-CM | POA: Insufficient documentation

## 2024-05-04 DIAGNOSIS — Z79899 Other long term (current) drug therapy: Secondary | ICD-10-CM | POA: Diagnosis not present

## 2024-05-04 DIAGNOSIS — Z7952 Long term (current) use of systemic steroids: Secondary | ICD-10-CM | POA: Insufficient documentation

## 2024-05-04 DIAGNOSIS — C772 Secondary and unspecified malignant neoplasm of intra-abdominal lymph nodes: Secondary | ICD-10-CM

## 2024-05-04 DIAGNOSIS — C61 Malignant neoplasm of prostate: Secondary | ICD-10-CM | POA: Diagnosis present

## 2024-05-04 DIAGNOSIS — E611 Iron deficiency: Secondary | ICD-10-CM | POA: Insufficient documentation

## 2024-05-04 DIAGNOSIS — D508 Other iron deficiency anemias: Secondary | ICD-10-CM

## 2024-05-04 DIAGNOSIS — R221 Localized swelling, mass and lump, neck: Secondary | ICD-10-CM | POA: Diagnosis not present

## 2024-05-04 DIAGNOSIS — D75839 Thrombocytosis, unspecified: Secondary | ICD-10-CM | POA: Insufficient documentation

## 2024-05-04 DIAGNOSIS — I480 Paroxysmal atrial fibrillation: Secondary | ICD-10-CM | POA: Diagnosis not present

## 2024-05-04 LAB — CBC WITH DIFFERENTIAL/PLATELET
Abs Immature Granulocytes: 0.11 10*3/uL — ABNORMAL HIGH (ref 0.00–0.07)
Basophils Absolute: 0 10*3/uL (ref 0.0–0.1)
Basophils Relative: 0 %
Eosinophils Absolute: 0 10*3/uL (ref 0.0–0.5)
Eosinophils Relative: 0 %
HCT: 31.9 % — ABNORMAL LOW (ref 39.0–52.0)
Hemoglobin: 10.5 g/dL — ABNORMAL LOW (ref 13.0–17.0)
Immature Granulocytes: 1 %
Lymphocytes Relative: 10 %
Lymphs Abs: 0.9 10*3/uL (ref 0.7–4.0)
MCH: 30.1 pg (ref 26.0–34.0)
MCHC: 32.9 g/dL (ref 30.0–36.0)
MCV: 91.4 fL (ref 80.0–100.0)
Monocytes Absolute: 0.5 10*3/uL (ref 0.1–1.0)
Monocytes Relative: 5 %
Neutro Abs: 7.8 10*3/uL — ABNORMAL HIGH (ref 1.7–7.7)
Neutrophils Relative %: 84 %
Platelets: 370 10*3/uL (ref 150–400)
RBC: 3.49 MIL/uL — ABNORMAL LOW (ref 4.22–5.81)
RDW: 17.7 % — ABNORMAL HIGH (ref 11.5–15.5)
WBC: 9.4 10*3/uL (ref 4.0–10.5)
nRBC: 0 % (ref 0.0–0.2)

## 2024-05-04 LAB — COMPREHENSIVE METABOLIC PANEL WITH GFR
ALT: 11 U/L (ref 0–44)
AST: 15 U/L (ref 15–41)
Albumin: 3.3 g/dL — ABNORMAL LOW (ref 3.5–5.0)
Alkaline Phosphatase: 63 U/L (ref 38–126)
Anion gap: 12 (ref 5–15)
BUN: 19 mg/dL (ref 8–23)
CO2: 20 mmol/L — ABNORMAL LOW (ref 22–32)
Calcium: 10.2 mg/dL (ref 8.9–10.3)
Chloride: 103 mmol/L (ref 98–111)
Creatinine, Ser: 1.05 mg/dL (ref 0.61–1.24)
GFR, Estimated: >60 mL/min (ref >60)
Glucose, Bld: 115 mg/dL — ABNORMAL HIGH (ref 70–99)
Potassium: 3.7 mmol/L (ref 3.5–5.1)
Sodium: 135 mmol/L (ref 135–145)
Total Bilirubin: 0.6 mg/dL (ref 0.0–1.2)
Total Protein: 6.6 g/dL (ref 6.5–8.1)

## 2024-05-04 LAB — FERRITIN: Ferritin: 131 ng/mL (ref 24–336)

## 2024-05-04 LAB — IRON AND TIBC
Iron: 48 ug/dL (ref 45–182)
Saturation Ratios: 15 % — ABNORMAL LOW (ref 17.9–39.5)
TIBC: 324 ug/dL (ref 250–450)
UIBC: 276 ug/dL

## 2024-05-04 MED ORDER — METHYLPHENIDATE HCL 5 MG PO TABS: 5.0000 mg | ORAL_TABLET | Freq: Two times a day (BID) | ORAL | 0 refills | Status: DC | PRN

## 2024-05-04 NOTE — Progress Notes (Signed)
 [facility] [address] [place] [address]    Clinic Day:  05/04/2024  Referring physician: Toribio Jerel MATSU, MD  Patient Care Team: Toribio Jerel MATSU, MD as PCP - General (Family Medicine) Alvan Dorn FALCON, MD as PCP - Cardiology (Cardiology) Shaaron Lamar HERO, MD as Consulting Physician (Gastroenterology) Rogers Hai, MD as Medical Oncologist (Medical Oncology) Celestia Joesph SQUIBB, RN as Oncology Nurse Navigator (Medical Oncology) Darlean Ozell NOVAK, MD as Consulting Physician (Pulmonary Disease)   ASSESSMENT & PLAN:   Assessment: 1.  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 Hollis on 09/22/2018 - Final pathology: PT3b N0, Gleason 4+3 prostate adenocarcinoma, postoperative PSA remained detectable and rose to 5.7 - Axumin PET (06/2019): Local recurrence - Salvage XRT to the prostate fossa and pelvic lymph nodes, Dr. Harper from 07/05/2019 - 08/31/2019, without concurrent ADT.  PSA responded well reaching a nadir of 0.1. - PSA rose again to 3.4 in 06/2022, PSMA PET scan: Oligometastatic disease involving single internal iliac lymph node. - 07/29/2022 - 08/09/2022: 10 fx course of UHRT to right internal iliac node, PSA only decreased slightly to 3.1 in 10/2022, further went up to 5 on 12/05/2022 - PSMA PET (12/19/2022): New radiotracer avid 2 mm infrarenal retrocaval lymph node and a minimal increase size of the previously treated right common iliac lymph node measuring 3 mm compared to 2 mm previously, no evidence of recurrence in the prostatectomy bed or bone mets. - Loading dose of Firmagon  with Dr. Watt on 01/09/2023 - Abiraterone  and prednisone  started on 01/18/2023. - XRT to the retrocaval lymph node and right common iliac lymph node completed on 01/29/2023, 10 fractions   2.  Social/family history: - He lives with wife at home.  He is independent of all ADLs and IADLs.  Uses cane for longer  distances.  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  Current active smoker, 1 pack/day for 45 years. - Father had lung cancer and was smoker.    Plan: 1.  Metastatic CSPC to abdominal/high pelvic lymph node (low-volume): - He is taking abiraterone  500 mg daily and prednisone  5 mg daily.  He also receives monthly Firmagon  injections with Dr. Watt. - He complained of severe fatigue.  I have recommended that he stop Zytiga .  He stopped taking Zytiga  for 7 days and felt worse and then he started back.  Last Firmagon  was on 04/12/2024. - Patient and his wife report that he is having a lot of fatigue.  He took Ritalin  5 mg tablet for fatigue and reports that it worked a little bit but not much. - He would like to continue Zytiga  at this time.  Continue at the same dose.  We will repeat PSA at next visit. - For cancer-related fatigue, I will give prescription for Ritalin  5 mg twice daily with breakfast and lunch as needed. - If there is no improvement in quality of life, we may consider stopping active therapy for his prostate cancer.  I will discuss it further at next visit.   2.  Iron  deficiency state: - Last INFeD  was on 12/02/2023.  Latest ferritin is 131 with saturation of 15.  He reports extreme fatigue.  I will give him 1 more infusion of INFeD .   3.  Mild hypercalcemia: - He has mild intermittent hypercalcemia.  He is not on calcium supplements.   4.  Osteoporosis (  DEXA on 03/17/2023 T score -4): - Received 1 dose of Prolia  on 04/03/2023 and decided not to continue it.  5.  Right neck mass: - He has noticed right neck swelling for the past 3 months. 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  No cervical adenopathy. - He has seen Dr. Carlie and decided to watch and wait approach.   6.  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    No orders of the defined types were placed in this encounter.     LILLETTE Verneta SAUNDERS Teague,acting as a Neurosurgeon for Alean Stands, MD.,have documented all relevant documentation on the behalf of Alean Stands, MD,as directed by  Alean Stands, MD while in the presence of Alean Stands, MD.  I, Alean Stands MD, have reviewed the above documentation for accuracy and completeness, and I agree with the above.     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 RALP 09/22/18 (Dr. Hollis) 2. Salvage XRT + ADT 07/05/2019 - 08/31/2019 (Dr. Harper) 3. UHRT to right internal iliac node 07/29/22 - 08/09/22 4. UHRT to infrarenal retrocaval lymph node 01/16/23 - 01/29/23  Current Therapy:  Firmagon  and Abiraterone     HISTORY OF PRESENT ILLNESS:   Oncology History   No history exists.     INTERVAL HISTORY:   Dan [name] is a 78 y.o. 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 He was last seen by me on 03/22/24.  Today, he states that he is doing well overall. His appetite level is at 75%. His energy level is at 25%. He is accompanied by his wife.   His wife reports Rashon has fatigue, feels cold, weakness, and difficulty walking. Yaiden has taken Ritalin  daily with minimal improvement of fatigue. IV iron  from January 2025 improved energy levels temporarily. He notes a fall a week ago off of a ladder. Tasean temporarily discontinued Zytiga  for 1  week from 04/13/24 to 04/20/24 and states symptoms worsened, before restarting the medication. He is taking Firmagon  monthly.   He has been seen by neurology and was started on gabapentin. He also saw Dr. Carlie since his last visit who reportedly said his right neck mass is benign and he can have it removed at any time.   PAST MEDICAL HISTORY:   Past Medical History: Past Medical History:  Diagnosis Date   Anemia    Arthritis    Asthma    BPH (benign prostatic hyperplasia)    COPD (chronic obstructive pulmonary disease) (HCC)    Depression    ED (erectile dysfunction)    Gout    Hyperlipidemia    Hypertension    Loss of hearing    Nocturia    Paroxysmal atrial fibrillation (HCC)    Prostate cancer [facility])     Surgical History: Past Surgical History:  Procedure Laterality Date   CARDIOVASCULAR STRESS TEST  12/31/2004   normal perfuction nuclear study w/ no evidence ischemia /  normal LV function  and wall motion , ef 52%   PROSTATECTOMY  2019   TOTAL HIP ARTHROPLASTY Left    TOTAL HIP ARTHROPLASTY Right 01/20/2024   Procedure: ARTHROPLASTY, HIP, TOTAL, ANTERIOR APPROACH;  Surgeon: Ernie Cough, MD;  Location: [facility];  Service: Orthopedics;  Laterality: Right;    Social History: Social History   Socioeconomic History   Marital status: Married    Spouse name: Not on file   Number of children: Not on file   Years of education: Not on file   Highest education level: Not on file  Occupational History   Not on file  Tobacco Use   Smoking status: Every Day    Current packs/day: 0.50    Average packs/day: 0.5 packs/day for 56.9 years (28.4 ttl pk-yrs)    Types: Cigarettes    Start date: 06/12/1967    Passive exposure: Never   Smokeless tobacco: Never  Vaping Use   Vaping status: Never Used  Substance and Sexual Activity   Alcohol  use: Not Currently   Drug use: Not Currently   Sexual activity: Not Currently  Other Topics Concern   Not on file  Social History Narrative    Not on file   Social Drivers of Health   Financial Resource Strain: Not on file  Food Insecurity: No Food Insecurity (01/20/2024)   Hunger Vital Sign    Worried About Running Out of Food in the Last Year: Never true    Ran Out of Food in the Last Year: Never true  Transportation Needs: No Transportation Needs (01/20/2024)   PRAPARE - Administrator, Civil Service (Medical): No    Lack of Transportation (Non-Medical): No  Physical Activity: Not on file  Stress: Not on file  Social Connections: Unknown (01/20/2024)   Social Connection and Isolation Panel    Frequency of Communication with Friends and Family: Patient declined    Frequency of Social Gatherings with Friends and Family: Patient declined    Attends Religious Services: Patient declined    Database administrator or Organizations: Patient declined    Attends Banker Meetings: Patient declined    Marital Status: Married  Catering manager Violence: Not At Risk (01/20/2024)   Humiliation, Afraid, Rape, and Kick questionnaire    Fear of Current or Ex-Partner: No    Emotionally Abused: No    Physically Abused: No    Sexually Abused: No    Family History: Family History  Problem Relation Age of Onset   Hypertension Mother    Cancer Father        lung   Colon cancer Neg Hx    Inflammatory bowel disease Neg Hx    Pancreatic cancer Neg Hx     Current Medications:  Current Outpatient Medications:    abiraterone  acetate (ZYTIGA ) 250 MG tablet, Take 4 tablets (1,000 mg total) by mouth daily. Take on an empty stomach 1 hour before or 2 hours after a meal (Patient taking differently: Take 500 mg by mouth daily. Take on an empty stomach 1 hour before or 2 hours after a meal), Disp: 120 tablet, Rfl: 3   allopurinol  (ZYLOPRIM ) 100 MG tablet, Take 100 mg by mouth daily., Disp: , Rfl:    Ascorbic Acid (VITAMIN C) 1000 MG tablet, Take 1,000 mg by mouth daily., Disp: , Rfl:    cholecalciferol (VITAMIN D3) 25 MCG  (1000 UNIT) tablet, Take 1,000 Units by mouth daily., Disp: , Rfl:    diltiazem  (CARDIZEM  CD) 360 MG 24 hr  capsule, Take 360 mg by mouth daily., Disp: , Rfl:    HYDROcodone -acetaminophen  (NORCO) 10-325 MG tablet, Take 1 tablet by mouth every 6 (six) hours as needed., Disp: 30 tablet, Rfl: 0   losartan  (COZAAR ) 25 MG tablet, TAKE 1 TABLET (25 MG TOTAL) BY MOUTH DAILY., Disp: 90 tablet, Rfl: 1   magnesium  oxide (MAG-OX) 400 (240 Mg) MG tablet, TAKE 1 TABLET BY MOUTH EVERY DAY, Disp: 90 tablet, Rfl: 1   megestrol  (MEGACE ) 40 MG/ML suspension, Take 400 mg by mouth daily as needed (appetite)., Disp: , Rfl:    methocarbamol  (ROBAXIN ) 500 MG tablet, Take 1 tablet (500 mg total) by mouth every 6 (six) hours as needed for muscle spasms., Disp: 40 tablet, Rfl: 2   mirtazapine (REMERON) 30 MG tablet, Take 30 mg by mouth at bedtime., Disp: , Rfl:    polyethylene glycol (MIRALAX  / GLYCOLAX ) 17 g packet, Take 17 g by mouth 2 (two) times daily., Disp: 14 each, Rfl: 0   potassium chloride  (MICRO-K ) 10 MEQ CR capsule, TAKE 2 CAPSULES BY MOUTH DAILY., Disp: 7 capsule, Rfl: 0   predniSONE  (DELTASONE ) 5 MG tablet, Take 1 tablet (5 mg total) by mouth daily with breakfast., Disp: 90 tablet, Rfl: 1   rivaroxaban  (XARELTO ) 10 MG TABS tablet, Take 1 tablet (10 mg total) by mouth daily with breakfast for 21 days., Disp: 21 tablet, Rfl: 0   TRELEGY ELLIPTA 200-62.5-25 MCG/ACT AEPB, Inhale 1 puff into the lungs daily., Disp: , Rfl:    Zinc 20 MG CAPS, Take 40 mg by mouth daily., Disp: , Rfl:    methylphenidate  (RITALIN ) 5 MG tablet, Take 1 tablet (5 mg total) by mouth 2 (two) times daily as needed. Take 1 tab with breakfast and 1 tab with lunch as needed, Disp: 60 tablet, Rfl: 0  Current Facility-Administered Medications:    degarelix  (FIRMAGON ) injection 80 mg, 80 mg, Subcutaneous, Q28 days, Wrenn, John, MD, 80 mg at 04/12/24 9076   Allergies: Allergies  Allergen Reactions   Tamsulosin Hives and Itching    REVIEW OF  SYSTEMS:   Review of Systems  Constitutional:  Positive for fatigue. Negative for chills and fever.  HENT:   Negative for lump/mass, mouth sores, nosebleeds, sore throat and trouble swallowing.   Eyes:  Negative for eye problems.  Respiratory:  Positive for shortness of breath (with exertion). Negative for cough.   Cardiovascular:  Positive for leg swelling. Negative for chest pain and palpitations.  Gastrointestinal:  Positive for constipation (occasional). Negative for abdominal pain, diarrhea, nausea and vomiting.  Genitourinary:  Negative for bladder incontinence, difficulty urinating, dysuria, frequency, hematuria and nocturia.   Musculoskeletal:  Negative for arthralgias, back pain, flank pain, myalgias and neck pain.  Skin:  Negative for itching and rash.  Neurological:  Positive for numbness (and tingling in left foot). Negative for dizziness and headaches.  Hematological:  Does not bruise/bleed easily.  Psychiatric/Behavioral:  Positive for sleep disturbance. Negative for depression and suicidal ideas. The patient is not nervous/anxious.   All other systems reviewed and are negative.    VITALS:   Blood pressure (!) 134/55, pulse 75, temperature 98 F (36.7 C), temperature source Tympanic, resp. rate 20, weight 139 lb 12.4 oz (63.4 kg), SpO2 100%.  Wt Readings from Last 3 Encounters:  05/04/24 139 lb 12.4 oz (63.4 kg)  03/22/24 144 lb (65.3 kg)  01/20/24 151 lb (68.5 kg)    Body mass index is 20.06 kg/m.  Performance status (ECOG): 1 - Symptomatic but completely  ambulatory  PHYSICAL EXAM:   Physical Exam Vitals and nursing note reviewed. Exam conducted with a chaperone present.  Constitutional:      Appearance: Normal appearance.   Cardiovascular:     Rate and Rhythm: Normal rate and regular rhythm.     Pulses: Normal pulses.     Heart sounds: Normal heart sounds.  Pulmonary:     Effort: Pulmonary effort is normal.     Breath sounds: Normal breath sounds.   Abdominal:     Palpations: Abdomen is soft. There is no hepatomegaly, splenomegaly or mass.     Tenderness: There is no abdominal tenderness.   Musculoskeletal:     Right lower leg: No edema.     Left lower leg: No edema.  Lymphadenopathy:     Cervical: No cervical adenopathy.     Right cervical: No superficial, deep or posterior cervical adenopathy.    Left cervical: No superficial, deep or posterior cervical adenopathy.     Upper Body:     Right upper body: No supraclavicular or axillary adenopathy.     Left upper body: No supraclavicular or axillary adenopathy.   Neurological:     General: No focal deficit present.     Mental Status: He is alert and oriented to person, place, and time.   Psychiatric:        Mood and Affect: Mood normal.        Behavior: Behavior normal.     LABS:      Latest Ref Rng & Units 05/04/2024    2:46 PM 03/02/2024   10:49 AM 01/21/2024    3:41 AM  CBC  WBC 4.0 - 10.5 K/uL 9.4  6.9  21.0   Hemoglobin 13.0 - 17.0 g/dL 89.4  89.1  8.9   Hematocrit 39.0 - 52.0 % 31.9  34.1  27.4   Platelets 150 - 400 K/uL 370  393  327       Latest Ref Rng & Units 05/04/2024    2:46 PM 03/02/2024   10:49 AM 01/21/2024    3:41 AM  CMP  Glucose 70 - 99 mg/dL 884  889  865   BUN 8 - 23 mg/dL 19  12  37   Creatinine 0.61 - 1.24 mg/dL 8.94  8.90  8.74   Sodium 135 - 145 mmol/L 135  130  134   Potassium 3.5 - 5.1 mmol/L 3.7  3.9  4.4   Chloride 98 - 111 mmol/L 103  95  106   CO2 22 - 32 mmol/L 20  24  20    Calcium 8.9 - 10.3 mg/dL 89.7  89.7  89.6   Total Protein 6.5 - 8.1 g/dL 6.6  6.3    Total Bilirubin 0.0 - 1.2 mg/dL 0.6  0.5    Alkaline Phos 38 - 126 U/L 63  83    AST 15 - 41 U/L 15  13    ALT 0 - 44 U/L 11  10       No results found for: CEA1, CEA / No results found for: CEA1, CEA Lab Results  Component Value Date   PSA1 <0.1 08/14/2023   No results found for: CAN199 No results found for: CAN125  No results found for: STEPHANY RINGS, A1GS, A2GS, EARLA BABCOCK, GAMS, MSPIKE, SPEI Lab Results  Component Value Date   TIBC 324 05/04/2024   TIBC 256 03/02/2024   TIBC 340 10/13/2023   FERRITIN 131 05/04/2024   FERRITIN 213 03/02/2024  FERRITIN 44 10/13/2023   IRONPCTSAT 15 (L) 05/04/2024   IRONPCTSAT 13 (L) 03/02/2024   IRONPCTSAT 18 10/13/2023   Lab Results  Component Value Date   LDH 99 12/03/2023     STUDIES:   No results found.

## 2024-05-05 ENCOUNTER — Other Ambulatory Visit: Payer: Self-pay

## 2024-05-05 ENCOUNTER — Other Ambulatory Visit: Payer: Self-pay | Admitting: Pharmacy Technician

## 2024-05-05 NOTE — Progress Notes (Signed)
 Specialty Pharmacy Ongoing Clinical Assessment Note  Dan [name] is a 78 y.o. male who is being followed by the specialty pharmacy service for RxSp Oncology   Patient's specialty medication(s) reviewed today: Abiraterone  Acetate (ZYTIGA )   Missed doses in the last 4 weeks: 0   Patient/Caregiver did not have any additional questions or concerns.   Therapeutic benefit summary: Patient is achieving benefit   Adverse events/side effects summary: No adverse events/side effects   Patient's therapy is appropriate to: Continue    Goals Addressed             This Visit's Progress    Stabilization of disease   On track    Patient is on track. Patient will maintain adherence. Patient's PSA as of 03/02/24 was <0.1 ng/mL         Follow up: 6 months  [facility] Specialty Pharmacist

## 2024-05-05 NOTE — Progress Notes (Signed)
 Specialty Pharmacy Refill Coordination Note  Dan [name] is a 78 y.o. male contacted today regarding refills of specialty medication(s) Abiraterone  Acetate (ZYTIGA )   Patient requested Delivery   Delivery date: 05/13/24   Verified address: [address] [place]  [address]   Medication will be filled on 05/13/24.  Spoke to spouse.

## 2024-05-06 ENCOUNTER — Ambulatory Visit

## 2024-05-06 VITALS — BP 144/57 | HR 65 | Temp 98.6°F | Resp 16

## 2024-05-06 DIAGNOSIS — E611 Iron deficiency: Secondary | ICD-10-CM | POA: Diagnosis not present

## 2024-05-06 DIAGNOSIS — M810 Age-related osteoporosis without current pathological fracture: Secondary | ICD-10-CM

## 2024-05-06 DIAGNOSIS — M150 Primary generalized (osteo)arthritis: Secondary | ICD-10-CM

## 2024-05-06 MED ORDER — FAMOTIDINE IN NACL 20-0.9 MG/50ML-% IV SOLN
20.0000 mg | Freq: Once | INTRAVENOUS | Status: AC
  Administered 2024-05-06: 20 mg via INTRAVENOUS
  Filled 2024-05-06: qty 50

## 2024-05-06 MED ORDER — SODIUM CHLORIDE 0.9 % IV SOLN
1000.0000 mg | Freq: Once | INTRAVENOUS | Status: AC
  Administered 2024-05-06: 1000 mg via INTRAVENOUS
  Filled 2024-05-06: qty 20

## 2024-05-06 MED ORDER — SODIUM CHLORIDE 0.9 % IV SOLN
INTRAVENOUS | Status: DC
Start: 2024-05-06 — End: 2024-05-06

## 2024-05-06 MED ORDER — ACETAMINOPHEN 325 MG PO TABS
650.0000 mg | ORAL_TABLET | Freq: Once | ORAL | Status: AC
  Administered 2024-05-06: 650 mg via ORAL
  Filled 2024-05-06: qty 2

## 2024-05-06 MED ORDER — METHYLPREDNISOLONE SODIUM SUCC 125 MG IJ SOLR
125.0000 mg | Freq: Once | INTRAMUSCULAR | Status: AC
  Administered 2024-05-06: 125 mg via INTRAVENOUS
  Filled 2024-05-06: qty 2

## 2024-05-06 MED ORDER — CETIRIZINE HCL 10 MG/ML IV SOLN
10.0000 mg | Freq: Once | INTRAVENOUS | Status: AC
  Administered 2024-05-06: 10 mg via INTRAVENOUS
  Filled 2024-05-06: qty 1

## 2024-05-06 NOTE — Patient Instructions (Signed)
 [facility] [place] - A DEPT OF [facility]  Discharge Instructions: Thank you for choosing [facility] to provide your oncology and hematology care.  If you have a lab appointment with the Cancer Center - please note that after April 8th, 2024, all labs will be drawn in the cancer center.  You do not have to check in or register with the main entrance as you have in the past but will complete your check-in in the cancer center.  Wear comfortable clothing and clothing appropriate for easy access to any Portacath or PICC line.   We strive to give you quality time with your provider. You may need to reschedule your appointment if you arrive late (15 or more minutes).  Arriving late affects you and other patients whose appointments are after yours.  Also, if you miss three or more appointments without notifying the office, you may be dismissed from the clinic at the provider's discretion.      For prescription refill requests, have your pharmacy contact our office and allow 72 hours for refills to be completed.    Today you received the following chemotherapy and/or immunotherapy agents Infed      To help prevent nausea and vomiting after your treatment, we encourage you to take your nausea medication as directed.  BELOW ARE SYMPTOMS THAT SHOULD BE REPORTED IMMEDIATELY: *FEVER GREATER THAN 100.4 F (38 C) OR HIGHER *CHILLS OR SWEATING *NAUSEA AND VOMITING THAT IS NOT CONTROLLED WITH YOUR NAUSEA MEDICATION *UNUSUAL SHORTNESS OF BREATH *UNUSUAL BRUISING OR BLEEDING *URINARY PROBLEMS (pain or burning when urinating, or frequent urination) *BOWEL PROBLEMS (unusual diarrhea, constipation, pain near the anus) TENDERNESS IN MOUTH AND THROAT WITH OR WITHOUT PRESENCE OF ULCERS (sore throat, sores in mouth, or a toothache) UNUSUAL RASH, SWELLING OR PAIN  UNUSUAL VAGINAL DISCHARGE OR ITCHING   Items with * indicate a potential emergency and should be followed up as  soon as possible or go to the Emergency Department if any problems should occur.  Please show the CHEMOTHERAPY ALERT CARD or IMMUNOTHERAPY ALERT CARD at check-in to the Emergency Department and triage nurse.  Should you have questions after your visit or need to cancel or reschedule your appointment, please contact [facility] [place] - A DEPT OF Eligh[facility] [phone]  and follow the prompts.  Office hours are 8:00 a.m. to 4:30 p.m. Monday - Friday. Please note that voicemails left after 4:00 p.m. may not be returned until the following business day.  We are closed weekends and major holidays. You have access to a nurse at all times for urgent questions. Please call the main number to the clinic [phone] and follow the prompts.  For any non-urgent questions, you may also contact your provider using MyChart. We now offer e-Visits for anyone 6 and older to request care online for non-urgent symptoms. For details visit [email or web address].   Also download the MyChart app! Go to the app store, search "MyChart", open the app, select [place], and log in with your MyChart username and password.

## 2024-05-06 NOTE — Progress Notes (Signed)
 Patient presents today for Infed infusion per providers order.  Vital signs WNL.  Patient has no new complaints at this time.  Peripheral IV started and blood return noted pre and post infusion.  Stable during infusion without adverse affects.  Vital signs stable.  No complaints at this time.  Discharge from clinic ambulatory in stable condition.  Alert and oriented X 3.  Follow up with [facility] as scheduled.

## 2024-05-12 ENCOUNTER — Ambulatory Visit: Admitting: Orthopaedic Surgery

## 2024-05-12 ENCOUNTER — Other Ambulatory Visit (INDEPENDENT_AMBULATORY_CARE_PROVIDER_SITE_OTHER)

## 2024-05-12 ENCOUNTER — Encounter: Payer: Self-pay | Admitting: Orthopaedic Surgery

## 2024-05-12 VITALS — BP 124/65 | HR 88 | Ht 70.0 in | Wt 140.0 lb

## 2024-05-12 DIAGNOSIS — M19011 Primary osteoarthritis, right shoulder: Secondary | ICD-10-CM

## 2024-05-12 DIAGNOSIS — M879 Osteonecrosis, unspecified: Secondary | ICD-10-CM | POA: Insufficient documentation

## 2024-05-12 DIAGNOSIS — M25511 Pain in right shoulder: Secondary | ICD-10-CM

## 2024-05-12 NOTE — Progress Notes (Signed)
 I hurt my right shoulder.  He fell about a month ago on his right shoulder.  He has had pain since then and decreased motion.  His wife brings him in, reluctantly on his part.  He has marked decreased ROM of the right shoulder and pain.  He has no swelling, no redness.  NV intact.  ROM of the neck is full.  He uses a cane.  X-rays were done of the right shoulder, reported separately.  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  Encounter Diagnosis  Name Primary?   Acute pain of right shoulder Yes   He declines injection.  He declines to see Dr. Onesimo about a total shoulder.  He says he just wanted to make sure he did not have a fracture.  He wants no return appointment but will call if needed.  His wife is present and understands what the patient has decided.  Call if any problem.  Precautions discussed.  Electronically Signed Lemond Stable, MD 7/9/202510:10 AM

## 2024-05-12 NOTE — Patient Instructions (Signed)
 Consider seeing Dr. Onesimo for shoulder pain.

## 2024-05-13 ENCOUNTER — Other Ambulatory Visit: Payer: Self-pay

## 2024-05-13 ENCOUNTER — Ambulatory Visit

## 2024-05-13 DIAGNOSIS — C61 Malignant neoplasm of prostate: Secondary | ICD-10-CM

## 2024-05-13 DIAGNOSIS — C775 Secondary and unspecified malignant neoplasm of intrapelvic lymph nodes: Secondary | ICD-10-CM | POA: Diagnosis not present

## 2024-05-13 NOTE — Progress Notes (Signed)
 Firmagon  Sub Q Injection  Due to Prostate Cancer patient is present today for monthly Firmagon  Injection.  Order received and reviewed and authorization verification reviewed.   Medication: Firmagon  (Degarelix )  Dose: 80mg  Location: right upper abdomen cleaned and prepped with alcohol prior to injection Lot: [id number] Exp: 12/05/2025  Patient tolerated well, no complications were noted Band aid applied over injection site.   Performed by: Exie DASEN. CMA  Follow up: 1 month for injection. Appointment scheduled with patient.

## 2024-05-17 ENCOUNTER — Ambulatory Visit: Admitting: Internal Medicine

## 2024-05-17 ENCOUNTER — Encounter: Payer: Self-pay | Admitting: Internal Medicine

## 2024-05-17 VITALS — BP 110/56 | HR 101 | Ht 70.0 in | Wt 138.0 lb

## 2024-05-17 DIAGNOSIS — F17210 Nicotine dependence, cigarettes, uncomplicated: Secondary | ICD-10-CM

## 2024-05-17 DIAGNOSIS — J449 Chronic obstructive pulmonary disease, unspecified: Secondary | ICD-10-CM | POA: Diagnosis not present

## 2024-05-17 MED ORDER — BREZTRI AEROSPHERE 160-9-4.8 MCG/ACT IN AERO: 2.0000 | INHALATION_SPRAY | Freq: Two times a day (BID) | RESPIRATORY_TRACT | Status: AC

## 2024-05-17 NOTE — Assessment & Plan Note (Signed)
 Counseled re importance of smoking cessation but did not meet time criteria for separate billing     Each maintenance medication was reviewed in detail including emphasizing most importantly the difference between maintenance and prns and under what circumstances the prns are to be triggered using an action plan format where appropriate.  Total time for H and P, chart review, counseling, reviewing hfa/ dpi/neb device(s) , directly observing portions of ambulatory 02 saturation study/ and generating customized AVS unique to this office visit / same day charting = 44 min

## 2024-05-17 NOTE — Progress Notes (Signed)
 Dan [name], male    DOB: August 21, 1946    MRN: [id number]   Brief patient profile:  89  yowm active smoker  referred to pulmonary clinic in [place]  01/13/2024 by Dr Rogers  for doe  x  2023 while on chemo po dytiga for prostate ca.   Cardiac eval by [facility] cards neg but copd on cxr 9/9/924      History of Present Illness  01/13/2024  Pulmonary/ 1st office eval/ Nikitas Davtyan / [place] [facility] trelegy x 3-4 y Chief Complaint  Patient presents with   Consult    Copd   Dyspnea:  also limited by R hip / 2 wheeled walker for hip surgery  Cough: still some rattle / worse in am> mucoid Sleep: flat bed/ one pillow  SABA use: occasionally hfa/ never neb  02:  prn  Rec Plan A = Automatic = Always=    Trelegy 100 one click 1st thing in am  Prednisone  5 mg x 2 each am until surgery  Plan B = Backup (to supplement plan A, not to replace it) Only use your albuterol inhaler as a rescue medication  Plan C = Crisis (instead of Plan B but only if Plan B stops working) - only use your albuterol nebulizer if you first try Plan B  Also  Ok to try albuterol  right before you leave home for  surgery and also  15 min before an activity For cough/ congestion > mucinex up to maximum of  1200 mg every 12 hours as needed  Depomedrol 120 mg IM You are cleared for surgery - you should not smoker prior to surgery.    05/17/2024  f/u ov/[facility] office/Randal Goens re: doe  maint on trelegy 100 / prednisone  5 mg with chemo   Chief Complaint  Patient presents with   Follow-up    Sob is worse, walking short distances. Cough is productive (yellow)  Dyspnea:  using cane around flat surface  Cough: mostly in am slt yellow  Sleeping: flat bed one pillow s  resp cc  SABA use: tid hfa/ very rarely neb when I feel like it  02: none   Lung cancer screening: referred 05/17/2024    No obvious day to day or daytime variability or assoc  mucus plugs or hemoptysis or cp or chest tightness, subjective wheeze or overt  sinus or hb symptoms.    Also denies any obvious fluctuation of symptoms with weather or environmental changes or other aggravating or alleviating factors except as outlined above   No unusual exposure hx or h/o childhood pna/ asthma or knowledge of premature birth.  Current Allergies, Complete Past Medical History, Past Surgical History, Family History, and Social History were reviewed in [employer] record.  ROS  The following are not active complaints unless bolded Hoarseness, sore throat, dysphagia, dental problems, itching, sneezing,  nasal congestion or discharge of excess mucus or purulent secretions, ear ache,   fever, chills, sweats, unintended wt loss or wt gain, classically pleuritic or exertional cp,  orthopnea pnd or arm/hand swelling  or leg swelling, presyncope, palpitations, abdominal pain, anorexia, nausea, vomiting, diarrhea  or change in bowel habits or change in bladder habits, change in stools or change in urine, dysuria, hematuria,  rash, arthralgias, visual complaints, headache, numbness, weakness or ataxia or problems with walking or coordination,  change in mood or  memory.        Current Meds  Medication Sig   abiraterone  acetate (ZYTIGA ) 250 MG  tablet Take 4 tablets (1,000 mg total) by mouth daily. Take on an empty stomach 1 hour before or 2 hours after a meal (Patient taking differently: Take 500 mg by mouth daily. Take on an empty stomach 1 hour before or 2 hours after a meal)   allopurinol  (ZYLOPRIM ) 100 MG tablet Take 100 mg by mouth daily.   Ascorbic Acid (VITAMIN C) 1000 MG tablet Take 1,000 mg by mouth daily.   cholecalciferol (VITAMIN D3) 25 MCG (1000 UNIT) tablet Take 1,000 Units by mouth daily.   diltiazem  (CARDIZEM  CD) 360 MG 24 hr capsule Take 360 mg by mouth daily.   HYDROcodone -acetaminophen  (NORCO) 10-325 MG tablet Take 1 tablet by mouth every 6 (six) hours as needed.   losartan  (COZAAR ) 25 MG tablet TAKE 1 TABLET (25 MG TOTAL) BY  MOUTH DAILY.   magnesium  oxide (MAG-OX) 400 (240 Mg) MG tablet TAKE 1 TABLET BY MOUTH EVERY DAY   megestrol  (MEGACE ) 40 MG/ML suspension Take 400 mg by mouth daily as needed (appetite).   methocarbamol  (ROBAXIN ) 500 MG tablet Take 1 tablet (500 mg total) by mouth every 6 (six) hours as needed for muscle spasms.   methylphenidate  (RITALIN ) 5 MG tablet Take 1 tablet (5 mg total) by mouth 2 (two) times daily as needed. Take 1 tab with breakfast and 1 tab with lunch as needed   mirtazapine (REMERON) 30 MG tablet Take 30 mg by mouth at bedtime.   polyethylene glycol (MIRALAX  / GLYCOLAX ) 17 g packet Take 17 g by mouth 2 (two) times daily.   potassium chloride  (MICRO-K ) 10 MEQ CR capsule TAKE 2 CAPSULES BY MOUTH DAILY.   predniSONE  (DELTASONE ) 5 MG tablet Take 1 tablet (5 mg total) by mouth daily with breakfast.   TRELEGY ELLIPTA 200-62.5-25 MCG/ACT AEPB Inhale 1 puff into the lungs daily.   Zinc 20 MG CAPS Take 40 mg by mouth daily.   Current Facility-Administered Medications for the 05/17/24 encounter (Office Visit) with Darlean Ozell NOVAK, MD  Medication   degarelix  (FIRMAGON ) injection 80 mg           Past Medical History:  Diagnosis Date   BPH (benign prostatic hyperplasia)    COPD (chronic obstructive pulmonary disease) (HCC)    Depression    ED (erectile dysfunction)    Gout    Hyperlipidemia    Hypertension    Loss of hearing    Nocturia    Paroxysmal atrial fibrillation (HCC)    Prostate cancer (HCC)       Objective:   Wts  05/17/2024        138   05/12/24 140 lb (63.5 kg)  05/04/24 139 lb 12.4 oz (63.4 kg)  03/22/24 144 lb (65.3 kg)    Vital signs reviewed  05/17/2024  - Note at rest 02 sats  99% on RA   General appearance:   w/c bound stoic wm nad / very poor hearing   HEENT :  Oropharynx  clear       NECK :  without JVD/Nodes/TM/ nl carotid upstrokes bilaterally   LUNGS: no acc muscle use,  Mod barrel  contour chest wall with bilateral  minimal distant insp/exp  rhonchi and  without cough on insp or exp maneuvers and mod  Hyperresonant  to  percussion bilaterally     CV:  RRR  no s3 or murmur or increase in P2, and no edema   ABD:  soft and nontender with pos mid insp Hoover's  in the supine position. No  bruits or organomegaly appreciated, bowel sounds nl  MS:   Ext warm without deformities or   obvious joint restrictions , calf tenderness, cyanosis or clubbing  SKIN: warm and dry without lesions    NEURO:  alert, approp, nl sensorium with  no motor or cerebellar deficits apparent.              Assessment

## 2024-05-17 NOTE — Assessment & Plan Note (Signed)
 Active smoker   - 01/13/2024  After extensive coaching inhaler device,  effectiveness =    75% with DPI  - 05/17/2024  After extensive coaching inhaler device,  effectiveness =    75% with hfa (short Ti)  - 05/17/2024   Walked on RA   x  1.5   lap(s) =  approx 225  ft  @ slow  pace, stopped due to tired > sob with lowest 02 sats 97%

## 2024-05-17 NOTE — Patient Instructions (Addendum)
 Plan A = Automatic = Always=    Breztri   Take 2 puffs first thing in am and then another 2 puffs about 12 hours later.   Work on inhaler technique:  relax and gently blow all the way out then take a nice smooth full deep breath back in, triggering the inhaler at same time you start breathing in.  Hold breath in for at least  5 seconds if you can. Blow out breztri   thru nose. Rinse and gargle with water when done.  If mouth or throat bother you at all,  try brushing teeth/gums/tongue with arm and hammer toothpaste/ make a slurry and gargle and spit out.      Plan B = Backup (to supplement plan A, not to replace it) Only use your albuterol inhaler as a rescue medication to be used if you can't catch your breath by resting or doing a relaxed purse lip breathing pattern.  - The less you use it, the better it will work when you need it. - Ok to use the inhaler up to 2 puffs  every 4 hours if you must but call for appointment if use goes up over your usual need - Don't leave home without it !!  (think of it like the spare tire for your car)   Plan C = Crisis (instead of Plan B but only if Plan B stops working) - only use your albuterol nebulizer if you first try Plan B and it fails to help > ok to use the nebulizer up to every 4 hours but if start needing it regularly call for immediate appointment   Also  Ok to try albuterol 15 min before an activity (on alternating days)  that you know would usually make you short of breath and see if it makes any difference and if makes none then don't take albuterol after activity unless you can't catch your breath as this means it's the resting that helps, not the albuterol.      For cough > mucinex or mucinex dm up to 1200 every 12 hours as needed   Please remember to go to the lab department   for your tests - we will call you with the results when they are available.     The key is to stop smoking completely before smoking completely stops you!       Please schedule a follow up visit in 6 weeks - bring inhalers

## 2024-05-19 ENCOUNTER — Ambulatory Visit: Attending: Family Medicine

## 2024-05-19 DIAGNOSIS — M6281 Muscle weakness (generalized): Secondary | ICD-10-CM | POA: Insufficient documentation

## 2024-05-19 NOTE — Therapy (Signed)
 OUTPATIENT PHYSICAL THERAPY LOWER EXTREMITY EVALUATION   Patient Name: Dan [name] MRN: [id number] DOB:11/09/1945, 78 y.o., male Today's Date: 05/19/2024  END OF SESSION:  PT End of Session - 05/19/24 1350     Visit Number 1    Number of Visits 12    Date for PT Re-Evaluation 07/30/24    PT Start Time 1350    PT Stop Time 1428    PT Time Calculation (min) 38 min    Activity Tolerance Patient tolerated treatment well    Behavior During Therapy WFL for tasks assessed/performed          Past Medical History:  Diagnosis Date   Anemia    Arthritis    Asthma    BPH (benign prostatic hyperplasia)    COPD (chronic obstructive pulmonary disease) (HCC)    Depression    ED (erectile dysfunction)    Gout    Hyperlipidemia    Hypertension    Loss of hearing    Nocturia    Paroxysmal atrial fibrillation (HCC)    Prostate cancer (HCC)    Past Surgical History:  Procedure Laterality Date   CARDIOVASCULAR STRESS TEST  12/31/2004   normal perfuction nuclear study w/ no evidence ischemia /  normal LV function and wall motion , ef 52%   PROSTATECTOMY  2019   TOTAL HIP ARTHROPLASTY Left    TOTAL HIP ARTHROPLASTY Right 01/20/2024   Procedure: ARTHROPLASTY, HIP, TOTAL, ANTERIOR APPROACH;  Surgeon: Ernie Cough, MD;  Location: [facility];  Service: Orthopedics;  Laterality: Right;   Patient Active Problem List   Diagnosis Date Noted   Osteonecrosis of hip (HCC) 05/12/2024   S/P total right hip arthroplasty 01/20/2024   Osteoporosis 03/26/2023   Genetic testing 03/13/2023   Malignant neoplasm of prostate metastatic to intra-abdominal lymph node (HCC) 12/31/2022   Joint pain 07/03/2021   Other specified abnormal immunological findings in serum 07/03/2021   Primary osteoarthritis 07/03/2021   Abnormal CT scan, colon 01/22/2021   History of Clostridioides difficile colitis 01/22/2021   Diarrhea 01/22/2021   Dilated pancreatic duct 01/22/2021   Loss of appetite 11/27/2020   GERD  (gastroesophageal reflux disease) 11/11/2020   Hematochezia 11/11/2020   History of prostate cancer 11/11/2020   Chest wall pain 10/31/2020   Anemia 08/02/2020   Other specified personal risk factors, not elsewhere classified 07/18/2020   Body mass index (BMI) 22.0-22.9, adult 04/20/2020   PMR (polymyalgia rheumatica) (HCC) 03/29/2020   Fatigue 03/28/2020   Chronic diastolic CHF (congestive heart failure) (HCC) 03/09/2020   Atrial fibrillation (HCC) 12/15/2019   Claudication (HCC) 12/15/2019   Palpitations 12/15/2019   Cystitis 09/20/2019   Cigarette smoker 07/02/2019   Malignant neoplasm of prostate metastatic to intrapelvic lymph node (HCC) 09/23/2018   Elevated PSA 05/27/2018   Hyperlipidemia    Hypertension    COPD GOLD ? / Group E    Depression    Gout    Alcohol  dependency (HCC)    ED (erectile dysfunction)    Loss of hearing     PCP: Toribio Jerel MATSU, MD  REFERRING PROVIDER: Toribio Jerel MATSU, MD  REFERRING DIAG: generalized weakness   THERAPY DIAG:  Muscle weakness (generalized)  Rationale for Evaluation and Treatment: Rehabilitation  ONSET DATE: 1 year ago  SUBJECTIVE:   SUBJECTIVE STATEMENT: Patient reports that he has been having pain in his back and posterior lower extremities for at least 1 year. He feels that it has not been changing much since it first began. His  pain will vary depending on his activities, but he tries to keep a consistent routine throughout the day. He has fallen approximately four times in the past six months. His most recent recent fall was about a month ago while coming down a ladder. He trying to descend a ladder quickly and he misjudged his step. He landed on his arms, but he was not injured. He has a walker and Rolator, but he will only use these if he is traveling. He primarily uses a single point cane for mobility.   PERTINENT HISTORY: Hypertension, atrial fibrillation, COPD, osteoarthritis, congestive heart failure, osteoporosis,  depression, prostate cancer, current smoker, asthma, depression, and hard of hearing PAIN:  Are you having pain? Yes: NPRS scale: low Pain location: low back and posterior lower extremities Pain description: continuous Aggravating factors: gets worse throughout the day,  Relieving factors: laying back  PRECAUTIONS: Fall  RED FLAGS: None   WEIGHT BEARING RESTRICTIONS: No  FALLS:  Has patient fallen in last 6 months? Yes. Number of falls 4  LIVING ENVIRONMENT: Lives with: lives with their spouse Lives in: House/apartment Stairs: Yes: External: 3 steps; can reach both; step to pattern Has following equipment at home: Single point cane  OCCUPATION: disabled   PLOF: Independent  PATIENT GOALS: improved mobility and lower extremity strength  NEXT MD VISIT: 06/08/24  OBJECTIVE:  Note: Objective measures were completed at Evaluation unless otherwise noted.  COGNITION: Overall cognitive status: Within functional limits for tasks assessed     SENSATION: Light touch: Impaired with diminished sensation in both feet.  Patient reports intermittent tingling in both feet, but none currently  EDEMA:  Moderate bilateral ankle edema observed  POSTURE: rounded shoulders, forward head, and flexed trunk   LOWER EXTREMITY ROM:   LOWER EXTREMITY MMT:  MMT Right eval Left eval  Hip flexion 3+/5 3/5  Hip extension    Hip abduction    Hip adduction    Hip internal rotation    Hip external rotation    Knee flexion 4+/5 4/5  Knee extension 4-/5 4-/5  Ankle dorsiflexion 3+/5 3+/5  Ankle plantarflexion    Ankle inversion    Ankle eversion     (Blank rows = not tested)  FUNCTIONAL TESTS:  Timed up and go (TUG): 23.37 seconds with SPC  Sit to stand: requires UE support from armrests and fatigued after 1 repetition  GAIT: Assistive device utilized: Single point cane Level of assistance: Modified independence Comments: Decreased stride length and foot clearance  bilaterally   Gait speed: 0.47 m/s with single point cane                                                                                                                               TREATMENT DATE:     PATIENT EDUCATION:  Education details: POC, prognosis, objective findings, safety, fall risk, and goals for physical therapy Person educated: Patient Education method: Explanation Education comprehension: verbalized understanding  HOME  EXERCISE PROGRAM:   ASSESSMENT:  CLINICAL IMPRESSION: Patient is a 78 y.o. male who was seen today for physical therapy evaluation and treatment for deconditioning.  He is at a high fall risk as evidenced by his objective measures, functional testing, and gait mechanics, and history of falling.  He was educated on ways to reduce his risk of falling and he reported understanding.  Recommend that he continue with skilled physical therapy to address his impairments to maximize his safety and functional mobility.  OBJECTIVE IMPAIRMENTS: Abnormal gait, decreased activity tolerance, decreased balance, decreased mobility, difficulty walking, decreased strength, impaired sensation, and pain.   ACTIVITY LIMITATIONS: standing, stairs, transfers, and locomotion level  PARTICIPATION LIMITATIONS: meal prep, cleaning, laundry, community activity, and yard work  PERSONAL FACTORS: Fitness, Past/current experiences, Time since onset of injury/illness/exacerbation, Transportation, and 3+ comorbidities: Hypertension, atrial fibrillation, COPD, osteoarthritis, congestive heart failure, osteoporosis, depression, prostate cancer, current smoker, asthma, depression, and hard of hearing are also affecting patient's functional outcome.   REHAB POTENTIAL: Good  CLINICAL DECISION MAKING: Evolving/moderate complexity  EVALUATION COMPLEXITY: Moderate   GOALS: Goals reviewed with patient? Yes  SHORT TERM GOALS: Target date: 06/09/24 Patient will be independent with is initial  HEP.  Baseline: Goal status: INITIAL  2.  Patient will report being able to walk for at least 5 minutes for improved functional mobility. Baseline:  Goal status: INITIAL  3.  Patient will improve his gait speed to at least 0.60 m/s for improved functional mobility.  Baseline:  Goal status: INITIAL  LONG TERM GOALS: Target date: 06/30/24  Patient will be independent with his advanced HEP.  Baseline:  Goal status: INITIAL  2.  Patient will improve his timed up and go time to 12 seconds or less to reduce his fall risk.  Baseline:  Goal status: INITIAL  3.  Patient will be able to transfer from sitting to standing without upper extremity support.  Baseline:  Goal status: INITIAL  4.  Patient will report being able to walk for least 10 minutes for improved function completing quick trips to the store. Baseline:  Goal status: INITIAL  5.  Patient will improve his gait speed to at least 0.8 m/s for improved household mobility. Baseline:  Goal status: INITIAL  PLAN:  PT FREQUENCY: 2x/week  PT DURATION: 6 weeks  PLANNED INTERVENTIONS: 02835- PT Re-evaluation, 97750- Physical Performance Testing, 97110-Therapeutic exercises, 97530- Therapeutic activity, 97112- Neuromuscular re-education, 97535- Self Care, 02859- Manual therapy, [phone]- Gait training, Patient/Family education, Balance training, and Stair training  PLAN FOR NEXT SESSION: NuStep, isometrics, gait training, and lower extremity strengthening with upper extremity interventions as an active rest break   Lacinda JAYSON Fass, PT 05/19/2024, 4:59 PM

## 2024-05-21 ENCOUNTER — Ambulatory Visit: Payer: Self-pay | Admitting: Internal Medicine

## 2024-05-21 LAB — CBC WITH DIFFERENTIAL/PLATELET
Basophils Absolute: 0.1 x10E3/uL (ref 0.0–0.2)
Basos: 0 %
EOS (ABSOLUTE): 0.1 x10E3/uL (ref 0.0–0.4)
Eos: 1 %
Hematocrit: 33 % — ABNORMAL LOW (ref 37.5–51.0)
Hemoglobin: 10.5 g/dL — ABNORMAL LOW (ref 13.0–17.7)
Immature Grans (Abs): 0.4 x10E3/uL — ABNORMAL HIGH (ref 0.0–0.1)
Immature Granulocytes: 3 %
Lymphocytes Absolute: 1.8 x10E3/uL (ref 0.7–3.1)
Lymphs: 14 %
MCH: 28.9 pg (ref 26.6–33.0)
MCHC: 31.8 g/dL (ref 31.5–35.7)
MCV: 91 fL (ref 79–97)
Monocytes Absolute: 0.9 x10E3/uL (ref 0.1–0.9)
Monocytes: 7 %
Neutrophils Absolute: 9.8 x10E3/uL — ABNORMAL HIGH (ref 1.4–7.0)
Neutrophils: 75 %
Platelets: 484 x10E3/uL — ABNORMAL HIGH (ref 150–450)
RBC: 3.63 x10E6/uL — ABNORMAL LOW (ref 4.14–5.80)
RDW: 15.5 % — ABNORMAL HIGH (ref 11.6–15.4)
WBC: 13.1 x10E3/uL — ABNORMAL HIGH (ref 3.4–10.8)

## 2024-05-21 LAB — ALPHA-1-ANTITRYPSIN PHENOTYP: A-1 Antitrypsin: 259 mg/dL — ABNORMAL HIGH (ref 101–187)

## 2024-05-21 LAB — IGE: IgE (Immunoglobulin E), Serum: 380 [IU]/mL (ref 6–495)

## 2024-05-24 DIAGNOSIS — Z1329 Encounter for screening for other suspected endocrine disorder: Secondary | ICD-10-CM | POA: Diagnosis not present

## 2024-05-24 DIAGNOSIS — I10 Essential (primary) hypertension: Secondary | ICD-10-CM | POA: Diagnosis not present

## 2024-05-24 DIAGNOSIS — R5383 Other fatigue: Secondary | ICD-10-CM | POA: Diagnosis not present

## 2024-05-24 DIAGNOSIS — D559 Anemia due to enzyme disorder, unspecified: Secondary | ICD-10-CM | POA: Diagnosis not present

## 2024-05-24 DIAGNOSIS — E7849 Other hyperlipidemia: Secondary | ICD-10-CM | POA: Diagnosis not present

## 2024-05-24 NOTE — Progress Notes (Signed)
ATC x1.  LMTCB. 

## 2024-05-26 ENCOUNTER — Ambulatory Visit

## 2024-05-26 DIAGNOSIS — M6281 Muscle weakness (generalized): Secondary | ICD-10-CM

## 2024-05-26 NOTE — Therapy (Signed)
 OUTPATIENT PHYSICAL THERAPY LOWER EXTREMITY TREATMENT   Patient Name: Dan [name] MRN: [id number] DOB:September 08, 1946, 78 y.o., male Today's Date: 05/26/2024  END OF SESSION:  PT End of Session - 05/26/24 1054     Visit Number 2    Number of Visits 12    Date for PT Re-Evaluation 07/30/24    PT Start Time 1100    PT Stop Time 1141    PT Time Calculation (min) 41 min    Activity Tolerance Patient limited by fatigue    Behavior During Therapy [facility] for tasks assessed/performed          Past Medical History:  Diagnosis Date   Anemia    Arthritis    Asthma    BPH (benign prostatic hyperplasia)    COPD (chronic obstructive pulmonary disease) (HCC)    Depression    ED (erectile dysfunction)    Gout    Hyperlipidemia    Hypertension    Loss of hearing    Nocturia    Paroxysmal atrial fibrillation (HCC)    Prostate cancer (HCC)    Past Surgical History:  Procedure Laterality Date   CARDIOVASCULAR STRESS TEST  12/31/2004   normal perfuction nuclear study w/ no evidence ischemia /  normal LV function and wall motion , ef 52%   PROSTATECTOMY  2019   TOTAL HIP ARTHROPLASTY Left    TOTAL HIP ARTHROPLASTY Right 01/20/2024   Procedure: ARTHROPLASTY, HIP, TOTAL, ANTERIOR APPROACH;  Surgeon: Ernie Cough, MD;  Location: [facility];  Service: Orthopedics;  Laterality: Right;   Patient Active Problem List   Diagnosis Date Noted   Osteonecrosis of hip (HCC) 05/12/2024   S/P total right hip arthroplasty 01/20/2024   Osteoporosis 03/26/2023   Genetic testing 03/13/2023   Malignant neoplasm of prostate metastatic to intra-abdominal lymph node (HCC) 12/31/2022   Joint pain 07/03/2021   Other specified abnormal immunological findings in serum 07/03/2021   Primary osteoarthritis 07/03/2021   Abnormal CT scan, colon 01/22/2021   History of Clostridioides difficile colitis 01/22/2021   Diarrhea 01/22/2021   Dilated pancreatic duct 01/22/2021   Loss of appetite 11/27/2020   GERD  (gastroesophageal reflux disease) 11/11/2020   Hematochezia 11/11/2020   History of prostate cancer 11/11/2020   Chest wall pain 10/31/2020   Anemia 08/02/2020   Other specified personal risk factors, not elsewhere classified 07/18/2020   Body mass index (BMI) 22.0-22.9, adult 04/20/2020   PMR (polymyalgia rheumatica) (HCC) 03/29/2020   Fatigue 03/28/2020   Chronic diastolic CHF (congestive heart failure) (HCC) 03/09/2020   Atrial fibrillation (HCC) 12/15/2019   Claudication (HCC) 12/15/2019   Palpitations 12/15/2019   Cystitis 09/20/2019   Cigarette smoker 07/02/2019   Malignant neoplasm of prostate metastatic to intrapelvic lymph node (HCC) 09/23/2018   Elevated PSA 05/27/2018   Hyperlipidemia    Hypertension    COPD GOLD ? / Group E    Depression    Gout    Alcohol  dependency (HCC)    ED (erectile dysfunction)    Loss of hearing     PCP: Toribio Jerel MATSU, MD  REFERRING PROVIDER: Toribio Jerel MATSU, MD  REFERRING DIAG: generalized weakness   THERAPY DIAG:  Muscle weakness (generalized)  Rationale for Evaluation and Treatment: Rehabilitation  ONSET DATE: 1 year ago  SUBJECTIVE:   SUBJECTIVE STATEMENT: Patient states they've been better, stating that he is tired today.  PERTINENT HISTORY: Hypertension, atrial fibrillation, COPD, osteoarthritis, congestive heart failure, osteoporosis, depression, prostate cancer, current smoker, asthma, depression, and hard of hearing  PAIN:  Are you having pain? Yes: NPRS scale: low Pain location: low back and posterior lower extremities Pain description: continuous Aggravating factors: gets worse throughout the day,  Relieving factors: laying back  PRECAUTIONS: Fall  RED FLAGS: None   WEIGHT BEARING RESTRICTIONS: No  FALLS:  Has patient fallen in last 6 months? Yes. Number of falls 4  LIVING ENVIRONMENT: Lives with: lives with their spouse Lives in: House/apartment Stairs: Yes: External: 3 steps; can reach both; step  to pattern Has following equipment at home: Single point cane  OCCUPATION: disabled   PLOF: Independent  PATIENT GOALS: improved mobility and lower extremity strength  NEXT MD VISIT: 06/08/24  OBJECTIVE:  Note: Objective measures were completed at Evaluation unless otherwise noted.  COGNITION: Overall cognitive status: Within functional limits for tasks assessed     SENSATION: Light touch: Impaired with diminished sensation in both feet.  Patient reports intermittent tingling in both feet, but none currently  EDEMA:  Moderate bilateral ankle edema observed  POSTURE: rounded shoulders, forward head, and flexed trunk   LOWER EXTREMITY ROM:   LOWER EXTREMITY MMT:  MMT Right eval Left eval  Hip flexion 3+/5 3/5  Hip extension    Hip abduction    Hip adduction    Hip internal rotation    Hip external rotation    Knee flexion 4+/5 4/5  Knee extension 4-/5 4-/5  Ankle dorsiflexion 3+/5 3+/5  Ankle plantarflexion    Ankle inversion    Ankle eversion     (Blank rows = not tested)  FUNCTIONAL TESTS:  Timed up and go (TUG): 23.37 seconds with SPC  Sit to stand: requires UE support from armrests and fatigued after 1 repetition  GAIT: Assistive device utilized: Single point cane Level of assistance: Modified independence Comments: Decreased stride length and foot clearance bilaterally   Gait speed: 0.47 m/s with single point cane                                                                                                                               TREATMENT DATE:   05/26/2024                                    EXERCISE LOG  Exercise Repetitions and Resistance Comments  Nustep L3 x 3 mins patient couldn't do 5 mins   Seated marching 3x20s 2# on R leg   Seated LAQ 2x10    Hip abd/add isometrics 2x10    Seated hamstring curls 3x20s 2# R leg   Static standing on airex 3X 30s CGA    Dorsiflexion w/ red tb  2x15 bilaterally   Modified tandem stance on airex  2x40s CGA    Blank cell = exercise not performed today   PATIENT EDUCATION:  Education details: POC, prognosis, objective findings, safety, fall risk, and goals for physical therapy Person educated: Patient  Education method: Explanation Education comprehension: verbalized understanding  HOME EXERCISE PROGRAM:   ASSESSMENT:  CLINICAL IMPRESSION:  Patient presents for visit 2 today stating he isn't feeling his best. Patient wasn't able to complete 5 minute on the nustep due to muscle weakness in legs. Began LE strength training today in a seated position. Patient unable to clear L foot off ground during seated marches, however, he stated he felt it working. Patient able to complete all sets of glute exercises, with moderate effort. Began balance training today with standing on airex pad which required CGA to complete without UE support. Patient was appropriately fatigued throughout session. Patient would benefit from continued skilled physical therapy to address impairments below.   OBJECTIVE IMPAIRMENTS: Abnormal gait, decreased activity tolerance, decreased balance, decreased mobility, difficulty walking, decreased strength, impaired sensation, and pain.   ACTIVITY LIMITATIONS: standing, stairs, transfers, and locomotion level  PARTICIPATION LIMITATIONS: meal prep, cleaning, laundry, community activity, and yard work  PERSONAL FACTORS: Fitness, Past/current experiences, Time since onset of injury/illness/exacerbation, Transportation, and 3+ comorbidities: Hypertension, atrial fibrillation, COPD, osteoarthritis, congestive heart failure, osteoporosis, depression, prostate cancer, current smoker, asthma, depression, and hard of hearing are also affecting patient's functional outcome.   REHAB POTENTIAL: Good  CLINICAL DECISION MAKING: Evolving/moderate complexity  EVALUATION COMPLEXITY: Moderate   GOALS: Goals reviewed with patient? Yes  SHORT TERM GOALS: Target date:  06/09/24 Patient will be independent with is initial HEP.  Baseline: Goal status: INITIAL  2.  Patient will report being able to walk for at least 5 minutes for improved functional mobility. Baseline:  Goal status: INITIAL  3.  Patient will improve his gait speed to at least 0.60 m/s for improved functional mobility.  Baseline:  Goal status: INITIAL  LONG TERM GOALS: Target date: 06/30/24  Patient will be independent with his advanced HEP.  Baseline:  Goal status: INITIAL  2.  Patient will improve his timed up and go time to 12 seconds or less to reduce his fall risk.  Baseline:  Goal status: INITIAL  3.  Patient will be able to transfer from sitting to standing without upper extremity support.  Baseline:  Goal status: INITIAL  4.  Patient will report being able to walk for least 10 minutes for improved function completing quick trips to the store. Baseline:  Goal status: INITIAL  5.  Patient will improve his gait speed to at least 0.8 m/s for improved household mobility. Baseline:  Goal status: INITIAL  PLAN:  PT FREQUENCY: 2x/week  PT DURATION: 6 weeks  PLANNED INTERVENTIONS: 02835- PT Re-evaluation, 97750- Physical Performance Testing, 97110-Therapeutic exercises, 97530- Therapeutic activity, 97112- Neuromuscular re-education, 97535- Self Care, 02859- Manual therapy, [id number]- Gait training, Patient/Family education, Balance training, and Stair training  PLAN FOR NEXT SESSION: NuStep, isometrics, gait training, and lower extremity strengthening with upper extremity interventions as an active rest break, continue balance training   Estefana Jude, Student-PT 05/26/2024, 11:50 AM

## 2024-05-26 NOTE — Progress Notes (Signed)
 ATC x2 lmtcb sending letter

## 2024-05-28 ENCOUNTER — Ambulatory Visit

## 2024-05-28 DIAGNOSIS — M6281 Muscle weakness (generalized): Secondary | ICD-10-CM

## 2024-05-28 NOTE — Therapy (Signed)
 OUTPATIENT PHYSICAL THERAPY LOWER EXTREMITY TREATMENT   Patient Name: Dan [name] MRN: [id number] DOB:[birth year], 78 y.o., male Today's Date: 05/28/2024  END OF SESSION:  PT End of Session - 05/28/24 1101     Visit Number 3    Number of Visits 12    Date for PT Re-Evaluation 07/30/24    PT Start Time 1059    PT Stop Time 1140    PT Time Calculation (min) 41 min    Activity Tolerance Patient limited by fatigue    Behavior During Therapy [facility] for tasks assessed/performed          Past Medical History:  Diagnosis Date   Anemia    Arthritis    Asthma    BPH (benign prostatic hyperplasia)    COPD (chronic obstructive pulmonary disease) (HCC)    Depression    ED (erectile dysfunction)    Gout    Hyperlipidemia    Hypertension    Loss of hearing    Nocturia    Paroxysmal atrial fibrillation (HCC)    Prostate cancer (HCC)    Past Surgical History:  Procedure Laterality Date   CARDIOVASCULAR STRESS TEST  12/31/2004   normal perfuction nuclear study w/ no evidence ischemia /  normal LV function and wall motion , ef 52%   PROSTATECTOMY  2019   TOTAL HIP ARTHROPLASTY Left    TOTAL HIP ARTHROPLASTY Right 01/20/2024   Procedure: ARTHROPLASTY, HIP, TOTAL, ANTERIOR APPROACH;  Surgeon: Ernie Cough, MD;  Location: [facility];  Service: Orthopedics;  Laterality: Right;   Patient Active Problem List   Diagnosis Date Noted   Osteonecrosis of hip (HCC) 05/12/2024   S/P total right hip arthroplasty 01/20/2024   Osteoporosis 03/26/2023   Genetic testing 03/13/2023   Malignant neoplasm of prostate metastatic to intra-abdominal lymph node (HCC) 12/31/2022   Joint pain 07/03/2021   Other specified abnormal immunological findings in serum 07/03/2021   Primary osteoarthritis 07/03/2021   Abnormal CT scan, colon 01/22/2021   History of Clostridioides difficile colitis 01/22/2021   Diarrhea 01/22/2021   Dilated pancreatic duct 01/22/2021   Loss of appetite 11/27/2020   GERD  (gastroesophageal reflux disease) 11/11/2020   Hematochezia 11/11/2020   History of prostate cancer 11/11/2020   Chest wall pain 10/31/2020   Anemia 08/02/2020   Other specified personal risk factors, not elsewhere classified 07/18/2020   Body mass index (BMI) 22.0-22.9, adult 04/20/2020   PMR (polymyalgia rheumatica) (HCC) 03/29/2020   Fatigue 03/28/2020   Chronic diastolic CHF (congestive heart failure) (HCC) 03/09/2020   Atrial fibrillation (HCC) 12/15/2019   Claudication (HCC) 12/15/2019   Palpitations 12/15/2019   Cystitis 09/20/2019   Cigarette smoker 07/02/2019   Malignant neoplasm of prostate metastatic to intrapelvic lymph node (HCC) 09/23/2018   Elevated PSA 05/27/2018   Hyperlipidemia    Hypertension    COPD GOLD ? / Group E    Depression    Gout    Alcohol  dependency (HCC)    ED (erectile dysfunction)    Loss of hearing     PCP: Toribio Jerel MATSU, MD  REFERRING PROVIDER: Toribio Jerel MATSU, MD  REFERRING DIAG: generalized weakness   THERAPY DIAG:  Muscle weakness (generalized)  Rationale for Evaluation and Treatment: Rehabilitation  ONSET DATE: 1 year ago  SUBJECTIVE:   SUBJECTIVE STATEMENT:  Pt denies any pain, but reports feeling tired and wore out.  PERTINENT HISTORY: Hypertension, atrial fibrillation, COPD, osteoarthritis, congestive heart failure, osteoporosis, depression, prostate cancer, current smoker, asthma, depression, and hard of  hearing PAIN:  Are you having pain? No  PRECAUTIONS: Fall  RED FLAGS: None   WEIGHT BEARING RESTRICTIONS: No  FALLS:  Has patient fallen in last 6 months? Yes. Number of falls 4  LIVING ENVIRONMENT: Lives with: lives with their spouse Lives in: House/apartment Stairs: Yes: External: 3 steps; can reach both; step to pattern Has following equipment at home: Single point cane  OCCUPATION: disabled   PLOF: Independent  PATIENT GOALS: improved mobility and lower extremity strength  NEXT MD VISIT:  06/08/24  OBJECTIVE:  Note: Objective measures were completed at Evaluation unless otherwise noted.  COGNITION: Overall cognitive status: Within functional limits for tasks assessed     SENSATION: Light touch: Impaired with diminished sensation in both feet.  Patient reports intermittent tingling in both feet, but none currently  EDEMA:  Moderate bilateral ankle edema observed  POSTURE: rounded shoulders, forward head, and flexed trunk   LOWER EXTREMITY ROM:   LOWER EXTREMITY MMT:  MMT Right eval Left eval  Hip flexion 3+/5 3/5  Hip extension    Hip abduction    Hip adduction    Hip internal rotation    Hip external rotation    Knee flexion 4+/5 4/5  Knee extension 4-/5 4-/5  Ankle dorsiflexion 3+/5 3+/5  Ankle plantarflexion    Ankle inversion    Ankle eversion     (Blank rows = not tested)  FUNCTIONAL TESTS:  Timed up and go (TUG): 23.37 seconds with SPC  Sit to stand: requires UE support from armrests and fatigued after 1 repetition  GAIT: Assistive device utilized: Single point cane Level of assistance: Modified independence Comments: Decreased stride length and foot clearance bilaterally   Gait speed: 0.47 m/s with single point cane                                                                                                                               TREATMENT DATE:   05/28/2024                                   EXERCISE LOG  Exercise Repetitions and Resistance Comments  Nustep L1 x 13 mins (1 lap)   Seated marching 2# 2 sets of 10   Seated LAQ 2# 2 sets of 10    Hip add isometrics 2 mins   Hip abd isometrics Red x 2 mins   Seated hamstring curls Red 2 sets of 10 reps   Heel/Toe Raises 2 mins            Blank cell = exercise not performed today   PATIENT EDUCATION:  Education details: POC, prognosis, objective findings, safety, fall risk, and goals for physical therapy Person educated: Patient Education method: Explanation Education  comprehension: verbalized understanding  HOME EXERCISE PROGRAM:   ASSESSMENT:  CLINICAL IMPRESSION:  Pt arrives for today's treatment session denying any pain, but  reports feeling tired and wore out.  Pt able to tolerate increased resistance with all exercises performed today.  Pt requiring several seated rest breaks due to fatigue.   Pt educated in the importance of being as active as possible and the dangers of remaining stagnant.  Discussed activity planning and energy conservation strategies.   Pt receptive to all education.  Pt denied any pain at completion of today's treatment session, but does endorse increased fatigue.  OBJECTIVE IMPAIRMENTS: Abnormal gait, decreased activity tolerance, decreased balance, decreased mobility, difficulty walking, decreased strength, impaired sensation, and pain.   ACTIVITY LIMITATIONS: standing, stairs, transfers, and locomotion level  PARTICIPATION LIMITATIONS: meal prep, cleaning, laundry, community activity, and yard work  PERSONAL FACTORS: Fitness, Past/current experiences, Time since onset of injury/illness/exacerbation, Transportation, and 3+ comorbidities: Hypertension, atrial fibrillation, COPD, osteoarthritis, congestive heart failure, osteoporosis, depression, prostate cancer, current smoker, asthma, depression, and hard of hearing are also affecting patient's functional outcome.   REHAB POTENTIAL: Good  CLINICAL DECISION MAKING: Evolving/moderate complexity  EVALUATION COMPLEXITY: Moderate   GOALS: Goals reviewed with patient? Yes  SHORT TERM GOALS: Target date: 06/09/24 Patient will be independent with is initial HEP.  Baseline: Goal status: INITIAL  2.  Patient will report being able to walk for at least 5 minutes for improved functional mobility. Baseline:  Goal status: INITIAL  3.  Patient will improve his gait speed to at least 0.60 m/s for improved functional mobility.  Baseline:  Goal status: INITIAL  LONG TERM GOALS:  Target date: 06/30/24  Patient will be independent with his advanced HEP.  Baseline:  Goal status: INITIAL  2.  Patient will improve his timed up and go time to 12 seconds or less to reduce his fall risk.  Baseline:  Goal status: INITIAL  3.  Patient will be able to transfer from sitting to standing without upper extremity support.  Baseline:  Goal status: INITIAL  4.  Patient will report being able to walk for least 10 minutes for improved function completing quick trips to the store. Baseline:  Goal status: INITIAL  5.  Patient will improve his gait speed to at least 0.8 m/s for improved household mobility. Baseline:  Goal status: INITIAL  PLAN:  PT FREQUENCY: 2x/week  PT DURATION: 6 weeks  PLANNED INTERVENTIONS: 97164- PT Re-evaluation, 97750- Physical Performance Testing, 97110-Therapeutic exercises, 97530- Therapeutic activity, 97112- Neuromuscular re-education, 97535- Self Care, 02859- Manual therapy, [id number]- Gait training, Patient/Family education, Balance training, and Stair training  PLAN FOR NEXT SESSION: NuStep, isometrics, gait training, and lower extremity strengthening with upper extremity interventions as an active rest break, continue balance training   Delon DELENA Gosling, PTA 05/28/2024, 12:06 PM

## 2024-05-31 ENCOUNTER — Ambulatory Visit

## 2024-05-31 DIAGNOSIS — Z6821 Body mass index (BMI) 21.0-21.9, adult: Secondary | ICD-10-CM | POA: Diagnosis not present

## 2024-05-31 DIAGNOSIS — I739 Peripheral vascular disease, unspecified: Secondary | ICD-10-CM | POA: Diagnosis not present

## 2024-05-31 DIAGNOSIS — G609 Hereditary and idiopathic neuropathy, unspecified: Secondary | ICD-10-CM | POA: Diagnosis not present

## 2024-05-31 DIAGNOSIS — F17210 Nicotine dependence, cigarettes, uncomplicated: Secondary | ICD-10-CM | POA: Diagnosis not present

## 2024-05-31 DIAGNOSIS — I10 Essential (primary) hypertension: Secondary | ICD-10-CM | POA: Diagnosis not present

## 2024-05-31 DIAGNOSIS — J449 Chronic obstructive pulmonary disease, unspecified: Secondary | ICD-10-CM | POA: Diagnosis not present

## 2024-05-31 DIAGNOSIS — Z0001 Encounter for general adult medical examination with abnormal findings: Secondary | ICD-10-CM | POA: Diagnosis not present

## 2024-05-31 NOTE — Therapy (Unsigned)
 Patient reported that he did not know how much he could do today. He was able to complete 2 minutes on the Nustep at level 1. He stopped at that point and relayed that he could no longer continue with physical therapy today. He declined any other interventions and requested to leave.   Lacinda Fass, PT, DPT

## 2024-06-01 ENCOUNTER — Inpatient Hospital Stay

## 2024-06-01 DIAGNOSIS — C61 Malignant neoplasm of prostate: Secondary | ICD-10-CM

## 2024-06-01 DIAGNOSIS — D508 Other iron deficiency anemias: Secondary | ICD-10-CM

## 2024-06-01 DIAGNOSIS — E611 Iron deficiency: Secondary | ICD-10-CM | POA: Diagnosis not present

## 2024-06-01 LAB — CBC WITH DIFFERENTIAL/PLATELET
Abs Immature Granulocytes: 0.2 K/uL — ABNORMAL HIGH (ref 0.00–0.07)
Basophils Absolute: 0.1 K/uL (ref 0.0–0.1)
Basophils Relative: 0 %
Eosinophils Absolute: 0.1 K/uL (ref 0.0–0.5)
Eosinophils Relative: 1 %
HCT: 32.3 % — ABNORMAL LOW (ref 39.0–52.0)
Hemoglobin: 10.4 g/dL — ABNORMAL LOW (ref 13.0–17.0)
Immature Granulocytes: 2 %
Lymphocytes Relative: 7 %
Lymphs Abs: 0.9 K/uL (ref 0.7–4.0)
MCH: 29.3 pg (ref 26.0–34.0)
MCHC: 32.2 g/dL (ref 30.0–36.0)
MCV: 91 fL (ref 80.0–100.0)
Monocytes Absolute: 0.4 K/uL (ref 0.1–1.0)
Monocytes Relative: 3 %
Neutro Abs: 11.7 K/uL — ABNORMAL HIGH (ref 1.7–7.7)
Neutrophils Relative %: 87 %
Platelets: 515 K/uL — ABNORMAL HIGH (ref 150–400)
RBC: 3.55 MIL/uL — ABNORMAL LOW (ref 4.22–5.81)
RDW: 18 % — ABNORMAL HIGH (ref 11.5–15.5)
WBC: 13.4 K/uL — ABNORMAL HIGH (ref 4.0–10.5)
nRBC: 0 % (ref 0.0–0.2)

## 2024-06-01 LAB — COMPREHENSIVE METABOLIC PANEL WITH GFR
ALT: 8 U/L (ref 0–44)
AST: 16 U/L (ref 15–41)
Albumin: 3.5 g/dL (ref 3.5–5.0)
Alkaline Phosphatase: 74 U/L (ref 38–126)
Anion gap: 13 (ref 5–15)
BUN: 18 mg/dL (ref 8–23)
CO2: 21 mmol/L — ABNORMAL LOW (ref 22–32)
Calcium: 10.5 mg/dL — ABNORMAL HIGH (ref 8.9–10.3)
Chloride: 99 mmol/L (ref 98–111)
Creatinine, Ser: 1.11 mg/dL (ref 0.61–1.24)
GFR, Estimated: >60 mL/min (ref >60)
Glucose, Bld: 110 mg/dL — ABNORMAL HIGH (ref 70–99)
Potassium: 3.8 mmol/L (ref 3.5–5.1)
Sodium: 133 mmol/L — ABNORMAL LOW (ref 135–145)
Total Bilirubin: 0.5 mg/dL (ref 0.0–1.2)
Total Protein: 7.1 g/dL (ref 6.5–8.1)

## 2024-06-01 LAB — IRON AND TIBC
Iron: 85 ug/dL (ref 45–182)
Saturation Ratios: 29 % (ref 17.9–39.5)
TIBC: 293 ug/dL (ref 250–450)
UIBC: 208 ug/dL

## 2024-06-01 LAB — FERRITIN: Ferritin: 514 ng/mL — ABNORMAL HIGH (ref 24–336)

## 2024-06-01 LAB — PSA: Prostatic Specific Antigen: <0.01 ng/mL (ref 0.00–4.00)

## 2024-06-02 DIAGNOSIS — Z1331 Encounter for screening for depression: Secondary | ICD-10-CM | POA: Diagnosis not present

## 2024-06-02 DIAGNOSIS — Z0001 Encounter for general adult medical examination with abnormal findings: Secondary | ICD-10-CM | POA: Diagnosis not present

## 2024-06-02 DIAGNOSIS — Z6821 Body mass index (BMI) 21.0-21.9, adult: Secondary | ICD-10-CM | POA: Diagnosis not present

## 2024-06-02 DIAGNOSIS — Z1389 Encounter for screening for other disorder: Secondary | ICD-10-CM | POA: Diagnosis not present

## 2024-06-04 ENCOUNTER — Ambulatory Visit: Attending: Family Medicine

## 2024-06-04 ENCOUNTER — Other Ambulatory Visit (HOSPITAL_COMMUNITY): Payer: Self-pay

## 2024-06-04 DIAGNOSIS — M6281 Muscle weakness (generalized): Secondary | ICD-10-CM | POA: Diagnosis not present

## 2024-06-04 NOTE — Therapy (Signed)
 OUTPATIENT PHYSICAL THERAPY LOWER EXTREMITY TREATMENT   Patient Name: Dan [name] MRN: [id number] DOB:June 07, 1946, 78 y.o., male Today's Date: 06/04/2024  END OF SESSION:  PT End of Session - 06/04/24 1025     Visit Number 4    Number of Visits 12    Date for PT Re-Evaluation 07/30/24    PT Start Time 1020    PT Stop Time 1053    PT Time Calculation (min) 33 min    Activity Tolerance Patient limited by fatigue    Behavior During Therapy WFL for tasks assessed/performed          Past Medical History:  Diagnosis Date   Anemia    Arthritis    Asthma    BPH (benign prostatic hyperplasia)    COPD (chronic obstructive pulmonary disease) (HCC)    Depression    ED (erectile dysfunction)    Gout    Hyperlipidemia    Hypertension    Loss of hearing    Nocturia    Paroxysmal atrial fibrillation (HCC)    Prostate cancer (HCC)    Past Surgical History:  Procedure Laterality Date   CARDIOVASCULAR STRESS TEST  12/31/2004   normal perfuction nuclear study w/ no evidence ischemia /  normal LV function and wall motion , ef 52%   PROSTATECTOMY  2019   TOTAL HIP ARTHROPLASTY Left    TOTAL HIP ARTHROPLASTY Right 01/20/2024   Procedure: ARTHROPLASTY, HIP, TOTAL, ANTERIOR APPROACH;  Surgeon: Ernie Cough, MD;  Location: [facility];  Service: Orthopedics;  Laterality: Right;   Patient Active Problem List   Diagnosis Date Noted   Osteonecrosis of hip (HCC) 05/12/2024   S/P total right hip arthroplasty 01/20/2024   Osteoporosis 03/26/2023   Genetic testing 03/13/2023   Malignant neoplasm of prostate metastatic to intra-abdominal lymph node (HCC) 12/31/2022   Joint pain 07/03/2021   Other specified abnormal immunological findings in serum 07/03/2021   Primary osteoarthritis 07/03/2021   Abnormal CT scan, colon 01/22/2021   History of Clostridioides difficile colitis 01/22/2021   Diarrhea 01/22/2021   Dilated pancreatic duct 01/22/2021   Loss of appetite 11/27/2020   GERD  (gastroesophageal reflux disease) 11/11/2020   Hematochezia 11/11/2020   History of prostate cancer 11/11/2020   Chest wall pain 10/31/2020   Anemia 08/02/2020   Other specified personal risk factors, not elsewhere classified 07/18/2020   Body mass index (BMI) 22.0-22.9, adult 04/20/2020   PMR (polymyalgia rheumatica) (HCC) 03/29/2020   Fatigue 03/28/2020   Chronic diastolic CHF (congestive heart failure) (HCC) 03/09/2020   Atrial fibrillation (HCC) 12/15/2019   Claudication (HCC) 12/15/2019   Palpitations 12/15/2019   Cystitis 09/20/2019   Cigarette smoker 07/02/2019   Malignant neoplasm of prostate metastatic to intrapelvic lymph node (HCC) 09/23/2018   Elevated PSA 05/27/2018   Hyperlipidemia    Hypertension    COPD GOLD ? / Group E    Depression    Gout    Alcohol  dependency (HCC)    ED (erectile dysfunction)    Loss of hearing     PCP: Toribio Jerel MATSU, MD  REFERRING PROVIDER: Toribio Jerel MATSU, MD  REFERRING DIAG: generalized weakness   THERAPY DIAG:  Muscle weakness (generalized)  Rationale for Evaluation and Treatment: Rehabilitation  ONSET DATE: 1 year ago  SUBJECTIVE:   SUBJECTIVE STATEMENT:  Pt denies any pain, but reports feeling tired today.  PERTINENT HISTORY: Hypertension, atrial fibrillation, COPD, osteoarthritis, congestive heart failure, osteoporosis, depression, prostate cancer, current smoker, asthma, depression, and hard of hearing PAIN:  Are you having pain? No  PRECAUTIONS: Fall  RED FLAGS: None   WEIGHT BEARING RESTRICTIONS: No  FALLS:  Has patient fallen in last 6 months? Yes. Number of falls 4  LIVING ENVIRONMENT: Lives with: lives with their spouse Lives in: House/apartment Stairs: Yes: External: 3 steps; can reach both; step to pattern Has following equipment at home: Single point cane  OCCUPATION: disabled   PLOF: Independent  PATIENT GOALS: improved mobility and lower extremity strength  NEXT MD VISIT:  06/08/24  OBJECTIVE:  Note: Objective measures were completed at Evaluation unless otherwise noted.  COGNITION: Overall cognitive status: Within functional limits for tasks assessed     SENSATION: Light touch: Impaired with diminished sensation in both feet.  Patient reports intermittent tingling in both feet, but none currently  EDEMA:  Moderate bilateral ankle edema observed  POSTURE: rounded shoulders, forward head, and flexed trunk   LOWER EXTREMITY ROM:   LOWER EXTREMITY MMT:  MMT Right eval Left eval  Hip flexion 3+/5 3/5  Hip extension    Hip abduction    Hip adduction    Hip internal rotation    Hip external rotation    Knee flexion 4+/5 4/5  Knee extension 4-/5 4-/5  Ankle dorsiflexion 3+/5 3+/5  Ankle plantarflexion    Ankle inversion    Ankle eversion     (Blank rows = not tested)  FUNCTIONAL TESTS:  Timed up and go (TUG): 23.37 seconds with SPC  Sit to stand: requires UE support from armrests and fatigued after 1 repetition  GAIT: Assistive device utilized: Single point cane Level of assistance: Modified independence Comments: Decreased stride length and foot clearance bilaterally   Gait speed: 0.47 m/s with single point cane                                                                                                                               TREATMENT DATE:    06/04/2024                                   EXERCISE LOG  Exercise Repetitions and Resistance Comments  Nustep L1 x 13.5 mins (1 lap)   Seated marching 2# 2x45s   Seated LAQ 2# 2x45s   Hip add isometrics 1x45s    Hip abd isometrics    Seated hamstring curls    Heel/Toe Raises             Blank cell = exercise not performed today   PATIENT EDUCATION:  Education details: POC, prognosis, objective findings, safety, fall risk, and goals for physical therapy Person educated: Patient Education method: Explanation Education comprehension: verbalized understanding  HOME EXERCISE  PROGRAM:   ASSESSMENT:  CLINICAL IMPRESSION:  Pt arrives for today's treatment session denying any pain, but reports feeling tired today.  Pt able to tolerate increase in time on Nustep today, but does experience  increased fatigue.  Pt able to tolerate a few seated exercises today with seated rest breaks as needed.  Pt requesting that session be ended early today due to fatigue.  Pt denied any pain at completion of today's treatment session.   OBJECTIVE IMPAIRMENTS: Abnormal gait, decreased activity tolerance, decreased balance, decreased mobility, difficulty walking, decreased strength, impaired sensation, and pain.   ACTIVITY LIMITATIONS: standing, stairs, transfers, and locomotion level  PARTICIPATION LIMITATIONS: meal prep, cleaning, laundry, community activity, and yard work  PERSONAL FACTORS: Fitness, Past/current experiences, Time since onset of injury/illness/exacerbation, Transportation, and 3+ comorbidities: Hypertension, atrial fibrillation, COPD, osteoarthritis, congestive heart failure, osteoporosis, depression, prostate cancer, current smoker, asthma, depression, and hard of hearing are also affecting patient's functional outcome.   REHAB POTENTIAL: Good  CLINICAL DECISION MAKING: Evolving/moderate complexity  EVALUATION COMPLEXITY: Moderate   GOALS: Goals reviewed with patient? Yes  SHORT TERM GOALS: Target date: 06/09/24 Patient will be independent with is initial HEP.  Baseline: Goal status: INITIAL  2.  Patient will report being able to walk for at least 5 minutes for improved functional mobility. Baseline:  Goal status: INITIAL  3.  Patient will improve his gait speed to at least 0.60 m/s for improved functional mobility.  Baseline:  Goal status: INITIAL  LONG TERM GOALS: Target date: 06/30/24  Patient will be independent with his advanced HEP.  Baseline:  Goal status: INITIAL  2.  Patient will improve his timed up and go time to 12 seconds or less to  reduce his fall risk.  Baseline:  Goal status: INITIAL  3.  Patient will be able to transfer from sitting to standing without upper extremity support.  Baseline:  Goal status: INITIAL  4.  Patient will report being able to walk for least 10 minutes for improved function completing quick trips to the store. Baseline:  Goal status: INITIAL  5.  Patient will improve his gait speed to at least 0.8 m/s for improved household mobility. Baseline:  Goal status: INITIAL  PLAN:  PT FREQUENCY: 2x/week  PT DURATION: 6 weeks  PLANNED INTERVENTIONS: 97164- PT Re-evaluation, 97750- Physical Performance Testing, 97110-Therapeutic exercises, 97530- Therapeutic activity, 97112- Neuromuscular re-education, 97535- Self Care, [id number]- Manual therapy, [phone]- Gait training, Patient/Family education, Balance training, and Stair training  PLAN FOR NEXT SESSION: NuStep, isometrics, gait training, and lower extremity strengthening with upper extremity interventions as an active rest break, continue balance training   Delon DELENA Gosling, PTA 06/04/2024, 10:58 AM

## 2024-06-07 ENCOUNTER — Other Ambulatory Visit: Payer: Self-pay

## 2024-06-07 ENCOUNTER — Telehealth: Payer: Self-pay | Admitting: *Deleted

## 2024-06-07 ENCOUNTER — Other Ambulatory Visit: Payer: Self-pay | Admitting: *Deleted

## 2024-06-07 DIAGNOSIS — F05 Delirium due to known physiological condition: Secondary | ICD-10-CM

## 2024-06-07 DIAGNOSIS — R221 Localized swelling, mass and lump, neck: Secondary | ICD-10-CM

## 2024-06-07 DIAGNOSIS — R309 Painful micturition, unspecified: Secondary | ICD-10-CM

## 2024-06-07 DIAGNOSIS — C61 Malignant neoplasm of prostate: Secondary | ICD-10-CM

## 2024-06-07 NOTE — Telephone Encounter (Signed)
 Spoke to wife, Tillman via [facility] regarding declining mental status and confusion.  She indicated that she suspected he may have a UTI.  MRI of the brain has been scheduled, per Dr. Charmain recommendation.  Given that his mentation has declined more throughout the day, I adamantly suggested that he go to the ER for evaluation.  Patient refuses at this point.  He sees Dr. Rogers tomorrow.  Lab appointment added to visit for a UA.

## 2024-06-07 NOTE — Telephone Encounter (Signed)
**Note De-identified  [place] Obfuscation** Please advise 

## 2024-06-08 ENCOUNTER — Other Ambulatory Visit: Payer: Self-pay | Admitting: *Deleted

## 2024-06-08 ENCOUNTER — Inpatient Hospital Stay: Attending: Hematology | Admitting: Hematology

## 2024-06-08 ENCOUNTER — Inpatient Hospital Stay

## 2024-06-08 VITALS — BP 133/58 | HR 85 | Temp 97.6°F | Resp 18 | Wt 138.9 lb

## 2024-06-08 DIAGNOSIS — C775 Secondary and unspecified malignant neoplasm of intrapelvic lymph nodes: Secondary | ICD-10-CM | POA: Insufficient documentation

## 2024-06-08 DIAGNOSIS — C772 Secondary and unspecified malignant neoplasm of intra-abdominal lymph nodes: Secondary | ICD-10-CM

## 2024-06-08 DIAGNOSIS — Z79899 Other long term (current) drug therapy: Secondary | ICD-10-CM | POA: Insufficient documentation

## 2024-06-08 DIAGNOSIS — E611 Iron deficiency: Secondary | ICD-10-CM | POA: Diagnosis not present

## 2024-06-08 DIAGNOSIS — F17210 Nicotine dependence, cigarettes, uncomplicated: Secondary | ICD-10-CM | POA: Diagnosis not present

## 2024-06-08 DIAGNOSIS — M810 Age-related osteoporosis without current pathological fracture: Secondary | ICD-10-CM | POA: Insufficient documentation

## 2024-06-08 DIAGNOSIS — R300 Dysuria: Secondary | ICD-10-CM | POA: Diagnosis not present

## 2024-06-08 DIAGNOSIS — R309 Painful micturition, unspecified: Secondary | ICD-10-CM

## 2024-06-08 DIAGNOSIS — Z7952 Long term (current) use of systemic steroids: Secondary | ICD-10-CM | POA: Insufficient documentation

## 2024-06-08 DIAGNOSIS — Z7901 Long term (current) use of anticoagulants: Secondary | ICD-10-CM | POA: Insufficient documentation

## 2024-06-08 DIAGNOSIS — R221 Localized swelling, mass and lump, neck: Secondary | ICD-10-CM | POA: Insufficient documentation

## 2024-06-08 DIAGNOSIS — C61 Malignant neoplasm of prostate: Secondary | ICD-10-CM | POA: Insufficient documentation

## 2024-06-08 LAB — URINALYSIS, ROUTINE W REFLEX MICROSCOPIC
Bilirubin Urine: NEGATIVE
Glucose, UA: NEGATIVE mg/dL
Hgb urine dipstick: NEGATIVE
Ketones, ur: 5 mg/dL — AB
Leukocytes,Ua: NEGATIVE
Nitrite: NEGATIVE
Protein, ur: NEGATIVE mg/dL
Specific Gravity, Urine: 1.019 (ref 1.005–1.030)
pH: 5 (ref 5.0–8.0)

## 2024-06-08 NOTE — Patient Instructions (Addendum)
 [facility]  Discharge Instructions  You were seen and examined today by Dr. Rogers.  Dr. Rogers discussed your most recent lab work which revealed that everything is stable except your calcium is slightly elevated.  He is checking a urine culture and has you scheduled for MRI of your brain on 06/11/2024. Continue taking the Zytiga  and prednisone  as prescribed and getting injection with urology once a month.   Follow-up as scheduled.     Thank you for choosing [facility] to provide your oncology and hematology care.   To afford each patient quality time with our provider, please arrive at least 15 minutes before your scheduled appointment time. You may need to reschedule your appointment if you arrive late (10 or more minutes). Arriving late affects you and other patients whose appointments are after yours.  Also, if you miss three or more appointments without notifying the office, you may be dismissed from the clinic at the provider's discretion.    Again, thank you for choosing [facility].  Our hope is that these requests will decrease the amount of time that you wait before being seen by our physicians.   If you have a lab appointment with the Cancer Center - please note that after April 8th, all labs will be drawn in the cancer center.  You do not have to check in or register with the main entrance as you have in the past but will complete your check-in at the cancer center.            _____________________________________________________________  Should you have questions after your visit to [facility], please contact our office at [phone] and follow the prompts.  Our office hours are 8:00 a.m. to 4:30 p.m. Monday - Thursday and 8:00 a.m. to 2:30 p.m. Friday.  Please note that voicemails left after 4:00 p.m. may not be returned until the following business day.  We are closed weekends and  all major holidays.  You do have access to a nurse 24-7, just call the main number to the clinic [phone] and do not press any options, hold on the line and a nurse will answer the phone.    For prescription refill requests, have your pharmacy contact our office and allow 72 hours.    Masks are no longer required in the cancer centers. If you would like for your care team to wear a mask while they are taking care of you, please let them know. You may have one support person who is at least 77 years old accompany you for your appointments.

## 2024-06-08 NOTE — Progress Notes (Signed)
 [facility] [address], [place] [address]    Clinic Day:  06/10/2024  Referring physician: Toribio Jerel MATSU, MD  Patient Care Team: Toribio Jerel MATSU, MD as PCP - General (Family Medicine) Alvan Dorn FALCON, MD as PCP - Cardiology (Cardiology) Shaaron Lamar HERO, MD as Consulting Physician (Gastroenterology) Rogers Hai, MD as Medical Oncologist (Medical Oncology) Celestia Joesph SQUIBB, RN as Oncology Nurse Navigator (Medical Oncology) Darlean Ozell NOVAK, MD as Consulting Physician (Pulmonary Disease)   ASSESSMENT & PLAN:   Assessment: 1.  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 Hollis on 09/22/2018 - Final pathology: PT3b N0, Gleason 4+3 prostate adenocarcinoma, postoperative PSA remained detectable and rose to 5.7 - Axumin PET (06/2019): Local recurrence - Salvage XRT to the prostate fossa and pelvic lymph nodes, Dr. Harper from 07/05/2019 - 08/31/2019, without concurrent ADT.  PSA responded well reaching a nadir of 0.1. - PSA rose again to 3.4 in 06/2022, PSMA PET scan: Oligometastatic disease involving single internal iliac lymph node. - 07/29/2022 - 08/09/2022: 10 fx course of UHRT to right internal iliac node, PSA only decreased slightly to 3.1 in 10/2022, further went up to 5 on 12/05/2022 - PSMA PET (12/19/2022): New radiotracer avid 2 mm infrarenal retrocaval lymph node and a minimal increase size of the previously treated right common iliac lymph node measuring 3 mm compared to 2 mm previously, no evidence of recurrence in the prostatectomy bed or bone mets. - Loading dose of Firmagon  with Dr. Watt on 01/09/2023 - Abiraterone  and prednisone  started on 01/18/2023. - XRT to the retrocaval lymph node and right common iliac lymph node completed on 01/29/2023, 10 fractions   2.  Social/family history: - He lives with wife at home.  He is independent of all ADLs and IADLs.  Uses cane for longer  distances.  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  Current active smoker, 1 pack/day for 45 years. - Father had lung cancer and was smoker.    Plan: 1.  Metastatic CSPC to abdominal/high pelvic lymph node (low-volume): - He is continuing Firmagon  injections and abiraterone  500 mg daily with prednisone . - His wife reported that last week all of a sudden, he had an episode of confusion about taking his medications.  His wife thought that he had a UTI and gave him 1 dose of amoxicillin last night.  Today he is feeling better.  He denies any dysuria. - We have checked his UA from 06/08/2024: Negative for any acute infection.  Urine culture showed less than 10,000 colonies, insignificant growth. - I do not see any reason to treat this. - He has severely decreased hearing which could be contributing to his confusion.  I have recommended audiology evaluation. - We have scheduled MRI of the brain on 06/11/2024. - We will call with the report of the MRI.  Otherwise he will follow-up with us  in 3 months.  Continue abiraterone  on Firmagon .   2.  Iron  deficiency state: - Last infusion of INFeD  on 05/06/2024.  Ferritin improved to 514 from 131.  Hemoglobin is stable at 10.4.  Will repeat in 3 months.   3.  Mild hypercalcemia: - He has mild intermittent hypercalcemia.  He takes vitamin D 1000 units daily.   4.  Osteoporosis (DEXA on 03/17/2023 T score -4): - Received 1 dose of Prolia  on 04/03/2023 and decided not to continue  it.  5.  Right neck mass: - He has noticed right neck swelling for the past 3 months. 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  No cervical adenopathy. - He has seen Dr. Carlie and decided to watch and wait approach.   6.  JAK2 V617F/BCR-ABL negative leukocytosis and thrombocytosis: - He has intermittent leukocytosis and thrombocytosis,  likely reactive from smoking and prednisone .    No orders of the defined types were placed in this encounter.     Dan [name],acting as a Neurosurgeon for Alean Stands, MD.,have documented all relevant documentation on the behalf of Alean Stands, MD,as directed by  Alean Stands, MD while in the presence of Alean Stands, MD.  I, Alean Stands MD, have reviewed the above documentation for accuracy and completeness, and I agree with the above.      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 RALP 09/22/18 (Dr. Hollis) 2. Salvage XRT + ADT 07/05/2019 - 08/31/2019 (Dr. Harper) 3. UHRT to right internal iliac node 07/29/22 - 08/09/22 4. UHRT to infrarenal retrocaval lymph node 01/16/23 - 01/29/23  Current Therapy:  Firmagon  and Abiraterone     HISTORY OF PRESENT ILLNESS:   Oncology History   No history exists.     INTERVAL HISTORY:   Dan [name] is a 78 y.o. 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 He was last seen by me on 05/04/2024.  Today, he states that he is doing well overall. His appetite level is at 75%. His energy level is at 0%. He is accompanied by his wife.   Dan [name] notes on and off dysuria since last week and took one dose of amoxicillin yesterday. He also has sudden onset associated mental confusion beginning last week in terms of his medications and mixing up or forgetting words during conversations. His wife states he does not seem like himself, though the patient denies any behavioral changes. Dan [name] notes he had a fall  2 months ago and hit his head at that time. His wife reports he does not drink an adequate amount of liquids. He denies any recent infections, productive cough, or other new onset pains.  His wife notes Dan [name] has impaired hearing, which could be contributing to mental confusion. She states she will get him a hearing aid today and will see an audiologist today as well.   Dan [name] takes Vitamin D 5000 units daily. He is taking Zytiga , prednisone , and ritalin  as prescribed.   PAST MEDICAL HISTORY:   Past Medical History: Past Medical History:  Diagnosis Date   Anemia    Arthritis    Asthma    BPH (benign prostatic hyperplasia)    COPD (chronic obstructive pulmonary disease) (HCC)    Depression    ED (erectile dysfunction)    Gout    Hyperlipidemia    Hypertension    Loss of hearing    Nocturia    Paroxysmal atrial fibrillation (HCC)    Prostate cancer [facility])     Surgical History: Past Surgical History:  Procedure Laterality  Date   CARDIOVASCULAR STRESS TEST  12/31/2004   normal perfuction nuclear study w/ no evidence ischemia /  normal LV function and wall motion , ef 52%   PROSTATECTOMY  2019   TOTAL HIP ARTHROPLASTY Left    TOTAL HIP ARTHROPLASTY Right 01/20/2024   Procedure: ARTHROPLASTY, HIP, TOTAL, ANTERIOR APPROACH;  Surgeon: Ernie Cough, MD;  Location: [facility];  Service: Orthopedics;  Laterality: Right;    Social History: Social History   Socioeconomic History   Marital status: Married    Spouse name: Not on file   Number of children: Not on file   Years of education: Not on file   Highest education level: Not on file  Occupational History   Not on file  Tobacco Use   Smoking status: Every Day    Current packs/day: 0.50    Average packs/day: 0.5 packs/day for 57.0 years (28.5 ttl pk-yrs)    Types: Cigarettes    Start date: 06/12/1967    Passive exposure: Never   Smokeless tobacco: Never   Tobacco comments:    Smoking 1/2 ppd.  Trying to stop.  05/17/2024 hfb RN   Vaping Use   Vaping status: Never Used  Substance and Sexual Activity   Alcohol  use: Not Currently   Drug use: Not Currently   Sexual activity: Not Currently  Other Topics Concern   Not on file  Social History Narrative   Not on file   Social Drivers of Health   Financial Resource Strain: Not on file  Food Insecurity: No Food Insecurity (01/20/2024)   Hunger Vital Sign    Worried About Running Out of Food in the Last Year: Never true    Ran Out of Food in the Last Year: Never true  Transportation Needs: No Transportation Needs (01/20/2024)   PRAPARE - Administrator, Civil Service (Medical): No    Lack of Transportation (Non-Medical): No  Physical Activity: Not on file  Stress: Not on file  Social Connections: Unknown (01/20/2024)   Social Connection and Isolation Panel    Frequency of Communication with Friends and Family: Patient declined    Frequency of Social Gatherings with Friends and Family: Patient declined    Attends Religious Services: Patient declined    Database administrator or Organizations: Patient declined    Attends Banker Meetings: Patient declined    Marital Status: Married  Catering manager Violence: Not At Risk (01/20/2024)   Humiliation, Afraid, Rape, and Kick questionnaire    Fear of Current or Ex-Partner: No    Emotionally Abused: No    Physically Abused: No    Sexually Abused: No    Family History: Family History  Problem Relation Age of Onset   Hypertension Mother    Cancer Father        lung   Colon cancer Neg Hx    Inflammatory bowel disease Neg Hx    Pancreatic cancer Neg Hx     Current Medications:  Current Outpatient Medications:    abiraterone  acetate (ZYTIGA ) 250 MG tablet, Take 4 tablets (1,000 mg total) by mouth daily. Take on an empty stomach 1 hour before or 2 hours after a meal, Disp: 120 tablet, Rfl: 3   allopurinol  (ZYLOPRIM ) 100 MG tablet, Take 100 mg by mouth daily., Disp: , Rfl:    Ascorbic Acid  (VITAMIN C) 1000 MG tablet, Take 1,000 mg by mouth daily., Disp: , Rfl:    cholecalciferol (VITAMIN D3) 25 MCG (1000 UNIT) tablet, Take  1,000 Units by mouth daily., Disp: , Rfl:    diltiazem  (CARDIZEM  CD) 360 MG 24 hr capsule, Take 360 mg by mouth daily., Disp: , Rfl:    HYDROcodone -acetaminophen  (NORCO) 10-325 MG tablet, Take 1 tablet by mouth every 6 (six) hours as needed., Disp: 30 tablet, Rfl: 0   megestrol  (MEGACE ) 40 MG/ML suspension, Take 400 mg by mouth daily as needed (appetite)., Disp: , Rfl:    methylphenidate  (RITALIN ) 5 MG tablet, Take 1 tablet (5 mg total) by mouth 2 (two) times daily as needed. Take 1 tab with breakfast and 1 tab with lunch as needed, Disp: 60 tablet, Rfl: 0   predniSONE  (DELTASONE ) 5 MG tablet, Take 1 tablet (5 mg total) by mouth daily with breakfast., Disp: 90 tablet, Rfl: 1   TRELEGY ELLIPTA 200-62.5-25 MCG/ACT AEPB, Inhale 1 puff into the lungs daily., Disp: , Rfl:    Zinc 20 MG CAPS, Take 40 mg by mouth daily., Disp: , Rfl:    budesonide-glycopyrrolate-formoterol (BREZTRI  AEROSPHERE) 160-9-4.8 MCG/ACT AERO inhaler, Inhale 2 puffs into the lungs in the morning and at bedtime., Disp: , Rfl:    losartan  (COZAAR ) 25 MG tablet, TAKE 1 TABLET (25 MG TOTAL) BY MOUTH DAILY. (Patient not taking: Reported on 06/08/2024), Disp: 90 tablet, Rfl: 1   magnesium  oxide (MAG-OX) 400 (240 Mg) MG tablet, TAKE 1 TABLET BY MOUTH EVERY DAY, Disp: 90 tablet, Rfl: 1   methocarbamol  (ROBAXIN ) 500 MG tablet, Take 1 tablet (500 mg total) by mouth every 6 (six) hours as needed for muscle spasms., Disp: 40 tablet, Rfl: 2   mirtazapine (REMERON) 30 MG tablet, Take 30 mg by mouth at bedtime., Disp: , Rfl:    polyethylene glycol (MIRALAX  / GLYCOLAX ) 17 g packet, Take 17 g by mouth 2 (two) times daily. (Patient not taking: Reported on 06/08/2024), Disp: 14 each, Rfl: 0   potassium chloride  (MICRO-K ) 10 MEQ CR capsule, TAKE 2 CAPSULES BY MOUTH DAILY. (Patient not taking: Reported on 06/08/2024), Disp: 7  capsule, Rfl: 0   rivaroxaban  (XARELTO ) 10 MG TABS tablet, Take 1 tablet (10 mg total) by mouth daily with breakfast for 21 days. (Patient not taking: Reported on 06/08/2024), Disp: 21 tablet, Rfl: 0  Current Facility-Administered Medications:    degarelix  (FIRMAGON ) injection 80 mg, 80 mg, Subcutaneous, Q28 days, Wrenn, John, MD, 80 mg at 05/13/24 1000   Allergies: Allergies  Allergen Reactions   Tamsulosin Hives and Itching    REVIEW OF SYSTEMS:   Review of Systems  Constitutional:  Negative for chills, fatigue and fever.  HENT:   Negative for lump/mass, mouth sores, nosebleeds, sore throat and trouble swallowing.   Eyes:  Negative for eye problems.  Respiratory:  Positive for shortness of breath. Negative for cough.   Cardiovascular:  Negative for chest pain, leg swelling and palpitations.  Gastrointestinal:  Negative for abdominal pain, constipation, diarrhea, nausea and vomiting.  Genitourinary:  Positive for dysuria. Negative for bladder incontinence, difficulty urinating, frequency, hematuria and nocturia.   Musculoskeletal:  Positive for back pain. Negative for arthralgias, flank pain, myalgias and neck pain.  Skin:  Negative for itching and rash.  Neurological:  Negative for dizziness, headaches and numbness.  Hematological:  Does not bruise/bleed easily.  Psychiatric/Behavioral:  Positive for sleep disturbance. Negative for depression and suicidal ideas. The patient is not nervous/anxious.   All other systems reviewed and are negative.    VITALS:   Blood pressure (!) 133/58, pulse 85, temperature 97.6 F (36.4 C), resp. rate 18, weight 138  lb 14.2 oz (63 kg), SpO2 100%.  Wt Readings from Last 3 Encounters:  06/08/24 138 lb 14.2 oz (63 kg)  05/17/24 138 lb (62.6 kg)  05/12/24 140 lb (63.5 kg)    Body mass index is 19.93 kg/m.  Performance status (ECOG): 1 - Symptomatic but completely ambulatory  PHYSICAL EXAM:   Physical Exam Vitals and nursing note reviewed.  Exam conducted with a chaperone present.  Constitutional:      Appearance: Normal appearance.  Cardiovascular:     Rate and Rhythm: Normal rate and regular rhythm.     Pulses: Normal pulses.     Heart sounds: Normal heart sounds.  Pulmonary:     Effort: Pulmonary effort is normal.     Breath sounds: Normal breath sounds.  Abdominal:     Palpations: Abdomen is soft. There is no hepatomegaly, splenomegaly or mass.     Tenderness: There is no abdominal tenderness.  Musculoskeletal:     Right lower leg: No edema.     Left lower leg: No edema.  Lymphadenopathy:     Cervical: No cervical adenopathy.     Right cervical: No superficial, deep or posterior cervical adenopathy.    Left cervical: No superficial, deep or posterior cervical adenopathy.     Upper Body:     Right upper body: No supraclavicular or axillary adenopathy.     Left upper body: No supraclavicular or axillary adenopathy.  Neurological:     General: No focal deficit present.     Mental Status: He is alert and oriented to person, place, and time.  Psychiatric:        Mood and Affect: Mood normal.        Behavior: Behavior normal.     LABS:      Latest Ref Rng & Units 06/01/2024   10:53 AM 05/17/2024    4:46 PM 05/04/2024    2:46 PM  CBC  WBC 4.0 - 10.5 K/uL 13.4  13.1  9.4   Hemoglobin 13.0 - 17.0 g/dL 89.5  89.4  89.4   Hematocrit 39.0 - 52.0 % 32.3  33.0  31.9   Platelets 150 - 400 K/uL 515  484  370       Latest Ref Rng & Units 06/01/2024   10:53 AM 05/04/2024    2:46 PM 03/02/2024   10:49 AM  CMP  Glucose 70 - 99 mg/dL 889  884  889   BUN 8 - 23 mg/dL 18  19  12    Creatinine 0.61 - 1.24 mg/dL 8.88  8.94  8.90   Sodium 135 - 145 mmol/L 133  135  130   Potassium 3.5 - 5.1 mmol/L 3.8  3.7  3.9   Chloride 98 - 111 mmol/L 99  103  95   CO2 22 - 32 mmol/L 21  20  24    Calcium 8.9 - 10.3 mg/dL 89.4  89.7  89.7   Total Protein 6.5 - 8.1 g/dL 7.1  6.6  6.3   Total Bilirubin 0.0 - 1.2 mg/dL 0.5  0.6  0.5    Alkaline Phos 38 - 126 U/L 74  63  83   AST 15 - 41 U/L 16  15  13    ALT 0 - 44 U/L 8  11  10       No results found for: CEA1, CEA / No results found for: CEA1, CEA Lab Results  Component Value Date   PSA1 <0.1 08/14/2023   No results found for: CAN199  No results found for: CAN125  No results found for: STEPHANY CARLOTA BENSON MARKEL EARLA JOANNIE, GAMS, MSPIKE, SPEI Lab Results  Component Value Date   TIBC 293 06/01/2024   TIBC 324 05/04/2024   TIBC 256 03/02/2024   FERRITIN 514 (H) 06/01/2024   FERRITIN 131 05/04/2024   FERRITIN 213 03/02/2024   IRONPCTSAT 29 06/01/2024   IRONPCTSAT 15 (L) 05/04/2024   IRONPCTSAT 13 (L) 03/02/2024   Lab Results  Component Value Date   LDH 99 12/03/2023     STUDIES:   DG Shoulder Right Result Date: 05/12/2024 Clinical:  fall on right shoulder last week pain X-rays were done of the right shoulder, two views. The humeral head is well located within the glenoid area.  He has significant degenerative joint disease of the glenoid humeral joint.  No fracture is seen. Bone quality is good.  Apex of lung is clear. Impression:  significant glenohumeral degenerative joint disease of the right shoulder, no acute findings. Electronically Signed Lemond Stable, MD 7/9/202510:04 AM

## 2024-06-09 ENCOUNTER — Other Ambulatory Visit: Payer: Self-pay | Admitting: *Deleted

## 2024-06-09 ENCOUNTER — Other Ambulatory Visit: Payer: Self-pay

## 2024-06-09 ENCOUNTER — Ambulatory Visit

## 2024-06-09 DIAGNOSIS — M810 Age-related osteoporosis without current pathological fracture: Secondary | ICD-10-CM

## 2024-06-09 DIAGNOSIS — D508 Other iron deficiency anemias: Secondary | ICD-10-CM

## 2024-06-09 DIAGNOSIS — C772 Secondary and unspecified malignant neoplasm of intra-abdominal lymph nodes: Secondary | ICD-10-CM

## 2024-06-09 LAB — URINE CULTURE: Culture: <10000 — AB

## 2024-06-10 ENCOUNTER — Encounter: Payer: Self-pay | Admitting: Hematology

## 2024-06-11 ENCOUNTER — Ambulatory Visit

## 2024-06-11 ENCOUNTER — Ambulatory Visit (HOSPITAL_COMMUNITY)
Admission: RE | Admit: 2024-06-11 | Discharge: 2024-06-11 | Disposition: A | Discharge status: Home / Self Care | Source: Ambulatory Visit | Attending: Hematology | Admitting: Hematology

## 2024-06-11 DIAGNOSIS — M6281 Muscle weakness (generalized): Secondary | ICD-10-CM

## 2024-06-11 DIAGNOSIS — R221 Localized swelling, mass and lump, neck: Secondary | ICD-10-CM | POA: Diagnosis not present

## 2024-06-11 DIAGNOSIS — C772 Secondary and unspecified malignant neoplasm of intra-abdominal lymph nodes: Secondary | ICD-10-CM | POA: Insufficient documentation

## 2024-06-11 DIAGNOSIS — C775 Secondary and unspecified malignant neoplasm of intrapelvic lymph nodes: Secondary | ICD-10-CM | POA: Diagnosis not present

## 2024-06-11 DIAGNOSIS — R410 Disorientation, unspecified: Secondary | ICD-10-CM | POA: Diagnosis not present

## 2024-06-11 DIAGNOSIS — C61 Malignant neoplasm of prostate: Secondary | ICD-10-CM | POA: Insufficient documentation

## 2024-06-11 DIAGNOSIS — R9089 Other abnormal findings on diagnostic imaging of central nervous system: Secondary | ICD-10-CM | POA: Diagnosis not present

## 2024-06-11 MED ORDER — GADOBUTROL 1 MMOL/ML IV SOLN
6.0000 mL | Freq: Once | INTRAVENOUS | Status: AC | PRN
  Administered 2024-06-11: 6 mL via INTRAVENOUS

## 2024-06-11 NOTE — Therapy (Signed)
 OUTPATIENT PHYSICAL THERAPY LOWER EXTREMITY TREATMENT   Patient Name: Dan [name] MRN: [id number] DOB:1946/07/05, 78 y.o., male Today's Date: 06/11/2024  END OF SESSION:  PT End of Session - 06/11/24 1156     Visit Number 5    Number of Visits 12    Date for PT Re-Evaluation 07/30/24    PT Start Time 1145    PT Stop Time 1214    PT Time Calculation (min) 29 min    Activity Tolerance Patient tolerated treatment well    Behavior During Therapy WFL for tasks assessed/performed          Past Medical History:  Diagnosis Date   Anemia    Arthritis    Asthma    BPH (benign prostatic hyperplasia)    COPD (chronic obstructive pulmonary disease) (HCC)    Depression    ED (erectile dysfunction)    Gout    Hyperlipidemia    Hypertension    Loss of hearing    Nocturia    Paroxysmal atrial fibrillation (HCC)    Prostate cancer (HCC)    Past Surgical History:  Procedure Laterality Date   CARDIOVASCULAR STRESS TEST  12/31/2004   normal perfuction nuclear study w/ no evidence ischemia /  normal LV function and wall motion , ef 52%   PROSTATECTOMY  2019   TOTAL HIP ARTHROPLASTY Left    TOTAL HIP ARTHROPLASTY Right 01/20/2024   Procedure: ARTHROPLASTY, HIP, TOTAL, ANTERIOR APPROACH;  Surgeon: Ernie Cough, MD;  Location: [facility];  Service: Orthopedics;  Laterality: Right;   Patient Active Problem List   Diagnosis Date Noted   Osteonecrosis of hip (HCC) 05/12/2024   S/P total right hip arthroplasty 01/20/2024   Osteoporosis 03/26/2023   Genetic testing 03/13/2023   Malignant neoplasm of prostate metastatic to intra-abdominal lymph node (HCC) 12/31/2022   Joint pain 07/03/2021   Other specified abnormal immunological findings in serum 07/03/2021   Primary osteoarthritis 07/03/2021   Abnormal CT scan, colon 01/22/2021   History of Clostridioides difficile colitis 01/22/2021   Diarrhea 01/22/2021   Dilated pancreatic duct 01/22/2021   Loss of appetite 11/27/2020   GERD  (gastroesophageal reflux disease) 11/11/2020   Hematochezia 11/11/2020   History of prostate cancer 11/11/2020   Chest wall pain 10/31/2020   Anemia 08/02/2020   Other specified personal risk factors, not elsewhere classified 07/18/2020   Body mass index (BMI) 22.0-22.9, adult 04/20/2020   PMR (polymyalgia rheumatica) (HCC) 03/29/2020   Fatigue 03/28/2020   Chronic diastolic CHF (congestive heart failure) (HCC) 03/09/2020   Atrial fibrillation (HCC) 12/15/2019   Claudication (HCC) 12/15/2019   Palpitations 12/15/2019   Cystitis 09/20/2019   Cigarette smoker 07/02/2019   Malignant neoplasm of prostate metastatic to intrapelvic lymph node (HCC) 09/23/2018   Elevated PSA 05/27/2018   Hyperlipidemia    Hypertension    COPD GOLD ? / Group E    Depression    Gout    Alcohol  dependency (HCC)    ED (erectile dysfunction)    Loss of hearing     PCP: Toribio Jerel MATSU, MD  REFERRING PROVIDER: Toribio Jerel MATSU, MD  REFERRING DIAG: generalized weakness   THERAPY DIAG:  Muscle weakness (generalized)  Rationale for Evaluation and Treatment: Rehabilitation  ONSET DATE: 1 year ago  SUBJECTIVE:   SUBJECTIVE STATEMENT: Patient presents to clinic stating they aren't in any pain, but he is having a rough week and not feeling the best.  PERTINENT HISTORY: Hypertension, atrial fibrillation, COPD, osteoarthritis, congestive heart failure, osteoporosis,  depression, prostate cancer, current smoker, asthma, depression, and hard of hearing PAIN:  Are you having pain? No  PRECAUTIONS: Fall  RED FLAGS: None   WEIGHT BEARING RESTRICTIONS: No  FALLS:  Has patient fallen in last 6 months? Yes. Number of falls 4  LIVING ENVIRONMENT: Lives with: lives with their spouse Lives in: House/apartment Stairs: Yes: External: 3 steps; can reach both; step to pattern Has following equipment at home: Single point cane  OCCUPATION: disabled   PLOF: Independent  PATIENT GOALS: improved mobility  and lower extremity strength  NEXT MD VISIT: 06/08/24  OBJECTIVE:  Note: Objective measures were completed at Evaluation unless otherwise noted.  COGNITION: Overall cognitive status: Within functional limits for tasks assessed     SENSATION: Light touch: Impaired with diminished sensation in both feet.  Patient reports intermittent tingling in both feet, but none currently  EDEMA:  Moderate bilateral ankle edema observed  POSTURE: rounded shoulders, forward head, and flexed trunk   LOWER EXTREMITY ROM:   LOWER EXTREMITY MMT:  MMT Right eval Left eval  Hip flexion 3+/5 3/5  Hip extension    Hip abduction    Hip adduction    Hip internal rotation    Hip external rotation    Knee flexion 4+/5 4/5  Knee extension 4-/5 4-/5  Ankle dorsiflexion 3+/5 3+/5  Ankle plantarflexion    Ankle inversion    Ankle eversion     (Blank rows = not tested)  FUNCTIONAL TESTS:  Timed up and go (TUG): 23.37 seconds with SPC  Sit to stand: requires UE support from armrests and fatigued after 1 repetition  GAIT: Assistive device utilized: Single point cane Level of assistance: Modified independence Comments: Decreased stride length and foot clearance bilaterally   Gait speed: 0.47 m/s with single point cane                                                                                                                               TREATMENT DATE:   06/11/2024                                    EXERCISE LOG  Exercise Repetitions and Resistance Comments  Nustep  X10 mins   Seated marching 1x45s 3#   LAQ 2x30s 3#   Resisted dorsiflexion 2x12 w/red tb   Hip add iso 2x60s    Hip abd iso 2x60s   Heel raises  1x20    Blank cell = exercise not performed today  06/04/2024                                   EXERCISE LOG  Exercise Repetitions and Resistance Comments  Nustep L1 x 13.5 mins (1 lap)   Seated marching 2# 2x45s   Seated LAQ 2# 2x45s  Hip add isometrics 1x45s    Hip abd  isometrics    Seated hamstring curls    Heel/Toe Raises             Blank cell = exercise not performed today   PATIENT EDUCATION:  Education details: POC, prognosis, objective findings, safety, fall risk, and goals for physical therapy Person educated: Patient Education method: Explanation Education comprehension: verbalized understanding  HOME EXERCISE PROGRAM:   ASSESSMENT:  CLINICAL IMPRESSION:  Pt arrives for today's treatment session denying any pain, but feeling tired today. Patient was able to increase wight for LE strengthening today. Patient progressed with hamstring curls which was easier for them than quad strengthening exercises today. Patient requested session ending early due to fatigue and having a tough week. Patient continues to require skilled physical therapy to address impairments below.  OBJECTIVE IMPAIRMENTS: Abnormal gait, decreased activity tolerance, decreased balance, decreased mobility, difficulty walking, decreased strength, impaired sensation, and pain.   ACTIVITY LIMITATIONS: standing, stairs, transfers, and locomotion level  PARTICIPATION LIMITATIONS: meal prep, cleaning, laundry, community activity, and yard work  PERSONAL FACTORS: Fitness, Past/current experiences, Time since onset of injury/illness/exacerbation, Transportation, and 3+ comorbidities: Hypertension, atrial fibrillation, COPD, osteoarthritis, congestive heart failure, osteoporosis, depression, prostate cancer, current smoker, asthma, depression, and hard of hearing are also affecting patient's functional outcome.   REHAB POTENTIAL: Good  CLINICAL DECISION MAKING: Evolving/moderate complexity  EVALUATION COMPLEXITY: Moderate   GOALS: Goals reviewed with patient? Yes  SHORT TERM GOALS: Target date: 06/09/24 Patient will be independent with is initial HEP.  Baseline: Goal status: INITIAL  2.  Patient will report being able to walk for at least 5 minutes for improved functional  mobility. Baseline:  Goal status: INITIAL  3.  Patient will improve his gait speed to at least 0.60 m/s for improved functional mobility.  Baseline:  Goal status: INITIAL  LONG TERM GOALS: Target date: 06/30/24  Patient will be independent with his advanced HEP.  Baseline:  Goal status: INITIAL  2.  Patient will improve his timed up and go time to 12 seconds or less to reduce his fall risk.  Baseline:  Goal status: INITIAL  3.  Patient will be able to transfer from sitting to standing without upper extremity support.  Baseline:  Goal status: INITIAL  4.  Patient will report being able to walk for least 10 minutes for improved function completing quick trips to the store. Baseline:  Goal status: INITIAL  5.  Patient will improve his gait speed to at least 0.8 m/s for improved household mobility. Baseline:  Goal status: INITIAL  PLAN:  PT FREQUENCY: 2x/week  PT DURATION: 6 weeks  PLANNED INTERVENTIONS: 02835- PT Re-evaluation, 97750- Physical Performance Testing, 97110-Therapeutic exercises, 97530- Therapeutic activity, 97112- Neuromuscular re-education, 97535- Self Care, 02859- Manual therapy, [phone]- Gait training, Patient/Family education, Balance training, and Stair training  PLAN FOR NEXT SESSION: NuStep, isometrics, gait training, and lower extremity strengthening with upper extremity interventions as an active rest break, continue balance training   Estefana Jude, Student-PT 06/11/2024, 12:34 PM

## 2024-06-15 ENCOUNTER — Ambulatory Visit

## 2024-06-15 ENCOUNTER — Inpatient Hospital Stay

## 2024-06-15 DIAGNOSIS — C61 Malignant neoplasm of prostate: Secondary | ICD-10-CM | POA: Diagnosis not present

## 2024-06-15 DIAGNOSIS — C775 Secondary and unspecified malignant neoplasm of intrapelvic lymph nodes: Secondary | ICD-10-CM

## 2024-06-15 MED ORDER — DEGARELIX ACETATE 80 MG ~~LOC~~ SOLR
80.0000 mg | Freq: Once | SUBCUTANEOUS | Status: AC
  Administered 2024-06-15 (×2): 80 mg via SUBCUTANEOUS

## 2024-06-15 NOTE — Progress Notes (Signed)
 Firmagon  Sub Q Injection  Due to Prostate Cancer patient is present today for monthly Firmagon  Injection.  Order received and reviewed and authorization verification reviewed.   Medication: Firmagon  (Degarelix )  Dose: 80 mg Location: left upper abdomen cleaned and prepped with alcohol  prior to injection Lot: [id number] Exp: 12/05/2025  Patient tolerated well, no complications were noted Band aid applied over injection site.   Performed by: Carlos, CMA  Follow up: 1 month for injection. Appointment scheduled with patient.

## 2024-06-18 ENCOUNTER — Other Ambulatory Visit: Payer: Self-pay | Admitting: *Deleted

## 2024-06-18 ENCOUNTER — Ambulatory Visit: Admitting: Physical Therapy

## 2024-06-18 ENCOUNTER — Other Ambulatory Visit: Payer: Self-pay

## 2024-06-18 DIAGNOSIS — M6281 Muscle weakness (generalized): Secondary | ICD-10-CM | POA: Diagnosis not present

## 2024-06-18 MED ORDER — METHYLPHENIDATE HCL 5 MG PO TABS: 5.0000 mg | ORAL_TABLET | Freq: Two times a day (BID) | ORAL | 0 refills | Status: DC | PRN

## 2024-06-18 NOTE — Therapy (Signed)
 OUTPATIENT PHYSICAL THERAPY LOWER EXTREMITY TREATMENT   Patient Name: Dan [name] MRN: [id number] DOB:1945/12/19, 78 y.o., male Today's Date: 06/18/2024  END OF SESSION:  PT End of Session - 06/18/24 1224     Visit Number 6    Number of Visits 12    Date for PT Re-Evaluation 07/30/24    PT Start Time 1145    PT Stop Time 1207    PT Time Calculation (min) 22 min    Activity Tolerance Patient limited by fatigue    Behavior During Therapy [facility] for tasks assessed/performed           Past Medical History:  Diagnosis Date   Anemia    Arthritis    Asthma    BPH (benign prostatic hyperplasia)    COPD (chronic obstructive pulmonary disease) (HCC)    Depression    ED (erectile dysfunction)    Gout    Hyperlipidemia    Hypertension    Loss of hearing    Nocturia    Paroxysmal atrial fibrillation (HCC)    Prostate cancer (HCC)    Past Surgical History:  Procedure Laterality Date   CARDIOVASCULAR STRESS TEST  12/31/2004   normal perfuction nuclear study w/ no evidence ischemia /  normal LV function and wall motion , ef 52%   PROSTATECTOMY  2019   TOTAL HIP ARTHROPLASTY Left    TOTAL HIP ARTHROPLASTY Right 01/20/2024   Procedure: ARTHROPLASTY, HIP, TOTAL, ANTERIOR APPROACH;  Surgeon: Ernie Cough, MD;  Location: [facility];  Service: Orthopedics;  Laterality: Right;   Patient Active Problem List   Diagnosis Date Noted   Osteonecrosis of hip (HCC) 05/12/2024   S/P total right hip arthroplasty 01/20/2024   Osteoporosis 03/26/2023   Genetic testing 03/13/2023   Malignant neoplasm of prostate metastatic to intra-abdominal lymph node (HCC) 12/31/2022   Joint pain 07/03/2021   Other specified abnormal immunological findings in serum 07/03/2021   Primary osteoarthritis 07/03/2021   Abnormal CT scan, colon 01/22/2021   History of Clostridioides difficile colitis 01/22/2021   Diarrhea 01/22/2021   Dilated pancreatic duct 01/22/2021   Loss of appetite 11/27/2020   GERD  (gastroesophageal reflux disease) 11/11/2020   Hematochezia 11/11/2020   History of prostate cancer 11/11/2020   Chest wall pain 10/31/2020   Anemia 08/02/2020   Other specified personal risk factors, not elsewhere classified 07/18/2020   Body mass index (BMI) 22.0-22.9, adult 04/20/2020   PMR (polymyalgia rheumatica) (HCC) 03/29/2020   Fatigue 03/28/2020   Chronic diastolic CHF (congestive heart failure) (HCC) 03/09/2020   Atrial fibrillation (HCC) 12/15/2019   Claudication (HCC) 12/15/2019   Palpitations 12/15/2019   Cystitis 09/20/2019   Cigarette smoker 07/02/2019   Malignant neoplasm of prostate metastatic to intrapelvic lymph node (HCC) 09/23/2018   Elevated PSA 05/27/2018   Hyperlipidemia    Hypertension    COPD GOLD ? / Group E    Depression    Gout    Alcohol  dependency (HCC)    ED (erectile dysfunction)    Loss of hearing     PCP: Toribio Jerel MATSU, MD  REFERRING PROVIDER: Toribio Jerel MATSU, MD  REFERRING DIAG: generalized weakness   THERAPY DIAG:  Muscle weakness (generalized)  Rationale for Evaluation and Treatment: Rehabilitation  ONSET DATE: 1 year ago  SUBJECTIVE:   SUBJECTIVE STATEMENT: Tired.  PERTINENT HISTORY: Hypertension, atrial fibrillation, COPD, osteoarthritis, congestive heart failure, osteoporosis, depression, prostate cancer, current smoker, asthma, depression, and hard of hearing PAIN:  Are you having pain? No  PRECAUTIONS:  Fall  RED FLAGS: None   WEIGHT BEARING RESTRICTIONS: No  FALLS:  Has patient fallen in last 6 months? Yes. Number of falls 4  LIVING ENVIRONMENT: Lives with: lives with their spouse Lives in: House/apartment Stairs: Yes: External: 3 steps; can reach both; step to pattern Has following equipment at home: Single point cane  OCCUPATION: disabled   PLOF: Independent  PATIENT GOALS: improved mobility and lower extremity strength  NEXT MD VISIT: 06/08/24  OBJECTIVE:  Note: Objective measures were completed  at Evaluation unless otherwise noted.  COGNITION: Overall cognitive status: Within functional limits for tasks assessed     SENSATION: Light touch: Impaired with diminished sensation in both feet.  Patient reports intermittent tingling in both feet, but none currently  EDEMA:  Moderate bilateral ankle edema observed  POSTURE: rounded shoulders, forward head, and flexed trunk   LOWER EXTREMITY ROM:   LOWER EXTREMITY MMT:  MMT Right eval Left eval  Hip flexion 3+/5 3/5  Hip extension    Hip abduction    Hip adduction    Hip internal rotation    Hip external rotation    Knee flexion 4+/5 4/5  Knee extension 4-/5 4-/5  Ankle dorsiflexion 3+/5 3+/5  Ankle plantarflexion    Ankle inversion    Ankle eversion     (Blank rows = not tested)  FUNCTIONAL TESTS:  Timed up and go (TUG): 23.37 seconds with SPC  Sit to stand: requires UE support from armrests and fatigued after 1 repetition  GAIT: Assistive device utilized: Single point cane Level of assistance: Modified independence Comments: Decreased stride length and foot clearance bilaterally   Gait speed: 0.47 m/s with single point cane                                                                                                                               TREATMENT DATE:     06/18/24:  Nustep level 1 x 2 x 15 minutes with frequent rest breaks.   06/11/2024                                    EXERCISE LOG  Exercise Repetitions and Resistance Comments  Nustep  X10 mins   Seated marching 1x45s 3#   LAQ 2x30s 3#   Resisted dorsiflexion 2x12 w/red tb   Hip add iso 2x60s    Hip abd iso 2x60s   Heel raises  1x20    Blank cell = exercise not performed today  06/04/2024                                   EXERCISE LOG  Exercise Repetitions and Resistance Comments  Nustep L1 x 13.5 mins (1 lap)   Seated marching 2# 2x45s   Seated LAQ 2# 2x45s   Hip add  isometrics 1x45s    Hip abd isometrics    Seated hamstring  curls    Heel/Toe Raises             Blank cell = exercise not performed today   PATIENT EDUCATION:  Education details: POC, prognosis, objective findings, safety, fall risk, and goals for physical therapy Person educated: Patient Education method: Explanation Education comprehension: verbalized understanding  HOME EXERCISE PROGRAM:   ASSESSMENT:  CLINICAL IMPRESSION: Patient perform Nustep at Level 1 and 2 over 15 minutes with multiple rests and requested to be done with his session after this exercise.   OBJECTIVE IMPAIRMENTS: Abnormal gait, decreased activity tolerance, decreased balance, decreased mobility, difficulty walking, decreased strength, impaired sensation, and pain.   ACTIVITY LIMITATIONS: standing, stairs, transfers, and locomotion level  PARTICIPATION LIMITATIONS: meal prep, cleaning, laundry, community activity, and yard work  PERSONAL FACTORS: Fitness, Past/current experiences, Time since onset of injury/illness/exacerbation, Transportation, and 3+ comorbidities: Hypertension, atrial fibrillation, COPD, osteoarthritis, congestive heart failure, osteoporosis, depression, prostate cancer, current smoker, asthma, depression, and hard of hearing are also affecting patient's functional outcome.   REHAB POTENTIAL: Good  CLINICAL DECISION MAKING: Evolving/moderate complexity  EVALUATION COMPLEXITY: Moderate   GOALS: Goals reviewed with patient? Yes  SHORT TERM GOALS: Target date: 06/09/24 Patient will be independent with is initial HEP.  Baseline: Goal status: INITIAL  2.  Patient will report being able to walk for at least 5 minutes for improved functional mobility. Baseline:  Goal status: INITIAL  3.  Patient will improve his gait speed to at least 0.60 m/s for improved functional mobility.  Baseline:  Goal status: INITIAL  LONG TERM GOALS: Target date: 06/30/24  Patient will be independent with his advanced HEP.  Baseline:  Goal status:  INITIAL  2.  Patient will improve his timed up and go time to 12 seconds or less to reduce his fall risk.  Baseline:  Goal status: INITIAL  3.  Patient will be able to transfer from sitting to standing without upper extremity support.  Baseline:  Goal status: INITIAL  4.  Patient will report being able to walk for least 10 minutes for improved function completing quick trips to the store. Baseline:  Goal status: INITIAL  5.  Patient will improve his gait speed to at least 0.8 m/s for improved household mobility. Baseline:  Goal status: INITIAL  PLAN:  PT FREQUENCY: 2x/week  PT DURATION: 6 weeks  PLANNED INTERVENTIONS: 02835- PT Re-evaluation, 97750- Physical Performance Testing, 97110-Therapeutic exercises, 97530- Therapeutic activity, 97112- Neuromuscular re-education, 97535- Self Care, 02859- Manual therapy, [phone]- Gait training, Patient/Family education, Balance training, and Stair training  PLAN FOR NEXT SESSION: NuStep, isometrics, gait training, and lower extremity strengthening with upper extremity interventions as an active rest break, continue balance training   Avir Deruiter, [place], PT 06/18/2024, 12:27 PM

## 2024-06-22 ENCOUNTER — Ambulatory Visit: Admitting: Oncology

## 2024-06-22 ENCOUNTER — Ambulatory Visit: Admitting: Hematology

## 2024-06-25 ENCOUNTER — Encounter: Payer: Self-pay | Admitting: Radiology

## 2024-06-27 NOTE — Progress Notes (Signed)
 Dan Dan [name], male    DOB: 11-Apr-1946    MRN: [id number]   Brief patient profile:  78 yowm active smoker yowm active smoker  referred to pulmonary clinic in [place]  01/13/2024 by Dr Rogers  for doe  x  2023 while on chemo po dytiga for prostate ca.   Cardiac eval by [facility] cards neg but copd on cxr 9/9/924      History of Present Illness  01/13/2024  Pulmonary/ 1st Dan [name] eval/ Dan Dan [name] / Dan Dan [name] trelegy x 3-4 y Chief Complaint  Patient presents with   Consult    Copd   Dyspnea:  also limited by R hip / 2 wheeled walker for hip surgery  Cough: still some rattle / worse in am> mucoid Sleep: flat bed/ one pillow  SABA use: occasionally hfa/ never neb  02:  prn  Rec Plan A = Automatic = Always=    Trelegy 100 one click 1st thing in am  Prednisone  5 mg x 2 each am until surgery  Plan B = Backup (to supplement plan A, not to replace it) Only use your albuterol inhaler as a rescue medication  Plan C = Crisis (instead of Plan B but only if Plan B stops working) - only use your albuterol nebulizer if you first try Plan B  Also  Ok to try albuterol  right before you leave home for  surgery and also  15 min before an activity For cough/ congestion > mucinex up to maximum of  1200 mg every 12 hours as needed  Depomedrol 120 mg IM You are cleared for surgery - you should not smoker prior to surgery.    05/17/2024  f/u ov/Dan Dan [name]/Dan Dan [name] re: doe  maint on trelegy 100 / prednisone  5 mg with chemo   Chief Complaint  Patient presents with   Follow-up    Sob is worse, walking short distances. Cough is productive (yellow)  Dyspnea:  using cane around flat surface  Cough: mostly in am slt yellow  Sleeping: flat bed one pillow s  resp cc  SABA use: tid hfa/ very rarely neb when I feel like it  02: none  Lung cancer screening: referred 05/17/2024  Rec Plan A = Automatic = Always=  Breztri   Take 2 puffs first thing in am and then another 2 puffs about 12 hours later.  Work on inhaler  technique:    Plan B = Backup (to supplement plan A, not to replace it) Only use your albuterol inhaler as a rescue medication Plan C = Crisis (instead of Plan B but only if Plan B stops working) - only use your albuterol nebulizer if you first try Plan B   Also  Ok to try albuterol 15 min before an activity (on alternating days)  that you know would usually make you short of breath   For cough > mucinex or mucinex dm up to 1200 every 12 hours as needed    Lab Results  Component Value Date   HGB 10.4 (L) 06/01/2024   HGB 10.5 (L) 05/17/2024   HGB 10.5 (L) 05/04/2024   HGB 10.8 (L) 03/02/2024    Fe sats/ ferritin ok    The key is to stop smoking completely before smoking completely stops you!   06/28/2024  f/u ov/Dan Dan [name]/Dan Dan [name] re: COPD?  maint on trelegy/prednisone  for gout/    still smoking  Chief Complaint  Patient presents with   COPD    6 wk f/u  Dyspnea:  more limited by  legs than breathing  Cough: slt smoker's rattle Sleeping: flat bed one pillow s  resp cc  SABA use: few times a day/ three times per week neb  02: none LCS  referred 05/17/24 and again 06/28/2024    No obvious day to day or daytime variability or assoc excess/ purulent sputum or mucus plugs or hemoptysis or cp or chest tightness, subjective wheeze or overt sinus or hb symptoms.    Also denies any obvious fluctuation of symptoms with weather or environmental changes or other aggravating or alleviating factors except as outlined above   No unusual exposure hx or h/o childhood pna/ asthma or knowledge of premature birth.  Current Allergies, Complete Past Medical History, Past Surgical History, Family History, and Social History were reviewed in [employer] record.  ROS  The following are not active complaints unless bolded Hoarseness, sore throat, dysphagia, dental problems, itching, sneezing,  nasal congestion or discharge of excess mucus or purulent secretions, ear ache,    fever, chills, sweats, unintended wt loss or wt gain, classically pleuritic or exertional cp,  orthopnea pnd or arm/hand swelling  or leg swelling, presyncope, palpitations, abdominal pain, anorexia, nausea, vomiting, diarrhea  or change in bowel habits or change in bladder habits, change in stools or change in urine, dysuria, hematuria,  rash, arthralgias, visual complaints, headache, numbness, weakness or ataxia or problems with walking or coordination,  change in mood or  memory.        Current Meds  Medication Sig   abiraterone  acetate (ZYTIGA ) 250 MG tablet Take 4 tablets (1,000 mg total) by mouth daily. Take on an empty stomach 1 hour before or 2 hours after a meal   allopurinol  (ZYLOPRIM ) 100 MG tablet Take 100 mg by mouth daily.   Ascorbic Acid (VITAMIN C) 1000 MG tablet Take 1,000 mg by mouth daily.   cholecalciferol (VITAMIN D3) 25 MCG (1000 UNIT) tablet Take 1,000 Units by mouth daily.   diltiazem  (CARDIZEM  CD) 360 MG 24 hr capsule Take 360 mg by mouth daily.   HYDROcodone -acetaminophen  (NORCO) 10-325 MG tablet Take 1 tablet by mouth every 6 (six) hours as needed.   losartan  (COZAAR ) 25 MG tablet TAKE 1 TABLET (25 MG TOTAL) BY MOUTH DAILY.   magnesium  oxide (MAG-OX) 400 (240 Mg) MG tablet TAKE 1 TABLET BY MOUTH EVERY DAY   megestrol  (MEGACE ) 40 MG/ML suspension Take 400 mg by mouth daily as needed (appetite).   methocarbamol  (ROBAXIN ) 500 MG tablet Take 1 tablet (500 mg total) by mouth every 6 (six) hours as needed for muscle spasms.   methylphenidate  (RITALIN ) 5 MG tablet Take 1 tablet (5 mg total) by mouth 2 (two) times daily as needed. Take 1 tab with breakfast and 1 tab with lunch as needed   potassium chloride  (MICRO-K ) 10 MEQ CR capsule TAKE 2 CAPSULES BY MOUTH DAILY.   predniSONE  (DELTASONE ) 5 MG tablet Take 1 tablet (5 mg total) by mouth daily with breakfast.   TRELEGY ELLIPTA 200-62.5-25 MCG/ACT AEPB Inhale 1 puff into the lungs daily.   Zinc 20 MG CAPS Take 40 mg by mouth  daily.   Current Facility-Administered Medications for the 06/28/24 encounter (Dan [name] Visit) with Dan Ozell NOVAK, MD  Medication   degarelix  (FIRMAGON ) injection 80 mg             Past Medical History:  Diagnosis Date   BPH (benign prostatic hyperplasia)    COPD (chronic obstructive pulmonary disease) (HCC)    Depression    ED (  erectile dysfunction)    Gout    Hyperlipidemia    Hypertension    Loss of hearing    Nocturia    Paroxysmal atrial fibrillation (HCC)    Prostate cancer (HCC)       Objective:   Wts  06/28/2024        129  05/17/2024        138   05/12/24 140 lb (63.5 kg)  05/04/24 139 lb 12.4 oz (63.4 kg)  03/22/24 144 lb (65.3 kg)    Vital signs reviewed  06/28/2024  - Note at rest 02 sats  99% on RA    General appearance:    wc bound somber wm nad/ smoker's rattle      HEENT :  Oropharynx  clear   Nasal turbinates l    NECK :  without JVD/Nodes/TM/ nl carotid upstrokes bilaterally   LUNGS: no acc muscle use,  Mod barrel  contour chest wall with bilateral  Distant exp rhonchi  and  without cough on insp or exp maneuvers and mod  Hyperresonant  to  percussion bilaterally     CV:  RRR  no s3 or murmur or increase in P2, and no edema   ABD:  soft and nontender with pos mid insp Hoover's  in the supine position. No bruits or organomegaly appreciated, bowel sounds nl  MS:   Ext warm without deformities or   obvious joint restrictions , calf tenderness, cyanosis or clubbing  SKIN: warm and dry without lesions    NEURO:  alert, approp, nl sensorium with  no motor or cerebellar deficits apparent.              Assessment   Assessment & Plan COPD GOLD ? / Group E Active smoker   - 01/13/2024  After extensive coaching inhaler device,  effectiveness =    75% with DPI  - 05/17/2024  After extensive coaching inhaler device,  effectiveness =    75% with hfa (short Ti)  - 05/17/2024   Walked on RA   x  1.5   lap(s) =  approx 225  ft  @ slow  pace, stopped  due to tired > sob with lowest 02 sats 97%  - PFT's ordered  06/28/2024     Group D (now reclassified as E) in terms of symptom/risk and laba/lama/ICS  therefore appropriate rx at this point >>>  trelegy and using approp saba so not change in meds (prednisone  base of 5 mg per day is for gout probably helping his breathing as well / advised    Each resp  medication was reviewed in detail including emphasizing most importantly the difference between maintenance and prns and under what circumstances the prns are to be triggered using an action plan format where appropriate.  Total time for H and P, chart review, counseling, reviewing dpi, hfa, neb  device(s) and generating customized AVS unique to this Dan [name] visit / same day charting = 25 min           Cigarette smoker Active smoker > eligible for LDS until age 85  - referred 05/17/24 > repeat 07/04/2024   4-5 min discussion re active cigarette smoking in addition to Dan [name] E&M  Ask about tobacco use:   ongoing Advise quitting   I took an extended  opportunity with this patient to outline the consequences of continued cigarette use  in airway disorders based on all the data we have from the multiple national lung health studies (  perfomed over decades at millions of dollars in cost)  indicating that smoking cessation, not choice of inhalers or pulmonary physicians, is the most important aspect of his care.   Assess willingness:  Not committed at this point Assist in quit attempt:  Per PCP when ready Arrange follow up:   Follow up per Primary Care planned          AVS  Patient Instructions  My Dan [name] will be contacting you by phone for referral to [facility]  for PFTs   - if you don't hear back from my Dan [name] within one week please call us  back or notify us  thru MyChart and we'll address it right away.   Please remember to go to the  x-ray department  @  [facility] for your tests - we will call you with the results when they are  available     The key is to stop smoking completely before smoking completely stops you!  Pulmonary follow up is as needed as most recent maint rx  (trelegy 100) was prescibed by PCP and pt doing in fine on it so refills per Dr Shayne Dan [name]  and we can see him at any point if requested  Late add:  change cxr to LDSCT referral (already done at last appt but not scheduled yet   Ozell America, MD 07/04/2024

## 2024-06-28 ENCOUNTER — Ambulatory Visit: Admitting: Internal Medicine

## 2024-06-28 ENCOUNTER — Encounter: Payer: Self-pay | Admitting: Internal Medicine

## 2024-06-28 VITALS — BP 131/68 | HR 84 | Ht 70.0 in | Wt 129.8 lb

## 2024-06-28 DIAGNOSIS — F17210 Nicotine dependence, cigarettes, uncomplicated: Secondary | ICD-10-CM | POA: Diagnosis not present

## 2024-06-28 DIAGNOSIS — J449 Chronic obstructive pulmonary disease, unspecified: Secondary | ICD-10-CM | POA: Diagnosis not present

## 2024-06-28 DIAGNOSIS — J42 Unspecified chronic bronchitis: Secondary | ICD-10-CM

## 2024-06-28 NOTE — Patient Instructions (Addendum)
 My office will be contacting you by phone for referral to [facility]  for PFTs   - if you don't hear back from my office within one week please call us  back or notify us  thru MyChart and we'll address it right away.   Please remember to go to the  x-ray department  @  [facility] for your tests - we will call you with the results when they are available     The key is to stop smoking completely before smoking completely stops you!  Pulmonary follow up is as needed   Late add:  change cxr to LDSCT referral (already done at last appt but not scheduled yet

## 2024-06-29 ENCOUNTER — Ambulatory Visit

## 2024-06-29 DIAGNOSIS — M6281 Muscle weakness (generalized): Secondary | ICD-10-CM

## 2024-06-29 NOTE — Therapy (Signed)
 OUTPATIENT PHYSICAL THERAPY LOWER EXTREMITY TREATMENT   Patient Name: Dan [name] MRN: [id number] DOB:1946/10/18, 78 y.o., male Today's Date: 06/29/2024  END OF SESSION:  PT End of Session - 06/29/24 1102     Visit Number 7    Number of Visits 12    Date for PT Re-Evaluation 07/30/24    PT Start Time 1100    PT Stop Time 1132    PT Time Calculation (min) 32 min    Activity Tolerance Patient limited by fatigue    Behavior During Therapy [facility] for tasks assessed/performed           Past Medical History:  Diagnosis Date   Anemia    Arthritis    Asthma    BPH (benign prostatic hyperplasia)    COPD (chronic obstructive pulmonary disease) (HCC)    Depression    ED (erectile dysfunction)    Gout    Hyperlipidemia    Hypertension    Loss of hearing    Nocturia    Paroxysmal atrial fibrillation (HCC)    Prostate cancer (HCC)    Past Surgical History:  Procedure Laterality Date   CARDIOVASCULAR STRESS TEST  12/31/2004   normal perfuction nuclear study w/ no evidence ischemia /  normal LV function and wall motion , ef 52%   PROSTATECTOMY  2019   TOTAL HIP ARTHROPLASTY Left    TOTAL HIP ARTHROPLASTY Right 01/20/2024   Procedure: ARTHROPLASTY, HIP, TOTAL, ANTERIOR APPROACH;  Surgeon: Ernie Cough, MD;  Location: [facility]S;  Service: Orthopedics;  Laterality: Right;   Patient Active Problem List   Diagnosis Date Noted   Osteonecrosis of hip (HCC) 05/12/2024   S/P total right hip arthroplasty 01/20/2024   Osteoporosis 03/26/2023   Genetic testing 03/13/2023   Malignant neoplasm of prostate metastatic to intra-abdominal lymph node (HCC) 12/31/2022   Joint pain 07/03/2021   Other specified abnormal immunological findings in serum 07/03/2021   Primary osteoarthritis 07/03/2021   Abnormal CT scan, colon 01/22/2021   History of Clostridioides difficile colitis 01/22/2021   Diarrhea 01/22/2021   Dilated pancreatic duct 01/22/2021   Loss of appetite 11/27/2020   GERD  (gastroesophageal reflux disease) 11/11/2020   Hematochezia 11/11/2020   History of prostate cancer 11/11/2020   Chest wall pain 10/31/2020   Anemia 08/02/2020   Other specified personal risk factors, not elsewhere classified 07/18/2020   Body mass index (BMI) 22.0-22.9, adult 04/20/2020   PMR (polymyalgia rheumatica) (HCC) 03/29/2020   Fatigue 03/28/2020   Chronic diastolic CHF (congestive heart failure) (HCC) 03/09/2020   Atrial fibrillation (HCC) 12/15/2019   Claudication (HCC) 12/15/2019   Palpitations 12/15/2019   Cystitis 09/20/2019   Cigarette smoker 07/02/2019   Malignant neoplasm of prostate metastatic to intrapelvic lymph node (HCC) 09/23/2018   Elevated PSA 05/27/2018   Hyperlipidemia    Hypertension    COPD GOLD ? / Group E    Depression    Gout    Alcohol  dependency (HCC)    ED (erectile dysfunction)    Loss of hearing     PCP: Toribio Jerel MATSU, MD  REFERRING PROVIDER: Toribio Jerel MATSU, MD  REFERRING DIAG: generalized weakness   THERAPY DIAG:  Muscle weakness (generalized)  Rationale for Evaluation and Treatment: Rehabilitation  ONSET DATE: 1 year ago  SUBJECTIVE:   SUBJECTIVE STATEMENT: Pt reports bil lower extremity soreness, but denies any pain.  PERTINENT HISTORY: Hypertension, atrial fibrillation, COPD, osteoarthritis, congestive heart failure, osteoporosis, depression, prostate cancer, current smoker, asthma, depression, and hard of hearing  PAIN:  Are you having pain? No  PRECAUTIONS: Fall  RED FLAGS: None   WEIGHT BEARING RESTRICTIONS: No  FALLS:  Has patient fallen in last 6 months? Yes. Number of falls 4  LIVING ENVIRONMENT: Lives with: lives with their spouse Lives in: House/apartment Stairs: Yes: External: 3 steps; can reach both; step to pattern Has following equipment at home: Single point cane  OCCUPATION: disabled   PLOF: Independent  PATIENT GOALS: improved mobility and lower extremity strength  NEXT MD VISIT:  06/08/24  OBJECTIVE:  Note: Objective measures were completed at Evaluation unless otherwise noted.  COGNITION: Overall cognitive status: Within functional limits for tasks assessed     SENSATION: Light touch: Impaired with diminished sensation in both feet.  Patient reports intermittent tingling in both feet, but none currently  EDEMA:  Moderate bilateral ankle edema observed  POSTURE: rounded shoulders, forward head, and flexed trunk   LOWER EXTREMITY ROM:   LOWER EXTREMITY MMT:  MMT Right eval Left eval  Hip flexion 3+/5 3/5  Hip extension    Hip abduction    Hip adduction    Hip internal rotation    Hip external rotation    Knee flexion 4+/5 4/5  Knee extension 4-/5 4-/5  Ankle dorsiflexion 3+/5 3+/5  Ankle plantarflexion    Ankle inversion    Ankle eversion     (Blank rows = not tested)  FUNCTIONAL TESTS:  Timed up and go (TUG): 23.37 seconds with SPC  Sit to stand: requires UE support from armrests and fatigued after 1 repetition  GAIT: Assistive device utilized: Single point cane Level of assistance: Modified independence Comments: Decreased stride length and foot clearance bilaterally   Gait speed: 0.47 m/s with single point cane                                                                                                                               TREATMENT DATE:    06/29/24   EXERCISE LOG  Exercise Repetitions and Resistance Comments  Nustep Lvl 2  x 15 mins    Seated marching 2# to fatigue   Seated LAQ 2# to fatigue   Hip add isometrics    Hip abd isometrics    Seated hamstring curls    Heel/Toe Raises    Goal Assessment See Below        Blank cell = exercise not performed today  06/18/24:  Nustep level 1 x 2 x 15 minutes with frequent rest breaks.   06/11/2024                                    EXERCISE LOG  Exercise Repetitions and Resistance Comments  Nustep  X10 mins   Seated marching 1x45s 3#   LAQ 2x30s 3#   Resisted  dorsiflexion 2x12 w/red tb   Hip add iso 2x60s    Hip  abd iso 2x60s   Heel raises  1x20    Blank cell = exercise not performed today                                     PATIENT EDUCATION:  Education details: POC, prognosis, objective findings, safety, fall risk, and goals for physical therapy Person educated: Patient Education method: Explanation Education comprehension: verbalized understanding  HOME EXERCISE PROGRAM:   ASSESSMENT:  CLINICAL IMPRESSION: Pt arrives for today's treatment session reporting minimal BLE soreness.  Pt able to perform Nustep today with fewer rest breaks required.  Pt able to perform TUG in 17.3 seconds making good progress towards his LTG.  Pt states that his able to ambulate approximately 3 mins before needing to take a rest break.  Pt denies performing his HEP regularly.  Pt able to demonstrate .64 m/s gait speed, meeting his STG and making progress towards his LTG.  Pt reported increased BLE soreness at completion of today's treatment session.   OBJECTIVE IMPAIRMENTS: Abnormal gait, decreased activity tolerance, decreased balance, decreased mobility, difficulty walking, decreased strength, impaired sensation, and pain.   ACTIVITY LIMITATIONS: standing, stairs, transfers, and locomotion level  PARTICIPATION LIMITATIONS: meal prep, cleaning, laundry, community activity, and yard work  PERSONAL FACTORS: Fitness, Past/current experiences, Time since onset of injury/illness/exacerbation, Transportation, and 3+ comorbidities: Hypertension, atrial fibrillation, COPD, osteoarthritis, congestive heart failure, osteoporosis, depression, prostate cancer, current smoker, asthma, depression, and hard of hearing are also affecting patient's functional outcome.   REHAB POTENTIAL: Good  CLINICAL DECISION MAKING: Evolving/moderate complexity  EVALUATION COMPLEXITY: Moderate   GOALS: Goals reviewed with patient? Yes  SHORT TERM GOALS: Target date: 06/09/24 Patient  will be independent with is initial HEP.  Baseline: Goal status: IN PROGRESS  2.  Patient will report being able to walk for at least 5 minutes for improved functional mobility. Baseline: 8/26: approximately 3 mins Goal status: IN PROGRESS  3.  Patient will improve his gait speed to at least 0.60 m/s for improved functional mobility.  Baseline: 8/26: .64 m/s Goal status: MET  LONG TERM GOALS: Target date: 06/30/24  Patient will be independent with his advanced HEP.  Baseline:  Goal status: IN PROGRESS  2.  Patient will improve his timed up and go time to 12 seconds or less to reduce his fall risk.  Baseline:  8/26: 17.3 seconds Goal status: IN PROGRESS  3.  Patient will be able to transfer from sitting to standing without upper extremity support.  Baseline: 8/26: unable to  Goal status: IN PROGRESS  4.  Patient will report being able to walk for least 10 minutes for improved function completing quick trips to the store. Baseline: 8/26: approximately 3 mins Goal status: IN PROGRESS  5.  Patient will improve his gait speed to at least 0.8 m/s for improved household mobility. Baseline: 8/26: .64 m/s Goal status: IN PROGRESS  PLAN:  PT FREQUENCY: 2x/week  PT DURATION: 6 weeks  PLANNED INTERVENTIONS: 97164- PT Re-evaluation, 97750- Physical Performance Testing, 97110-Therapeutic exercises, 97530- Therapeutic activity, 97112- Neuromuscular re-education, 97535- Self Care, [id number]- Manual therapy, [phone]- Gait training, Patient/Family education, Balance training, and Stair training  PLAN FOR NEXT SESSION: NuStep, isometrics, gait training, and lower extremity strengthening with upper extremity interventions as an active rest break, continue balance training   Delon DELENA Gosling, PTA 06/29/2024, 11:35 AM

## 2024-07-01 ENCOUNTER — Other Ambulatory Visit: Payer: Self-pay

## 2024-07-02 ENCOUNTER — Ambulatory Visit

## 2024-07-02 DIAGNOSIS — M6281 Muscle weakness (generalized): Secondary | ICD-10-CM

## 2024-07-02 NOTE — Therapy (Signed)
 OUTPATIENT PHYSICAL THERAPY LOWER EXTREMITY TREATMENT   Patient Name: Dan [name] MRN: [id number] DOB:29-Nov-1945, 78 y.o., male Today's Date: 07/02/2024  END OF SESSION:  PT End of Session - 07/02/24 1105     Visit Number 8    Number of Visits 12    Date for PT Re-Evaluation 07/30/24    PT Start Time 1100    PT Stop Time 1127    PT Time Calculation (min) 27 min    Activity Tolerance Patient limited by fatigue    Behavior During Therapy [facility] for tasks assessed/performed           Past Medical History:  Diagnosis Date   Anemia    Arthritis    Asthma    BPH (benign prostatic hyperplasia)    COPD (chronic obstructive pulmonary disease) (HCC)    Depression    ED (erectile dysfunction)    Gout    Hyperlipidemia    Hypertension    Loss of hearing    Nocturia    Paroxysmal atrial fibrillation (HCC)    Prostate cancer (HCC)    Past Surgical History:  Procedure Laterality Date   CARDIOVASCULAR STRESS TEST  12/31/2004   normal perfuction nuclear study w/ no evidence ischemia /  normal LV function and wall motion , ef 52%   PROSTATECTOMY  2019   TOTAL HIP ARTHROPLASTY Left    TOTAL HIP ARTHROPLASTY Right 01/20/2024   Procedure: ARTHROPLASTY, HIP, TOTAL, ANTERIOR APPROACH;  Surgeon: Ernie Cough, MD;  Location: [facility];  Service: Orthopedics;  Laterality: Right;   Patient Active Problem List   Diagnosis Date Noted   Osteonecrosis of hip (HCC) 05/12/2024   S/P total right hip arthroplasty 01/20/2024   Osteoporosis 03/26/2023   Genetic testing 03/13/2023   Malignant neoplasm of prostate metastatic to intra-abdominal lymph node (HCC) 12/31/2022   Joint pain 07/03/2021   Other specified abnormal immunological findings in serum 07/03/2021   Primary osteoarthritis 07/03/2021   Abnormal CT scan, colon 01/22/2021   History of Clostridioides difficile colitis 01/22/2021   Diarrhea 01/22/2021   Dilated pancreatic duct 01/22/2021   Loss of appetite 11/27/2020   GERD  (gastroesophageal reflux disease) 11/11/2020   Hematochezia 11/11/2020   History of prostate cancer 11/11/2020   Chest wall pain 10/31/2020   Anemia 08/02/2020   Other specified personal risk factors, not elsewhere classified 07/18/2020   Body mass index (BMI) 22.0-22.9, adult 04/20/2020   PMR (polymyalgia rheumatica) (HCC) 03/29/2020   Fatigue 03/28/2020   Chronic diastolic CHF (congestive heart failure) (HCC) 03/09/2020   Atrial fibrillation (HCC) 12/15/2019   Claudication (HCC) 12/15/2019   Palpitations 12/15/2019   Cystitis 09/20/2019   Cigarette smoker 07/02/2019   Malignant neoplasm of prostate metastatic to intrapelvic lymph node (HCC) 09/23/2018   Elevated PSA 05/27/2018   Hyperlipidemia    Hypertension    COPD GOLD ? / Group E    Depression    Gout    Alcohol  dependency (HCC)    ED (erectile dysfunction)    Loss of hearing     PCP: Toribio Jerel MATSU, MD  REFERRING PROVIDER: Toribio Jerel MATSU, MD  REFERRING DIAG: generalized weakness   THERAPY DIAG:  Muscle weakness (generalized)  Rationale for Evaluation and Treatment: Rehabilitation  ONSET DATE: 1 year ago  SUBJECTIVE:   SUBJECTIVE STATEMENT: Pt reports all over pain and soreness today.   PERTINENT HISTORY: Hypertension, atrial fibrillation, COPD, osteoarthritis, congestive heart failure, osteoporosis, depression, prostate cancer, current smoker, asthma, depression, and hard of hearing PAIN:  Are you having pain? Yes: NPRS scale: no number given/10 Pain location: all over  PRECAUTIONS: Fall  RED FLAGS: None   WEIGHT BEARING RESTRICTIONS: No  FALLS:  Has patient fallen in last 6 months? Yes. Number of falls 4  LIVING ENVIRONMENT: Lives with: lives with their spouse Lives in: House/apartment Stairs: Yes: External: 3 steps; can reach both; step to pattern Has following equipment at home: Single point cane  OCCUPATION: disabled   PLOF: Independent  PATIENT GOALS: improved mobility and lower  extremity strength  NEXT MD VISIT: 06/08/24  OBJECTIVE:  Note: Objective measures were completed at Evaluation unless otherwise noted.  COGNITION: Overall cognitive status: Within functional limits for tasks assessed     SENSATION: Light touch: Impaired with diminished sensation in both feet.  Patient reports intermittent tingling in both feet, but none currently  EDEMA:  Moderate bilateral ankle edema observed  POSTURE: rounded shoulders, forward head, and flexed trunk   LOWER EXTREMITY ROM:   LOWER EXTREMITY MMT:  MMT Right eval Left eval  Hip flexion 3+/5 3/5  Hip extension    Hip abduction    Hip adduction    Hip internal rotation    Hip external rotation    Knee flexion 4+/5 4/5  Knee extension 4-/5 4-/5  Ankle dorsiflexion 3+/5 3+/5  Ankle plantarflexion    Ankle inversion    Ankle eversion     (Blank rows = not tested)  FUNCTIONAL TESTS:  Timed up and go (TUG): 23.37 seconds with SPC  Sit to stand: requires UE support from armrests and fatigued after 1 repetition  GAIT: Assistive device utilized: Single point cane Level of assistance: Modified independence Comments: Decreased stride length and foot clearance bilaterally   Gait speed: 0.47 m/s with single point cane                                                                                                                               TREATMENT DATE:    07/02/24   EXERCISE LOG  Exercise Repetitions and Resistance Comments  Nustep Lvl 3  x 15 mins    Seated marching 2# to fatigue   Seated LAQ 2# to fatigue   Hip add isometrics    Hip abd isometrics    Seated hamstring curls    Heel/Toe Raises    Goal Assessment         Blank cell = exercise not performed today  06/18/24:  Nustep level 1 x 2 x 15 minutes with frequent rest breaks.   06/11/2024                                    EXERCISE LOG  Exercise Repetitions and Resistance Comments  Nustep  X10 mins   Seated marching 1x45s 3#   LAQ  2x30s 3#   Resisted dorsiflexion 2x12 w/red tb   Hip add  iso 2x60s    Hip abd iso 2x60s   Heel raises  1x20    Blank cell = exercise not performed today                                     PATIENT EDUCATION:  Education details: POC, prognosis, objective findings, safety, fall risk, and goals for physical therapy Person educated: Patient Education method: Explanation Education comprehension: verbalized understanding  HOME EXERCISE PROGRAM:   ASSESSMENT:  CLINICAL IMPRESSION: Pt arrives for today's treatment session reporting all over pain and soreness.  No number given.  Pt able to tolerate increased resistance on the Nustep today with fatigue noted.  Pt requested treatment session to end early due to increased fatigue and bil knee soreness/pain.   OBJECTIVE IMPAIRMENTS: Abnormal gait, decreased activity tolerance, decreased balance, decreased mobility, difficulty walking, decreased strength, impaired sensation, and pain.   ACTIVITY LIMITATIONS: standing, stairs, transfers, and locomotion level  PARTICIPATION LIMITATIONS: meal prep, cleaning, laundry, community activity, and yard work  PERSONAL FACTORS: Fitness, Past/current experiences, Time since onset of injury/illness/exacerbation, Transportation, and 3+ comorbidities: Hypertension, atrial fibrillation, COPD, osteoarthritis, congestive heart failure, osteoporosis, depression, prostate cancer, current smoker, asthma, depression, and hard of hearing are also affecting patient's functional outcome.   REHAB POTENTIAL: Good  CLINICAL DECISION MAKING: Evolving/moderate complexity  EVALUATION COMPLEXITY: Moderate   GOALS: Goals reviewed with patient? Yes  SHORT TERM GOALS: Target date: 06/09/24 Patient will be independent with is initial HEP.  Baseline: Goal status: IN PROGRESS  2.  Patient will report being able to walk for at least 5 minutes for improved functional mobility. Baseline: 8/26: approximately 3 mins Goal  status: IN PROGRESS  3.  Patient will improve his gait speed to at least 0.60 m/s for improved functional mobility.  Baseline: 8/26: .64 m/s Goal status: MET  LONG TERM GOALS: Target date: 06/30/24  Patient will be independent with his advanced HEP.  Baseline:  Goal status: IN PROGRESS  2.  Patient will improve his timed up and go time to 12 seconds or less to reduce his fall risk.  Baseline:  8/26: 17.3 seconds Goal status: IN PROGRESS  3.  Patient will be able to transfer from sitting to standing without upper extremity support.  Baseline: 8/26: unable to  Goal status: IN PROGRESS  4.  Patient will report being able to walk for least 10 minutes for improved function completing quick trips to the store. Baseline: 8/26: approximately 3 mins Goal status: IN PROGRESS  5.  Patient will improve his gait speed to at least 0.8 m/s for improved household mobility. Baseline: 8/26: .64 m/s Goal status: IN PROGRESS  PLAN:  PT FREQUENCY: 2x/week  PT DURATION: 6 weeks  PLANNED INTERVENTIONS: 97164- PT Re-evaluation, 97750- Physical Performance Testing, 97110-Therapeutic exercises, 97530- Therapeutic activity, 97112- Neuromuscular re-education, 97535- Self Care, [id number]- Manual therapy, [phone]- Gait training, Patient/Family education, Balance training, and Stair training  PLAN FOR NEXT SESSION: NuStep, isometrics, gait training, and lower extremity strengthening with upper extremity interventions as an active rest break, continue balance training   Delon DELENA Gosling, PTA 07/02/2024, 11:28 AM

## 2024-07-04 ENCOUNTER — Encounter: Payer: Self-pay | Admitting: Internal Medicine

## 2024-07-04 NOTE — Assessment & Plan Note (Addendum)
 Active smoker   - 01/13/2024  After extensive coaching inhaler device,  effectiveness =    75% with DPI  - 05/17/2024  After extensive coaching inhaler device,  effectiveness =    75% with hfa (short Ti)  - 05/17/2024   Walked on RA   x  1.5   lap(s) =  approx 225  ft  @ slow  pace, stopped due to tired > sob with lowest 02 sats 97%  - PFT's ordered  06/28/2024     Group D (now reclassified as E) in terms of symptom/risk and laba/lama/ICS  therefore appropriate rx at this point >>>  trelegy and using approp saba so not change in meds (prednisone  base of 5 mg per day is for gout probably helping his breathing as well / advised    Each resp  medication was reviewed in detail including emphasizing most importantly the difference between maintenance and prns and under what circumstances the prns are to be triggered using an action plan format where appropriate.  Total time for H and P, chart review, counseling, reviewing dpi, hfa, neb  device(s) and generating customized AVS unique to this office visit / same day charting = 25 min

## 2024-07-04 NOTE — Assessment & Plan Note (Addendum)
 Active smoker > eligible for LDS until age 77  - referred 05/17/24 > repeat 07/04/2024   4-5 min discussion re active cigarette smoking in addition to office E&M  Ask about tobacco use:   ongoing Advise quitting   I took an extended  opportunity with this patient to outline the consequences of continued cigarette use  in airway disorders based on all the data we have from the multiple national lung health studies (perfomed over decades at millions of dollars in cost)  indicating that smoking cessation, not choice of inhalers or pulmonary physicians, is the most important aspect of his care.   Assess willingness:  Not committed at this point Assist in quit attempt:  Per PCP when ready Arrange follow up:   Follow up per Primary Care planned

## 2024-07-08 ENCOUNTER — Other Ambulatory Visit: Payer: Self-pay | Admitting: Pharmacy Technician

## 2024-07-08 ENCOUNTER — Other Ambulatory Visit: Payer: Self-pay

## 2024-07-08 NOTE — Progress Notes (Signed)
 Specialty Pharmacy Refill Coordination Note  Dan [name] is a 78 y.o. male contacted today regarding refills of specialty medication(s) Abiraterone  Acetate (ZYTIGA )   Patient requested Delivery   Delivery date: 07/09/24   Verified address: [address]  [place] [address]   Medication will be filled on 07/08/24.  Spoke to patient's spouse.

## 2024-07-09 ENCOUNTER — Ambulatory Visit: Attending: Family Medicine

## 2024-07-09 DIAGNOSIS — M6281 Muscle weakness (generalized): Secondary | ICD-10-CM | POA: Insufficient documentation

## 2024-07-09 NOTE — Therapy (Signed)
 OUTPATIENT PHYSICAL THERAPY LOWER EXTREMITY TREATMENT   Patient Name: Dan [name] MRN: [id number] DOB:1945-11-23, 78 y.o., male Today's Date: 07/09/2024  END OF SESSION:  PT End of Session - 07/09/24 1152     Visit Number 9    Number of Visits 12    Date for PT Re-Evaluation 07/30/24    PT Start Time 1151    PT Stop Time 1214    PT Time Calculation (min) 23 min    Activity Tolerance Patient limited by fatigue    Behavior During Therapy [facility] for tasks assessed/performed           Past Medical History:  Diagnosis Date   Anemia    Arthritis    Asthma    BPH (benign prostatic hyperplasia)    COPD (chronic obstructive pulmonary disease) (HCC)    Depression    ED (erectile dysfunction)    Gout    Hyperlipidemia    Hypertension    Loss of hearing    Nocturia    Paroxysmal atrial fibrillation (HCC)    Prostate cancer (HCC)    Past Surgical History:  Procedure Laterality Date   CARDIOVASCULAR STRESS TEST  12/31/2004   normal perfuction nuclear study w/ no evidence ischemia /  normal LV function and wall motion , ef 52%   PROSTATECTOMY  2019   TOTAL HIP ARTHROPLASTY Left    TOTAL HIP ARTHROPLASTY Right 01/20/2024   Procedure: ARTHROPLASTY, HIP, TOTAL, ANTERIOR APPROACH;  Surgeon: Ernie Cough, MD;  Location: [facility];  Service: Orthopedics;  Laterality: Right;   Patient Active Problem List   Diagnosis Date Noted   Osteonecrosis of hip (HCC) 05/12/2024   S/P total right hip arthroplasty 01/20/2024   Osteoporosis 03/26/2023   Genetic testing 03/13/2023   Malignant neoplasm of prostate metastatic to intra-abdominal lymph node (HCC) 12/31/2022   Joint pain 07/03/2021   Other specified abnormal immunological findings in serum 07/03/2021   Primary osteoarthritis 07/03/2021   Abnormal CT scan, colon 01/22/2021   History of Clostridioides difficile colitis 01/22/2021   Diarrhea 01/22/2021   Dilated pancreatic duct 01/22/2021   Loss of appetite 11/27/2020   GERD  (gastroesophageal reflux disease) 11/11/2020   Hematochezia 11/11/2020   History of prostate cancer 11/11/2020   Chest wall pain 10/31/2020   Anemia 08/02/2020   Other specified personal risk factors, not elsewhere classified 07/18/2020   Body mass index (BMI) 22.0-22.9, adult 04/20/2020   PMR (polymyalgia rheumatica) (HCC) 03/29/2020   Fatigue 03/28/2020   Chronic diastolic CHF (congestive heart failure) (HCC) 03/09/2020   Atrial fibrillation (HCC) 12/15/2019   Claudication (HCC) 12/15/2019   Palpitations 12/15/2019   Cystitis 09/20/2019   Cigarette smoker 07/02/2019   Malignant neoplasm of prostate metastatic to intrapelvic lymph node (HCC) 09/23/2018   Elevated PSA 05/27/2018   Hyperlipidemia    Hypertension    COPD GOLD ? / Group E    Depression    Gout    Alcohol  dependency (HCC)    ED (erectile dysfunction)    Loss of hearing     PCP: Toribio Jerel MATSU, MD  REFERRING PROVIDER: Toribio Jerel MATSU, MD  REFERRING DIAG: generalized weakness   THERAPY DIAG:  Muscle weakness (generalized)  Rationale for Evaluation and Treatment: Rehabilitation  ONSET DATE: 1 year ago  SUBJECTIVE:   SUBJECTIVE STATEMENT: Pt denies any pain today, but states that he is just having a a day, without further explanation.   PERTINENT HISTORY: Hypertension, atrial fibrillation, COPD, osteoarthritis, congestive heart failure, osteoporosis, depression, prostate  cancer, current smoker, asthma, depression, and hard of hearing PAIN:  Are you having pain? No  PRECAUTIONS: Fall  RED FLAGS: None   WEIGHT BEARING RESTRICTIONS: No  FALLS:  Has patient fallen in last 6 months? Yes. Number of falls 4  LIVING ENVIRONMENT: Lives with: lives with their spouse Lives in: House/apartment Stairs: Yes: External: 3 steps; can reach both; step to pattern Has following equipment at home: Single point cane  OCCUPATION: disabled   PLOF: Independent  PATIENT GOALS: improved mobility and lower  extremity strength  NEXT MD VISIT: 06/08/24  OBJECTIVE:  Note: Objective measures were completed at Evaluation unless otherwise noted.  COGNITION: Overall cognitive status: Within functional limits for tasks assessed     SENSATION: Light touch: Impaired with diminished sensation in both feet.  Patient reports intermittent tingling in both feet, but none currently  EDEMA:  Moderate bilateral ankle edema observed  POSTURE: rounded shoulders, forward head, and flexed trunk   LOWER EXTREMITY ROM:   LOWER EXTREMITY MMT:  MMT Right eval Left eval  Hip flexion 3+/5 3/5  Hip extension    Hip abduction    Hip adduction    Hip internal rotation    Hip external rotation    Knee flexion 4+/5 4/5  Knee extension 4-/5 4-/5  Ankle dorsiflexion 3+/5 3+/5  Ankle plantarflexion    Ankle inversion    Ankle eversion     (Blank rows = not tested)  FUNCTIONAL TESTS:  Timed up and go (TUG): 23.37 seconds with SPC  Sit to stand: requires UE support from armrests and fatigued after 1 repetition  GAIT: Assistive device utilized: Single point cane Level of assistance: Modified independence Comments: Decreased stride length and foot clearance bilaterally   Gait speed: 0.47 m/s with single point cane                                                                                                                               TREATMENT DATE:    07/09/24   EXERCISE LOG  Exercise Repetitions and Resistance Comments  Nustep Lvl 3  x 15 mins    Seated marching 2# to fatigue   Seated LAQ 2# to fatigue   Hip add isometrics    Hip abd isometrics    Seated hamstring curls    Heel/Toe Raises    Goal Assessment         Blank cell = exercise not performed today  06/18/24:  Nustep level 1 x 2 x 15 minutes with frequent rest breaks.   06/11/2024                                    EXERCISE LOG  Exercise Repetitions and Resistance Comments  Nustep  X10 mins   Seated marching 1x45s 3#   LAQ  2x30s 3#   Resisted dorsiflexion 2x12 w/red tb  Hip add iso 2x60s    Hip abd iso 2x60s   Heel raises  1x20    Blank cell = exercise not performed today                                     PATIENT EDUCATION:  Education details: POC, prognosis, objective findings, safety, fall risk, and goals for physical therapy Person educated: Patient Education method: Explanation Education comprehension: verbalized understanding  HOME EXERCISE PROGRAM:   ASSESSMENT:  CLINICAL IMPRESSION: Pt arrives for today's treatment session denykng any pain, but reports I'm just having a day, without further explanation.  Pt continues to be limited by lack of endurance and activity tolerance.  Pt requesting session to end early due to increased fatigue and being worn out.  Pt denies any pain at completion of today's treatment session.   OBJECTIVE IMPAIRMENTS: Abnormal gait, decreased activity tolerance, decreased balance, decreased mobility, difficulty walking, decreased strength, impaired sensation, and pain.   ACTIVITY LIMITATIONS: standing, stairs, transfers, and locomotion level  PARTICIPATION LIMITATIONS: meal prep, cleaning, laundry, community activity, and yard work  PERSONAL FACTORS: Fitness, Past/current experiences, Time since onset of injury/illness/exacerbation, Transportation, and 3+ comorbidities: Hypertension, atrial fibrillation, COPD, osteoarthritis, congestive heart failure, osteoporosis, depression, prostate cancer, current smoker, asthma, depression, and hard of hearing are also affecting patient's functional outcome.   REHAB POTENTIAL: Good  CLINICAL DECISION MAKING: Evolving/moderate complexity  EVALUATION COMPLEXITY: Moderate   GOALS: Goals reviewed with patient? Yes  SHORT TERM GOALS: Target date: 06/09/24 Patient will be independent with is initial HEP.  Baseline: Goal status: IN PROGRESS  2.  Patient will report being able to walk for at least 5 minutes for improved  functional mobility. Baseline: 8/26: approximately 3 mins Goal status: IN PROGRESS  3.  Patient will improve his gait speed to at least 0.60 m/s for improved functional mobility.  Baseline: 8/26: .64 m/s Goal status: MET  LONG TERM GOALS: Target date: 06/30/24  Patient will be independent with his advanced HEP.  Baseline:  Goal status: IN PROGRESS  2.  Patient will improve his timed up and go time to 12 seconds or less to reduce his fall risk.  Baseline:  8/26: 17.3 seconds Goal status: IN PROGRESS  3.  Patient will be able to transfer from sitting to standing without upper extremity support.  Baseline: 8/26: unable to  Goal status: IN PROGRESS  4.  Patient will report being able to walk for least 10 minutes for improved function completing quick trips to the store. Baseline: 8/26: approximately 3 mins Goal status: IN PROGRESS  5.  Patient will improve his gait speed to at least 0.8 m/s for improved household mobility. Baseline: 8/26: .64 m/s Goal status: IN PROGRESS  PLAN:  PT FREQUENCY: 2x/week  PT DURATION: 6 weeks  PLANNED INTERVENTIONS: 97164- PT Re-evaluation, 97750- Physical Performance Testing, 97110-Therapeutic exercises, 97530- Therapeutic activity, 97112- Neuromuscular re-education, 97535- Self Care, 02859- Manual therapy, [phone]- Gait training, Patient/Family education, Balance training, and Stair training  PLAN FOR NEXT SESSION: NuStep, isometrics, gait training, and lower extremity strengthening with upper extremity interventions as an active rest break, continue balance training   Delon DELENA Gosling, PTA 07/09/2024, 12:16 PM

## 2024-07-14 ENCOUNTER — Ambulatory Visit

## 2024-07-15 ENCOUNTER — Ambulatory Visit

## 2024-07-15 DIAGNOSIS — M6281 Muscle weakness (generalized): Secondary | ICD-10-CM

## 2024-07-15 NOTE — Therapy (Signed)
 OUTPATIENT PHYSICAL THERAPY LOWER EXTREMITY TREATMENT   Patient Name: Dan [name] MRN: [id number] DOB:1946-08-21, 78 y.o., male Today's Date: 07/15/2024  END OF SESSION:  PT End of Session - 07/15/24 1303     Visit Number 10    Number of Visits 12    Date for PT Re-Evaluation 07/30/24    PT Start Time 1300    PT Stop Time 1330    PT Time Calculation (min) 30 min    Activity Tolerance Patient limited by fatigue    Behavior During Therapy [facility] for tasks assessed/performed           Past Medical History:  Diagnosis Date   Anemia    Arthritis    Asthma    BPH (benign prostatic hyperplasia)    COPD (chronic obstructive pulmonary disease) (HCC)    Depression    ED (erectile dysfunction)    Gout    Hyperlipidemia    Hypertension    Loss of hearing    Nocturia    Paroxysmal atrial fibrillation (HCC)    Prostate cancer (HCC)    Past Surgical History:  Procedure Laterality Date   CARDIOVASCULAR STRESS TEST  12/31/2004   normal perfuction nuclear study w/ no evidence ischemia /  normal LV function and wall motion , ef 52%   PROSTATECTOMY  2019   TOTAL HIP ARTHROPLASTY Left    TOTAL HIP ARTHROPLASTY Right 01/20/2024   Procedure: ARTHROPLASTY, HIP, TOTAL, ANTERIOR APPROACH;  Surgeon: Ernie Cough, MD;  Location: [facility];  Service: Orthopedics;  Laterality: Right;   Patient Active Problem List   Diagnosis Date Noted   Osteonecrosis of hip (HCC) 05/12/2024   S/P total right hip arthroplasty 01/20/2024   Osteoporosis 03/26/2023   Genetic testing 03/13/2023   Malignant neoplasm of prostate metastatic to intra-abdominal lymph node (HCC) 12/31/2022   Joint pain 07/03/2021   Other specified abnormal immunological findings in serum 07/03/2021   Primary osteoarthritis 07/03/2021   Abnormal CT scan, colon 01/22/2021   History of Clostridioides difficile colitis 01/22/2021   Diarrhea 01/22/2021   Dilated pancreatic duct 01/22/2021   Loss of appetite 11/27/2020   GERD  (gastroesophageal reflux disease) 11/11/2020   Hematochezia 11/11/2020   History of prostate cancer 11/11/2020   Chest wall pain 10/31/2020   Anemia 08/02/2020   Other specified personal risk factors, not elsewhere classified 07/18/2020   Body mass index (BMI) 22.0-22.9, adult 04/20/2020   PMR (polymyalgia rheumatica) (HCC) 03/29/2020   Fatigue 03/28/2020   Chronic diastolic CHF (congestive heart failure) (HCC) 03/09/2020   Atrial fibrillation (HCC) 12/15/2019   Claudication (HCC) 12/15/2019   Palpitations 12/15/2019   Cystitis 09/20/2019   Cigarette smoker 07/02/2019   Malignant neoplasm of prostate metastatic to intrapelvic lymph node (HCC) 09/23/2018   Elevated PSA 05/27/2018   Hyperlipidemia    Hypertension    COPD GOLD ? / Group E    Depression    Gout    Alcohol  dependency (HCC)    ED (erectile dysfunction)    Loss of hearing     PCP: Toribio Jerel MATSU, MD  REFERRING PROVIDER: Toribio Jerel MATSU, MD  REFERRING DIAG: generalized weakness   THERAPY DIAG:  Muscle weakness (generalized)  Rationale for Evaluation and Treatment: Rehabilitation  ONSET DATE: 1 year ago  SUBJECTIVE:   SUBJECTIVE STATEMENT: Pt denies any pain today, but states that he is just having a a day, without further explanation.   PERTINENT HISTORY: Hypertension, atrial fibrillation, COPD, osteoarthritis, congestive heart failure, osteoporosis, depression, prostate  cancer, current smoker, asthma, depression, and hard of hearing PAIN:  Are you having pain? No  PRECAUTIONS: Fall  RED FLAGS: None   WEIGHT BEARING RESTRICTIONS: No  FALLS:  Has patient fallen in last 6 months? Yes. Number of falls 4  LIVING ENVIRONMENT: Lives with: lives with their spouse Lives in: House/apartment Stairs: Yes: External: 3 steps; can reach both; step to pattern Has following equipment at home: Single point cane  OCCUPATION: disabled   PLOF: Independent  PATIENT GOALS: improved mobility and lower  extremity strength  NEXT MD VISIT: 06/08/24  OBJECTIVE:  Note: Objective measures were completed at Evaluation unless otherwise noted.  COGNITION: Overall cognitive status: Within functional limits for tasks assessed     SENSATION: Light touch: Impaired with diminished sensation in both feet.  Patient reports intermittent tingling in both feet, but none currently  EDEMA:  Moderate bilateral ankle edema observed  POSTURE: rounded shoulders, forward head, and flexed trunk   LOWER EXTREMITY ROM:   LOWER EXTREMITY MMT:  MMT Right eval Left eval  Hip flexion 3+/5 3/5  Hip extension    Hip abduction    Hip adduction    Hip internal rotation    Hip external rotation    Knee flexion 4+/5 4/5  Knee extension 4-/5 4-/5  Ankle dorsiflexion 3+/5 3+/5  Ankle plantarflexion    Ankle inversion    Ankle eversion     (Blank rows = not tested)  FUNCTIONAL TESTS:  Timed up and go (TUG): 23.37 seconds with SPC  Sit to stand: requires UE support from armrests and fatigued after 1 repetition  GAIT: Assistive device utilized: Single point cane Level of assistance: Modified independence Comments: Decreased stride length and foot clearance bilaterally   Gait speed: 0.47 m/s with single point cane                                                                                                                               TREATMENT DATE:    07/15/24   EXERCISE LOG  Exercise Repetitions and Resistance Comments  Nustep Lvl 3  x 15 mins    Seated marching 2# to fatigue   Seated LAQ 2# to fatigue   Hip add isometrics    Hip abd isometrics    Seated hamstring curls    Heel/Toe Raises    Goal Assessment See Below        Blank cell = exercise not performed today  06/18/24:  Nustep level 1 x 2 x 15 minutes with frequent rest breaks.   06/11/2024                                    EXERCISE LOG  Exercise Repetitions and Resistance Comments  Nustep  X10 mins   Seated marching 1x45s  3#   LAQ 2x30s 3#   Resisted dorsiflexion 2x12 w/red tb  Hip add iso 2x60s    Hip abd iso 2x60s   Heel raises  1x20    Blank cell = exercise not performed today                                     PATIENT EDUCATION:  Education details: POC, prognosis, objective findings, safety, fall risk, and goals for physical therapy Person educated: Patient Education method: Explanation Education comprehension: verbalized understanding  HOME EXERCISE PROGRAM:   ASSESSMENT:  CLINICAL IMPRESSION: Pt arrives for today's treatment session denying any pain, but reports feeling very weak today.  Pt able to perform TUG in 9.6 seconds today, surpassing his LTG.  Pt also able to perform STS transfer from slightly elevated plinth without UE support.  Pt requested session terminate early today due to fatigue.  Pt denied any pain at completion of today's treatment session.  OBJECTIVE IMPAIRMENTS: Abnormal gait, decreased activity tolerance, decreased balance, decreased mobility, difficulty walking, decreased strength, impaired sensation, and pain.   ACTIVITY LIMITATIONS: standing, stairs, transfers, and locomotion level  PARTICIPATION LIMITATIONS: meal prep, cleaning, laundry, community activity, and yard work  PERSONAL FACTORS: Fitness, Past/current experiences, Time since onset of injury/illness/exacerbation, Transportation, and 3+ comorbidities: Hypertension, atrial fibrillation, COPD, osteoarthritis, congestive heart failure, osteoporosis, depression, prostate cancer, current smoker, asthma, depression, and hard of hearing are also affecting patient's functional outcome.   REHAB POTENTIAL: Good  CLINICAL DECISION MAKING: Evolving/moderate complexity  EVALUATION COMPLEXITY: Moderate   GOALS: Goals reviewed with patient? Yes  SHORT TERM GOALS: Target date: 06/09/24 Patient will be independent with is initial HEP.  Baseline: Goal status: MET  2.  Patient will report being able to walk for at  least 5 minutes for improved functional mobility. Baseline: 8/26: approximately 3 mins Goal status: MET  3.  Patient will improve his gait speed to at least 0.60 m/s for improved functional mobility.  Baseline: 8/26: .64 m/s Goal status: MET  LONG TERM GOALS: Target date: 06/30/24  Patient will be independent with his advanced HEP.  Baseline:  Goal status: IN PROGRESS  2.  Patient will improve his timed up and go time to 12 seconds or less to reduce his fall risk.  Baseline:  8/26: 17.3 seconds; 9/11: 9.6 seconds Goal status: MET  3.  Patient will be able to transfer from sitting to standing without upper extremity support.  Baseline: 8/26: unable to 9/11: able to perform from slightly elevated plinth Goal status: PARTIALLY MET  4.  Patient will report being able to walk for least 10 minutes for improved function completing quick trips to the store. Baseline: 8/26: approximately 3 mins; 9/11: approximately 5 mins Goal status: IN PROGRESS  5.  Patient will improve his gait speed to at least 0.8 m/s for improved household mobility. Baseline: 8/26: .64 m/s Goal status: IN PROGRESS  PLAN:  PT FREQUENCY: 2x/week  PT DURATION: 6 weeks  PLANNED INTERVENTIONS: 97164- PT Re-evaluation, 97750- Physical Performance Testing, 97110-Therapeutic exercises, 97530- Therapeutic activity, 97112- Neuromuscular re-education, 97535- Self Care, 02859- Manual therapy, [phone]- Gait training, Patient/Family education, Balance training, and Stair training  PLAN FOR NEXT SESSION: NuStep, isometrics, gait training, and lower extremity strengthening with upper extremity interventions as an active rest break, continue balance training   Delon DELENA Gosling, PTA 07/15/2024, 1:38 PM

## 2024-07-16 ENCOUNTER — Ambulatory Visit

## 2024-07-16 DIAGNOSIS — C61 Malignant neoplasm of prostate: Secondary | ICD-10-CM | POA: Diagnosis not present

## 2024-07-16 DIAGNOSIS — C775 Secondary and unspecified malignant neoplasm of intrapelvic lymph nodes: Secondary | ICD-10-CM

## 2024-07-16 NOTE — Progress Notes (Signed)
 Firmagon  Sub Q Injection  Due to Prostate Cancer patient is present today for monthly Firmagon  Injection.  Order received and reviewed and authorization verification reviewed.   Medication: Firmagon  (Degarelix )  Dose: 80 mg Location: right upper abdomen cleaned and prepped with alcohol  prior to injection Lot: [id number] Exp: 12/05/2025  Patient tolerated well, no complications were noted Band aid applied over injection site.   Performed by: Carlos, CMA  Follow up: 1 month for injection. Appointment scheduled with patient.

## 2024-07-19 ENCOUNTER — Ambulatory Visit: Admitting: Physical Therapy

## 2024-07-20 ENCOUNTER — Ambulatory Visit: Admitting: Physical Therapy

## 2024-07-20 DIAGNOSIS — M6281 Muscle weakness (generalized): Secondary | ICD-10-CM | POA: Diagnosis not present

## 2024-07-20 NOTE — Therapy (Addendum)
 OUTPATIENT PHYSICAL THERAPY LOWER EXTREMITY TREATMENT   Patient Name: Dan [name] MRN: [id number] DOB:Mar 06, 1946, 78 y.o., male Today's Date: 07/20/2024  END OF SESSION:  PT End of Session - 07/20/24 1113     Visit Number 11    Number of Visits 12    Date for PT Re-Evaluation 07/30/24    PT Start Time 1058    PT Stop Time 1125    PT Time Calculation (min) 27 min    Activity Tolerance Patient limited by fatigue    Behavior During Therapy WFL for tasks assessed/performed           Past Medical History:  Diagnosis Date   Anemia    Arthritis    Asthma    BPH (benign prostatic hyperplasia)    COPD (chronic obstructive pulmonary disease) (HCC)    Depression    ED (erectile dysfunction)    Gout    Hyperlipidemia    Hypertension    Loss of hearing    Nocturia    Paroxysmal atrial fibrillation (HCC)    Prostate cancer (HCC)    Past Surgical History:  Procedure Laterality Date   CARDIOVASCULAR STRESS TEST  12/31/2004   normal perfuction nuclear study w/ no evidence ischemia /  normal LV function and wall motion , ef 52%   PROSTATECTOMY  2019   TOTAL HIP ARTHROPLASTY Left    TOTAL HIP ARTHROPLASTY Right 01/20/2024   Procedure: ARTHROPLASTY, HIP, TOTAL, ANTERIOR APPROACH;  Surgeon: Ernie Cough, MD;  Location: [facility];  Service: Orthopedics;  Laterality: Right;   Patient Active Problem List   Diagnosis Date Noted   Osteonecrosis of hip (HCC) 05/12/2024   S/P total right hip arthroplasty 01/20/2024   Osteoporosis 03/26/2023   Genetic testing 03/13/2023   Malignant neoplasm of prostate metastatic to intra-abdominal lymph node (HCC) 12/31/2022   Joint pain 07/03/2021   Other specified abnormal immunological findings in serum 07/03/2021   Primary osteoarthritis 07/03/2021   Abnormal CT scan, colon 01/22/2021   History of Clostridioides difficile colitis 01/22/2021   Diarrhea 01/22/2021   Dilated pancreatic duct 01/22/2021   Loss of appetite 11/27/2020   GERD  (gastroesophageal reflux disease) 11/11/2020   Hematochezia 11/11/2020   History of prostate cancer 11/11/2020   Chest wall pain 10/31/2020   Anemia 08/02/2020   Other specified personal risk factors, not elsewhere classified 07/18/2020   Body mass index (BMI) 22.0-22.9, adult 04/20/2020   PMR (polymyalgia rheumatica) (HCC) 03/29/2020   Fatigue 03/28/2020   Chronic diastolic CHF (congestive heart failure) (HCC) 03/09/2020   Atrial fibrillation (HCC) 12/15/2019   Claudication (HCC) 12/15/2019   Palpitations 12/15/2019   Cystitis 09/20/2019   Cigarette smoker 07/02/2019   Malignant neoplasm of prostate metastatic to intrapelvic lymph node (HCC) 09/23/2018   Elevated PSA 05/27/2018   Hyperlipidemia    Hypertension    COPD GOLD ? / Group E    Depression    Gout    Alcohol  dependency (HCC)    ED (erectile dysfunction)    Loss of hearing     PCP: Toribio Jerel MATSU, MD  REFERRING PROVIDER: Toribio Jerel MATSU, MD  REFERRING DIAG: generalized weakness   THERAPY DIAG:  Muscle weakness (generalized)  Rationale for Evaluation and Treatment: Rehabilitation  ONSET DATE: 1 year ago  SUBJECTIVE:   SUBJECTIVE STATEMENT: Pt denies any pain today, but states that he is just having a a day, without further explanation.   PERTINENT HISTORY: Hypertension, atrial fibrillation, COPD, osteoarthritis, congestive heart failure, osteoporosis, depression, prostate  cancer, current smoker, asthma, depression, and hard of hearing PAIN:  Are you having pain? No  PRECAUTIONS: Fall  RED FLAGS: None   WEIGHT BEARING RESTRICTIONS: No  FALLS:  Has patient fallen in last 6 months? Yes. Number of falls 4  LIVING ENVIRONMENT: Lives with: lives with their spouse Lives in: House/apartment Stairs: Yes: External: 3 steps; can reach both; step to pattern Has following equipment at home: Single point cane  OCCUPATION: disabled   PLOF: Independent  PATIENT GOALS: improved mobility and lower  extremity strength  NEXT MD VISIT: 06/08/24  OBJECTIVE:  Note: Objective measures were completed at Evaluation unless otherwise noted.  COGNITION: Overall cognitive status: Within functional limits for tasks assessed     SENSATION: Light touch: Impaired with diminished sensation in both feet.  Patient reports intermittent tingling in both feet, but none currently  EDEMA:  Moderate bilateral ankle edema observed  POSTURE: rounded shoulders, forward head, and flexed trunk   LOWER EXTREMITY ROM:   LOWER EXTREMITY MMT:  MMT Right eval Left eval  Hip flexion 3+/5 3/5  Hip extension    Hip abduction    Hip adduction    Hip internal rotation    Hip external rotation    Knee flexion 4+/5 4/5  Knee extension 4-/5 4-/5  Ankle dorsiflexion 3+/5 3+/5  Ankle plantarflexion    Ankle inversion    Ankle eversion     (Blank rows = not tested)  FUNCTIONAL TESTS:  Timed up and go (TUG): 23.37 seconds with SPC  Sit to stand: requires UE support from armrests and fatigued after 1 repetition  GAIT: Assistive device utilized: Single point cane Level of assistance: Modified independence Comments: Decreased stride length and foot clearance bilaterally   Gait speed: 0.47 m/s with single point cane                                                                                                                               TREATMENT DATE:    07/20/24:  Nustep level 2 x 15 minutes f/b 2# LAQ's and marching to fatigue x 2 f/b ball squeezes 2 minutes and 2 minutes seated hip abduction with red theraband.  07/15/24   EXERCISE LOG  Exercise Repetitions and Resistance Comments  Nustep Lvl 3  x 15 mins    Seated marching 2# to fatigue   Seated LAQ 2# to fatigue   Hip add isometrics    Hip abd isometrics    Seated hamstring curls    Heel/Toe Raises    Goal Assessment See Below        Blank cell = exercise not performed today  06/18/24:  Nustep level 1 x 2 x 15 minutes with frequent rest  breaks.   06/11/2024  EXERCISE LOG  Exercise Repetitions and Resistance Comments  Nustep  X10 mins   Seated marching 1x45s 3#   LAQ 2x30s 3#   Resisted dorsiflexion 2x12 w/red tb   Hip add iso 2x60s    Hip abd iso 2x60s   Heel raises  1x20    Blank cell = exercise not performed today                                     PATIENT EDUCATION:  Education details: POC, prognosis, objective findings, safety, fall risk, and goals for physical therapy Person educated: Patient Education method: Explanation Education comprehension: verbalized understanding  HOME EXERCISE PROGRAM:   ASSESSMENT:  CLINICAL IMPRESSION: Patient did fairly well today with was provided with red theraband for HEP (seated hip abduction).   OBJECTIVE IMPAIRMENTS: Abnormal gait, decreased activity tolerance, decreased balance, decreased mobility, difficulty walking, decreased strength, impaired sensation, and pain.   ACTIVITY LIMITATIONS: standing, stairs, transfers, and locomotion level  PARTICIPATION LIMITATIONS: meal prep, cleaning, laundry, community activity, and yard work  PERSONAL FACTORS: Fitness, Past/current experiences, Time since onset of injury/illness/exacerbation, Transportation, and 3+ comorbidities: Hypertension, atrial fibrillation, COPD, osteoarthritis, congestive heart failure, osteoporosis, depression, prostate cancer, current smoker, asthma, depression, and hard of hearing are also affecting patient's functional outcome.   REHAB POTENTIAL: Good  CLINICAL DECISION MAKING: Evolving/moderate complexity  EVALUATION COMPLEXITY: Moderate   GOALS: Goals reviewed with patient? Yes  SHORT TERM GOALS: Target date: 06/09/24 Patient will be independent with is initial HEP.  Baseline: Goal status: MET  2.  Patient will report being able to walk for at least 5 minutes for improved functional mobility. Baseline: 8/26: approximately 3 mins Goal status: MET  3.   Patient will improve his gait speed to at least 0.60 m/s for improved functional mobility.  Baseline: 8/26: .64 m/s Goal status: MET  LONG TERM GOALS: Target date: 06/30/24  Patient will be independent with his advanced HEP.  Baseline:  Goal status: IN PROGRESS  2.  Patient will improve his timed up and go time to 12 seconds or less to reduce his fall risk.  Baseline:  8/26: 17.3 seconds; 9/11: 9.6 seconds Goal status: MET  3.  Patient will be able to transfer from sitting to standing without upper extremity support.  Baseline: 8/26: unable to 9/11: able to perform from slightly elevated plinth Goal status: PARTIALLY MET  4.  Patient will report being able to walk for least 10 minutes for improved function completing quick trips to the store. Baseline: 8/26: approximately 3 mins; 9/11: approximately 5 mins Goal status: IN PROGRESS  5.  Patient will improve his gait speed to at least 0.8 m/s for improved household mobility. Baseline: 8/26: .64 m/s Goal status: IN PROGRESS  PLAN:  PT FREQUENCY: 2x/week  PT DURATION: 6 weeks  PLANNED INTERVENTIONS: 97164- PT Re-evaluation, 97750- Physical Performance Testing, 97110-Therapeutic exercises, 97530- Therapeutic activity, 97112- Neuromuscular re-education, 97535- Self Care, 02859- Manual therapy, [phone]- Gait training, Patient/Family education, Balance training, and Stair training  PLAN FOR NEXT SESSION: NuStep, isometrics, gait training, and lower extremity strengthening with upper extremity interventions as an active rest break, continue balance training   Moe Graca, [place], PT 07/20/2024, 11:52 AM

## 2024-07-27 ENCOUNTER — Other Ambulatory Visit: Payer: Self-pay | Admitting: Cardiology

## 2024-07-27 ENCOUNTER — Ambulatory Visit

## 2024-07-27 DIAGNOSIS — M6281 Muscle weakness (generalized): Secondary | ICD-10-CM | POA: Diagnosis not present

## 2024-07-27 NOTE — Therapy (Signed)
 OUTPATIENT PHYSICAL THERAPY LOWER EXTREMITY TREATMENT   Patient Name: Dan [name] MRN: [id number] DOB:1946-02-22, 78 y.o., male Today's Date: 07/27/2024  END OF SESSION:  PT End of Session - 07/27/24 1102     Visit Number 12    Number of Visits 12    Date for Recertification  07/30/24    PT Start Time 1100    PT Stop Time 1138    PT Time Calculation (min) 38 min    Activity Tolerance Patient limited by fatigue    Behavior During Therapy [facility] for tasks assessed/performed           Past Medical History:  Diagnosis Date   Anemia    Arthritis    Asthma    BPH (benign prostatic hyperplasia)    COPD (chronic obstructive pulmonary disease) (HCC)    Depression    ED (erectile dysfunction)    Gout    Hyperlipidemia    Hypertension    Loss of hearing    Nocturia    Paroxysmal atrial fibrillation (HCC)    Prostate cancer (HCC)    Past Surgical History:  Procedure Laterality Date   CARDIOVASCULAR STRESS TEST  12/31/2004   normal perfuction nuclear study w/ no evidence ischemia /  normal LV function and wall motion , ef 52%   PROSTATECTOMY  2019   TOTAL HIP ARTHROPLASTY Left    TOTAL HIP ARTHROPLASTY Right 01/20/2024   Procedure: ARTHROPLASTY, HIP, TOTAL, ANTERIOR APPROACH;  Surgeon: Ernie Cough, MD;  Location: [facility];  Service: Orthopedics;  Laterality: Right;   Patient Active Problem List   Diagnosis Date Noted   Osteonecrosis of hip (HCC) 05/12/2024   S/P total right hip arthroplasty 01/20/2024   Osteoporosis 03/26/2023   Genetic testing 03/13/2023   Malignant neoplasm of prostate metastatic to intra-abdominal lymph node (HCC) 12/31/2022   Joint pain 07/03/2021   Other specified abnormal immunological findings in serum 07/03/2021   Primary osteoarthritis 07/03/2021   Abnormal CT scan, colon 01/22/2021   History of Clostridioides difficile colitis 01/22/2021   Diarrhea 01/22/2021   Dilated pancreatic duct 01/22/2021   Loss of appetite 11/27/2020   GERD  (gastroesophageal reflux disease) 11/11/2020   Hematochezia 11/11/2020   History of prostate cancer 11/11/2020   Chest wall pain 10/31/2020   Anemia 08/02/2020   Other specified personal risk factors, not elsewhere classified 07/18/2020   Body mass index (BMI) 22.0-22.9, adult 04/20/2020   PMR (polymyalgia rheumatica) 03/29/2020   Fatigue 03/28/2020   Chronic diastolic CHF (congestive heart failure) (HCC) 03/09/2020   Atrial fibrillation (HCC) 12/15/2019   Claudication 12/15/2019   Palpitations 12/15/2019   Cystitis 09/20/2019   Cigarette smoker 07/02/2019   Malignant neoplasm of prostate metastatic to intrapelvic lymph node (HCC) 09/23/2018   Elevated PSA 05/27/2018   Hyperlipidemia    Hypertension    COPD GOLD ? / Group E    Depression    Gout    Alcohol  dependency (HCC)    ED (erectile dysfunction)    Loss of hearing     PCP: Toribio Jerel MATSU, MD  REFERRING PROVIDER: Toribio Jerel MATSU, MD  REFERRING DIAG: generalized weakness   THERAPY DIAG:  Muscle weakness (generalized)  Rationale for Evaluation and Treatment: Rehabilitation  ONSET DATE: 1 year ago  SUBJECTIVE:   SUBJECTIVE STATEMENT: Pt denies any pain today, but states that he is just having a a day, without further explanation.   PERTINENT HISTORY: Hypertension, atrial fibrillation, COPD, osteoarthritis, congestive heart failure, osteoporosis, depression, prostate cancer, current  smoker, asthma, depression, and hard of hearing PAIN:  Are you having pain? No  PRECAUTIONS: Fall  RED FLAGS: None   WEIGHT BEARING RESTRICTIONS: No  FALLS:  Has patient fallen in last 6 months? Yes. Number of falls 4  LIVING ENVIRONMENT: Lives with: lives with their spouse Lives in: House/apartment Stairs: Yes: External: 3 steps; can reach both; step to pattern Has following equipment at home: Single point cane  OCCUPATION: disabled   PLOF: Independent  PATIENT GOALS: improved mobility and lower extremity  strength  NEXT MD VISIT: 06/08/24  OBJECTIVE:  Note: Objective measures were completed at Evaluation unless otherwise noted.  COGNITION: Overall cognitive status: Within functional limits for tasks assessed     SENSATION: Light touch: Impaired with diminished sensation in both feet.  Patient reports intermittent tingling in both feet, but none currently  EDEMA:  Moderate bilateral ankle edema observed  POSTURE: rounded shoulders, forward head, and flexed trunk   LOWER EXTREMITY ROM:   LOWER EXTREMITY MMT:  MMT Right eval Left eval  Hip flexion 3+/5 3/5  Hip extension    Hip abduction    Hip adduction    Hip internal rotation    Hip external rotation    Knee flexion 4+/5 4/5  Knee extension 4-/5 4-/5  Ankle dorsiflexion 3+/5 3+/5  Ankle plantarflexion    Ankle inversion    Ankle eversion     (Blank rows = not tested)  FUNCTIONAL TESTS:  Timed up and go (TUG): 23.37 seconds with SPC  Sit to stand: requires UE support from armrests and fatigued after 1 repetition  GAIT: Assistive device utilized: Single point cane Level of assistance: Modified independence Comments: Decreased stride length and foot clearance bilaterally   Gait speed: 0.47 m/s with single point cane                                                                                                                               TREATMENT DATE:    07/27/24   EXERCISE LOG  Exercise Repetitions and Resistance Comments  Nustep Lvl 3  x 15 mins    Seated marching 2# to fatigue   Seated LAQ 2# to fatigue   Hip add isometrics to fatigue   Hip abd isometrics Red to fatigue   Seated hamstring curls Red x 20 reps bil   Heel/Toe Raises    Goal Assessment See Below        Blank cell = exercise not performed today  07/20/24:  Nustep level 2 x 15 minutes f/b 2# LAQ's and marching to fatigue x 2 f/b ball squeezes 2 minutes and 2 minutes seated hip abduction with red theraband.  07/15/24   EXERCISE  LOG  Exercise Repetitions and Resistance Comments  Nustep Lvl 3  x 15 mins    Seated marching 2# to fatigue   Seated LAQ 2# to fatigue   Hip add isometrics    Hip abd isometrics  Seated hamstring curls    Heel/Toe Raises    Goal Assessment See Below        Blank cell = exercise not performed today    PATIENT EDUCATION:  Education details: POC, prognosis, objective findings, safety, fall risk, and goals for physical therapy Person educated: Patient Education method: Explanation Education comprehension: verbalized understanding  HOME EXERCISE PROGRAM:   ASSESSMENT:  CLINICAL IMPRESSION: Pt arrives for today's treatment session denying any pain, but reports feeling worn out.  Pt unable to perform STS from chair of regular heigh, due to left knee wanting to give out.  Pt able to increase gait speed to .65 m/s today.  Pt able to tolerate increased seated exercises today with fatigue noted, but no pain.  Pt has made minimal progress towards his goals at this time.  Pt encouraged to call the facility with any questions or concerns.  Pt denied any pain at completion of today's treatment session.  Pt ready for discharge at this time.   OBJECTIVE IMPAIRMENTS: Abnormal gait, decreased activity tolerance, decreased balance, decreased mobility, difficulty walking, decreased strength, impaired sensation, and pain.   ACTIVITY LIMITATIONS: standing, stairs, transfers, and locomotion level  PARTICIPATION LIMITATIONS: meal prep, cleaning, laundry, community activity, and yard work  PERSONAL FACTORS: Fitness, Past/current experiences, Time since onset of injury/illness/exacerbation, Transportation, and 3+ comorbidities: Hypertension, atrial fibrillation, COPD, osteoarthritis, congestive heart failure, osteoporosis, depression, prostate cancer, current smoker, asthma, depression, and hard of hearing are also affecting patient's functional outcome.   REHAB POTENTIAL: Good  CLINICAL DECISION  MAKING: Evolving/moderate complexity  EVALUATION COMPLEXITY: Moderate   GOALS: Goals reviewed with patient? Yes  SHORT TERM GOALS: Target date: 06/09/24 Patient will be independent with is initial HEP.  Baseline: Goal status: MET  2.  Patient will report being able to walk for at least 5 minutes for improved functional mobility. Baseline: 8/26: approximately 3 mins Goal status: MET  3.  Patient will improve his gait speed to at least 0.60 m/s for improved functional mobility.  Baseline: 8/26: .64 m/s Goal status: MET  LONG TERM GOALS: Target date: 06/30/24  Patient will be independent with his advanced HEP.  Baseline:  Goal status: IN PROGRESS  2.  Patient will improve his timed up and go time to 12 seconds or less to reduce his fall risk.  Baseline:  8/26: 17.3 seconds; 9/11: 9.6 seconds Goal status: MET  3.  Patient will be able to transfer from sitting to standing without upper extremity support.  Baseline: 8/26: unable to 9/11: able to perform from slightly elevated plinth Goal status: PARTIALLY MET  4.  Patient will report being able to walk for least 10 minutes for improved function completing quick trips to the store. Baseline: 8/26: approximately 3 mins; 9/11: approximately 5 mins; 9/23: approx 5 mins Goal status: IN PROGRESS  5.  Patient will improve his gait speed to at least 0.8 m/s for improved household mobility. Baseline: 8/26: .64 m/s; 9/23: .65 m/s Goal status: IN PROGRESS  PLAN:  PT FREQUENCY: 2x/week  PT DURATION: 6 weeks  PLANNED INTERVENTIONS: 97164- PT Re-evaluation, 97750- Physical Performance Testing, 97110-Therapeutic exercises, 97530- Therapeutic activity, 97112- Neuromuscular re-education, 97535- Self Care, [id number]- Manual therapy, [phone]- Gait training, Patient/Family education, Balance training, and Stair training  PLAN FOR NEXT SESSION: NuStep, isometrics, gait training, and lower extremity strengthening with upper extremity interventions as  an active rest break, continue balance training   Delon DELENA Gosling, PTA 07/27/2024, 11:43 AM

## 2024-07-28 ENCOUNTER — Other Ambulatory Visit (INDEPENDENT_AMBULATORY_CARE_PROVIDER_SITE_OTHER): Payer: Self-pay

## 2024-07-28 ENCOUNTER — Encounter: Payer: Self-pay | Admitting: Orthopaedic Surgery

## 2024-07-28 ENCOUNTER — Ambulatory Visit (INDEPENDENT_AMBULATORY_CARE_PROVIDER_SITE_OTHER): Admitting: Orthopaedic Surgery

## 2024-07-28 ENCOUNTER — Encounter: Payer: Self-pay | Admitting: Oncology

## 2024-07-28 DIAGNOSIS — M545 Low back pain, unspecified: Secondary | ICD-10-CM

## 2024-07-28 DIAGNOSIS — C61 Malignant neoplasm of prostate: Secondary | ICD-10-CM

## 2024-07-28 DIAGNOSIS — M25552 Pain in left hip: Secondary | ICD-10-CM | POA: Diagnosis not present

## 2024-07-28 DIAGNOSIS — C775 Secondary and unspecified malignant neoplasm of intrapelvic lymph nodes: Secondary | ICD-10-CM | POA: Diagnosis not present

## 2024-07-28 NOTE — Patient Instructions (Signed)
 Order has been placed for MRI please call [phone] to schedule this appt.   Referral has been placed for Physical therapy in [place], they will call to schedule the appt.

## 2024-07-28 NOTE — Progress Notes (Signed)
 I hurt.  He has chronic pain of the lower back and left hip.  He had a total hip on the right earlier this year by Dr. Ernie. He is post bipolar hip on the left.  He has prostate cancer with metastasis.  He is being actively treated for this.  He has no new trauma.  He has been seen by PT in [place] for generalized weakness.  He was just discharged with about 50 % improvement.  He is still weak.  He has pain going down the left side to the left foot from the back and the hip.  He is uncomfortable all the time.  The right hip has done well from surgery.  He is weak.  ROM of the back is limited, ROM of the left hip is painful (more from the back) but good, NV intact, weakly positive straight leg on the left at 30, limp to the left, uses cane, muscle tone and strength good but weak.  Encounter Diagnoses  Name Primary?   Pain in left hip Yes   Lumbar pain    Malignant neoplasm of prostate metastatic to intrapelvic lymph node (HCC)    X-rays were done of the lumbar spine and left hip.  I have reviewed the notes from PT and his hip surgery.  I feel he needs more PT to help his generalized weakness.  He had a MRI in January of this year and the treatment was done to the right hip but his pain running down the left side makes me worry more about a HNP of the lower back or a lesion from his cancer.  I want a new MRI with and without contrast done.  He is agreeable to this.  I am retiring next week and further appointments to be made her in this office by other providers.  He is agreeable to this as well.  Call if any problem.  Precautions discussed.  Electronically Signed Lemond Stable, MD 9/24/202510:24 AM

## 2024-07-29 ENCOUNTER — Other Ambulatory Visit (HOSPITAL_COMMUNITY): Payer: Self-pay

## 2024-08-02 ENCOUNTER — Encounter: Payer: Self-pay | Admitting: Oncology

## 2024-08-02 ENCOUNTER — Other Ambulatory Visit: Payer: Self-pay | Admitting: *Deleted

## 2024-08-02 MED ORDER — METHYLPHENIDATE HCL 5 MG PO TABS: 5.0000 mg | ORAL_TABLET | Freq: Two times a day (BID) | ORAL | 0 refills | Status: DC | PRN

## 2024-08-05 ENCOUNTER — Ambulatory Visit: Payer: Self-pay | Admitting: Physical Therapy

## 2024-08-05 DIAGNOSIS — R0789 Other chest pain: Secondary | ICD-10-CM | POA: Diagnosis not present

## 2024-08-05 DIAGNOSIS — M79605 Pain in left leg: Secondary | ICD-10-CM | POA: Diagnosis not present

## 2024-08-05 DIAGNOSIS — M25552 Pain in left hip: Secondary | ICD-10-CM | POA: Diagnosis not present

## 2024-08-05 DIAGNOSIS — R079 Chest pain, unspecified: Secondary | ICD-10-CM | POA: Diagnosis not present

## 2024-08-05 DIAGNOSIS — W101XXA Fall (on)(from) sidewalk curb, initial encounter: Secondary | ICD-10-CM | POA: Diagnosis not present

## 2024-08-06 ENCOUNTER — Encounter: Payer: Self-pay | Admitting: Oncology

## 2024-08-06 ENCOUNTER — Other Ambulatory Visit: Payer: Self-pay

## 2024-08-06 ENCOUNTER — Emergency Department (HOSPITAL_COMMUNITY)
Admission: EM | Admit: 2024-08-06 | Discharge: 2024-08-06 | Disposition: A | Discharge status: Home / Self Care | Attending: Emergency Medicine | Admitting: Emergency Medicine

## 2024-08-06 ENCOUNTER — Emergency Department (HOSPITAL_COMMUNITY)

## 2024-08-06 ENCOUNTER — Encounter (HOSPITAL_COMMUNITY): Payer: Self-pay

## 2024-08-06 DIAGNOSIS — Z7901 Long term (current) use of anticoagulants: Secondary | ICD-10-CM | POA: Diagnosis not present

## 2024-08-06 DIAGNOSIS — M25552 Pain in left hip: Secondary | ICD-10-CM | POA: Diagnosis not present

## 2024-08-06 DIAGNOSIS — S3993XA Unspecified injury of pelvis, initial encounter: Secondary | ICD-10-CM | POA: Diagnosis not present

## 2024-08-06 DIAGNOSIS — S0990XA Unspecified injury of head, initial encounter: Secondary | ICD-10-CM | POA: Diagnosis not present

## 2024-08-06 DIAGNOSIS — Z96643 Presence of artificial hip joint, bilateral: Secondary | ICD-10-CM | POA: Diagnosis not present

## 2024-08-06 DIAGNOSIS — W19XXXA Unspecified fall, initial encounter: Secondary | ICD-10-CM | POA: Diagnosis not present

## 2024-08-06 DIAGNOSIS — S79912A Unspecified injury of left hip, initial encounter: Secondary | ICD-10-CM | POA: Diagnosis present

## 2024-08-06 DIAGNOSIS — I6782 Cerebral ischemia: Secondary | ICD-10-CM | POA: Diagnosis not present

## 2024-08-06 DIAGNOSIS — B9689 Other specified bacterial agents as the cause of diseases classified elsewhere: Secondary | ICD-10-CM | POA: Insufficient documentation

## 2024-08-06 DIAGNOSIS — R531 Weakness: Secondary | ICD-10-CM | POA: Diagnosis not present

## 2024-08-06 DIAGNOSIS — I10 Essential (primary) hypertension: Secondary | ICD-10-CM | POA: Diagnosis not present

## 2024-08-06 DIAGNOSIS — R109 Unspecified abdominal pain: Secondary | ICD-10-CM | POA: Insufficient documentation

## 2024-08-06 DIAGNOSIS — M8580 Other specified disorders of bone density and structure, unspecified site: Secondary | ICD-10-CM | POA: Diagnosis not present

## 2024-08-06 DIAGNOSIS — M549 Dorsalgia, unspecified: Secondary | ICD-10-CM | POA: Diagnosis not present

## 2024-08-06 DIAGNOSIS — N390 Urinary tract infection, site not specified: Secondary | ICD-10-CM | POA: Insufficient documentation

## 2024-08-06 DIAGNOSIS — S7002XA Contusion of left hip, initial encounter: Secondary | ICD-10-CM | POA: Diagnosis not present

## 2024-08-06 DIAGNOSIS — M47816 Spondylosis without myelopathy or radiculopathy, lumbar region: Secondary | ICD-10-CM | POA: Diagnosis not present

## 2024-08-06 DIAGNOSIS — I771 Stricture of artery: Secondary | ICD-10-CM | POA: Diagnosis not present

## 2024-08-06 DIAGNOSIS — J42 Unspecified chronic bronchitis: Secondary | ICD-10-CM | POA: Diagnosis not present

## 2024-08-06 DIAGNOSIS — N2889 Other specified disorders of kidney and ureter: Secondary | ICD-10-CM | POA: Diagnosis not present

## 2024-08-06 LAB — CBC WITH DIFFERENTIAL/PLATELET
Abs Immature Granulocytes: 0.13 K/uL — ABNORMAL HIGH (ref 0.00–0.07)
Basophils Absolute: 0.1 K/uL (ref 0.0–0.1)
Basophils Relative: 1 %
Eosinophils Absolute: 0.4 K/uL (ref 0.0–0.5)
Eosinophils Relative: 3 %
HCT: 31.3 % — ABNORMAL LOW (ref 39.0–52.0)
Hemoglobin: 10.1 g/dL — ABNORMAL LOW (ref 13.0–17.0)
Immature Granulocytes: 1 %
Lymphocytes Relative: 9 %
Lymphs Abs: 1.3 K/uL (ref 0.7–4.0)
MCH: 29.4 pg (ref 26.0–34.0)
MCHC: 32.3 g/dL (ref 30.0–36.0)
MCV: 91 fL (ref 80.0–100.0)
Monocytes Absolute: 0.8 K/uL (ref 0.1–1.0)
Monocytes Relative: 6 %
Neutro Abs: 12.3 K/uL — ABNORMAL HIGH (ref 1.7–7.7)
Neutrophils Relative %: 80 %
Platelets: 346 K/uL (ref 150–400)
RBC: 3.44 MIL/uL — ABNORMAL LOW (ref 4.22–5.81)
RDW: 16.4 % — ABNORMAL HIGH (ref 11.5–15.5)
WBC: 15.1 K/uL — ABNORMAL HIGH (ref 4.0–10.5)
nRBC: 0 % (ref 0.0–0.2)

## 2024-08-06 LAB — COMPREHENSIVE METABOLIC PANEL WITH GFR
ALT: 10 U/L (ref 0–44)
AST: 13 U/L — ABNORMAL LOW (ref 15–41)
Albumin: 4.1 g/dL (ref 3.5–5.0)
Alkaline Phosphatase: 79 U/L (ref 38–126)
Anion gap: 13 (ref 5–15)
BUN: 24 mg/dL — ABNORMAL HIGH (ref 8–23)
CO2: 20 mmol/L — ABNORMAL LOW (ref 22–32)
Calcium: 11 mg/dL — ABNORMAL HIGH (ref 8.9–10.3)
Chloride: 96 mmol/L — ABNORMAL LOW (ref 98–111)
Creatinine, Ser: 1.1 mg/dL (ref 0.61–1.24)
GFR, Estimated: >60 mL/min (ref >60)
Glucose, Bld: 89 mg/dL (ref 70–99)
Potassium: 4 mmol/L (ref 3.5–5.1)
Sodium: 130 mmol/L — ABNORMAL LOW (ref 135–145)
Total Bilirubin: 0.6 mg/dL (ref 0.0–1.2)
Total Protein: 6.6 g/dL (ref 6.5–8.1)

## 2024-08-06 LAB — URINALYSIS, W/ REFLEX TO CULTURE (INFECTION SUSPECTED)
Bilirubin Urine: NEGATIVE
Glucose, UA: NEGATIVE mg/dL
Ketones, ur: NEGATIVE mg/dL
Nitrite: NEGATIVE
Protein, ur: 30 mg/dL — AB
RBC / HPF: >50 RBC/hpf (ref 0–5)
Specific Gravity, Urine: 1.02 (ref 1.005–1.030)
WBC, UA: >50 WBC/hpf (ref 0–5)
pH: 6 (ref 5.0–8.0)

## 2024-08-06 LAB — PROTIME-INR
INR: 1 (ref 0.8–1.2)
Prothrombin Time: 13.7 s (ref 11.4–15.2)

## 2024-08-06 MED ORDER — CEPHALEXIN 500 MG PO CAPS: 500.0000 mg | ORAL_CAPSULE | Freq: Four times a day (QID) | ORAL | 0 refills | Status: DC

## 2024-08-06 MED ORDER — HYDROCODONE-ACETAMINOPHEN 10-325 MG PO TABS: 1.0000 | ORAL_TABLET | Freq: Four times a day (QID) | ORAL | 0 refills | Status: DC | PRN

## 2024-08-06 NOTE — Discharge Instructions (Addendum)
 It was a pleasure caring for you today in the emergency department.  Please follow-up with your PCP.  Please use your walker when ambulating at home.  Please return to the emergency department for any worsening or worrisome symptoms.

## 2024-08-06 NOTE — Care Management (Signed)
 Consult for set up of home health after a fall. No HH history in [place].  Messaged with Amy at [facility] to accept for PT and OT.

## 2024-08-06 NOTE — ED Provider Notes (Signed)
 [place] EMERGENCY DEPARTMENT AT [facility] Provider Note   CSN: [id number] Arrival date & time: 08/06/24  1157     Patient presents with: Dan [name] is a 78 y.o. male.  Patient brought in by ambulance from home.  He reportedly had a mechanical fall a few days ago and has had increased pain and weakness especially to his left hip.  Possibly saw PCP yesterday for same.  Possibly still on a blood thinner.  Patient is rather poor historian.  He said he was able to ambulate today though.   The history is provided by the patient and the EMS personnel.  Fall This is a new problem. The current episode started more than 2 days ago. The problem has not changed since onset.Pertinent negatives include no chest pain, no abdominal pain, no headaches and no shortness of breath. The symptoms are aggravated by walking. Nothing relieves the symptoms. He has tried rest for the symptoms. The treatment provided no relief.       Prior to Admission medications   Medication Sig Start Date End Date Taking? Authorizing Provider  abiraterone  acetate (ZYTIGA ) 250 MG tablet Take 4 tablets (1,000 mg total) by mouth daily. Take on an empty stomach 1 hour before or 2 hours after a meal 11/26/23   Katragadda, Sreedhar, MD  allopurinol  (ZYLOPRIM ) 100 MG tablet Take 100 mg by mouth daily. 01/30/21   [provider]  Ascorbic Acid (VITAMIN C) 1000 MG tablet Take 1,000 mg by mouth daily.    [provider]  budesonide-glycopyrrolate-formoterol (BREZTRI  AEROSPHERE) 160-9-4.8 MCG/ACT AERO inhaler Inhale 2 puffs into the lungs in the morning and at bedtime. Patient not taking: Reported on 06/28/2024 05/17/24   Darlean Ozell NOVAK, MD  cholecalciferol (VITAMIN D3) 25 MCG (1000 UNIT) tablet Take 1,000 Units by mouth daily.    [provider]  diltiazem  (CARDIZEM  CD) 360 MG 24 hr capsule Take 360 mg by mouth daily. 03/14/22   [provider]  HYDROcodone -acetaminophen  (NORCO)  10-325 MG tablet Take 1 tablet by mouth every 6 (six) hours as needed. 12/03/23   Brenna Lin, MD  losartan  (COZAAR ) 25 MG tablet TAKE 1 TABLET (25 MG TOTAL) BY MOUTH DAILY. 07/27/24   Alvan Dorn FALCON, MD  magnesium  oxide (MAG-OX) 400 (240 Mg) MG tablet TAKE 1 TABLET BY MOUTH EVERY DAY 06/10/23   Rogers Hai, MD  megestrol  (MEGACE ) 40 MG/ML suspension Take 400 mg by mouth daily as needed (appetite). 12/29/22   [provider]  methocarbamol  (ROBAXIN ) 500 MG tablet Take 1 tablet (500 mg total) by mouth every 6 (six) hours as needed for muscle spasms. 01/21/24   Patti Rosina SAUNDERS, PA-C  methylphenidate  (RITALIN ) 5 MG tablet Take 1 tablet (5 mg total) by mouth 2 (two) times daily as needed. Take 1 tab with breakfast and 1 tab with lunch as needed 08/02/24   Kandala, Hyndavi, MD  mirtazapine (REMERON) 30 MG tablet Take 30 mg by mouth at bedtime. Patient not taking: Reported on 06/28/2024 03/12/24   [provider]  polyethylene glycol (MIRALAX  / GLYCOLAX ) 17 g packet Take 17 g by mouth 2 (two) times daily. Patient not taking: Reported on 06/28/2024 01/21/24   Patti Rosina SAUNDERS, PA-C  potassium chloride  (MICRO-K ) 10 MEQ CR capsule TAKE 2 CAPSULES BY MOUTH DAILY. 09/06/20   Richarda Prentice LITTIE Mickey., NP  predniSONE  (DELTASONE ) 5 MG tablet Take 1 tablet (5 mg total) by mouth daily with breakfast. 08/18/23   Rogers Hai,  MD  rivaroxaban  (XARELTO ) 10 MG TABS tablet Take 1 tablet (10 mg total) by mouth daily with breakfast for 21 days. Patient not taking: Reported on 06/28/2024 01/21/24 05/06/24  Patti Rosina SAUNDERS, PA-C  TRELEGY ELLIPTA 200-62.5-25 MCG/ACT AEPB Inhale 1 puff into the lungs daily. 01/21/22   [provider]  Zinc 20 MG CAPS Take 40 mg by mouth daily.    [provider]    Allergies: Tamsulosin    Review of Systems  Respiratory:  Negative for shortness of breath.   Cardiovascular:  Negative for chest pain.  Gastrointestinal:  Negative for abdominal  pain.  Neurological:  Negative for headaches.    Updated Vital Signs BP 138/80 (BP Location: Right Arm)   Pulse 95   Temp 99 F (37.2 C) (Oral)   Resp 18   Ht 5' 10 (1.778 m)   Wt 63.5 kg   SpO2 96%   BMI 20.09 kg/m   Physical Exam Vitals and nursing note reviewed.  Constitutional:      General: He is not in acute distress.    Appearance: Normal appearance. He is well-developed.  HENT:     Head: Normocephalic and atraumatic.  Eyes:     Conjunctiva/sclera: Conjunctivae normal.  Cardiovascular:     Rate and Rhythm: Normal rate and regular rhythm.     Heart sounds: No murmur heard. Pulmonary:     Effort: Pulmonary effort is normal. No respiratory distress.     Breath sounds: Normal breath sounds.  Abdominal:     Palpations: Abdomen is soft.     Tenderness: There is no abdominal tenderness. There is no guarding or rebound.  Musculoskeletal:        General: Tenderness present. No deformity.     Cervical back: Neck supple.     Comments: Patient has some tenderness of his left hip.  There is no shortening or rotation though.  Knees and ankles nontender.  Upper extremities full range of motion without any pain or limitations  Skin:    General: Skin is warm and dry.     Capillary Refill: Capillary refill takes less than 2 seconds.  Neurological:     General: No focal deficit present.     Mental Status: He is alert and oriented to person, place, and time.     Motor: No weakness.     (all labs ordered are listed, but only abnormal results are displayed) Labs Reviewed  COMPREHENSIVE METABOLIC PANEL WITH GFR - Abnormal; Notable for the following components:      Result Value   Sodium 130 (*)    Chloride 96 (*)    CO2 20 (*)    BUN 24 (*)    Calcium 11.0 (*)    AST 13 (*)    All other components within normal limits  CBC WITH DIFFERENTIAL/PLATELET - Abnormal; Notable for the following components:   WBC 15.1 (*)    RBC 3.44 (*)    Hemoglobin 10.1 (*)    HCT 31.3 (*)     RDW 16.4 (*)    Neutro Abs 12.3 (*)    Abs Immature Granulocytes 0.13 (*)    All other components within normal limits  URINALYSIS, W/ REFLEX TO CULTURE (INFECTION SUSPECTED) - Abnormal; Notable for the following components:   APPearance HAZY (*)    Hgb urine dipstick SMALL (*)    Protein, ur 30 (*)    Leukocytes,Ua MODERATE (*)    Bacteria, UA RARE (*)    All other  components within normal limits  URINE CULTURE  PROTIME-INR    EKG: EKG Interpretation Date/Time:  Friday August 06 2024 12:32:07 EDT Ventricular Rate:  99 PR Interval:    QRS Duration:  95 QT Interval:  387 QTC Calculation: 444 R Axis:   85  Text Interpretation: Atrial fibrillation Borderline right axis deviation No old tracing to compare Confirmed by Towana Sharper [phone]) on 08/06/2024 12:33:56 PM  Radiology: CT PELVIS WO CONTRAST Result Date: 08/06/2024 CLINICAL DATA:  Hip trauma fall EXAM: CT PELVIS WITHOUT CONTRAST TECHNIQUE: Multidetector CT imaging of the pelvis was performed following the standard protocol without intravenous contrast. RADIATION DOSE REDUCTION: This exam was performed according to the departmental dose-optimization program which includes automated exposure control, adjustment of the mA and/or kV according to patient size and/or use of iterative reconstruction technique. COMPARISON:  08/06/2024, PET CT 12/19/2022 FINDINGS: Urinary Tract: Bladder is largely obscured by artifact from hip hardware. Bowel:  No obvious bowel wall thickening. Vascular/Lymphatic: Advanced aortic atherosclerosis. No obvious aneurysm. No suspicious nodes. Reproductive:  Obscured by artifact Other: No significant pelvic effusion. Partially visualized rim calcified cyst in the right kidney, previously characterized as [place] 2 on prior imaging. Musculoskeletal: Bilateral hip replacements demonstrate normal alignment. Artifact limits osseous detail. Bones appear osteopenic. No definitive acute fracture seen allowing for  artifact. Chronic L1 compression deformity IMPRESSION: 1. Bilateral hip replacements with artifact limiting osseous detail. No definitive acute fracture is seen allowing for artifact. 2. Aortic atherosclerosis. Aortic Atherosclerosis (ICD10-I70.0). Electronically Signed   By: Luke Bun M.D.   On: 08/06/2024 16:29   CT Head Wo Contrast Result Date: 08/06/2024 CLINICAL DATA:  Fall yesterday EXAM: CT HEAD WITHOUT CONTRAST TECHNIQUE: Contiguous axial images were obtained from the base of the skull through the vertex without intravenous contrast. RADIATION DOSE REDUCTION: This exam was performed according to the departmental dose-optimization program which includes automated exposure control, adjustment of the mA and/or kV according to patient size and/or use of iterative reconstruction technique. COMPARISON:  MRI 06/11/2024 FINDINGS: Brain: No acute territorial infarction, hemorrhage or intracranial mass. Moderate atrophy and chronic small vessel ischemic changes of the white matter. Stable ventricle size Vascular: No hyperdense vessels.  Carotid vascular calcification Skull: Normal. Negative for fracture or focal lesion. Sinuses/Orbits: No acute finding. Other: None IMPRESSION: 1. No CT evidence for acute intracranial abnormality. 2. Atrophy and chronic small vessel ischemic changes of the white matter. Electronically Signed   By: Luke Bun M.D.   On: 08/06/2024 16:19   DG Hip Unilat With Pelvis 2-3 Views Left Result Date: 08/06/2024 CLINICAL DATA:  Left hip pain following a fall yesterday. EXAM: DG HIP (WITH OR WITHOUT PELVIS) 2-3V LEFT COMPARISON:  08/05/2024 FINDINGS: Stable bilateral hip prostheses. Old, healed proximal femoral metadiaphyseal fracture on the left. No acute fracture or dislocation seen. Diffuse osteopenia. Atheromatous arterial calcifications. Lower lumbar spine degenerative changes. IMPRESSION: 1. No acute fracture or dislocation. 2. Stable bilateral hip prostheses. 3. Old, healed  proximal left femoral fracture. Electronically Signed   By: Elspeth Bathe M.D.   On: 08/06/2024 13:18   DG Chest 1 View Result Date: 08/06/2024 CLINICAL DATA:  Back pain following a fall yesterday. EXAM: CHEST  1 VIEW COMPARISON:  08/05/2024 FINDINGS: Normal-sized heart. Tortuous and partially calcified thoracic aorta. Clear lungs with normal vascularity. Stable mild-to-moderate chronic peribronchial thickening. Diffuse osteopenia. Old, healed left rib fractures. No acute fracture or pneumothorax seen. Bilateral shoulder degenerative changes. IMPRESSION: 1. No acute abnormality. 2. Stable mild to moderate chronic bronchitic changes.  Electronically Signed   By: Elspeth Bathe M.D.   On: 08/06/2024 13:16     Procedures   Medications Ordered in the ED - No data to display  Clinical Course as of 08/06/24 1656  Fri Aug 06, 2024  1249 Patient's son is here and able to give a little bit more history.  He is getting therapy for left hip pain after fall.  Had a new fall yesterday and reinjured left hip and possibly chest.  Also is been complaining of some dysuria.  They had x-rays at PCP that did not show anything but they wanted to come here to make sure. [MB]  15[address] Handoff MCB 78 yo/m  Here w/ fall +xarelto  Abx for UTI, wbc chronic elev, no fever CT pending  [SG]    Clinical Course User Index [MB] Towana Ozell BROCKS, MD [SG] Elnor Jayson LABOR, DO                                 Medical Decision Making Amount and/or Complexity of Data Reviewed Labs: ordered. Radiology: ordered.  Risk Prescription drug management.   This patient complains of left hip pain after fall; this involves an extensive number of treatment Options and is a complaint that carries with it a high risk of complications and morbidity. The differential includes fracture, contusion, dislocation, metabolic derangement, infection  I ordered, reviewed and interpreted labs, which included CBC with elevated white count low stable  hemoglobin, chemistries with low bicarb low sodium I ordered imaging studies which included chest and left hip x-ray, CT head and pelvis and I independently    visualized and interpreted imaging which showed no acute traumatic findings Additional history obtained from patient's son Previous records obtained and reviewed in epic including recent orthopedic notes Cardiac monitoring reviewed, atrial fibrillation Social determinants considered, tobacco use Critical Interventions: None  After the interventions stated above, I reevaluated the patient and found patient to be resting comfortably and hemodynamically stable Admission and further testing considered, patient's care signed out to Dr. Elnor to follow-up on final reading of CTs.  If negative and patient can safely ambulate he can be discharged to follow-up outpatient with his treatment team.  Will cover with antibiotics for possible infection.  Urine sent for culture      Final diagnoses:  Fall, initial encounter  Contusion of left hip, initial encounter  Lower urinary tract infectious disease    ED Discharge Orders          Ordered    HYDROcodone -acetaminophen  (NORCO) 10-325 MG tablet  Every 6 hours PRN        08/06/24 1507    cephALEXin (KEFLEX) 500 MG capsule  4 times daily        08/06/24 1507               Towana Ozell BROCKS, MD 08/06/24 1659

## 2024-08-06 NOTE — ED Provider Notes (Signed)
  Provider Note MRN:  [id number]  Arrival date & time: 08/06/24    ED Course and Medical Decision Making  Assumed care from Dr Towana at shift change.  See note from prior team for complete details, in brief:  Clinical Course as of 08/06/24 1731  Fri Aug 06, 2024  1249 Patient's son is here and able to give a little bit more history.  He is getting therapy for left hip pain after fall.  Had a new fall yesterday and reinjured left hip and possibly chest.  Also is been complaining of some dysuria.  They had x-rays at PCP that did not show anything but they wanted to come here to make sure. [MB]  1511 Handoff MCB 78 yo/m  Here w/ fall +xarelto  Abx for UTI, wbc chronic elev, no fever CT pending  [SG]    Clinical Course User Index [MB] Towana Ozell BROCKS, MD [SG] Elnor Jayson LABOR, DO     5:31 PM: Imaging reviewed, this is stable.  He is feeling better, he is ambulatory at his typical level.  He is currently being treated for UTI and his PCP can reports symptoms are improved.  Will trial home health PT, patient is not interested in inpatient PT. He is eager for discharge. Encourage patient uses walker at home.  Family will stay with patient over the next couple days.  I have discussed the diagnosis/risks/treatment options with the patient and family.  Evaluation and diagnostic testing in the emergency department does not suggest an emergent condition requiring admission or immediate intervention beyond what has been performed at this time.  They will follow up with PCP. We also discussed returning to the ED immediately if new or worsening sx occur. We discussed the sx which are most concerning (e.g., sudden worsening pain, fever, inability to tolerate by mouth fall, head injury) that necessitate immediate return.    The patient appears reasonably screen and/or stabilized for discharge and I doubt any other medical condition or other [facility] requiring further screening, evaluation, or treatment in the ED  at this time prior to discharge.       Procedures  Final Clinical Impressions(s) / ED Diagnoses     ICD-10-CM   1. Fall, initial encounter  W19.XXXA     2. Contusion of left hip, initial encounter  S70.02XA     3. Lower urinary tract infectious disease  N39.0       ED Discharge Orders          Ordered    HYDROcodone -acetaminophen  (NORCO) 10-325 MG tablet  Every 6 hours PRN        08/06/24 1507    cephALEXin (KEFLEX) 500 MG capsule  4 times daily        08/06/24 1507              Discharge Instructions      It was a pleasure caring for you today in the emergency department.  Please follow-up with your PCP.  Please use your walker when ambulating at home.  Please return to the emergency department for any worsening or worrisome symptoms.        Elnor Jayson LABOR, DO 08/06/24 1731

## 2024-08-06 NOTE — ED Triage Notes (Signed)
 Pt arrives via [facility] from home. Pt had a fall yesterday was seen at PCP had XRays. EMS unable to answer if patient takes a blood thinner but family denied patient hitting head or having LOC. Pt states that his crutch missed the curb and that is what called him to fall. Pt reports increased pain and weakness today, pain to left flank and back/hip. Family also questions UTI with EMS. VS with EMS 127/69, 85 HR, 97% RA, CBG 126, 98.5 temp.

## 2024-08-08 LAB — URINE CULTURE: Culture: >=100000 — AB

## 2024-08-09 ENCOUNTER — Telehealth (HOSPITAL_BASED_OUTPATIENT_CLINIC_OR_DEPARTMENT_OTHER): Payer: Self-pay

## 2024-08-09 DIAGNOSIS — N3001 Acute cystitis with hematuria: Secondary | ICD-10-CM | POA: Diagnosis not present

## 2024-08-09 DIAGNOSIS — Z6820 Body mass index (BMI) 20.0-20.9, adult: Secondary | ICD-10-CM | POA: Diagnosis not present

## 2024-08-09 NOTE — Telephone Encounter (Signed)
 Post ED Visit - Positive Culture Follow-up  Culture report reviewed by antimicrobial stewardship pharmacist: [facility] [x]  Carmelita Rocher, Pharm.D. []  Venetia Gully, Pharm.D., BCPS AQ-ID []  Garrel Crews, Pharm.D., BCPS []  Almarie Lunger, [address].D., BCPS []  [place], [address].D., BCPS, AAHIVP []  Rosaline Bihari, Pharm.D., BCPS, AAHIVP []  Vernell Meier, PharmD, BCPS []  Latanya Hint, PharmD, BCPS []  Donald Medley, PharmD, BCPS []  Rocky Bold, PharmD []  Dorothyann Alert, PharmD, BCPS []  Morene Babe, PharmD  [facility] []  Rosaline Edison, PharmD []  Romona Bliss, PharmD []  Dolphus Roller, PharmD []  Veva Seip, Rph []  Vernell Daunt) Leonce, PharmD []  Eva Allis, PharmD []  Rosaline Millet, PharmD []  Iantha Batch, PharmD []  Arvin Gauss, PharmD []  Wanda Hasting, PharmD []  Ronal Rav, PharmD []  Rocky Slade, PharmD []  Bard Jeans, PharmD   Positive urine culture Treated with Cephalexin, organism sensitive to the same and no further patient follow-up is required at this time.  Dan [name] 08/09/2024, 10:22 AM

## 2024-08-10 ENCOUNTER — Encounter: Payer: Self-pay | Admitting: Cardiology

## 2024-08-10 ENCOUNTER — Encounter: Payer: Self-pay | Admitting: Oncology

## 2024-08-10 ENCOUNTER — Ambulatory Visit: Admitting: Physical Therapy

## 2024-08-11 NOTE — Telephone Encounter (Signed)
 He had not been on blood thinners from our prior visits due to a prior GI bleed and also he had declined to retry. Looks like was given some xarelto  short term around the time of a recent orthopedic surgery. We have not had him on anticoagulation. Aspirin is not very effective at preventing stroke from afib. 10 months since he was last seen, would need to go through history and reevaluate if antiocoagulatoin may be an option  JINNY Ross MD

## 2024-08-13 ENCOUNTER — Encounter: Admitting: Physical Therapy

## 2024-08-13 ENCOUNTER — Encounter: Payer: Self-pay | Admitting: Oncology

## 2024-08-15 ENCOUNTER — Emergency Department (HOSPITAL_COMMUNITY)

## 2024-08-15 ENCOUNTER — Other Ambulatory Visit: Payer: Self-pay

## 2024-08-15 ENCOUNTER — Inpatient Hospital Stay (HOSPITAL_COMMUNITY)
Admission: EM | Admit: 2024-08-15 | Discharge: 2024-08-18 | DRG: 682 | Disposition: A | Discharge status: Home Health | Attending: Family Medicine | Admitting: Family Medicine

## 2024-08-15 DIAGNOSIS — G9341 Metabolic encephalopathy: Secondary | ICD-10-CM | POA: Diagnosis present

## 2024-08-15 DIAGNOSIS — I7143 Infrarenal abdominal aortic aneurysm, without rupture: Secondary | ICD-10-CM | POA: Diagnosis not present

## 2024-08-15 DIAGNOSIS — K567 Ileus, unspecified: Secondary | ICD-10-CM | POA: Diagnosis present

## 2024-08-15 DIAGNOSIS — Z7952 Long term (current) use of systemic steroids: Secondary | ICD-10-CM

## 2024-08-15 DIAGNOSIS — F419 Anxiety disorder, unspecified: Secondary | ICD-10-CM | POA: Diagnosis present

## 2024-08-15 DIAGNOSIS — E860 Dehydration: Secondary | ICD-10-CM | POA: Diagnosis present

## 2024-08-15 DIAGNOSIS — E871 Hypo-osmolality and hyponatremia: Secondary | ICD-10-CM | POA: Diagnosis present

## 2024-08-15 DIAGNOSIS — J4489 Other specified chronic obstructive pulmonary disease: Secondary | ICD-10-CM | POA: Diagnosis present

## 2024-08-15 DIAGNOSIS — Z87440 Personal history of urinary (tract) infections: Secondary | ICD-10-CM

## 2024-08-15 DIAGNOSIS — Z8249 Family history of ischemic heart disease and other diseases of the circulatory system: Secondary | ICD-10-CM

## 2024-08-15 DIAGNOSIS — R531 Weakness: Principal | ICD-10-CM

## 2024-08-15 DIAGNOSIS — F32A Depression, unspecified: Secondary | ICD-10-CM | POA: Diagnosis present

## 2024-08-15 DIAGNOSIS — I129 Hypertensive chronic kidney disease with stage 1 through stage 4 chronic kidney disease, or unspecified chronic kidney disease: Secondary | ICD-10-CM | POA: Diagnosis present

## 2024-08-15 DIAGNOSIS — Z9079 Acquired absence of other genital organ(s): Secondary | ICD-10-CM

## 2024-08-15 DIAGNOSIS — M1990 Unspecified osteoarthritis, unspecified site: Secondary | ICD-10-CM | POA: Diagnosis present

## 2024-08-15 DIAGNOSIS — M109 Gout, unspecified: Secondary | ICD-10-CM | POA: Diagnosis present

## 2024-08-15 DIAGNOSIS — N400 Enlarged prostate without lower urinary tract symptoms: Secondary | ICD-10-CM | POA: Diagnosis present

## 2024-08-15 DIAGNOSIS — K219 Gastro-esophageal reflux disease without esophagitis: Secondary | ICD-10-CM | POA: Diagnosis present

## 2024-08-15 DIAGNOSIS — Z79899 Other long term (current) drug therapy: Secondary | ICD-10-CM

## 2024-08-15 DIAGNOSIS — R109 Unspecified abdominal pain: Secondary | ICD-10-CM | POA: Diagnosis not present

## 2024-08-15 DIAGNOSIS — Z7901 Long term (current) use of anticoagulants: Secondary | ICD-10-CM

## 2024-08-15 DIAGNOSIS — Z888 Allergy status to other drugs, medicaments and biological substances status: Secondary | ICD-10-CM

## 2024-08-15 DIAGNOSIS — R059 Cough, unspecified: Secondary | ICD-10-CM | POA: Diagnosis not present

## 2024-08-15 DIAGNOSIS — R1084 Generalized abdominal pain: Secondary | ICD-10-CM | POA: Diagnosis not present

## 2024-08-15 DIAGNOSIS — N179 Acute kidney failure, unspecified: Principal | ICD-10-CM | POA: Diagnosis present

## 2024-08-15 DIAGNOSIS — Z1152 Encounter for screening for COVID-19: Secondary | ICD-10-CM

## 2024-08-15 DIAGNOSIS — E785 Hyperlipidemia, unspecified: Secondary | ICD-10-CM | POA: Diagnosis present

## 2024-08-15 DIAGNOSIS — N182 Chronic kidney disease, stage 2 (mild): Secondary | ICD-10-CM | POA: Diagnosis present

## 2024-08-15 DIAGNOSIS — R630 Anorexia: Secondary | ICD-10-CM | POA: Diagnosis present

## 2024-08-15 DIAGNOSIS — F17210 Nicotine dependence, cigarettes, uncomplicated: Secondary | ICD-10-CM | POA: Diagnosis present

## 2024-08-15 DIAGNOSIS — Z9181 History of falling: Secondary | ICD-10-CM

## 2024-08-15 DIAGNOSIS — Z8546 Personal history of malignant neoplasm of prostate: Secondary | ICD-10-CM

## 2024-08-15 DIAGNOSIS — N2889 Other specified disorders of kidney and ureter: Secondary | ICD-10-CM | POA: Diagnosis not present

## 2024-08-15 DIAGNOSIS — Z96643 Presence of artificial hip joint, bilateral: Secondary | ICD-10-CM | POA: Diagnosis present

## 2024-08-15 DIAGNOSIS — Z6820 Body mass index (BMI) 20.0-20.9, adult: Secondary | ICD-10-CM

## 2024-08-15 DIAGNOSIS — I480 Paroxysmal atrial fibrillation: Secondary | ICD-10-CM | POA: Diagnosis present

## 2024-08-15 LAB — CBC WITH DIFFERENTIAL/PLATELET
Abs Immature Granulocytes: 0.36 K/uL — ABNORMAL HIGH (ref 0.00–0.07)
Basophils Absolute: 0.1 K/uL (ref 0.0–0.1)
Basophils Relative: 1 %
Eosinophils Absolute: 0.2 K/uL (ref 0.0–0.5)
Eosinophils Relative: 1 %
HCT: 29.9 % — ABNORMAL LOW (ref 39.0–52.0)
Hemoglobin: 10 g/dL — ABNORMAL LOW (ref 13.0–17.0)
Immature Granulocytes: 3 %
Lymphocytes Relative: 11 %
Lymphs Abs: 1.5 K/uL (ref 0.7–4.0)
MCH: 29.7 pg (ref 26.0–34.0)
MCHC: 33.4 g/dL (ref 30.0–36.0)
MCV: 88.7 fL (ref 80.0–100.0)
Monocytes Absolute: 0.8 K/uL (ref 0.1–1.0)
Monocytes Relative: 6 %
Neutro Abs: 11.1 K/uL — ABNORMAL HIGH (ref 1.7–7.7)
Neutrophils Relative %: 78 %
Platelets: 641 K/uL — ABNORMAL HIGH (ref 150–400)
RBC: 3.37 MIL/uL — ABNORMAL LOW (ref 4.22–5.81)
RDW: 16.4 % — ABNORMAL HIGH (ref 11.5–15.5)
WBC: 14.1 K/uL — ABNORMAL HIGH (ref 4.0–10.5)
nRBC: 0.1 % (ref 0.0–0.2)

## 2024-08-15 LAB — TSH: TSH: 2.75 u[IU]/mL (ref 0.350–4.500)

## 2024-08-15 LAB — URINALYSIS, ROUTINE W REFLEX MICROSCOPIC
Bilirubin Urine: NEGATIVE
Glucose, UA: NEGATIVE mg/dL
Hgb urine dipstick: NEGATIVE
Ketones, ur: NEGATIVE mg/dL
Leukocytes,Ua: NEGATIVE
Nitrite: NEGATIVE
Protein, ur: NEGATIVE mg/dL
Specific Gravity, Urine: 1.011 (ref 1.005–1.030)
pH: 6 (ref 5.0–8.0)

## 2024-08-15 LAB — VITAMIN B12: Vitamin B-12: 266 pg/mL (ref 180–914)

## 2024-08-15 LAB — COMPREHENSIVE METABOLIC PANEL WITH GFR
ALT: 11 U/L (ref 0–44)
AST: 15 U/L (ref 15–41)
Albumin: 4.2 g/dL (ref 3.5–5.0)
Alkaline Phosphatase: 89 U/L (ref 38–126)
Anion gap: 15 (ref 5–15)
BUN: 38 mg/dL — ABNORMAL HIGH (ref 8–23)
CO2: 24 mmol/L (ref 22–32)
Calcium: 11.6 mg/dL — ABNORMAL HIGH (ref 8.9–10.3)
Chloride: 88 mmol/L — ABNORMAL LOW (ref 98–111)
Creatinine, Ser: 2.19 mg/dL — ABNORMAL HIGH (ref 0.61–1.24)
GFR, Estimated: 30 mL/min — ABNORMAL LOW (ref >60)
Glucose, Bld: 104 mg/dL — ABNORMAL HIGH (ref 70–99)
Potassium: 4.1 mmol/L (ref 3.5–5.1)
Sodium: 127 mmol/L — ABNORMAL LOW (ref 135–145)
Total Bilirubin: 0.4 mg/dL (ref 0.0–1.2)
Total Protein: 6.9 g/dL (ref 6.5–8.1)

## 2024-08-15 LAB — TROPONIN T, HIGH SENSITIVITY
Troponin T High Sensitivity: 115 ng/L (ref 0–19)
Troponin T High Sensitivity: 98 ng/L — ABNORMAL HIGH (ref 0–19)

## 2024-08-15 LAB — RESP PANEL BY RT-PCR (RSV, FLU A&B, COVID)  RVPGX2
Influenza A by PCR: NEGATIVE
Influenza B by PCR: NEGATIVE
Resp Syncytial Virus by PCR: NEGATIVE
SARS Coronavirus 2 by RT PCR: NEGATIVE

## 2024-08-15 LAB — ETHANOL: Alcohol, Ethyl (B): <15 mg/dL (ref <15)

## 2024-08-15 LAB — PHOSPHORUS: Phosphorus: 3.3 mg/dL (ref 2.5–4.6)

## 2024-08-15 LAB — MAGNESIUM
Magnesium: 2.2 mg/dL (ref 1.7–2.4)
Magnesium: 2 mg/dL (ref 1.7–2.4)

## 2024-08-15 LAB — AMMONIA: Ammonia: 23 umol/L (ref 9–35)

## 2024-08-15 LAB — LIPASE, BLOOD: Lipase: 42 U/L (ref 11–51)

## 2024-08-15 MED ORDER — SODIUM CHLORIDE 0.9 % IV BOLUS
1000.0000 mL | Freq: Once | INTRAVENOUS | Status: AC
  Administered 2024-08-15: 1000 mL via INTRAVENOUS

## 2024-08-15 MED ORDER — ALUM & MAG HYDROXIDE-SIMETH 200-200-20 MG/5ML PO SUSP
15.0000 mL | ORAL | Status: DC | PRN
  Administered 2024-08-15: 15 mL via ORAL
  Filled 2024-08-15: qty 30

## 2024-08-15 MED ORDER — SUCRALFATE 1 GM/10ML PO SUSP
1.0000 g | Freq: Three times a day (TID) | ORAL | Status: DC
  Administered 2024-08-15 – 2024-08-16 (×5): 1 g via ORAL
  Filled 2024-08-15 (×7): qty 10

## 2024-08-15 MED ORDER — ACETAMINOPHEN 325 MG PO TABS: 650.0000 mg | ORAL_TABLET | Freq: Four times a day (QID) | ORAL | Status: DC | PRN

## 2024-08-15 MED ORDER — BUDESON-GLYCOPYRROL-FORMOTEROL 160-9-4.8 MCG/ACT IN AERO
2.0000 | INHALATION_SPRAY | Freq: Two times a day (BID) | RESPIRATORY_TRACT | Status: DC
  Administered 2024-08-15 – 2024-08-16 (×2): 2 via RESPIRATORY_TRACT
  Filled 2024-08-15 (×2): qty 5.9

## 2024-08-15 MED ORDER — DILTIAZEM HCL ER COATED BEADS 180 MG PO CP24
360.0000 mg | ORAL_CAPSULE | Freq: Every day | ORAL | Status: DC
  Administered 2024-08-16 – 2024-08-18 (×3): 360 mg via ORAL
  Filled 2024-08-15 (×3): qty 2

## 2024-08-15 MED ORDER — PREDNISONE 5 MG PO TABS
5.0000 mg | ORAL_TABLET | Freq: Every day | ORAL | Status: DC
Start: 2024-08-16 — End: 2024-08-18
  Administered 2024-08-16 – 2024-08-18 (×3): 5 mg via ORAL
  Filled 2024-08-15 (×3): qty 1

## 2024-08-15 MED ORDER — MEGESTROL ACETATE 40 MG/ML PO SUSP: 400.0000 mg | Freq: Every day | ORAL | Status: DC | PRN

## 2024-08-15 MED ORDER — NICOTINE 21 MG/24HR TD PT24
21.0000 mg | MEDICATED_PATCH | Freq: Every day | TRANSDERMAL | Status: DC
  Filled 2024-08-15 (×3): qty 1

## 2024-08-15 MED ORDER — SODIUM CHLORIDE 0.9 % IV SOLN
30.0000 mg | Freq: Once | INTRAVENOUS | Status: AC
  Administered 2024-08-15: 30 mg via INTRAVENOUS
  Filled 2024-08-15: qty 10

## 2024-08-15 MED ORDER — ALLOPURINOL 100 MG PO TABS
100.0000 mg | ORAL_TABLET | Freq: Every day | ORAL | Status: DC
  Administered 2024-08-16 – 2024-08-18 (×3): 100 mg via ORAL
  Filled 2024-08-15 (×3): qty 1

## 2024-08-15 MED ORDER — HEPARIN SODIUM (PORCINE) 5000 UNIT/ML IJ SOLN
5000.0000 [IU] | Freq: Three times a day (TID) | INTRAMUSCULAR | Status: DC
  Administered 2024-08-15 – 2024-08-18 (×8): 5000 [IU] via SUBCUTANEOUS
  Filled 2024-08-15 (×8): qty 1

## 2024-08-15 MED ORDER — MORPHINE SULFATE (PF) 4 MG/ML IV SOLN
4.0000 mg | Freq: Once | INTRAVENOUS | Status: AC
  Administered 2024-08-15: 4 mg via INTRAVENOUS
  Filled 2024-08-15: qty 1

## 2024-08-15 MED ORDER — VITAMIN C 500 MG PO TABS
1000.0000 mg | ORAL_TABLET | Freq: Every day | ORAL | Status: DC
  Administered 2024-08-16: 1000 mg via ORAL
  Filled 2024-08-15: qty 2

## 2024-08-15 MED ORDER — SODIUM CHLORIDE 0.9 % IV SOLN: INTRAVENOUS | Status: DC

## 2024-08-15 MED ORDER — ENSURE PLUS HIGH PROTEIN PO LIQD
237.0000 mL | Freq: Two times a day (BID) | ORAL | Status: DC
  Administered 2024-08-16 – 2024-08-18 (×4): 237 mL via ORAL

## 2024-08-15 MED ORDER — ACETAMINOPHEN 650 MG RE SUPP: 650.0000 mg | Freq: Four times a day (QID) | RECTAL | Status: DC | PRN

## 2024-08-15 MED ORDER — ONDANSETRON HCL 4 MG PO TABS: 4.0000 mg | ORAL_TABLET | Freq: Four times a day (QID) | ORAL | Status: DC | PRN

## 2024-08-15 MED ORDER — PANTOPRAZOLE SODIUM 40 MG PO TBEC
40.0000 mg | DELAYED_RELEASE_TABLET | Freq: Every day | ORAL | Status: DC
  Administered 2024-08-16 – 2024-08-18 (×3): 40 mg via ORAL
  Filled 2024-08-15 (×3): qty 1

## 2024-08-15 MED ORDER — ALUM & MAG HYDROXIDE-SIMETH 200-200-20 MG/5ML PO SUSP
30.0000 mL | Freq: Once | ORAL | Status: AC
  Administered 2024-08-15: 30 mL via ORAL
  Filled 2024-08-15: qty 30

## 2024-08-15 MED ORDER — POLYETHYLENE GLYCOL 3350 17 G PO PACK
17.0000 g | PACK | Freq: Two times a day (BID) | ORAL | Status: DC
  Administered 2024-08-15 – 2024-08-16 (×4): 17 g via ORAL
  Filled 2024-08-15 (×5): qty 1

## 2024-08-15 MED ORDER — ONDANSETRON HCL 4 MG/2ML IJ SOLN
4.0000 mg | Freq: Four times a day (QID) | INTRAMUSCULAR | Status: DC | PRN
  Administered 2024-08-16: 4 mg via INTRAVENOUS
  Filled 2024-08-15 (×2): qty 2

## 2024-08-15 MED ORDER — HYDROMORPHONE HCL 1 MG/ML IJ SOLN: 0.5000 mg | INTRAMUSCULAR | Status: DC | PRN

## 2024-08-15 MED ORDER — ZINC SULFATE 220 (50 ZN) MG PO CAPS
220.0000 mg | ORAL_CAPSULE | Freq: Every day | ORAL | Status: DC
  Administered 2024-08-16: 220 mg via ORAL
  Filled 2024-08-15: qty 1

## 2024-08-15 NOTE — ED Notes (Signed)
 EDP at [facility]

## 2024-08-15 NOTE — ED Provider Notes (Signed)
 [place] EMERGENCY DEPARTMENT AT [facility] Provider Note   CSN: [id number] Arrival date & time: 08/15/24  1005     Patient presents with: Abdominal Pain and Weakness   Dan [name] is a 78 y.o. male.  He is brought in by ambulance from home for various complaints.  He said he has got a nonproductive cough and feels a little short of breath.  He has got generalized abdominal pain that is been going on for few days.  Is been constipated and does not have much of an appetite.  He thinks he is on antibiotics for urinary tract infection.  Also has had recent falls and has not really been able to ambulate in a few weeks.  General weakness.  Lives at home with his wife.  Denies any fevers or chills.   The history is provided by the patient.  Abdominal Pain Pain location:  Generalized Pain severity:  Moderate Onset quality:  Gradual Duration:  4 days Timing:  Intermittent Progression:  Unchanged Chronicity:  New Relieved by:  Nothing Associated symptoms: constipation, cough, fatigue and shortness of breath   Associated symptoms: no chest pain, no chills, no diarrhea, no dysuria, no fever, no hematemesis, no hematochezia, no hematuria, no nausea and no vomiting   Weakness Associated symptoms: abdominal pain, cough and shortness of breath   Associated symptoms: no chest pain, no diarrhea, no dysuria, no fever, no hematochezia, no nausea and no vomiting        Prior to Admission medications   Medication Sig Start Date End Date Taking? Authorizing Provider  abiraterone  acetate (ZYTIGA ) 250 MG tablet Take 4 tablets (1,000 mg total) by mouth daily. Take on an empty stomach 1 hour before or 2 hours after a meal 11/26/23   Katragadda, Sreedhar, MD  allopurinol  (ZYLOPRIM ) 100 MG tablet Take 100 mg by mouth daily. 01/30/21   [provider]  Ascorbic Acid (VITAMIN C) 1000 MG tablet Take 1,000 mg by mouth daily.    [provider]   budesonide-glycopyrrolate-formoterol (BREZTRI  AEROSPHERE) 160-9-4.8 MCG/ACT AERO inhaler Inhale 2 puffs into the lungs in the morning and at bedtime. Patient not taking: Reported on 06/28/2024 05/17/24   Wert, Shawnn Bouillon B, MD  cephALEXin (KEFLEX) 500 MG capsule Take 1 capsule (500 mg total) by mouth 4 (four) times daily. 08/06/24   Towana Ozell BROCKS, MD  cholecalciferol (VITAMIN D3) 25 MCG (1000 UNIT) tablet Take 1,000 Units by mouth daily.    [provider]  diltiazem  (CARDIZEM  CD) 360 MG 24 hr capsule Take 360 mg by mouth daily. 03/14/22   [provider]  HYDROcodone -acetaminophen  (NORCO) 10-325 MG tablet Take 1 tablet by mouth every 6 (six) hours as needed. 08/06/24   Towana Ozell BROCKS, MD  losartan  (COZAAR ) 25 MG tablet TAKE 1 TABLET (25 MG TOTAL) BY MOUTH DAILY. 07/27/24   Alvan Dorn FALCON, MD  magnesium  oxide (MAG-OX) 400 (240 Mg) MG tablet TAKE 1 TABLET BY MOUTH EVERY DAY 06/10/23   Rogers Hai, MD  megestrol  (MEGACE ) 40 MG/ML suspension Take 400 mg by mouth daily as needed (appetite). 12/29/22   [provider]  methocarbamol  (ROBAXIN ) 500 MG tablet Take 1 tablet (500 mg total) by mouth every 6 (six) hours as needed for muscle spasms. 01/21/24   Patti Rosina SAUNDERS, PA-C  methylphenidate  (RITALIN ) 5 MG tablet Take 1 tablet (5 mg total) by mouth 2 (two) times daily as needed. Take 1 tab with breakfast and 1 tab with lunch as needed 08/02/24  Kandala, Hyndavi, MD  mirtazapine (REMERON) 30 MG tablet Take 30 mg by mouth at bedtime. Patient not taking: Reported on 06/28/2024 03/12/24   [provider]  polyethylene glycol (MIRALAX  / GLYCOLAX ) 17 g packet Take 17 g by mouth 2 (two) times daily. Patient not taking: Reported on 06/28/2024 01/21/24   Patti Rosina SAUNDERS, PA-C  potassium chloride  (MICRO-K ) 10 MEQ CR capsule TAKE 2 CAPSULES BY MOUTH DAILY. 09/06/20   Richarda Prentice LITTIE Mickey., NP  predniSONE  (DELTASONE ) 5 MG tablet Take 1 tablet (5 mg total) by mouth daily with  breakfast. 08/18/23   Rogers Hai, MD  rivaroxaban  (XARELTO ) 10 MG TABS tablet Take 1 tablet (10 mg total) by mouth daily with breakfast for 21 days. Patient not taking: Reported on 06/28/2024 01/21/24 05/06/24  Patti Rosina SAUNDERS, PA-C  TRELEGY ELLIPTA 200-62.5-25 MCG/ACT AEPB Inhale 1 puff into the lungs daily. 01/21/22   [provider]  Zinc 20 MG CAPS Take 40 mg by mouth daily.    [provider]    Allergies: Tamsulosin    Review of Systems  Constitutional:  Positive for fatigue. Negative for chills and fever.  Respiratory:  Positive for cough and shortness of breath.   Cardiovascular:  Negative for chest pain.  Gastrointestinal:  Positive for abdominal pain and constipation. Negative for diarrhea, hematemesis, hematochezia, nausea and vomiting.  Genitourinary:  Negative for dysuria and hematuria.  Neurological:  Positive for weakness.    Updated Vital Signs BP 118/66   Pulse 75   Temp 98.1 F (36.7 C) (Oral)   Resp (!) 26   Ht 5' 10 (1.778 m)   Wt 63.5 kg   SpO2 97%   BMI 20.09 kg/m   Physical Exam Vitals and nursing note reviewed.  Constitutional:      Appearance: Normal appearance. He is well-developed.  HENT:     Head: Normocephalic and atraumatic.  Eyes:     Conjunctiva/sclera: Conjunctivae normal.  Cardiovascular:     Rate and Rhythm: Normal rate and regular rhythm.     Heart sounds: No murmur heard. Pulmonary:     Effort: Pulmonary effort is normal. No respiratory distress.     Breath sounds: Normal breath sounds.  Abdominal:     General: There is distension.     Tenderness: There is generalized abdominal tenderness. There is no guarding or rebound.  Musculoskeletal:        General: No deformity.     Cervical back: Neck supple.  Skin:    General: Skin is warm and dry.  Neurological:     General: No focal deficit present.     Mental Status: He is alert.     GCS: GCS eye subscore is 4. GCS verbal subscore is 5. GCS motor subscore  is 6.     Motor: No weakness.     (all labs ordered are listed, but only abnormal results are displayed) Labs Reviewed  COMPREHENSIVE METABOLIC PANEL WITH GFR - Abnormal; Notable for the following components:      Result Value   Sodium 127 (*)    Chloride 88 (*)    Glucose, Bld 104 (*)    BUN 38 (*)    Creatinine, Ser 2.19 (*)    Calcium 11.6 (*)    GFR, Estimated 30 (*)    All other components within normal limits  CBC WITH DIFFERENTIAL/PLATELET - Abnormal; Notable for the following components:   WBC 14.1 (*)    RBC 3.37 (*)    Hemoglobin 10.0 (*)  HCT 29.9 (*)    RDW 16.4 (*)    Platelets 641 (*)    Neutro Abs 11.1 (*)    Abs Immature Granulocytes 0.36 (*)    All other components within normal limits  TROPONIN T, HIGH SENSITIVITY - Abnormal; Notable for the following components:   Troponin T High Sensitivity 115 (*)    All other components within normal limits  TROPONIN T, HIGH SENSITIVITY - Abnormal; Notable for the following components:   Troponin T High Sensitivity 98 (*)    All other components within normal limits  RESP PANEL BY RT-PCR (RSV, FLU A&B, COVID)  RVPGX2  URINALYSIS, ROUTINE W REFLEX MICROSCOPIC  LIPASE, BLOOD  ETHANOL  MAGNESIUM   PHOSPHORUS  MAGNESIUM   TSH    EKG: EKG Interpretation Date/Time:  Sunday August 15 2024 10:19:30 EDT Ventricular Rate:  91 PR Interval:  180 QRS Duration:  102 QT Interval:  337 QTC Calculation: 415 R Axis:   83  Text Interpretation: Sinus tachycardia vs atrial fib Atrial premature complexes Borderline right axis deviation Borderline low voltage, extremity leads No significant change since last tracing Confirmed by Towana Sharper [phone]) on 08/15/2024 10:35:49 AM  Radiology: CT Renal Stone Study Result Date: 08/15/2024 CLINICAL DATA:  Abdominal/flank pain, stone suspected Abd/flank pain, stone suspected BIB [facility] from home for complaint of Abdominal pain, and generalized weakness. Informed EMS he was seen on  10/3 and dx with a UTI and taking medication for it. Hx of Afib, COPD, and prostrated Cancer EXAM: CT ABDOMEN AND PELVIS WITHOUT CONTRAST TECHNIQUE: Multidetector CT imaging of the abdomen and pelvis was performed following the standard protocol without IV contrast. RADIATION DOSE REDUCTION: This exam was performed according to the departmental dose-optimization program which includes automated exposure control, adjustment of the mA and/or kV according to patient size and/or use of iterative reconstruction technique. COMPARISON:  X-ray lumbar spine 07/28/2024 FINDINGS: Lower chest: Bronchial wall thickening.  No acute abnormality. Hepatobiliary: No focal liver abnormality. No gallstones, gallbladder wall thickening, or pericholecystic fluid. No biliary dilatation. Pancreas: No focal lesion. Normal pancreatic contour. No surrounding inflammatory changes. No main pancreatic ductal dilatation. Spleen: Normal in size without focal abnormality. Adrenals/Urinary Tract: No adrenal nodule bilaterally. No nephrolithiasis and no hydronephrosis. Peripherally calcified 6 cm right renal fluid lesion. No ureterolithiasis or hydroureter. The urinary bladder is unremarkable. Stomach/Bowel: Stomach is distended with fluid. No evidence of bowel wall thickening or dilatation. Diffuse small bowel dilatation with fluid measuring up to 4 cm in caliber. No transition point identified. Stool throughout the cecum and ascending colon, gaseous distension of the transverse colon with the remainder of the colon decompressed. Appendix appears normal. Vascular/Lymphatic: Borderline enlarged infrarenal abdominal aorta measuring up to 3 cm. Severe atherosclerotic plaque of the aorta and its branches. No abdominal, pelvic, or inguinal lymphadenopathy. Reproductive: Prostate is not visualized limited evaluation of the pelvis due to streak artifact originating from bilateral femoral surgical hardware. Other: Nonspecific trace volume intraperitoneal  simple free pelvic fluid. No intraperitoneal free gas. No organized fluid collection. Musculoskeletal: No abdominal wall hernia or abnormality. No suspicious lytic or blastic osseous lesions. No acute displaced fracture. Multilevel severe degenerative changes of the spine. Chronic grossly stable L1 compression fracture. Intervertebral disc space vacuum phenomenon at the L4-L5 L5-S1 levels with associated endplate sclerosis. Mild retrolisthesis of L4 on L5. Bilateral hip total arthroplasty. IMPRESSION: 1. Diffuse small bowel dilatation with fluid measuring up to 4 cm. No definite transition point. Question developing/early small bowel obstruction. Consider small-bowel follow-through with PO contrast  administration to evaluate migration of the PO contrast. 2. Aneurysmal infrarenal abdominal aorta (3 cm). Recommend follow-up ultrasound every 3 years. (Ref.: J Vasc Surg. 2018; 67:2-77 and J Am Coll Radiol 2013;10(10):789-794.) 3.  Aortic Atherosclerosis (ICD10-I70.0). Electronically Signed   By: Morgane  Naveau M.D.   On: 08/15/2024 12:22   CT Head Wo Contrast Result Date: 08/15/2024 CLINICAL DATA:  Mental status change, unknown cause EXAM: CT HEAD WITHOUT CONTRAST TECHNIQUE: Contiguous axial images were obtained from the base of the skull through the vertex without intravenous contrast. RADIATION DOSE REDUCTION: This exam was performed according to the departmental dose-optimization program which includes automated exposure control, adjustment of the mA and/or kV according to patient size and/or use of iterative reconstruction technique. COMPARISON:  CT head 08/06/2024 FINDINGS: Brain: Cerebral ventricle sizes are concordant with the degree of cerebral volume loss. Patchy and confluent areas of decreased attenuation are noted throughout the deep and periventricular white matter of the cerebral hemispheres bilaterally, compatible with chronic microvascular ischemic disease. No evidence of large-territorial acute  infarction. No parenchymal hemorrhage. No mass lesion. No extra-axial collection. No mass effect or midline shift. No hydrocephalus. Basilar cisterns are patent. Vascular: No hyperdense vessel. Skull: No acute fracture or focal lesion. Sinuses/Orbits: Paranasal sinuses and mastoid air cells are clear. Right lens replacement. Otherwise the orbits are unremarkable. Other: None. IMPRESSION: No acute intracranial abnormality. Electronically Signed   By: Morgane  Naveau M.D.   On: 08/15/2024 12:06   DG Chest Port 1 View Result Date: 08/15/2024 CLINICAL DATA:  Cough EXAM: PORTABLE CHEST 1 VIEW COMPARISON:  Chest radiograph dated 08/06/2024 FINDINGS: Normal lung volumes. No focal consolidations. No pleural effusion or pneumothorax. The heart size and mediastinal contours are within normal limits. No acute osseous abnormality. IMPRESSION: No active disease. Electronically Signed   By: Limin  Xu M.D.   On: 08/15/2024 11:26     Procedures   Medications Ordered in the ED  0.9 %  sodium chloride  infusion (has no administration in time range)  polyethylene glycol (MIRALAX  / GLYCOLAX ) packet 17 g (17 g Oral Given 08/15/24 1527)  megestrol  (MEGACE ) 40 MG/ML suspension 400 mg (has no administration in time range)  diltiazem  (CARDIZEM  CD) 24 hr capsule 360 mg (has no administration in time range)  budesonide-glycopyrrolate-formoterol (BREZTRI ) 160-9-4.8 MCG/ACT inhaler 2 puff (has no administration in time range)  ascorbic acid (VITAMIN C) tablet 1,000 mg (has no administration in time range)  allopurinol  (ZYLOPRIM ) tablet 100 mg (has no administration in time range)  predniSONE  (DELTASONE ) tablet 5 mg (has no administration in time range)  zinc sulfate (50mg  elemental zinc) capsule 220 mg (has no administration in time range)  pantoprazole (PROTONIX) EC tablet 40 mg (has no administration in time range)  pamidronate (AREDIA) 30 mg in sodium chloride  0.9 % 500 mL IVPB (30 mg Intravenous New Bag/Given 08/15/24  1511)  heparin injection 5,000 Units (has no administration in time range)  acetaminophen  (TYLENOL ) tablet 650 mg (has no administration in time range)    Or  acetaminophen  (TYLENOL ) suppository 650 mg (has no administration in time range)  HYDROmorphone  (DILAUDID ) injection 0.5-1 mg (has no administration in time range)  ondansetron  (ZOFRAN ) tablet 4 mg (has no administration in time range)    Or  ondansetron  (ZOFRAN ) injection 4 mg (has no administration in time range)  sucralfate (CARAFATE) 1 GM/10ML suspension 1 g (1 g Oral Given 08/15/24 1527)  nicotine (NICODERM CQ - dosed in mg/24 hours) patch 21 mg (21 mg Transdermal Patient Refused/Not Given 08/15/24  1604)  alum & mag hydroxide-simeth (MAALOX/MYLANTA) 200-200-20 MG/5ML suspension 15 mL (15 mLs Oral Given 08/15/24 1527)  sodium chloride  0.9 % bolus 1,000 mL (0 mLs Intravenous Stopped 08/15/24 1150)  morphine  (PF) 4 MG/ML injection 4 mg (4 mg Intravenous Given 08/15/24 1039)  alum & mag hydroxide-simeth (MAALOX/MYLANTA) 200-200-20 MG/5ML suspension 30 mL (30 mLs Oral Given 08/15/24 1146)    Clinical Course as of 08/15/24 1653  Sun Aug 15, 2024  1053 Wife is able to give a little bit more history.  Since he left the hospital last week his doctor put him on sulfa and now Levaquin for possible UTI.  She said he has been confused and not eating.  She also wants to let me know that he has been taking horse preparation of invermectin to help care his cancer.  He has been on this for a year. [MB]  1242 Reviewed with Dr. Kallie general surgery.  She does not feel he needs anything acute.  Will see in consult tomorrow.  Place NG if persistent vomiting. [MB]  1258 Discussed with [facility] hospitalist Dr. Ricky who will evaluate patient for admission. [MB]    Clinical Course User Index [MB] Towana Ozell BROCKS, MD                                 Medical Decision Making Amount and/or Complexity of Data Reviewed Labs: ordered. Radiology:  ordered.  Risk OTC drugs. Prescription drug management. Decision regarding hospitalization.   This patient complains of general weakness difficulty walking poor appetite abdominal pain; this involves an extensive number of treatment Options and is a complaint that carries with it a high risk of complications and morbidity. The differential includes diverticulitis, colitis, constipation, obstruction, dehydration, failure to thrive, infection  I ordered, reviewed and interpreted labs, which included CBC with elevated white count chronically low hemoglobin, chemistries with low sodium elevated BUN and creatinine, urinalysis without signs of infection, COVID and flu negative, troponins elevated but flat I ordered medication IV fluids IV pain medicine, IV PPI and reviewed PMP when indicated. I ordered imaging studies which included chest x-ray, CT head, CT abdomen and pelvis and I independently    visualized and interpreted imaging which showed some fluid-filled dilated loops nonspecific possible early bowel obstruction Additional history obtained from patient's wife Previous records obtained and reviewed in epic including recent ED and PCP notes I consulted general surgery Dr. Kallie, [facility] hospitalist Dr. Ricky and discussed lab and imaging findings and discussed disposition.  Cardiac monitoring reviewed, sinus rhythm Social determinants considered, tobacco use Critical Interventions: None  After the interventions stated above, I reevaluated the patient and found patient to be hemodynamically stable but still very weak and unable to ambulate Admission and further testing considered, he would benefit from mission to the hospital for further hydration and management of his symptoms.  He is in agreement with plan for admission.      Final diagnoses:  Generalized weakness  Generalized abdominal pain  AKI (acute kidney injury)  Hyponatremia  Metabolic encephalopathy    ED Discharge  Orders     None          Towana Ozell BROCKS, MD 08/15/24 1655

## 2024-08-15 NOTE — ED Notes (Signed)
 Taken at home PTA: per spouse at [facility]: Levaquin, diltiazem , losartan , zytiga , prednisone .

## 2024-08-15 NOTE — ED Triage Notes (Signed)
 Patient BIB [facility] from home for complaint of Abdominal pain, and generalized weakness. Informed EMS he was seen on 10/3 and dx with a UTI and taking medication for it. Hx of Afib, COPD, and prostrated Cancer in which he is doing treatment for. LBM 10/12.

## 2024-08-15 NOTE — H&P (Signed)
 History and Physical    Patient: Dan [name] FMW:[id number] DOB: [birth year] DOA: 08/15/2024 DOS: the patient was seen and examined on 08/15/2024 PCP: Toribio Jerel MATSU, MD   Patient coming from: Home  Chief Complaint:  Chief Complaint  Patient presents with   Abdominal Pain   Weakness   HPI: Dan [name] is a 78 y.o. male with medical history significant of history of asthma, BPH, prostate cancer, paroxysmal atrial fibrillation, depression/anxiety, COPD, hyperlipidemia and tobacco abuse; who presented to the hospital secondary to generalized weakness, abdominal pain and intermittent confusion. Patient's wife provided most of the history expressed that he had no being himself for the last 3 to 4 days; about a week ago he was brought to the hospital with concern for UTI and has by now completed antibiotic therapy.  No fever, no chills, no productive coughing spells, they have been associated decreased oral intake, some intermittent dry heaving and indigestion complaints.  Workup in the ED demonstrated no signs of infection in his urine or chest x-ray; patient found with acute kidney injury on chronic kidney disease, hyponatremia and hypercalcemia.  There is flat elevation in his troponin, but no chest pain and no acute ischemic changes on EKG.  Abdominal images demonstrating concern for ileus/early SBO.  CT head without contrast demonstrated no acute intracranial abnormalities.    IV fluids initiated and TRH contacted to place patient in the hospital for further evaluation and management.  Review of Systems: As mentioned in the history of present illness. All other systems reviewed and are negative. Past Medical History:  Diagnosis Date   Anemia    Arthritis    Asthma    BPH (benign prostatic hyperplasia)    COPD (chronic obstructive pulmonary disease) (HCC)    Depression    ED (erectile dysfunction)    Gout    Hyperlipidemia    Hypertension    Loss of hearing    Nocturia     Paroxysmal atrial fibrillation (HCC)    Prostate cancer [facility])    Past Surgical History:  Procedure Laterality Date   CARDIOVASCULAR STRESS TEST  12/31/2004   normal perfuction nuclear study w/ no evidence ischemia /  normal LV function and wall motion , ef 52%   PROSTATECTOMY  2019   TOTAL HIP ARTHROPLASTY Left    TOTAL HIP ARTHROPLASTY Right 01/20/2024   Procedure: ARTHROPLASTY, HIP, TOTAL, ANTERIOR APPROACH;  Surgeon: Ernie Cough, MD;  Location: [facility];  Service: Orthopedics;  Laterality: Right;   Social History:  reports that he has been smoking cigarettes. He started smoking about 57 years ago. He has a 28.6 pack-year smoking history. He has never been exposed to tobacco smoke. He has never used smokeless tobacco. He reports that he does not currently use alcohol . He reports that he does not currently use drugs.  Allergies  Allergen Reactions   Tamsulosin Hives and Itching    Family History  Problem Relation Age of Onset   Hypertension Mother    Cancer Father        lung   Colon cancer Neg Hx    Inflammatory bowel disease Neg Hx    Pancreatic cancer Neg Hx     Prior to Admission medications   Medication Sig Start Date End Date Taking? Authorizing Provider  abiraterone  acetate (ZYTIGA ) 250 MG tablet Take 4 tablets (1,000 mg total) by mouth daily. Take on an empty stomach 1 hour before or 2 hours after a meal 11/26/23   Rogers Hai, MD  allopurinol  (ZYLOPRIM ) 100 MG tablet Take 100 mg by mouth daily. 01/30/21   [provider]  Ascorbic Acid (VITAMIN C) 1000 MG tablet Take 1,000 mg by mouth daily.    [provider]  budesonide-glycopyrrolate-formoterol (BREZTRI  AEROSPHERE) 160-9-4.8 MCG/ACT AERO inhaler Inhale 2 puffs into the lungs in the morning and at bedtime. Patient not taking: Reported on 06/28/2024 05/17/24   Darlean Ozell NOVAK, MD  cholecalciferol (VITAMIN D3) 25 MCG (1000 UNIT) tablet Take 1,000 Units by mouth daily.    [provider]   diltiazem  (CARDIZEM  CD) 360 MG 24 hr capsule Take 360 mg by mouth daily. 03/14/22   [provider]  HYDROcodone -acetaminophen  (NORCO) 10-325 MG tablet Take 1 tablet by mouth every 6 (six) hours as needed. 08/06/24   Towana Ozell BROCKS, MD  losartan  (COZAAR ) 25 MG tablet TAKE 1 TABLET (25 MG TOTAL) BY MOUTH DAILY. 07/27/24   Alvan Dorn FALCON, MD  magnesium  oxide (MAG-OX) 400 (240 Mg) MG tablet TAKE 1 TABLET BY MOUTH EVERY DAY 06/10/23   Rogers Hai, MD  megestrol  (MEGACE ) 40 MG/ML suspension Take 400 mg by mouth daily as needed (appetite). 12/29/22   [provider]  methocarbamol  (ROBAXIN ) 500 MG tablet Take 1 tablet (500 mg total) by mouth every 6 (six) hours as needed for muscle spasms. 01/21/24   Patti Rosina SAUNDERS, PA-C  methylphenidate  (RITALIN ) 5 MG tablet Take 1 tablet (5 mg total) by mouth 2 (two) times daily as needed. Take 1 tab with breakfast and 1 tab with lunch as needed 08/02/24   Kandala, Hyndavi, MD  polyethylene glycol (MIRALAX  / GLYCOLAX ) 17 g packet Take 17 g by mouth 2 (two) times daily. Patient not taking: Reported on 06/28/2024 01/21/24   Patti Rosina SAUNDERS, PA-C  potassium chloride  (MICRO-K ) 10 MEQ CR capsule TAKE 2 CAPSULES BY MOUTH DAILY. 09/06/20   Richarda Prentice LITTIE Mickey., NP  predniSONE  (DELTASONE ) 5 MG tablet Take 1 tablet (5 mg total) by mouth daily with breakfast. 08/18/23   Rogers Hai, MD  rivaroxaban  (XARELTO ) 10 MG TABS tablet Take 1 tablet (10 mg total) by mouth daily with breakfast for 21 days. Patient not taking: Reported on 06/28/2024 01/21/24 05/06/24  Patti Rosina SAUNDERS, PA-C  Zinc 20 MG CAPS Take 40 mg by mouth daily.    [provider]    Physical Exam: Vitals:   08/15/24 1300 08/15/24 1315 08/15/24 1330 08/15/24 1455  BP: (!) 144/66 (!) 141/59 (!) 142/64 (!) 144/66  Pulse: 71 78 84 88  Resp: 17 17 18 16   Temp:    98.3 F (36.8 C)  TempSrc:    Oral  SpO2: 95% 97% 95% 96%  Weight:    59.4 kg  Height:    5' 7 (1.702 m)    General exam: Alert, awake, able to follow simple commands; requesting for nicotine patch and expressing mild indigestion and anorexia. Respiratory system: Positive scattered rhonchi; no using accessory muscles.  Good saturation on room air. Cardiovascular system: Rate controlled, no rubs, no gallops, no JVD. Gastrointestinal system: Abdomen is soft, slightly distended, vaguely tender to deep palpation.  Positive bowel sounds and no guarding. Central nervous system: No focal neurological deficits. Extremities: No cyanosis or clubbing. Skin: No petechiae. Psychiatry: Mood & affect appropriate.   Data Reviewed: Ethanol level <15 Comprehensive metabolic panel: Sodium 127, potassium 4.1, chloride 88, bicarb 24, BUN 38, creatinine 2.19, calcium 11.6, normal LFTs and GFR 30. High sensitive troponin: 115>> 98 CBC: WBCs 14.1, hemoglobin 10.0, platelet count 641K  Respiratory panel PCR: Negative for COVID, influenza and RSV. Lipase: 42   Assessment and Plan: 1-acute metabolic encephalopathy -In the setting of hyponatremia and hypercalcemia most likely - To be thorough we will also check B12 level and TSH - CT head negative for acute intracranial abnormality - No signs of acute infection. - Will provide fluid resuscitation - Aredia will be given to help decrease in calcium levels. - Minimize sedative agents, provide constant orientation and follow clinical response.  2-acute kidney injury on chronic kidney disease stage II at baseline - In the setting of dehydration and prerenal azotemia - Provide fluid resuscitation - Follow renal function trend - Minimize nephrotoxic agents, avoid contrast and hypotension.  3-abdominal pain/ileus - Patient reports having bowel movement on 08/14/2024 - Will provide full liquid diet, PPIs, as needed antiemetics and analgesics - Provide fluid resuscitation and slowly advance diet as tolerated - Initially allowing just full liquid for bowel rest. -  Follow response.  4-GERD - Continue PPI and Carafate.  5-tobacco abuse - Cessation counseling provided - Nicotine patch has been ordered.  6-history of COPD - No acute exacerbation currently appreciated - Continue bronchodilator management and follow response.  Good saturation on room air.  7-history of gout - No acute flare - Continue allopurinol .  8-history of hypertension - Continue Cardizem  and - Follow vital signs - Holding the rest of his antihypertensive agents due to acute kidney injury.  9-history of prostate cancer - Continue outpatient follow-up with oncology/urology service - Will continue daily low-dose prednisone  - Patient's hypercalcemia most likely associated with ongoing malignancy.  10-history of atrial fibrillation - Continue Cardizem  for rate control - Patient not using anticoagulation.    Advance Care Planning:   Code Status: Full Code   Consults: None  Family Communication: Wife at bedside.  Severity of Illness: The appropriate patient status for this patient is OBSERVATION. Observation status is judged to be reasonable and necessary in order to provide the required intensity of service to ensure the patient's safety. The patient's presenting symptoms, physical exam findings, and initial radiographic and laboratory data in the context of their medical condition is felt to place them at decreased risk for further clinical deterioration. Furthermore, it is anticipated that the patient will be medically stable for discharge from the hospital within 2 midnights of admission.   Author: Eric Nunnery, MD 08/15/2024 5:05 PM  For on call review [email or web address].

## 2024-08-15 NOTE — ED Notes (Signed)
 Given socks and drink per request. PT alert, NAD, calm, interactive, resps e/u, skin W&D, speaking in clear complete sentences. Family at [facility]. Updated.

## 2024-08-15 NOTE — Progress Notes (Signed)
 Pt arrived to room 340 via stretcher from ED. Pt unable to stand or ambulate, moved by staff from stretcher to bed. Pt oriented x 4. HOH, wife states left hearing aids at home. Oriented to room and safety procedures, call bell within reach, bed alarm on. Advised to call for needs. Wife & pt both state understanding.

## 2024-08-15 NOTE — Plan of Care (Signed)
   Problem: Education: Goal: Knowledge of General Education information will improve Description Including pain rating scale, medication(s)/side effects and non-pharmacologic comfort measures Outcome: Progressing   Problem: Health Behavior/Discharge Planning: Goal: Ability to manage health-related needs will improve Outcome: Progressing

## 2024-08-16 ENCOUNTER — Encounter: Payer: Self-pay | Admitting: Oncology

## 2024-08-16 ENCOUNTER — Ambulatory Visit

## 2024-08-16 ENCOUNTER — Telehealth: Payer: Self-pay | Admitting: Urology

## 2024-08-16 DIAGNOSIS — K219 Gastro-esophageal reflux disease without esophagitis: Secondary | ICD-10-CM | POA: Diagnosis not present

## 2024-08-16 DIAGNOSIS — N179 Acute kidney failure, unspecified: Secondary | ICD-10-CM | POA: Diagnosis not present

## 2024-08-16 DIAGNOSIS — R4182 Altered mental status, unspecified: Secondary | ICD-10-CM | POA: Diagnosis not present

## 2024-08-16 DIAGNOSIS — E871 Hypo-osmolality and hyponatremia: Secondary | ICD-10-CM | POA: Diagnosis not present

## 2024-08-16 DIAGNOSIS — I129 Hypertensive chronic kidney disease with stage 1 through stage 4 chronic kidney disease, or unspecified chronic kidney disease: Secondary | ICD-10-CM | POA: Diagnosis not present

## 2024-08-16 DIAGNOSIS — R1084 Generalized abdominal pain: Secondary | ICD-10-CM | POA: Diagnosis not present

## 2024-08-16 DIAGNOSIS — N182 Chronic kidney disease, stage 2 (mild): Secondary | ICD-10-CM | POA: Diagnosis not present

## 2024-08-16 DIAGNOSIS — F419 Anxiety disorder, unspecified: Secondary | ICD-10-CM | POA: Diagnosis not present

## 2024-08-16 DIAGNOSIS — K567 Ileus, unspecified: Secondary | ICD-10-CM | POA: Diagnosis not present

## 2024-08-16 DIAGNOSIS — E860 Dehydration: Secondary | ICD-10-CM | POA: Diagnosis not present

## 2024-08-16 DIAGNOSIS — R531 Weakness: Secondary | ICD-10-CM | POA: Diagnosis not present

## 2024-08-16 DIAGNOSIS — I7143 Infrarenal abdominal aortic aneurysm, without rupture: Secondary | ICD-10-CM | POA: Diagnosis not present

## 2024-08-16 DIAGNOSIS — Z9079 Acquired absence of other genital organ(s): Secondary | ICD-10-CM | POA: Diagnosis not present

## 2024-08-16 DIAGNOSIS — R109 Unspecified abdominal pain: Secondary | ICD-10-CM | POA: Diagnosis not present

## 2024-08-16 DIAGNOSIS — R059 Cough, unspecified: Secondary | ICD-10-CM | POA: Diagnosis not present

## 2024-08-16 DIAGNOSIS — F17210 Nicotine dependence, cigarettes, uncomplicated: Secondary | ICD-10-CM | POA: Diagnosis not present

## 2024-08-16 DIAGNOSIS — F32A Depression, unspecified: Secondary | ICD-10-CM | POA: Diagnosis not present

## 2024-08-16 DIAGNOSIS — Z8546 Personal history of malignant neoplasm of prostate: Secondary | ICD-10-CM | POA: Diagnosis not present

## 2024-08-16 DIAGNOSIS — Z1152 Encounter for screening for COVID-19: Secondary | ICD-10-CM | POA: Diagnosis not present

## 2024-08-16 DIAGNOSIS — I480 Paroxysmal atrial fibrillation: Secondary | ICD-10-CM | POA: Diagnosis not present

## 2024-08-16 DIAGNOSIS — Z87440 Personal history of urinary (tract) infections: Secondary | ICD-10-CM | POA: Diagnosis not present

## 2024-08-16 DIAGNOSIS — G9341 Metabolic encephalopathy: Secondary | ICD-10-CM | POA: Diagnosis not present

## 2024-08-16 DIAGNOSIS — N400 Enlarged prostate without lower urinary tract symptoms: Secondary | ICD-10-CM | POA: Diagnosis not present

## 2024-08-16 DIAGNOSIS — Z8249 Family history of ischemic heart disease and other diseases of the circulatory system: Secondary | ICD-10-CM | POA: Diagnosis not present

## 2024-08-16 DIAGNOSIS — M109 Gout, unspecified: Secondary | ICD-10-CM | POA: Diagnosis not present

## 2024-08-16 DIAGNOSIS — Z96643 Presence of artificial hip joint, bilateral: Secondary | ICD-10-CM | POA: Diagnosis not present

## 2024-08-16 DIAGNOSIS — J4489 Other specified chronic obstructive pulmonary disease: Secondary | ICD-10-CM | POA: Diagnosis not present

## 2024-08-16 DIAGNOSIS — Z7901 Long term (current) use of anticoagulants: Secondary | ICD-10-CM | POA: Diagnosis not present

## 2024-08-16 DIAGNOSIS — E785 Hyperlipidemia, unspecified: Secondary | ICD-10-CM | POA: Diagnosis not present

## 2024-08-16 LAB — CBC
HCT: 25.2 % — ABNORMAL LOW (ref 39.0–52.0)
Hemoglobin: 8.4 g/dL — ABNORMAL LOW (ref 13.0–17.0)
MCH: 29.7 pg (ref 26.0–34.0)
MCHC: 33.3 g/dL (ref 30.0–36.0)
MCV: 89 fL (ref 80.0–100.0)
Platelets: 554 K/uL — ABNORMAL HIGH (ref 150–400)
RBC: 2.83 MIL/uL — ABNORMAL LOW (ref 4.22–5.81)
RDW: 16.3 % — ABNORMAL HIGH (ref 11.5–15.5)
WBC: 12.4 K/uL — ABNORMAL HIGH (ref 4.0–10.5)
nRBC: 0 % (ref 0.0–0.2)

## 2024-08-16 LAB — BASIC METABOLIC PANEL WITH GFR
Anion gap: 11 (ref 5–15)
BUN: 26 mg/dL — ABNORMAL HIGH (ref 8–23)
CO2: 23 mmol/L (ref 22–32)
Calcium: 10.8 mg/dL — ABNORMAL HIGH (ref 8.9–10.3)
Chloride: 97 mmol/L — ABNORMAL LOW (ref 98–111)
Creatinine, Ser: 1.21 mg/dL (ref 0.61–1.24)
GFR, Estimated: >60 mL/min (ref >60)
Glucose, Bld: 99 mg/dL (ref 70–99)
Potassium: 3.7 mmol/L (ref 3.5–5.1)
Sodium: 131 mmol/L — ABNORMAL LOW (ref 135–145)

## 2024-08-16 MED ORDER — ABIRATERONE ACETATE 250 MG PO TABS: 1000.0000 mg | ORAL_TABLET | Freq: Every day | ORAL | Status: DC

## 2024-08-16 MED ORDER — BISACODYL 10 MG RE SUPP
10.0000 mg | Freq: Every day | RECTAL | Status: DC | PRN
  Administered 2024-08-16: 10 mg via RECTAL
  Filled 2024-08-16: qty 1

## 2024-08-16 MED ORDER — FLUTICASONE FUROATE-VILANTEROL 100-25 MCG/ACT IN AEPB
1.0000 | INHALATION_SPRAY | Freq: Every day | RESPIRATORY_TRACT | Status: DC
  Administered 2024-08-17 – 2024-08-18 (×2): 1 via RESPIRATORY_TRACT
  Filled 2024-08-16: qty 28

## 2024-08-16 MED ORDER — ABIRATERONE ACETATE 250 MG PO TABS
1000.0000 mg | ORAL_TABLET | Freq: Every day | ORAL | Status: DC
  Administered 2024-08-16 – 2024-08-18 (×3): 1000 mg via ORAL
  Filled 2024-08-16: qty 4

## 2024-08-16 MED ORDER — DOCUSATE SODIUM 100 MG PO CAPS
100.0000 mg | ORAL_CAPSULE | Freq: Two times a day (BID) | ORAL | Status: DC
  Administered 2024-08-16 – 2024-08-17 (×3): 100 mg via ORAL
  Filled 2024-08-16 (×3): qty 1

## 2024-08-16 NOTE — Progress Notes (Signed)
   08/16/24 1529  Spiritual Encounters  Type of Visit Attempt (pt unavailable)  Referral source Clinical staff  Reason for visit Routine spiritual support  OnCall Visit No   Chaplain attempting to visit Pt.  Upon arrival Pt was making way to the restroom and stated it would not be a good time for a visit. Asked me to return tomorrow.  Maude Roll, MDiv Chaplain, [facility] [email or web address] [phone]

## 2024-08-16 NOTE — Plan of Care (Signed)

## 2024-08-16 NOTE — TOC Initial Note (Signed)
 Transition of Care [facility]) - Initial/Assessment Note    Patient Details  Name: Dan [name] MRN: [id number] Date of Birth: 1946-05-04  [facility] CM/SW Contact:    Hoy DELENA Bigness, LCSW Phone Number: 08/16/2024, 1:58 PM  Clinical Narrative:                 Pt admitted for abdominal pain and weakness. Pt at risk for readmission. Pt from home with spouse. Pt has RW's at home and denies having further DME needs. Pt agreeable to recommendation for [facility] and does not have an agency preference. HHPT has been arranged with [facility]. HH order will need to be placed prior to discharge.   Expected Discharge Plan: Home w Home Health Services Barriers to Discharge: Continued Medical Work up   Patient Goals and CMS Choice Patient states their goals for this hospitalization and ongoing recovery are:: To feel better CMS Medicare.gov Compare Post Acute Care list provided to:: Patient Choice offered to / list presented to : Patient      Expected Discharge Plan and Services In-house Referral: Clinical Social Work Discharge Planning Services: NA Post Acute Care Choice: Home Health Living arrangements for the past 2 months: Single Family Home                 DME Arranged: N/A DME Agency: NA       HH Arranged: PT HH AgencyHotel manager Home Health Care Date [facility] Agency Contacted: 08/16/24 Time HH Agency Contacted: [id number] Representative spoke with at [facility] Agency: Joane  Prior Living Arrangements/Services Living arrangements for the past 2 months: Single Family Home Lives with:: Spouse Patient language and need for interpreter reviewed:: Yes Do you feel safe going back to the place where you live?: Yes      Need for Family Participation in Patient Care: Yes (Comment) Care giver support system in place?: No (comment) Current home services: DME (RW's) Criminal Activity/Legal Involvement Pertinent to Current Situation/Hospitalization: No - Comment as needed  Activities of Daily Living    ADL Screening (condition at time of admission) Independently performs ADLs?: Yes (appropriate for developmental age) Is the patient deaf or have difficulty hearing?: Yes Does the patient have difficulty seeing, even when wearing glasses/contacts?: No Does the patient have difficulty concentrating, remembering, or making decisions?: No  Permission Sought/Granted Permission sought to share information with : Facility Medical sales representative, Family Supports Permission granted to share information with : Yes, Verbal Permission Granted     Permission granted to share info w AGENCY: HHA's        Emotional Assessment Appearance:: Appears stated age Attitude/Demeanor/Rapport: Engaged Affect (typically observed): Accepting Orientation: : Oriented to Self, Oriented to Place, Oriented to  Time Alcohol  / Substance Use: Not Applicable Psych Involvement: No (comment)  Admission diagnosis:  AKI (acute kidney injury) [N17.9] Patient Active Problem List   Diagnosis Date Noted   AKI (acute kidney injury) 08/15/2024   Osteonecrosis of hip (HCC) 05/12/2024   S/P total right hip arthroplasty 01/20/2024   Osteoporosis 03/26/2023   Genetic testing 03/13/2023   Malignant neoplasm of prostate metastatic to intra-abdominal lymph node (HCC) 12/31/2022   Joint pain 07/03/2021   Other specified abnormal immunological findings in serum 07/03/2021   Primary osteoarthritis 07/03/2021   Abnormal CT scan, colon 01/22/2021   History of Clostridioides difficile colitis 01/22/2021   Diarrhea 01/22/2021   Dilated pancreatic duct 01/22/2021   Loss of appetite 11/27/2020   GERD (gastroesophageal reflux disease) 11/11/2020   Hematochezia 11/11/2020  History of prostate cancer 11/11/2020   Chest wall pain 10/31/2020   Anemia 08/02/2020   Other specified personal risk factors, not elsewhere classified 07/18/2020   Body mass index (BMI) 22.0-22.9, adult 04/20/2020   PMR (polymyalgia rheumatica) 03/29/2020    Fatigue 03/28/2020   Chronic diastolic CHF (congestive heart failure) (HCC) 03/09/2020   Atrial fibrillation (HCC) 12/15/2019   Claudication 12/15/2019   Palpitations 12/15/2019   Cystitis 09/20/2019   Cigarette smoker 07/02/2019   Malignant neoplasm of prostate metastatic to intrapelvic lymph node (HCC) 09/23/2018   Elevated PSA 05/27/2018   Hyperlipidemia    Hypertension    COPD GOLD ? / Group E    Depression    Gout    Alcohol  dependency [facility])    ED (erectile dysfunction)    Loss of hearing    PCP:  Toribio Jerel MATSU, MD Pharmacy:   [facility] [address] [place] - [address] [place] [address] Phone: [phone] Fax: [phone]  [id number] - [place],  - [address] [place] [address] Phone: [phone] Fax: [phone]  DARRYLE LONG - [facility] [address] [place] [address] Phone: [phone] Fax: [phone]     Social Drivers of Health (SDOH) Social History: SDOH Screenings   Food Insecurity: No Food Insecurity (08/15/2024)  Housing: Low Risk  (08/15/2024)  Transportation Needs: No Transportation Needs (08/15/2024)  Utilities: Not At Risk (08/15/2024)  Depression (PHQ2-9): Low Risk  (05/06/2024)  Social Connections: Socially Integrated (08/15/2024)  Tobacco Use: High Risk (08/06/2024)   SDOH Interventions: Housing Interventions: Intervention Not Indicated Transportation Interventions: Intervention Not Indicated Utilities Interventions: Intervention Not Indicated Social Connections Interventions: Intervention Not Indicated   Readmission Risk Interventions    08/16/2024   11:20 AM  Readmission Risk Prevention Plan  Transportation Screening Complete  PCP or Specialist Appt within 3-5 Days Complete  HRI or Home Care Consult Complete  Social Work Consult for Recovery Care Planning/Counseling Complete  Palliative Care Screening Not Applicable   Medication Review Ocean[employer]) Complete

## 2024-08-16 NOTE — Plan of Care (Signed)
  Problem: Acute Rehab PT Goals(only PT should resolve) Goal: Pt Will Go Supine/Side To Sit Outcome: Progressing Flowsheets (Taken 08/16/2024 1543) Pt will go Supine/Side to Sit:  Independently  with modified independence Goal: Patient Will Transfer Sit To/From Stand Outcome: Progressing Flowsheets (Taken 08/16/2024 1543) Patient will transfer sit to/from stand:  with modified independence  with supervision Goal: Pt Will Transfer Bed To Chair/Chair To Bed Outcome: Progressing Flowsheets (Taken 08/16/2024 1543) Pt will Transfer Bed to Chair/Chair to Bed:  with modified independence  with supervision Goal: Pt Will Ambulate Outcome: Progressing Flowsheets (Taken 08/16/2024 1543) Pt will Ambulate:  50 feet  with modified independence  with supervision  with rolling walker   3:43 PM, 08/16/24 Lynwood Music, MPT Physical Therapist with [facility] [phone] office [phone] mobile phone

## 2024-08-16 NOTE — Telephone Encounter (Signed)
 Return call to pt's wife. She want to know if pt can get his Firmagon  injection in the hospital. Pt's wife is aware a message will be sent to MD, McKenzie on advisement. Voiced understanding

## 2024-08-16 NOTE — Telephone Encounter (Signed)
 Patient called into the office today with general questions/concerns regarding patient is in hospital and is supposed to have Firmagon  injection. She would like to talk to the nurse Patient may be reached at [phone] to discuss questions.

## 2024-08-16 NOTE — Evaluation (Signed)
 Physical Therapy Evaluation Patient Details Name: Dan [name] MRN: [id number] DOB: 06-03-1946 Today's Date: 08/16/2024  History of Present Illness  Dan [name] is a 78 y.o. male with medical history significant of history of asthma, BPH, prostate cancer, paroxysmal atrial fibrillation, depression/anxiety, COPD, hyperlipidemia and tobacco abuse; who presented to the hospital secondary to generalized weakness, abdominal pain and intermittent confusion.  Patient's wife provided most of the history expressed that he had no being himself for the last 3 to 4 days; about a week ago he was brought to the hospital with concern for UTI and has by now completed antibiotic therapy.     No fever, no chills, no productive coughing spells, they have been associated decreased oral intake, some intermittent dry heaving and indigestion complaints.     Workup in the ED demonstrated no signs of infection in his urine or chest x-ray; patient found with acute kidney injury on chronic kidney disease, hyponatremia and hypercalcemia.  There is flat elevation in his troponin, but no chest pain and no acute ischemic changes on EKG.     Abdominal images demonstrating concern for ileus/early SBO.  CT head without contrast demonstrated no acute intracranial abnormalities.       IV fluids initiated and TRH contacted to place patient in the hospital for further evaluation and management.   Clinical Impression  Patient limited for functional activities and gait training mostly due to c/o fatigue and mild nausea. Patient left seated on commode in bathroom to attempt BM, RN notified, family members present in room. Patient will benefit from continued skilled physical therapy in hospital and recommended venue below to increase strength, balance, endurance for safe ADLs and gait.         If plan is discharge home, recommend the following: A little help with walking and/or transfers;A little help with bathing/dressing/bathroom;Assistance  with cooking/housework;Assist for transportation   Can travel by private vehicle        Equipment Recommendations None recommended by PT  Recommendations for Other Services       Functional Status Assessment Patient has had a recent decline in their functional status and demonstrates the ability to make significant improvements in function in a reasonable and predictable amount of time.     Precautions / Restrictions Precautions Precautions: Fall Recall of Precautions/Restrictions: Intact Restrictions Weight Bearing Restrictions Per Provider Order: No      Mobility  Bed Mobility Overal bed mobility: Needs Assistance Bed Mobility: Supine to Sit     Supine to sit: Supervision     General bed mobility comments: slow labored movement    Transfers Overall transfer level: Needs assistance Equipment used: Rolling walker (2 wheels) Transfers: Bed to chair/wheelchair/BSC, Sit to/from Stand Sit to Stand: Contact guard assist   Step pivot transfers: Contact guard assist       General transfer comment: increased time, labored movement    Ambulation/Gait Ambulation/Gait assistance: Contact guard assist, Min assist Gait Distance (Feet): 15 Feet Assistive device: Rolling walker (2 wheels) Gait Pattern/deviations: Decreased step length - right, Decreased step length - left, Decreased stride length Gait velocity: decreased     General Gait Details: slow labored movement without loss of balance using RW, limited mostly due to fatigue  Stairs            Wheelchair Mobility     Tilt Bed    Modified Rankin (Stroke Patients Only)       Balance Overall balance assessment: Needs assistance Sitting-balance support: Feet supported, No  upper extremity supported Sitting balance-Leahy Scale: Fair Sitting balance - Comments: fair/good seated at EOB   Standing balance support: Reliant on assistive device for balance, During functional activity, Bilateral upper  extremity supported Standing balance-Leahy Scale: Fair Standing balance comment: using RW                             Pertinent Vitals/Pain Pain Assessment Pain Assessment: Faces Faces Pain Scale: Hurts little more Pain Location: stomach Pain Descriptors / Indicators: Discomfort, Aching Pain Intervention(s): Limited activity within patient's tolerance, Monitored during session, Repositioned    Home Living Family/patient expects to be discharged to:: Private residence Living Arrangements: Spouse/significant other Available Help at Discharge: Family Type of Home: House Home Access: Stairs to enter Entrance Stairs-Rails: Can reach both;Left;Right Entrance Stairs-Number of Steps: 2   Home Layout: One level Home Equipment: Agricultural consultant (2 wheels);Rollator (4 wheels);Cane - single point      Prior Function Prior Level of Function : Needs assist       Physical Assist : Mobility (physical);ADLs (physical) Mobility (physical): Bed mobility;Transfers;Gait;Stairs   Mobility Comments: household ambulation using RW ADLs Comments: Assisted by family     Extremity/Trunk Assessment   Upper Extremity Assessment Upper Extremity Assessment: Generalized weakness    Lower Extremity Assessment Lower Extremity Assessment: Generalized weakness    Cervical / Trunk Assessment Cervical / Trunk Assessment: Normal  Communication   Communication Communication: No apparent difficulties    Cognition Arousal: Alert Behavior During Therapy: WFL for tasks assessed/performed   PT - Cognitive impairments: No apparent impairments                         Following commands: Intact       Cueing Cueing Techniques: Verbal cues     General Comments      Exercises     Assessment/Plan    PT Assessment Patient needs continued PT services  PT Problem List Decreased strength;Decreased activity tolerance;Decreased balance;Decreased mobility;Pain       PT  Treatment Interventions DME instruction;Gait training;Stair training;Functional mobility training;Therapeutic activities;Therapeutic exercise;Balance training;Patient/family education    PT Goals (Current goals can be found in the Care Plan section)  Acute Rehab PT Goals Patient Stated Goal: return home with family to assist PT Goal Formulation: With patient/family Time For Goal Achievement: 08/26/24 Potential to Achieve Goals: Good    Frequency Min 3X/week     Co-evaluation               AM-PAC PT 6 Clicks Mobility  Outcome Measure Help needed turning from your back to your side while in a flat bed without using bedrails?: None Help needed moving from lying on your back to sitting on the side of a flat bed without using bedrails?: None Help needed moving to and from a bed to a chair (including a wheelchair)?: A Little Help needed standing up from a chair using your arms (e.g., wheelchair or bedside chair)?: A Little Help needed to walk in hospital room?: A Little Help needed climbing 3-5 steps with a railing? : A Lot 6 Click Score: 19    End of Session   Activity Tolerance: Patient tolerated treatment well;Patient limited by fatigue Patient left: with call bell/phone within reach;Other (comment);with family/visitor present (left sitting on commode in bathroom) Nurse Communication: Mobility status PT Visit Diagnosis: Unsteadiness on feet (R26.81);Other abnormalities of gait and mobility (R26.89);Muscle weakness (generalized) (M62.81)    Time:  8859-8842 PT Time Calculation (min) (ACUTE ONLY): 17 min   Charges:   PT Evaluation $PT Eval Low Complexity: 1 Low PT Treatments $Therapeutic Activity: 8-22 mins PT General Charges $$ ACUTE PT VISIT: 1 Visit         3:42 PM, 08/16/24 Lynwood Music, MPT Physical Therapist with [facility] [phone] office [phone] mobile phone

## 2024-08-16 NOTE — Progress Notes (Signed)
 Called patient wife Rollo) at request of patient to notify her of recent bowel movement. Patient wife Rollo) was appreciative of the call.

## 2024-08-16 NOTE — Plan of Care (Signed)

## 2024-08-16 NOTE — Progress Notes (Signed)
 Progress Note   Patient: Dan [name] FMW:[id number] DOB: [birth year] DOA: 08/15/2024     0 DOS: the patient was seen and examined on 08/16/2024   Brief hospital admission narrative: Dan [name] is a 78 y.o. male with medical history significant of history of asthma, BPH, prostate cancer, paroxysmal atrial fibrillation, depression/anxiety, COPD, hyperlipidemia and tobacco abuse; who presented to the hospital secondary to generalized weakness, abdominal pain and intermittent confusion. Patient's wife provided most of the history expressed that he had no being himself for the last 3 to 4 days; about a week ago he was brought to the hospital with concern for UTI and has by now completed antibiotic therapy.   No fever, no chills, no productive coughing spells, they have been associated decreased oral intake, some intermittent dry heaving and indigestion complaints.   Workup in the ED demonstrated no signs of infection in his urine or chest x-ray; patient found with acute kidney injury on chronic kidney disease, hyponatremia and hypercalcemia.  There is flat elevation in his troponin, but no chest pain and no acute ischemic changes on EKG.   Abdominal images demonstrating concern for ileus/early SBO.  CT head without contrast demonstrated no acute intracranial abnormalities.     IV fluids initiated and TRH contacted to place patient in the hospital for further evaluation and management.  Assessment and plan 1-acute metabolic encephalopathy -in the setting of hypercalcemia, hyponatremia and probably meds. -improved with fluid resuscitation and electrolyte repletion. -no acute infection; neg CT head. -will continue hydration and supportive care -minimize/avoid sedative agents.  2-abd pain/ileus -abd pain improved -still no BM -patient reporting nausea, but not vomiting. -will adjust bowel regimen and follow response. -repeat Abd x-ray in am -continue supportive care, antiemetics and as  needed analgesics.  3-GERD -will continue PPI and carafate -pending improvement/clinical response will decide regarding the need for inpatient GI consultation.  4-AKI superimposed on CKD stage 2 -improved/resolved after IVF's -continue minimizing nephrotoxic agents.  5-tobacco abuse -cessation counseling provided -continue Nicoderm  6-hx of COPD -stable -continue home bronchodilator management  7-hx of HTN and A. fib -continue cardizem  -not on chronic anticoagulation -will hold losartan  in the setting of AKI and decreased oral intake.  8-hx of gout -continue allopurinol   9-hx of prostate cancer/hypercalcemia -continue zytiga  and prednisone   -continue outpatient follow up with urology/oncology service. -will check Ca trend and assess PTH -s/p pamidronate X 1 dose. -continue to maintain adequate hydration.   Subjective:  Afebrile, improved labs appreciated; no CP, no SOB. Complaining of indigestion, dry heaving and anorexia.  Physical Exam: Vitals:   08/16/24 0322 08/16/24 0847 08/16/24 1413 08/16/24 2028  BP: 138/62  (!) 142/69 (!) 156/76  Pulse: 86  80 76  Resp: 19  17 (!) 21  Temp: 98.2 F (36.8 C)  98 F (36.7 C) 98.3 F (36.8 C)  TempSrc: Oral   Oral  SpO2: 93% 93% 98% 92%  Weight:      Height:       General exam: Alert, awake, oriented x 3; afebrile. Mild distress due to ongoing indigestion. Respiratory system: Clear to auscultation. Respiratory effort normal. Cardiovascular system:RRR. No murmurs, rubs, gallops. Gastrointestinal system: Abdomen is slightly distended, no guarding, no pain to palpation, positive BS on exam. Central nervous system: Alert and oriented. No focal neurological deficits. Extremities: No cyanosis, no clubbing, no edema. Skin: No petechiae. Psychiatry: Judgement and insight appear normal. Flat affect appreciated.  Data Reviewed: CBC:WBC 12.4, Hgb 8.4, platelets 554K BMET: sodium 131, potassium  3.7, chloride 97, BUN 26,  Creatinine 1.21 and GFR > 60  Family Communication: wife at bedside.  Disposition: Status is: Inpatient Remains inpatient appropriate because: IV therapy.  Anticipating discharge back home once medically stable.  Time spent: 50 minutes  Author: Eric Nunnery, MD 08/16/2024 9:11 PM  For on call review [email or web address].

## 2024-08-17 ENCOUNTER — Ambulatory Visit

## 2024-08-17 ENCOUNTER — Inpatient Hospital Stay (HOSPITAL_COMMUNITY)

## 2024-08-17 ENCOUNTER — Ambulatory Visit (HOSPITAL_COMMUNITY): Admission: RE | Admit: 2024-08-17 | Source: Ambulatory Visit

## 2024-08-17 DIAGNOSIS — R1084 Generalized abdominal pain: Secondary | ICD-10-CM | POA: Diagnosis not present

## 2024-08-17 DIAGNOSIS — E871 Hypo-osmolality and hyponatremia: Secondary | ICD-10-CM | POA: Diagnosis not present

## 2024-08-17 DIAGNOSIS — N179 Acute kidney failure, unspecified: Secondary | ICD-10-CM | POA: Diagnosis not present

## 2024-08-17 DIAGNOSIS — R531 Weakness: Secondary | ICD-10-CM | POA: Diagnosis not present

## 2024-08-17 LAB — CBC
HCT: 27.7 % — ABNORMAL LOW (ref 39.0–52.0)
Hemoglobin: 9.2 g/dL — ABNORMAL LOW (ref 13.0–17.0)
MCH: 29.8 pg (ref 26.0–34.0)
MCHC: 33.2 g/dL (ref 30.0–36.0)
MCV: 89.6 fL (ref 80.0–100.0)
Platelets: 597 K/uL — ABNORMAL HIGH (ref 150–400)
RBC: 3.09 MIL/uL — ABNORMAL LOW (ref 4.22–5.81)
RDW: 16.2 % — ABNORMAL HIGH (ref 11.5–15.5)
WBC: 13.4 K/uL — ABNORMAL HIGH (ref 4.0–10.5)
nRBC: 0 % (ref 0.0–0.2)

## 2024-08-17 LAB — BASIC METABOLIC PANEL WITH GFR
Anion gap: 15 (ref 5–15)
BUN: 23 mg/dL (ref 8–23)
CO2: 25 mmol/L (ref 22–32)
Calcium: 10.6 mg/dL — ABNORMAL HIGH (ref 8.9–10.3)
Chloride: 98 mmol/L (ref 98–111)
Creatinine, Ser: 1.26 mg/dL — ABNORMAL HIGH (ref 0.61–1.24)
GFR, Estimated: 59 mL/min — ABNORMAL LOW (ref >60)
Glucose, Bld: 89 mg/dL (ref 70–99)
Potassium: 4 mmol/L (ref 3.5–5.1)
Sodium: 138 mmol/L (ref 135–145)

## 2024-08-17 LAB — MAGNESIUM: Magnesium: 2.2 mg/dL (ref 1.7–2.4)

## 2024-08-17 LAB — PARATHYROID HORMONE, INTACT (NO CA): PTH: 15 pg/mL (ref 15–65)

## 2024-08-17 MED ORDER — POLYETHYLENE GLYCOL 3350 17 G PO PACK
17.0000 g | PACK | Freq: Every day | ORAL | Status: DC | PRN
Start: 2024-08-17 — End: 2024-08-18

## 2024-08-17 NOTE — Progress Notes (Signed)
 Progress Note   Patient: Dan [name] FMW:[id number] DOB: Jan 06, 1946 DOA: 08/15/2024     1 DOS: the patient was seen and examined on 08/17/2024   Brief hospital admission narrative: [name] is a 78 y.o. male with medical history significant of history of asthma, BPH, prostate cancer, paroxysmal atrial fibrillation, depression/anxiety, COPD, hyperlipidemia and tobacco abuse; who presented to the hospital secondary to generalized weakness, abdominal pain and intermittent confusion. Patient's wife provided most of the history expressed that he had no being himself for the last 3 to 4 days; about a week ago he was brought to the hospital with concern for UTI and has by now completed antibiotic therapy.   No fever, no chills, no productive coughing spells, they have been associated decreased oral intake, some intermittent dry heaving and indigestion complaints.   Workup in the ED demonstrated no signs of infection in his urine or chest x-ray; patient found with acute kidney injury on chronic kidney disease, hyponatremia and hypercalcemia.  There is flat elevation in his troponin, but no chest pain and no acute ischemic changes on EKG.   Abdominal images demonstrating concern for ileus/early SBO.  CT head without contrast demonstrated no acute intracranial abnormalities.     IV fluids initiated and TRH contacted to place patient in the hospital for further evaluation and management.  Assessment and plan 1-acute metabolic encephalopathy -in the setting of hypercalcemia, hyponatremia and probably meds. -improved with fluid resuscitation and electrolyte repletion. -no acute infection; neg CT head. -will continue hydration and supportive care - Continue to minimize/avoid sedative agents. - On today's exam patient mentation essentially back to baseline.  2-abd pain/ileus -abd pain improved - Multiple bowel movement has been achieved after bowel regimen initiated - At this moment we will  maintain adequate hydration and advance diet - Planning to discharge home on Amitiza or the use of Linzess to continue maintaining adequate bowel regimen. -patient without any fever, reporting no nausea, no vomiting and expressing significant improvement in his ability to swallow and resolution of indigestion symptoms. -continue supportive care, antiemetics and as needed analgesics.  3-GERD - Patient reports significant improvement in his symptoms - Will continue PPI twice a day - Outpatient follow-up with GI service recommended.  4-AKI superimposed on CKD stage 2 -improved/resolved after IVF's -continue minimizing nephrotoxic agents. - Continue to follow renal function trend stability.  5-tobacco abuse -cessation counseling provided -continue Nicoderm  6-hx of COPD -stable -continue home bronchodilator management  7-hx of HTN and A. fib -continue cardizem  -not on chronic anticoagulation -will continue to hold losartan  in the setting of AKI and decreased oral intake. - Planning to resume at discharge.  8-hx of gout -continue allopurinol   9-hx of prostate cancer/hypercalcemia -continue zytiga  and prednisone   -continue outpatient follow up with urology/oncology service. -will check Ca trend and assess PTH -s/p pamidronate X 1 dose during hospitalization. -continue to maintain adequate hydration.   Subjective:  Patient reporting no nausea, no vomiting, significant improvement in his abdominal pain and is having bowel movements.  He wants diet to be advanced.  Physical Exam: Vitals:   08/17/24 0423 08/17/24 0811 08/17/24 0825 08/17/24 1225  BP: (!) 157/88  (!) 152/86 128/62  Pulse: 94   88  Resp: 18   16  Temp: 98.3 F (36.8 C)   98.5 F (36.9 C)  TempSrc: Oral   Oral  SpO2: 94% 95%  92%  Weight:      Height:       General exam:  Alert, awake, oriented x 3; improved and feeling better. Respiratory system: Clear to auscultation. Respiratory effort normal.  Good  saturation on room air. Cardiovascular system:RRR.  No rubs, no gallops, no JVD. Gastrointestinal system: Abdomen is nondistended, soft and nontender to palpation.  Positive bowel sounds. Central nervous system: No focal neurological deficits. Extremities: No cyanosis or clubbing. Skin: No petechiae. Psychiatry: Judgement and insight appear normal. Mood & affect appropriate.   Latest data Reviewed: CBC:WBC 13.4, hemoglobin 9.2 and platelet count 597K Magnesium : 2.2 BMET: sodium 138, potassium 4.0, chloride 98, bicarb 25, BUN 23, creatinine 1.2, calcium 10.6 and GFR 59  Family Communication: wife at bedside.  Disposition: Status is: Inpatient Remains inpatient appropriate because: IV therapy.  Anticipating discharge back home once medically stable.  Time spent: 50 minutes  Author: Eric Nunnery, MD 08/17/2024 3:59 PM  For on call review [email or web address].

## 2024-08-17 NOTE — Progress Notes (Signed)
 08/17/2024 12:36 PM -----------------------------------------------------------[facility]--------------------------------------------------- D(Data) A(Action) R(response)     Data: Noted HH PT orders needed prior to discharge. EDD listed for tomorrow 08/18/2024.    Action: EPIC secure chat sent to Attending MD    Response: MD acknowledged message. MD to place orders soon.     Srija Southard, RN [employer]

## 2024-08-17 NOTE — Progress Notes (Signed)
 Reached out to Asberry Peoples RN about Mr. Haslip's wife's concerns on wanting a private sitter overnight while he Is here in the hospital due to frequent bowel movements.

## 2024-08-17 NOTE — Plan of Care (Signed)

## 2024-08-17 NOTE — Progress Notes (Signed)
 Physical Therapy Treatment Patient Details Name: Dan [name] MRN: [id number] DOB: February 20, 1946 Today's Date: 08/17/2024   History of Present Illness Dan [name] is a 78 y.o. male with medical history significant of history of asthma, BPH, prostate cancer, paroxysmal atrial fibrillation, depression/anxiety, COPD, hyperlipidemia and tobacco abuse; who presented to the hospital secondary to generalized weakness, abdominal pain and intermittent confusion.  Patient's wife provided most of the history expressed that he had no being himself for the last 3 to 4 days; about a week ago he was brought to the hospital with concern for UTI and has by now completed antibiotic therapy.     No fever, no chills, no productive coughing spells, they have been associated decreased oral intake, some intermittent dry heaving and indigestion complaints.     Workup in the ED demonstrated no signs of infection in his urine or chest x-ray; patient found with acute kidney injury on chronic kidney disease, hyponatremia and hypercalcemia.  There is flat elevation in his troponin, but no chest pain and no acute ischemic changes on EKG.     Abdominal images demonstrating concern for ileus/early SBO.  CT head without contrast demonstrated no acute intracranial abnormalities.       IV fluids initiated and TRH contacted to place patient in the hospital for further evaluation and management.    PT Comments  Patient agreeable for therapy and c/o of frequent diarrhea today. Patient demonstrates fair/good return for completing BLE ROM/strengthening exercises while seated at bedside with no loss of balance, increased endurance/distance for gait training with slow labored movement without loss of balance an tolerated sitting up in chair after therapy with spouse present. Patient will benefit from continued skilled physical therapy in hospital and recommended venue below to increase strength, balance, endurance for safe ADLs and gait.     If  plan is discharge home, recommend the following: A little help with walking and/or transfers;A little help with bathing/dressing/bathroom;Assistance with cooking/housework;Assist for transportation   Can travel by private vehicle        Equipment Recommendations  None recommended by PT    Recommendations for Other Services       Precautions / Restrictions Precautions Precautions: Fall Recall of Precautions/Restrictions: Intact Restrictions Weight Bearing Restrictions Per Provider Order: No     Mobility  Bed Mobility Overal bed mobility: Needs Assistance       Supine to sit: Modified independent (Device/Increase time), Supervision     General bed mobility comments: slightly labored movement with good return for sitting up at bedside with HOB flat    Transfers Overall transfer level: Needs assistance Equipment used: Rolling walker (2 wheels) Transfers: Bed to chair/wheelchair/BSC, Sit to/from Stand Sit to Stand: Contact guard assist   Step pivot transfers: Contact guard assist       General transfer comment: increased time with labored movement    Ambulation/Gait Ambulation/Gait assistance: Contact guard assist Gait Distance (Feet): 30 Feet Assistive device: Rolling walker (2 wheels) Gait Pattern/deviations: Decreased step length - right, Decreased step length - left, Decreased stride length Gait velocity: decreased     General Gait Details: increased endurance/distance for ambulation with slow labored movement, no loss of balance, limited mostly due to c/o fatigue   Stairs             Wheelchair Mobility     Tilt Bed    Modified Rankin (Stroke Patients Only)       Balance Overall balance assessment: Needs assistance Sitting-balance support: Feet supported, No  upper extremity supported Sitting balance-Leahy Scale: Fair Sitting balance - Comments: fair/good seated at EOB   Standing balance support: Reliant on assistive device for balance,  During functional activity, Bilateral upper extremity supported Standing balance-Leahy Scale: Fair Standing balance comment: fair/good using RW                            Communication Communication Communication: No apparent difficulties  Cognition Arousal: Alert Behavior During Therapy: WFL for tasks assessed/performed                             Following commands: Intact      Cueing Cueing Techniques: Verbal cues  Exercises General Exercises - Lower Extremity Long Arc Quad: Seated, AROM, Strengthening, Both, 10 reps Hip Flexion/Marching: Seated, AROM, Strengthening, Both, 10 reps Toe Raises: Seated, AROM, Strengthening, Both, 10 reps Heel Raises: Seated, AROM, Strengthening, Both, 10 reps    General Comments        Pertinent Vitals/Pain Pain Assessment Pain Assessment: No/denies pain    Home Living                          Prior Function            PT Goals (current goals can now be found in the care plan section) Acute Rehab PT Goals Patient Stated Goal: return home with family to assist PT Goal Formulation: With patient/family Time For Goal Achievement: 08/26/24 Potential to Achieve Goals: Good Progress towards PT goals: Progressing toward goals    Frequency    Min 3X/week      PT Plan      Co-evaluation              AM-PAC PT 6 Clicks Mobility   Outcome Measure  Help needed turning from your back to your side while in a flat bed without using bedrails?: None Help needed moving from lying on your back to sitting on the side of a flat bed without using bedrails?: None Help needed moving to and from a bed to a chair (including a wheelchair)?: A Little Help needed standing up from a chair using your arms (e.g., wheelchair or bedside chair)?: A Little Help needed to walk in hospital room?: A Little Help needed climbing 3-5 steps with a railing? : A Lot 6 Click Score: 19    End of Session   Activity  Tolerance: Patient tolerated treatment well;Patient limited by fatigue Patient left: in chair;with call bell/phone within reach;with family/visitor present Nurse Communication: Mobility status PT Visit Diagnosis: Unsteadiness on feet (R26.81);Other abnormalities of gait and mobility (R26.89);Muscle weakness (generalized) (M62.81)     Time: 8549-8482 PT Time Calculation (min) (ACUTE ONLY): 27 min  Charges:    $Gait Training: 8-22 mins $Therapeutic Exercise: 8-22 mins PT General Charges $$ ACUTE PT VISIT: 1 Visit                     3:44 PM, 08/17/24 Lynwood Music, MPT Physical Therapist with [facility] [phone] office [phone] mobile phone

## 2024-08-17 NOTE — Plan of Care (Signed)
?  Problem: Education: ?Goal: Knowledge of General Education information will improve ?Description: Including pain rating scale, medication(s)/side effects and non-pharmacologic comfort measures ?Outcome: Progressing ?  ?Problem: Health Behavior/Discharge Planning: ?Goal: Ability to manage health-related needs will improve ?Outcome: Progressing ?  ?Problem: Clinical Measurements: ?Goal: Ability to maintain clinical measurements within normal limits will improve ?Outcome: Progressing ?Goal: Will remain free from infection ?Outcome: Progressing ?Goal: Diagnostic test results will improve ?Outcome: Progressing ?  ?Problem: Activity: ?Goal: Risk for activity intolerance will decrease ?Outcome: Progressing ?  ?Problem: Nutrition: ?Goal: Adequate nutrition will be maintained ?Outcome: Progressing ?  ?Problem: Elimination: ?Goal: Will not experience complications related to bowel motility ?Outcome: Progressing ?Goal: Will not experience complications related to urinary retention ?Outcome: Progressing ?  ?Problem: Safety: ?Goal: Ability to remain free from injury will improve ?Outcome: Progressing ?  ?Problem: Skin Integrity: ?Goal: Risk for impaired skin integrity will decrease ?Outcome: Progressing ?  ?

## 2024-08-18 ENCOUNTER — Other Ambulatory Visit (HOSPITAL_COMMUNITY): Payer: Self-pay

## 2024-08-18 ENCOUNTER — Telehealth (HOSPITAL_COMMUNITY): Payer: Self-pay | Admitting: Pharmacy Technician

## 2024-08-18 ENCOUNTER — Encounter (HOSPITAL_COMMUNITY): Payer: Self-pay

## 2024-08-18 ENCOUNTER — Ambulatory Visit: Payer: Self-pay | Admitting: Physical Therapy

## 2024-08-18 DIAGNOSIS — N179 Acute kidney failure, unspecified: Secondary | ICD-10-CM | POA: Diagnosis not present

## 2024-08-18 LAB — BASIC METABOLIC PANEL WITH GFR
Anion gap: 10 (ref 5–15)
BUN: 19 mg/dL (ref 8–23)
CO2: 18 mmol/L — ABNORMAL LOW (ref 22–32)
Calcium: 8.9 mg/dL (ref 8.9–10.3)
Chloride: 105 mmol/L (ref 98–111)
Creatinine, Ser: 0.97 mg/dL (ref 0.61–1.24)
GFR, Estimated: >60 mL/min (ref >60)
Glucose, Bld: 82 mg/dL (ref 70–99)
Potassium: 3.6 mmol/L (ref 3.5–5.1)
Sodium: 134 mmol/L — ABNORMAL LOW (ref 135–145)

## 2024-08-18 MED ORDER — NICOTINE 21 MG/24HR TD PT24: 21.0000 mg | MEDICATED_PATCH | Freq: Every day | TRANSDERMAL | 0 refills | Status: AC

## 2024-08-18 MED ORDER — RIVAROXABAN 20 MG PO TABS: 20.0000 mg | ORAL_TABLET | Freq: Every day | ORAL | 0 refills | Status: AC

## 2024-08-18 MED ORDER — DOCUSATE SODIUM 100 MG PO CAPS
100.0000 mg | ORAL_CAPSULE | Freq: Every day | ORAL | Status: DC
  Administered 2024-08-18: 100 mg via ORAL
  Filled 2024-08-18: qty 1

## 2024-08-18 MED ORDER — LINACLOTIDE 145 MCG PO CAPS: 145.0000 ug | ORAL_CAPSULE | Freq: Every day | ORAL | 1 refills | Status: AC

## 2024-08-18 MED ORDER — TORSEMIDE 20 MG PO TABS: 20.0000 mg | ORAL_TABLET | Freq: Every day | ORAL | 2 refills | Status: AC

## 2024-08-18 MED ORDER — PANTOPRAZOLE SODIUM 40 MG PO TBEC: 40.0000 mg | DELAYED_RELEASE_TABLET | Freq: Every day | ORAL | 2 refills | Status: AC

## 2024-08-18 MED ORDER — SENNOSIDES-DOCUSATE SODIUM 8.6-50 MG PO TABS
1.0000 | ORAL_TABLET | Freq: Every day | ORAL | 1 refills | Status: AC
Start: 2024-08-18 — End: 2024-10-17

## 2024-08-18 MED ORDER — LINACLOTIDE 145 MCG PO CAPS
145.0000 ug | ORAL_CAPSULE | Freq: Every day | ORAL | Status: DC
  Administered 2024-08-18: 145 ug via ORAL
  Filled 2024-08-18: qty 1

## 2024-08-18 NOTE — Telephone Encounter (Signed)
 Patient Product/process development scientist completed.    The patient is insured through [employer]/ [employer]. Patient has Medicare and is not eligible for a copay card, but may be able to apply for patient assistance or Medicare RX Payment Plan (Patient Must reach out to their plan, if eligible for payment plan), if available.    Ran test claim for Eliquis 5 mg and the current 30 day co-pay is $0.00  Ran test claim for Xarelto  20 mg and the current 30 day co-pay is $0.00.   This test claim was processed through [facility]- copay amounts may vary at other pharmacies due to pharmacy/plan contracts, or as the patient moves through the different stages of their insurance plan.     Reyes Sharps, CPHT Pharmacy Technician Patient Advocate Specialist Lead [facility] Patient Advocate Team Direct Number: [phone]  Fax: [phone]

## 2024-08-18 NOTE — Discharge Summary (Signed)
 Physician Discharge Summary   Patient: Dan [name] MRN: [id number] DOB: 1946-06-14  Admit date:     08/15/2024  Discharge date: 08/18/24  Discharge Physician: Adriana DELENA Grams   PCP: Toribio Jerel MATSU, MD   Recommendations at discharge:   Follow-up with PCP in 1 week, follow-up with cardiology 1 week Follow-up with cardiology as scheduled Continue daily weight Xarelto  on all the medication subject to changed by cardiology and PCP Follow-up with a gastroenterologist in 2-4 weeks - Follow-up with urology oncology as an outpatient  Discharge Diagnoses: Principal Problem:   AKI (acute kidney injury)  Dan [name] is a 78 y.o. male with medical history significant of history of asthma, BPH, prostate cancer, paroxysmal atrial fibrillation, depression/anxiety, COPD, hyperlipidemia and tobacco abuse; who presented to the hospital secondary to generalized weakness, abdominal pain and intermittent confusion. Patient's wife provided most of the history expressed that he had no being himself for the last 3 to 4 days; about a week ago he was brought to the hospital with concern for UTI and has by now completed antibiotic therapy.   No fever, no chills, no productive coughing spells, they have been associated decreased oral intake, some intermittent dry heaving and indigestion complaints.   Workup in the ED demonstrated no signs of infection in his urine or chest x-ray; patient found with acute kidney injury on chronic kidney disease, hyponatremia and hypercalcemia.  There is flat elevation in his troponin, but no chest pain and no acute ischemic changes on EKG.   Abdominal images demonstrating concern for ileus/early SBO.  CT head without contrast demonstrated no acute intracranial abnormalities.     IV fluids initiated and TRH contacted to place patient in the hospital for further evaluation and management.    1-acute metabolic encephalopathy -in the setting of hypercalcemia, hyponatremia and  probably meds. - Resolved  -no acute infection; neg CT head. -will continue hydration and supportive care - Continue to minimize/avoid sedative agents. - On today's exam patient mentation essentially back to baseline.   2-abd pain/ileus -Resolved - Multiple bowel movement has been achieved after bowel regimen initiated - At this moment we will maintain adequate hydration and advance diet - Planning to discharge home on Senokot, Linzess     3-GERD - Patient reports significant improvement in his symptoms - Continue PPI - Outpatient follow-up with GI service recommended.   4-AKI superimposed on CKD stage 2 -Improved with IVF -continue minimizing nephrotoxic agents.    5-tobacco abuse -cessation counseling provided -continue Nicoderm   6-hx of COPD -stable -continue home bronchodilator management   7-hx of HTN and A. fib -continue cardizem , restart home medication of Xarelto  -Restarted losartan  and Demadex at lower dose - Per patient was not taking his Xarelto  at home - Resuming meds   8-hx of gout -continue allopurinol    9-hx of prostate cancer/hypercalcemia -continue zytiga  and prednisone   -continue outpatient follow up with urology/oncology service.  -s/p pamidronate X 1 dose during hospitalization. -continue to maintain adequate hydration           Disposition: Home Diet recommendation:  Discharge Diet Orders (From admission, onward)     Start     Ordered   08/18/24 0000  Diet - low sodium heart healthy        08/18/24 1122           Cardiac and Carb modified diet DISCHARGE MEDICATION: Allergies as of 08/18/2024       Reactions   Tamsulosin Hives, Itching  Medication List     STOP taking these medications    methocarbamol  500 MG tablet Commonly known as: ROBAXIN    potassium chloride  10 MEQ CR capsule Commonly known as: MICRO-K    vitamin C 1000 MG tablet   Zinc 20 MG Caps       TAKE these medications     abiraterone  acetate 250 MG tablet Commonly known as: ZYTIGA  Take 4 tablets (1,000 mg total) by mouth daily. Take on an empty stomach 1 hour before or 2 hours after a meal   allopurinol  100 MG tablet Commonly known as: ZYLOPRIM  Take 100 mg by mouth daily.   Breztri  Aerosphere 160-9-4.8 MCG/ACT Aero inhaler Generic drug: budesonide-glycopyrrolate-formoterol Inhale 2 puffs into the lungs in the morning and at bedtime.   cholecalciferol 25 MCG (1000 UNIT) tablet Commonly known as: VITAMIN D3 Take 1,000 Units by mouth daily.   diltiazem  360 MG 24 hr capsule Commonly known as: CARDIZEM  CD Take 360 mg by mouth daily.   DULoxetine 30 MG capsule Commonly known as: CYMBALTA Take 30 mg by mouth daily.   linaclotide 145 MCG Caps capsule Commonly known as: LINZESS Take 1 capsule (145 mcg total) by mouth daily before breakfast. Start taking on: August 19, 2024   losartan  25 MG tablet Commonly known as: COZAAR  TAKE 1 TABLET (25 MG TOTAL) BY MOUTH DAILY.   magnesium  oxide 400 (240 Mg) MG tablet Commonly known as: MAG-OX TAKE 1 TABLET BY MOUTH EVERY DAY   megestrol  40 MG/ML suspension Commonly known as: MEGACE  Take 400 mg by mouth daily as needed (appetite).   methylphenidate  5 MG tablet Commonly known as: Ritalin  Take 1 tablet (5 mg total) by mouth 2 (two) times daily as needed. Take 1 tab with breakfast and 1 tab with lunch as needed   nicotine 21 mg/24hr patch Commonly known as: NICODERM CQ - dosed in mg/24 hours Place 1 patch (21 mg total) onto the skin daily. Start taking on: August 19, 2024   pantoprazole 40 MG tablet Commonly known as: PROTONIX Take 1 tablet (40 mg total) by mouth daily. Start taking on: August 19, 2024   polyethylene glycol 17 g packet Commonly known as: MIRALAX  / GLYCOLAX  Take 17 g by mouth 2 (two) times daily.   predniSONE  5 MG tablet Commonly known as: DELTASONE  Take 1 tablet (5 mg total) by mouth daily with breakfast.   rivaroxaban  20  MG Tabs tablet Commonly known as: XARELTO  Take 1 tablet (20 mg total) by mouth daily with supper. What changed:  medication strength how much to take when to take this   senna-docusate 8.6-50 MG tablet Commonly known as: Senokot-S Take 1 tablet by mouth at bedtime.   torsemide 20 MG tablet Commonly known as: DEMADEX Take 1 tablet (20 mg total) by mouth daily. What changed: how much to take        Follow-up Information     [facility] Follow up.   Specialty: Home Health Services Why: Hedda will follow up with you 24-48 hours post discharge to provide home health physical therapy Contact information: [address] [place] [address] [phone]                Discharge Exam: Filed Weights   08/15/24 1017 08/15/24 1455  Weight: 63.5 kg 59.4 kg        General:  AAO x 3,  cooperative, no distress;   HEENT:  Normocephalic, PERRL, otherwise with in Normal limits   Neuro:  CNII-XII  intact. , normal motor and sensation, reflexes intact   Lungs:   Clear to auscultation BL, Respirations unlabored,  No wheezes / crackles  Cardio:    S1/S2, RRR, No murmure, No Rubs or Gallops   Abdomen:  Soft, non-tender, bowel sounds active all four quadrants, no guarding or peritoneal signs.  Muscular  skeletal:  Limited exam -global generalized weaknesses - in bed, able to move all 4 extremities,   2+ pulses,  symmetric, No pitting edema  Skin:  Dry, warm to touch, negative for any Rashes,  Wounds: Please see nursing documentation          Condition at discharge: good  The results of significant diagnostics from this hospitalization (including imaging, microbiology, ancillary and laboratory) are listed below for reference.   Imaging Studies: DG Abd 2 Views Result Date: 08/17/2024 CLINICAL DATA:  [id number] [facility]) [id number] EXAM: ABDOMEN - 2 VIEW COMPARISON:  August 15, 2024 FINDINGS: Generalized paucity of small bowel gas in the central  abdomen. Diffuse gaseous dilation of the transverse colon measuring up to 8.3 cm. Minimal gas in the rectum.No pneumoperitoneum. No organomegaly or radiopaque calculi. Peripherally calcified right renal cyst again noted. No acute fracture or destructive lesion. Bilateral hip arthroplasties. The lung bases are clear. IMPRESSION: Diffuse gaseous dilation of the transverse colon measuring up to 8.3 cm. Minimal rectal gas present, suggesting that this may reflect changes of an adynamic ileus. Alternatively, colonic pseudo-obstruction ([place]gilvie syndrome) could have this appearance. While a colonic mass was not visualized on the prior noncontrast CT, a partial obstruction from underlying colonic mass could also have this appearance. Electronically Signed   By: Rogelia Myers M.D.   On: 08/17/2024 09:17   CT Renal Stone Study Result Date: 08/15/2024 CLINICAL DATA:  Abdominal/flank pain, stone suspected Abd/flank pain, stone suspected BIB [facility] from home for complaint of Abdominal pain, and generalized weakness. Informed EMS he was seen on 10/3 and dx with a UTI and taking medication for it. Hx of Afib, COPD, and prostrated Cancer EXAM: CT ABDOMEN AND PELVIS WITHOUT CONTRAST TECHNIQUE: Multidetector CT imaging of the abdomen and pelvis was performed following the standard protocol without IV contrast. RADIATION DOSE REDUCTION: This exam was performed according to the departmental dose-optimization program which includes automated exposure control, adjustment of the mA and/or kV according to patient size and/or use of iterative reconstruction technique. COMPARISON:  X-ray lumbar spine 07/28/2024 FINDINGS: Lower chest: Bronchial wall thickening.  No acute abnormality. Hepatobiliary: No focal liver abnormality. No gallstones, gallbladder wall thickening, or pericholecystic fluid. No biliary dilatation. Pancreas: No focal lesion. Normal pancreatic contour. No surrounding inflammatory changes. No main pancreatic ductal  dilatation. Spleen: Normal in size without focal abnormality. Adrenals/Urinary Tract: No adrenal nodule bilaterally. No nephrolithiasis and no hydronephrosis. Peripherally calcified 6 cm right renal fluid lesion. No ureterolithiasis or hydroureter. The urinary bladder is unremarkable. Stomach/Bowel: Stomach is distended with fluid. No evidence of bowel wall thickening or dilatation. Diffuse small bowel dilatation with fluid measuring up to 4 cm in caliber. No transition point identified. Stool throughout the cecum and ascending colon, gaseous distension of the transverse colon with the remainder of the colon decompressed. Appendix appears normal. Vascular/Lymphatic: Borderline enlarged infrarenal abdominal aorta measuring up to 3 cm. Severe atherosclerotic plaque of the aorta and its branches. No abdominal, pelvic, or inguinal lymphadenopathy. Reproductive: Prostate is not visualized limited evaluation of the pelvis due to streak artifact originating from bilateral femoral surgical hardware. Other: Nonspecific trace volume intraperitoneal simple free pelvic fluid. No intraperitoneal  free gas. No organized fluid collection. Musculoskeletal: No abdominal wall hernia or abnormality. No suspicious lytic or blastic osseous lesions. No acute displaced fracture. Multilevel severe degenerative changes of the spine. Chronic grossly stable L1 compression fracture. Intervertebral disc space vacuum phenomenon at the L4-L5 L5-S1 levels with associated endplate sclerosis. Mild retrolisthesis of L4 on L5. Bilateral hip total arthroplasty. IMPRESSION: 1. Diffuse small bowel dilatation with fluid measuring up to 4 cm. No definite transition point. Question developing/early small bowel obstruction. Consider small-bowel follow-through with PO contrast administration to evaluate migration of the PO contrast. 2. Aneurysmal infrarenal abdominal aorta (3 cm). Recommend follow-up ultrasound every 3 years. (Ref.: J Vasc Surg. 2018; 67:2-77  and J Am Coll Radiol 2013;10(10):789-794.) 3.  Aortic Atherosclerosis (ICD10-I70.0). Electronically Signed   By: Morgane  Naveau M.D.   On: 08/15/2024 12:22   CT Head Wo Contrast Result Date: 08/15/2024 CLINICAL DATA:  Mental status change, unknown cause EXAM: CT HEAD WITHOUT CONTRAST TECHNIQUE: Contiguous axial images were obtained from the base of the skull through the vertex without intravenous contrast. RADIATION DOSE REDUCTION: This exam was performed according to the departmental dose-optimization program which includes automated exposure control, adjustment of the mA and/or kV according to patient size and/or use of iterative reconstruction technique. COMPARISON:  CT head 08/06/2024 FINDINGS: Brain: Cerebral ventricle sizes are concordant with the degree of cerebral volume loss. Patchy and confluent areas of decreased attenuation are noted throughout the deep and periventricular white matter of the cerebral hemispheres bilaterally, compatible with chronic microvascular ischemic disease. No evidence of large-territorial acute infarction. No parenchymal hemorrhage. No mass lesion. No extra-axial collection. No mass effect or midline shift. No hydrocephalus. Basilar cisterns are patent. Vascular: No hyperdense vessel. Skull: No acute fracture or focal lesion. Sinuses/Orbits: Paranasal sinuses and mastoid air cells are clear. Right lens replacement. Otherwise the orbits are unremarkable. Other: None. IMPRESSION: No acute intracranial abnormality. Electronically Signed   By: Morgane  Naveau M.D.   On: 08/15/2024 12:06   DG Chest Port 1 View Result Date: 08/15/2024 CLINICAL DATA:  Cough EXAM: PORTABLE CHEST 1 VIEW COMPARISON:  Chest radiograph dated 08/06/2024 FINDINGS: Normal lung volumes. No focal consolidations. No pleural effusion or pneumothorax. The heart size and mediastinal contours are within normal limits. No acute osseous abnormality. IMPRESSION: No active disease. Electronically Signed   By:  Limin  Xu M.D.   On: 08/15/2024 11:26   CT PELVIS WO CONTRAST Result Date: 08/06/2024 CLINICAL DATA:  Hip trauma fall EXAM: CT PELVIS WITHOUT CONTRAST TECHNIQUE: Multidetector CT imaging of the pelvis was performed following the standard protocol without intravenous contrast. RADIATION DOSE REDUCTION: This exam was performed according to the departmental dose-optimization program which includes automated exposure control, adjustment of the mA and/or kV according to patient size and/or use of iterative reconstruction technique. COMPARISON:  08/06/2024, PET CT 12/19/2022 FINDINGS: Urinary Tract: Bladder is largely obscured by artifact from hip hardware. Bowel:  No obvious bowel wall thickening. Vascular/Lymphatic: Advanced aortic atherosclerosis. No obvious aneurysm. No suspicious nodes. Reproductive:  Obscured by artifact Other: No significant pelvic effusion. Partially visualized rim calcified cyst in the right kidney, previously characterized as [place] 2 on prior imaging. Musculoskeletal: Bilateral hip replacements demonstrate normal alignment. Artifact limits osseous detail. Bones appear osteopenic. No definitive acute fracture seen allowing for artifact. Chronic L1 compression deformity IMPRESSION: 1. Bilateral hip replacements with artifact limiting osseous detail. No definitive acute fracture is seen allowing for artifact. 2. Aortic atherosclerosis. Aortic Atherosclerosis (ICD10-I70.0). Electronically Signed   By: Luke Scott HERO.D.  On: 08/06/2024 16:29   CT Head Wo Contrast Result Date: 08/06/2024 CLINICAL DATA:  Fall yesterday EXAM: CT HEAD WITHOUT CONTRAST TECHNIQUE: Contiguous axial images were obtained from the base of the skull through the vertex without intravenous contrast. RADIATION DOSE REDUCTION: This exam was performed according to the departmental dose-optimization program which includes automated exposure control, adjustment of the mA and/or kV according to patient size and/or use of  iterative reconstruction technique. COMPARISON:  MRI 06/11/2024 FINDINGS: Brain: No acute territorial infarction, hemorrhage or intracranial mass. Moderate atrophy and chronic small vessel ischemic changes of the white matter. Stable ventricle size Vascular: No hyperdense vessels.  Carotid vascular calcification Skull: Normal. Negative for fracture or focal lesion. Sinuses/Orbits: No acute finding. Other: None IMPRESSION: 1. No CT evidence for acute intracranial abnormality. 2. Atrophy and chronic small vessel ischemic changes of the white matter. Electronically Signed   By: Luke Bun M.D.   On: 08/06/2024 16:19   DG Hip Unilat With Pelvis 2-3 Views Left Result Date: 08/06/2024 CLINICAL DATA:  Left hip pain following a fall yesterday. EXAM: DG HIP (WITH OR WITHOUT PELVIS) 2-3V LEFT COMPARISON:  08/05/2024 FINDINGS: Stable bilateral hip prostheses. Old, healed proximal femoral metadiaphyseal fracture on the left. No acute fracture or dislocation seen. Diffuse osteopenia. Atheromatous arterial calcifications. Lower lumbar spine degenerative changes. IMPRESSION: 1. No acute fracture or dislocation. 2. Stable bilateral hip prostheses. 3. Old, healed proximal left femoral fracture. Electronically Signed   By: Elspeth Bathe M.D.   On: 08/06/2024 13:18   DG Chest 1 View Result Date: 08/06/2024 CLINICAL DATA:  Back pain following a fall yesterday. EXAM: CHEST  1 VIEW COMPARISON:  08/05/2024 FINDINGS: Normal-sized heart. Tortuous and partially calcified thoracic aorta. Clear lungs with normal vascularity. Stable mild-to-moderate chronic peribronchial thickening. Diffuse osteopenia. Old, healed left rib fractures. No acute fracture or pneumothorax seen. Bilateral shoulder degenerative changes. IMPRESSION: 1. No acute abnormality. 2. Stable mild to moderate chronic bronchitic changes. Electronically Signed   By: Elspeth Bathe M.D.   On: 08/06/2024 13:16   DG Lumbar Spine Complete Result Date: 07/28/2024 Clinical:   lower back pain with left sided paresthesias, history of prostate cancer, no trauma, bilateral hip prosthesis X-rays were done of the lumbar spine, five views. There is normal lumbar lordosis present.  There is marked narrowing at L5-S1 and some narrowing at L4-L5.  No fracture is present.  Distal aorta arterioslerosis is present. Bone quality is good.  Bilateral hip prosthesis. Impression:  Degenerative changes of lumbar spine more at L4-L5 and L5-S1, no acute findings. Electronically Signed Lemond Stable, MD 9/24/202510:14 AM  DG HIP UNILAT W OR W/O PELVIS 2-3 VIEWS LEFT Result Date: 07/28/2024 Clinical:  pain left hip and left lower back, no trauma, history of prostate cancer X-rays were done of the pelvis and left hip, three views. He has a total hip on the right and a bipolar hip on the left with no fracture or signs of loosening.  No lesion is noted in the bone around the prosthesis on either side.  Bone quality is good. Impression:  post bilateral hip surgery with prosthetic replacements, no acute findings. Electronically Signed Lemond Stable, MD 9/24/202510:11 AM   Microbiology: Results for orders placed or performed during the hospital encounter of 08/15/24  Resp panel by RT-PCR (RSV, Flu A&B, Covid) Anterior Nasal Swab     Status: None   Collection Time: 08/15/24 10:31 AM   Specimen: Anterior Nasal Swab  Result Value Ref Range Status   SARS Coronavirus  2 by RT PCR NEGATIVE NEGATIVE Final    Comment: (NOTE) SARS-CoV-2 target nucleic acids are NOT DETECTED.  The SARS-CoV-2 RNA is generally detectable in upper respiratory specimens during the acute phase of infection. The lowest concentration of SARS-CoV-2 viral copies this assay can detect is 138 copies/mL. A negative result does not preclude SARS-Cov-2 infection and should not be used as the sole basis for treatment or other patient management decisions. A negative result may occur with  improper specimen collection/handling,  submission of specimen other than nasopharyngeal swab, presence of viral mutation(s) within the areas targeted by this assay, and inadequate number of viral copies(<138 copies/mL). A negative result must be combined with clinical observations, patient history, and epidemiological information. The expected result is Negative.  Fact Sheet for Patients:  BloggerCourse.com  Fact Sheet for Healthcare Providers:  SeriousBroker.it  This test is no t yet approved or cleared by the [place]  FDA and  has been authorized for detection and/or diagnosis of SARS-CoV-2 by FDA under an Emergency Use Authorization (EUA). This EUA will remain  in effect (meaning this test can be used) for the duration of the COVID-19 declaration under Section 564(b)(1) of the Act, 21 U.S.C.section 360bbb-3(b)(1), unless the authorization is terminated  or revoked sooner.       Influenza A by PCR NEGATIVE NEGATIVE Final   Influenza B by PCR NEGATIVE NEGATIVE Final    Comment: (NOTE) The Xpert Xpress SARS-CoV-2/FLU/RSV plus assay is intended as an aid in the diagnosis of influenza from Nasopharyngeal swab specimens and should not be used as a sole basis for treatment. Nasal washings and aspirates are unacceptable for Xpert Xpress SARS-CoV-2/FLU/RSV testing.  Fact Sheet for Patients: BloggerCourse.com  Fact Sheet for Healthcare Providers: SeriousBroker.it  This test is not yet approved or cleared by the [place]  FDA and has been authorized for detection and/or diagnosis of SARS-CoV-2 by FDA under an Emergency Use Authorization (EUA). This EUA will remain in effect (meaning this test can be used) for the duration of the COVID-19 declaration under Section 564(b)(1) of the Act, 21 U.S.C. section 360bbb-3(b)(1), unless the authorization is terminated or revoked.     Resp Syncytial Virus by PCR NEGATIVE  NEGATIVE Final    Comment: (NOTE) Fact Sheet for Patients: BloggerCourse.com  Fact Sheet for Healthcare Providers: SeriousBroker.it  This test is not yet approved or cleared by the [place]  FDA and has been authorized for detection and/or diagnosis of SARS-CoV-2 by FDA under an Emergency Use Authorization (EUA). This EUA will remain in effect (meaning this test can be used) for the duration of the COVID-19 declaration under Section 564(b)(1) of the Act, 21 U.S.C. section 360bbb-3(b)(1), unless the authorization is terminated or revoked.  Performed at [facility], [address], [place], [place] [address]     Labs: CBC: Recent Labs  Lab 08/15/24 1031 08/16/24 0310 08/17/24 0444  WBC 14.1* 12.4* 13.4*  NEUTROABS 11.1*  --   --   HGB 10.0* 8.4* 9.2*  HCT 29.9* 25.2* 27.7*  MCV 88.7 89.0 89.6  PLT 641* 554* 597*   Basic Metabolic Panel: Recent Labs  Lab 08/15/24 1031 08/15/24 1321 08/16/24 0650 08/17/24 0444 08/18/24 0451  NA 127*  --  131* 138 134*  K 4.1  --  3.7 4.0 3.6  CL 88*  --  97* 98 105  CO2 24  --  23 25 18*  GLUCOSE 104*  --  99 89 82  BUN 38*  --  26* 23 19  CREATININE 2.19*  --  1.21 1.26* 0.97  CALCIUM 11.6*  --  10.8* 10.6* 8.9  MG 2.2 2.0  --  2.2  --   PHOS  --  3.3  --   --   --    Liver Function Tests: Recent Labs  Lab 08/15/24 1031  AST 15  ALT 11  ALKPHOS 89  BILITOT 0.4  PROT 6.9  ALBUMIN  4.2   CBG: No results for input(s): GLUCAP in the last 168 hours.  Discharge time spent: greater than 30 minutes.  Signed: Adriana DELENA Grams, MD [facility] 08/18/2024

## 2024-08-18 NOTE — Plan of Care (Signed)
  Problem: Education: Goal: Knowledge of General Education information will improve Description: Including pain rating scale, medication(s)/side effects and non-pharmacologic comfort measures Outcome: Adequate for Discharge   Problem: Health Behavior/Discharge Planning: Goal: Ability to manage health-related needs will improve Outcome: Adequate for Discharge   Problem: Clinical Measurements: Goal: Ability to maintain clinical measurements within normal limits will improve Outcome: Adequate for Discharge Goal: Will remain free from infection Outcome: Adequate for Discharge Goal: Diagnostic test results will improve Outcome: Adequate for Discharge Goal: Respiratory complications will improve Outcome: Adequate for Discharge Goal: Cardiovascular complication will be avoided Outcome: Adequate for Discharge   Problem: Activity: Goal: Risk for activity intolerance will decrease Outcome: Adequate for Discharge   Problem: Nutrition: Goal: Adequate nutrition will be maintained Outcome: Adequate for Discharge   Problem: Elimination: Goal: Will not experience complications related to bowel motility Outcome: Adequate for Discharge Goal: Will not experience complications related to urinary retention Outcome: Adequate for Discharge   Problem: Pain Managment: Goal: General experience of comfort will improve and/or be controlled Outcome: Adequate for Discharge   Problem: Safety: Goal: Ability to remain free from injury will improve Outcome: Adequate for Discharge   Problem: Skin Integrity: Goal: Risk for impaired skin integrity will decrease Outcome: Adequate for Discharge   Problem: Acute Rehab PT Goals(only PT should resolve) Goal: Pt Will Go Supine/Side To Sit Outcome: Adequate for Discharge Goal: Patient Will Transfer Sit To/From Stand Outcome: Adequate for Discharge Goal: Pt Will Transfer Bed To Chair/Chair To Bed Outcome: Adequate for Discharge Goal: Pt Will  Ambulate Outcome: Adequate for Discharge

## 2024-08-18 NOTE — TOC Transition Note (Signed)
 Transition of Care [facility]) - Discharge Note   Patient Details  Name: Dan [name] MRN: [id number] Date of Birth: 08/03/1946  Transition of Care [facility]) CM/SW Contact:  Sharlyne Stabs, RN Phone Number: 08/18/2024, 11:42 AM   Clinical Narrative:   CM called to the bedside.  Patient and wife is ready to discharge home with home health. They have assistance for transport. Per [facility] home health was set up last week but patient came to the hospital before they could make their first visit. They want [place], PT from [place], and need new orders. MD placed orders, [facility] and [facility] updated.     Final next level of care: Home w Home Health Services Barriers to Discharge: Barriers Resolved   Patient Goals and CMS Choice Patient states their goals for this hospitalization and ongoing recovery are:: To feel better CMS Medicare.gov Compare Post Acute Care list provided to:: Patient Choice offered to / list presented to : Patient      Discharge Placement                  Name of family member notified: Wife Patient and family notified of of transfer: 08/18/24  Discharge Plan and Services Additional resources added to the After Visit Summary for   In-house Referral: Clinical Social Work Discharge Planning Services: NA Post Acute Care Choice: Home Health          DME Arranged: N/A DME Agency: NA       HH Arranged: PT HH Agency: [facility] Date [facility] Agency Contacted: 08/18/24 Time HH Agency Contacted: 1142 Representative spoke with at [facility] Agency: [facility]  Social Drivers of Health (SDOH) Interventions SDOH Screenings   Food Insecurity: No Food Insecurity (08/15/2024)  Housing: Low Risk  (08/15/2024)  Transportation Needs: No Transportation Needs (08/15/2024)  Utilities: Not At Risk (08/15/2024)  Depression (PHQ2-9): Low Risk  (05/06/2024)  Social Connections: Socially Integrated (08/15/2024)  Tobacco Use: High Risk (08/06/2024)     Readmission Risk  Interventions    08/16/2024   11:20 AM  Readmission Risk Prevention Plan  Transportation Screening Complete  PCP or Specialist Appt within 3-5 Days Complete  HRI or Home Care Consult Complete  Social Work Consult for Recovery Care Planning/Counseling Complete  Palliative Care Screening Not Applicable  Medication Review [employer]) Complete

## 2024-08-18 NOTE — Care Management Important Message (Signed)
 Important Message  Patient Details  Name: Dan [name] MRN: [id number] Date of Birth: 1946/10/27   Important Message Given:  N/A - LOS <3 / Initial given by admissions     Dan [name] [name] 08/18/2024, 10:09 AM

## 2024-08-18 NOTE — Plan of Care (Signed)

## 2024-08-19 ENCOUNTER — Telehealth: Payer: Self-pay | Admitting: Internal Medicine

## 2024-08-19 ENCOUNTER — Other Ambulatory Visit: Payer: Self-pay | Admitting: Cardiology

## 2024-08-19 ENCOUNTER — Telehealth: Payer: Self-pay | Admitting: *Deleted

## 2024-08-19 ENCOUNTER — Other Ambulatory Visit: Payer: Self-pay

## 2024-08-19 ENCOUNTER — Ambulatory Visit (HOSPITAL_COMMUNITY): Admission: RE | Admit: 2024-08-19 | Source: Ambulatory Visit

## 2024-08-19 ENCOUNTER — Encounter: Payer: Self-pay | Admitting: Oncology

## 2024-08-19 NOTE — Telephone Encounter (Signed)
 Spoke with wife Rollo on [place]) and she states the PFT appointment for today 08/19/24 was cancelled due to Mr. Hollick being in the hospital.  She states they will call back to reschedule when he is feeling better

## 2024-08-19 NOTE — Transitions of Care (Post Inpatient/ED Visit) (Signed)
 08/19/2024  Name: Dan [name] MRN: [id number] DOB: 05/14/1946  Today's TOC FU Call Status: Today's TOC FU Call Status:: Successful TOC FU Call Completed TOC FU Call Complete Date: 08/19/24 Patient's Name and Date of Birth confirmed.  Transition Care Management Follow-up Telephone Call Date of Discharge: 08/18/24 [facility] Type of Discharge: Inpatient Admission Primary Inpatient Discharge Diagnosis:: AKI How have you been since you were released from the hospital?:  (appetite has improved per spouse, had bowel movement yesterday, PT is working with pt, using cane and walker) Any questions or concerns?: No  Items Reviewed: Did you receive and understand the discharge instructions provided?: Yes Medications obtained,verified, and reconciled?: Yes (Medications Reviewed) Any new allergies since your discharge?: No Dietary orders reviewed?: Yes Type of Diet Ordered:: low sodium   heart healthy Do you have support at home?: Yes People in Home [RPT]: spouse Name of Support/Comfort Primary Source: Gina Sardina  Medications Reviewed Today: Medications Reviewed Today     Reviewed by Aura Mliss LABOR, RN (Registered Nurse) on 08/19/24 at 1538  Med List Status: <None>   Medication Order Taking? Sig Documenting Provider Last Dose Status Informant  abiraterone  acetate (ZYTIGA ) 250 MG tablet [id number] Yes Take 4 tablets (1,000 mg total) by mouth daily. Take on an empty stomach 1 hour before or 2 hours after a meal Katragadda, Sreedhar, MD  Active Self           Med Note ATLEE, CRYSTAL S   Mon Aug 16, 2024  8:49 AM)    allopurinol  (ZYLOPRIM ) 100 MG tablet [id number] Yes Take 100 mg by mouth daily. [provider]  Active Self  budesonide-glycopyrrolate-formoterol (BREZTRI  AEROSPHERE) 160-9-4.8 MCG/ACT AERO inhaler [id number]  Inhale 2 puffs into the lungs in the morning and at bedtime.  Patient not taking: Reported on 08/19/2024   Darlean Ozell NOVAK, MD  Active             Med Note ATLEE, CRYSTAL S   Mon Aug 16, 2024  8:57 AM) Pt changed to [facility] per wife  cholecalciferol (VITAMIN D3) 25 MCG (1000 UNIT) tablet [id number]  Take 1,000 Units by mouth daily.  Patient not taking: Reported on 08/19/2024   [provider]  Active Self  degarelix  (FIRMAGON ) injection 80 mg [id number]    Patient taking differently: Inject 80 mg into the skin every 28 (twenty-eight) days.   Watt Rush, MD  Active            Med Note ATLEE, CRYSTAL S   Mon Aug 16, 2024  8:56 AM) This medication is due on Tuesday 08/17/2024 per wife  diltiazem  (CARDIZEM  CD) 360 MG 24 hr capsule [id number] Yes Take 360 mg by mouth daily. [provider]  Active Self  DULoxetine (CYMBALTA) 30 MG capsule [id number] Yes Take 30 mg by mouth daily. [provider]  Active   linaclotide LARUE) 145 MCG CAPS capsule [id number] Yes Take 1 capsule (145 mcg total) by mouth daily before breakfast. Shahmehdi, Seyed A, MD  Active   losartan  (COZAAR ) 25 MG tablet [id number] Yes TAKE 1 TABLET (25 MG TOTAL) BY MOUTH DAILY. Alvan Dorn FALCON, MD  Active   magnesium  oxide (MAG-OX) 400 (240 Mg) MG tablet [id number]  TAKE 1 TABLET BY MOUTH EVERY DAY  Patient not taking: Reported on 08/19/2024   Rogers Hai, MD  Active Self           Med Note ATLEE CAMELIA GORMAN Pablo Aug 16, 2024  8:53  AM) Pt not taking per wife  megestrol  (MEGACE ) 40 MG/ML suspension [id number]  Take 400 mg by mouth daily as needed (appetite).  Patient not taking: Reported on 08/19/2024   [provider]  Active Self  methylphenidate  (RITALIN ) 5 MG tablet [id number]  Take 1 tablet (5 mg total) by mouth 2 (two) times daily as needed. Take 1 tab with breakfast and 1 tab with lunch as needed  Patient not taking: Reported on 08/19/2024   Kandala, Hyndavi, MD  Active   nicotine (NICODERM CQ - DOSED IN MG/24 HOURS) 21 mg/24hr patch [id number]  Place 1 patch (21 mg total) onto the skin daily.  Patient  not taking: Reported on 08/19/2024   Shahmehdi, Seyed A, MD  Active   pantoprazole (PROTONIX) 40 MG tablet [id number] Yes Take 1 tablet (40 mg total) by mouth daily. Willette Adriana LABOR, MD  Active   polyethylene glycol (MIRALAX  / GLYCOLAX ) 17 g packet [id number] Yes Take 17 g by mouth 2 (two) times daily. Patti Rosina SAUNDERS, PA-C  Active            Med Note ATLEE, CRYSTAL S   Mon Aug 16, 2024  8:53 AM) Pt not taking per wife, refusing.  predniSONE  (DELTASONE ) 5 MG tablet [id number] Yes Take 1 tablet (5 mg total) by mouth daily with breakfast. Rogers Hai, MD  Active Self  rivaroxaban  (XARELTO ) 20 MG TABS tablet [id number] Yes Take 1 tablet (20 mg total) by mouth daily with supper. Willette Adriana LABOR, MD  Active   senna-docusate (SENOKOT-S) 8.6-50 MG tablet [id number] Yes Take 1 tablet by mouth at bedtime. Willette Adriana LABOR, MD  Active   torsemide (DEMADEX) 20 MG tablet [id number] Yes Take 1 tablet (20 mg total) by mouth daily. Willette Adriana LABOR, MD  Active   Med List Note Teretha Renaee SAILOR, RPH-CPP 01/14/23 9050): Zytiga  filled at [facility] (Specialty)            Home Care and Equipment/Supplies: Were Home Health Services Ordered?: Yes Name of Home Health Agency:: [phone] Has Agency set up a time to come to your home?: Yes First Home Health Visit Date: 08/19/24 Any new equipment or medical supplies ordered?: No  Functional Questionnaire: Do you need assistance with bathing/showering or dressing?: Yes (spouse assists as needed) Do you need assistance with meal preparation?: Yes (spouse assists) Do you need assistance with eating?: No Do you have difficulty maintaining continence: No Do you need assistance with getting out of bed/getting out of a chair/moving?: Yes (walker, cane) Do you have difficulty managing or taking your medications?: Yes (spouse assists)  Follow up appointments reviewed: PCP Follow-up appointment confirmed?: Yes Date of PCP follow-up  appointment?: 08/27/24 Follow-up Provider: Jerel Sieving MD Specialist Hospital Follow-up appointment confirmed?: No (cardiology, spouse is calling) Reason Specialist Follow-Up Not Confirmed: Patient has Specialist Provider Number and will Call for Appointment Do you need transportation to your follow-up appointment?: No Do you understand care options if your condition(s) worsen?: Yes-patient verbalized understanding  SDOH Interventions Today    Flowsheet Row Most Recent Value  SDOH Interventions   Food Insecurity Interventions Intervention Not Indicated  Housing Interventions Intervention Not Indicated  Transportation Interventions Intervention Not Indicated  Utilities Interventions Intervention Not Indicated    Goals Addressed             This Visit's Progress    VBCI Transitions of Care (TOC) Care Plan       Problems:  Recent Hospitalization for treatment of AKI No Specialist  appointment spouse is calling to schedule cardiology appointment and plans to talk with primary care provider about referral to GI Per spouse pt is not interested in smoking cessation, is not using nicoderm patch Pt is working with home health PT  Goal:  Over the next 30 days, the patient will not experience hospital readmission  Interventions:  Transitions of Care: Doctor Visits  - discussed the importance of doctor visits Reviewed Signs and symptoms of infection Evaluation of current treatment plan related to AKI,  self-management and patient's adherence to plan as established by provider. Discussed plans with patient for ongoing care management follow up and provided patient with direct contact information for care management team Evaluation of current treatment plan related to AKI and patient's adherence to plan as established by provider Reviewed medications with patient and discussed importance of taking as prescribed Reviewed scheduled/upcoming provider appointments including 08/27/24 primary  care provider Discussed plans with patient for ongoing care management follow up and provided patient with direct contact information for care management team Screening for signs and symptoms of depression related to chronic disease state  Assessed social determinant of health barriers Reviewed safety precautions   Patient Self Care Activities:  Attend all scheduled provider appointments Call pharmacy for medication refills 3-7 days in advance of running out of medications Call provider office for new concerns or questions  Notify RN Care Manager of TOC call rescheduling needs Participate in Transition of Care Program/Attend TOC scheduled calls Take medications as prescribed   Stay well hydrated, drinking preferably water Continue working with PT and complete prescribed exercises  Plan:  Telephone follow up appointment with care management team member scheduled for:  08/26/24 @ 315 pm The patient has been provided with contact information for the care management team and has been advised to call with any health related questions or concerns.         Mliss Creed [facility], BSN RN Care Manager/ Transition of Care [place]/ [facility] [phone]

## 2024-08-20 ENCOUNTER — Encounter

## 2024-08-20 ENCOUNTER — Other Ambulatory Visit: Payer: Self-pay

## 2024-08-20 MED ORDER — ABIRATERONE ACETATE 250 MG PO TABS
1000.0000 mg | ORAL_TABLET | Freq: Every day | ORAL | 3 refills | Status: AC
  Filled 2024-08-20 (×2): qty 120, 30d supply, fill #0
  Filled 2024-09-15 – 2024-10-19 (×3): qty 120, 30d supply, fill #1

## 2024-08-20 NOTE — Telephone Encounter (Signed)
Chart reviewed. Zytiga refilled per last office note with Dr. Delton Coombes

## 2024-08-20 NOTE — Progress Notes (Signed)
 Specialty Pharmacy Refill Coordination Note  Dan [name] is a 78 y.o. male contacted today regarding refills of specialty medication(s) Abiraterone  Acetate (ZYTIGA )   Patient requested Delivery   Delivery date: 08/24/24   Verified address: [address]  [place]  [address]   Medication will be filled on 08/23/24.

## 2024-08-23 ENCOUNTER — Other Ambulatory Visit: Payer: Self-pay

## 2024-08-24 ENCOUNTER — Ambulatory Visit

## 2024-08-24 DIAGNOSIS — C61 Malignant neoplasm of prostate: Secondary | ICD-10-CM | POA: Diagnosis not present

## 2024-08-24 MED ORDER — DEGARELIX ACETATE 80 MG ~~LOC~~ SOLR
80.0000 mg | Freq: Once | SUBCUTANEOUS | Status: AC
  Administered 2024-08-24: 80 mg via SUBCUTANEOUS

## 2024-08-24 NOTE — Progress Notes (Cosign Needed Addendum)
 Firmagon  Sub Q Injection  Due to Prostate Cancer patient is present today for monthly Firmagon  Injection.  Order received and reviewed and authorization verification reviewed.   Medication: Firmagon  (Degarelix )  Dose: 80 mg Location: left upper abdomen cleaned and prepped with alcohol  prior to injection Lot: [id number] Exp: [id number]  Patient tolerated well, no complications were noted Band aid applied over injection site.   Performed by: Carlos, CMA  Follow up: 1 month for injection. Appointment scheduled with patient.

## 2024-08-26 ENCOUNTER — Telehealth: Payer: Self-pay | Admitting: *Deleted

## 2024-08-26 ENCOUNTER — Encounter: Payer: Self-pay | Admitting: *Deleted

## 2024-08-26 DIAGNOSIS — H25812 Combined forms of age-related cataract, left eye: Secondary | ICD-10-CM | POA: Diagnosis not present

## 2024-08-26 DIAGNOSIS — Z961 Presence of intraocular lens: Secondary | ICD-10-CM | POA: Diagnosis not present

## 2024-08-27 ENCOUNTER — Encounter: Payer: Self-pay | Admitting: *Deleted

## 2024-08-27 ENCOUNTER — Encounter: Payer: Self-pay | Admitting: Oncology

## 2024-08-27 DIAGNOSIS — M353 Polymyalgia rheumatica: Secondary | ICD-10-CM | POA: Diagnosis not present

## 2024-08-27 DIAGNOSIS — Z6820 Body mass index (BMI) 20.0-20.9, adult: Secondary | ICD-10-CM | POA: Diagnosis not present

## 2024-08-27 DIAGNOSIS — F17210 Nicotine dependence, cigarettes, uncomplicated: Secondary | ICD-10-CM | POA: Diagnosis not present

## 2024-08-27 DIAGNOSIS — E871 Hypo-osmolality and hyponatremia: Secondary | ICD-10-CM | POA: Diagnosis not present

## 2024-08-27 DIAGNOSIS — J449 Chronic obstructive pulmonary disease, unspecified: Secondary | ICD-10-CM | POA: Diagnosis not present

## 2024-08-27 DIAGNOSIS — I10 Essential (primary) hypertension: Secondary | ICD-10-CM | POA: Diagnosis not present

## 2024-08-27 DIAGNOSIS — C774 Secondary and unspecified malignant neoplasm of inguinal and lower limb lymph nodes: Secondary | ICD-10-CM | POA: Diagnosis not present

## 2024-08-27 DIAGNOSIS — I739 Peripheral vascular disease, unspecified: Secondary | ICD-10-CM | POA: Diagnosis not present

## 2024-08-27 DIAGNOSIS — G609 Hereditary and idiopathic neuropathy, unspecified: Secondary | ICD-10-CM | POA: Diagnosis not present

## 2024-08-27 DIAGNOSIS — C61 Malignant neoplasm of prostate: Secondary | ICD-10-CM | POA: Diagnosis not present

## 2024-08-27 DIAGNOSIS — I4891 Unspecified atrial fibrillation: Secondary | ICD-10-CM | POA: Diagnosis not present

## 2024-09-06 ENCOUNTER — Encounter: Payer: Self-pay | Admitting: Radiology

## 2024-09-08 ENCOUNTER — Inpatient Hospital Stay: Attending: Oncology | Admitting: Oncology

## 2024-09-08 ENCOUNTER — Inpatient Hospital Stay

## 2024-09-08 VITALS — BP 126/70 | HR 80 | Temp 97.6°F | Resp 20 | Wt 139.5 lb

## 2024-09-08 DIAGNOSIS — D72829 Elevated white blood cell count, unspecified: Secondary | ICD-10-CM | POA: Insufficient documentation

## 2024-09-08 DIAGNOSIS — M8008XA Age-related osteoporosis with current pathological fracture, vertebra(e), initial encounter for fracture: Secondary | ICD-10-CM | POA: Diagnosis not present

## 2024-09-08 DIAGNOSIS — C775 Secondary and unspecified malignant neoplasm of intrapelvic lymph nodes: Secondary | ICD-10-CM | POA: Diagnosis not present

## 2024-09-08 DIAGNOSIS — F17210 Nicotine dependence, cigarettes, uncomplicated: Secondary | ICD-10-CM | POA: Diagnosis not present

## 2024-09-08 DIAGNOSIS — M810 Age-related osteoporosis without current pathological fracture: Secondary | ICD-10-CM

## 2024-09-08 DIAGNOSIS — D508 Other iron deficiency anemias: Secondary | ICD-10-CM

## 2024-09-08 DIAGNOSIS — Z7952 Long term (current) use of systemic steroids: Secondary | ICD-10-CM | POA: Insufficient documentation

## 2024-09-08 DIAGNOSIS — E611 Iron deficiency: Secondary | ICD-10-CM | POA: Diagnosis not present

## 2024-09-08 DIAGNOSIS — D75839 Thrombocytosis, unspecified: Secondary | ICD-10-CM | POA: Diagnosis not present

## 2024-09-08 DIAGNOSIS — C61 Malignant neoplasm of prostate: Secondary | ICD-10-CM | POA: Insufficient documentation

## 2024-09-08 DIAGNOSIS — C772 Secondary and unspecified malignant neoplasm of intra-abdominal lymph nodes: Secondary | ICD-10-CM | POA: Diagnosis not present

## 2024-09-08 DIAGNOSIS — R221 Localized swelling, mass and lump, neck: Secondary | ICD-10-CM | POA: Insufficient documentation

## 2024-09-08 LAB — COMPREHENSIVE METABOLIC PANEL WITH GFR
ALT: 7 U/L (ref 0–44)
AST: 13 U/L — ABNORMAL LOW (ref 15–41)
Albumin: 4.3 g/dL (ref 3.5–5.0)
Alkaline Phosphatase: 99 U/L (ref 38–126)
Anion gap: 14 (ref 5–15)
BUN: 21 mg/dL (ref 8–23)
CO2: 19 mmol/L — ABNORMAL LOW (ref 22–32)
Calcium: 10.5 mg/dL — ABNORMAL HIGH (ref 8.9–10.3)
Chloride: 100 mmol/L (ref 98–111)
Creatinine, Ser: 0.98 mg/dL (ref 0.61–1.24)
GFR, Estimated: >60 mL/min (ref >60)
Glucose, Bld: 112 mg/dL — ABNORMAL HIGH (ref 70–99)
Potassium: 4.4 mmol/L (ref 3.5–5.1)
Sodium: 133 mmol/L — ABNORMAL LOW (ref 135–145)
Total Bilirubin: 0.3 mg/dL (ref 0.0–1.2)
Total Protein: 7.1 g/dL (ref 6.5–8.1)

## 2024-09-08 LAB — IRON AND TIBC
Iron: 64 ug/dL (ref 45–182)
Saturation Ratios: 21 % (ref 17.9–39.5)
TIBC: 300 ug/dL (ref 250–450)
UIBC: 236 ug/dL

## 2024-09-08 LAB — CBC WITH DIFFERENTIAL/PLATELET
Abs Immature Granulocytes: 0.05 K/uL (ref 0.00–0.07)
Basophils Absolute: 0.1 K/uL (ref 0.0–0.1)
Basophils Relative: 0 %
Eosinophils Absolute: 0.1 K/uL (ref 0.0–0.5)
Eosinophils Relative: 1 %
HCT: 31.1 % — ABNORMAL LOW (ref 39.0–52.0)
Hemoglobin: 9.8 g/dL — ABNORMAL LOW (ref 13.0–17.0)
Immature Granulocytes: 0 %
Lymphocytes Relative: 8 %
Lymphs Abs: 1.1 K/uL (ref 0.7–4.0)
MCH: 29.2 pg (ref 26.0–34.0)
MCHC: 31.5 g/dL (ref 30.0–36.0)
MCV: 92.6 fL (ref 80.0–100.0)
Monocytes Absolute: 0.7 K/uL (ref 0.1–1.0)
Monocytes Relative: 5 %
Neutro Abs: 11.3 K/uL — ABNORMAL HIGH (ref 1.7–7.7)
Neutrophils Relative %: 86 %
Platelets: 521 K/uL — ABNORMAL HIGH (ref 150–400)
RBC: 3.36 MIL/uL — ABNORMAL LOW (ref 4.22–5.81)
RDW: 16.6 % — ABNORMAL HIGH (ref 11.5–15.5)
WBC: 13.3 K/uL — ABNORMAL HIGH (ref 4.0–10.5)
nRBC: 0 % (ref 0.0–0.2)

## 2024-09-08 LAB — FERRITIN: Ferritin: 590 ng/mL — ABNORMAL HIGH (ref 24–336)

## 2024-09-08 LAB — PSA: Prostatic Specific Antigen: <0.02 ng/mL (ref 0.00–4.00)

## 2024-09-08 NOTE — Progress Notes (Signed)
 [facility] [address], [place] [address]    Clinic Day:  09/12/2024  Referring physician: Toribio Jerel MATSU, MD  Patient Care Team: Toribio Jerel MATSU, MD as PCP - General (Family Medicine) Alvan Dorn FALCON, MD as PCP - Cardiology (Cardiology) Shaaron Lamar HERO, MD as Consulting Physician (Gastroenterology) Celestia Joesph SQUIBB, RN as Oncology Nurse Navigator (Medical Oncology) Darlean Ozell NOVAK, MD as Consulting Physician (Pulmonary Disease)   ASSESSMENT & PLAN:   Assessment: 1.  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 Hollis on 09/22/2018 - Final pathology: PT3b N0, Gleason 4+3 prostate adenocarcinoma, postoperative PSA remained detectable and rose to 5.7 - Axumin PET (06/2019): Local recurrence - Salvage XRT to the prostate fossa and pelvic lymph nodes, Dr. Harper from 07/05/2019 - 08/31/2019, without concurrent ADT.  PSA responded well reaching a nadir of 0.1. - PSA rose again to 3.4 in 06/2022, PSMA PET scan: Oligometastatic disease involving single internal iliac lymph node. - 07/29/2022 - 08/09/2022: 10 fx course of UHRT to right internal iliac node, PSA only decreased slightly to 3.1 in 10/2022, further went up to 5 on 12/05/2022 - PSMA PET (12/19/2022): New radiotracer avid 2 mm infrarenal retrocaval lymph node and a minimal increase size of the previously treated right common iliac lymph node measuring 3 mm compared to 2 mm previously, no evidence of recurrence in the prostatectomy bed or bone mets. - Loading dose of Firmagon  with Dr. Watt on 01/09/2023 - Abiraterone  and prednisone  started on 01/18/2023. - XRT to the retrocaval lymph node and right common iliac lymph node completed on 01/29/2023, 10 fractions   2.  Social/family history: - He lives with wife at home.  He is independent of all ADLs and IADLs.  Uses cane for longer distances.  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  Current active smoker, 1 pack/day for 45 years. - Father had lung cancer and was smoker.    Plan: 1.  Metastatic CSPC to abdominal/high pelvic lymph node (low-volume): - He is continuing Firmagon  injections and abiraterone  500 mg daily with prednisone . - He is HOH.  - Patient had MRI of brain on 06/16/2024 which showed no acute intracranial abnormality or intracranial metastatic disease.  Multifocal hyperdense T2 weighted signal within the cerebral white matter most commonly due to chronic small vessel disease.  Mild volume loss. -follow-up with us  in 3 months.  Continue abiraterone  on Firmagon .   2.  Iron  deficiency state: - Last infusion of INFeD  on 05/06/2024.  Ferritin improved to elevated at 590 (514).  Hemoglobin 9.8 (9.2). -Iron  infusion significantly helped his energy levels, iron  saturations 21%.  The prefer this closer to 30% in the setting of CKD although ferritin appears elevated.  Would recommend 1 dose of iron  preferably per patient request towards the end of the year.  Will get this scheduled for December.      3.  Mild hypercalcemia: - He has mild intermittent hypercalcemia.  He takes vitamin D 1000 units daily.   4.  Osteoporosis (DEXA on 03/17/2023 T score -4): - Received 1 dose of Prolia  on 04/03/2023 and decided not to continue it. -Patient was having left hip and left lower back pain.  No trauma.  Had imaging of hip, back which did not reveal any acute findings. Degenerative changes of lumbar spine more in L4-5 and L5-S1.  No acute changes. -Patient  fell again on 08/05/2024 and went to the emergency room.  Imaging did not reveal any acute fracture.  He also had CT of his head and pelvis which were negative for acute fracture or abnormality.  5.  Right neck mass: - He has noticed right neck swelling for the past 3 months. 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  No cervical adenopathy. - He has seen Dr. Carlie and decided to watch and wait approach.   6.  JAK2 V617F/BCR-ABL negative leukocytosis and thrombocytosis: - He has intermittent leukocytosis and thrombocytosis, likely reactive from smoking and prednisone .    Orders Placed This Encounter  Procedures   Comprehensive metabolic panel with GFR    Standing Status:   Future    Expected Date:   12/12/2024    Expiration Date:   03/12/2025    Release to patient:   Immediate   CBC with Differential/Platelet    Standing Status:   Future    Expected Date:   12/12/2024    Expiration Date:   03/12/2025    Release to patient:   Immediate   Ferritin    Standing Status:   Future    Expected Date:   12/12/2024    Expiration Date:   03/12/2025    Release to patient:   Immediate   PSA    Standing Status:   Future    Expected Date:   12/12/2024    Expiration Date:   03/12/2025    Release to patient:   Immediate   Iron  and TIBC ([facility] ONLY)    Standing Status:   Future    Expected Date:   12/12/2024    Expiration Date:   03/12/2025       Delon FORBES Hope, NP   11/9/20251:03 PM  CHIEF COMPLAINT:   Diagnosis: 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 RALP 09/22/18 (Dr. Hollis) 2. Salvage XRT + ADT 07/05/2019 - 08/31/2019 (Dr. Harper) 3. UHRT to right internal iliac node 07/29/22 - 08/09/22 4. UHRT to infrarenal retrocaval lymph node 01/16/23 - 01/29/23  Current Therapy:  Firmagon  and Abiraterone     HISTORY OF PRESENT ILLNESS:   Oncology History   No history exists.     INTERVAL HISTORY:   Enes is a 78 y.o. male presenting to clinic today for follow up of Metastatic castrate sensitive  prostate cancer to the lymph nodes.   Today, he states that he is doing well overall. His appetite level is at 100%. His energy level is at 25%. He is accompanied by his wife.   Since his last visit, patient had imaging of right hip due to new hip pain which was essentially unremarkable.  Patient then fell on 08/06/2024 and was evaluated in the emergency room with imaging which was essentially unremarkable.  He presented back on 08/15/2024 for cough and abdominal pain and generalized weakness concerning for kidney stone.  Imaging showed small bowel dilatation with fluid question developing early small bowel obstruction.  Abdominal x-ray on 08/17/2024 which showed diffuse gaseous dilatation of the transverse colon measuring up to 8.3 cm.  Minimal rectal gas present suggesting that this may reflect  changes of an adynamic ileus.  Alternatively colonic pseudoobstruction could have this appearance.  He did not require surgery and symptoms resolved spontaneously.  Overall, patient is doing well. Caleb takes Vitamin D 5000 units daily. He is taking Zytiga , prednisone , and ritalin  as prescribed.   PAST MEDICAL HISTORY:   Past Medical History: Past Medical History:  Diagnosis Date   Anemia    Arthritis    Asthma    BPH (benign prostatic hyperplasia)    COPD (chronic obstructive pulmonary disease) (HCC)    Depression    ED (erectile dysfunction)    Gout    Hyperlipidemia    Hypertension    Loss of hearing    Nocturia    Paroxysmal atrial fibrillation (HCC)    Prostate cancer [facility])     Surgical History: Past Surgical History:  Procedure Laterality Date   CARDIOVASCULAR STRESS TEST  12/31/2004   normal perfuction nuclear study w/ no evidence ischemia /  normal LV function and wall motion , ef 52%   PROSTATECTOMY  2019   TOTAL HIP ARTHROPLASTY Left    TOTAL HIP ARTHROPLASTY Right 01/20/2024   Procedure: ARTHROPLASTY, HIP, TOTAL, ANTERIOR APPROACH;  Surgeon: Ernie Cough, MD;  Location: [facility];   Service: Orthopedics;  Laterality: Right;    Social History: Social History   Socioeconomic History   Marital status: Married    Spouse name: Not on file   Number of children: Not on file   Years of education: Not on file   Highest education level: Not on file  Occupational History   Not on file  Tobacco Use   Smoking status: Every Day    Current packs/day: 0.50    Average packs/day: 0.5 packs/day for 57.3 years (28.6 ttl pk-yrs)    Types: Cigarettes    Start date: 06/12/1967    Passive exposure: Never   Smokeless tobacco: Never   Tobacco comments:    Smoking 1/2 ppd.  Trying to stop.  05/17/2024 hfb RN  Vaping Use   Vaping status: Never Used  Substance and Sexual Activity   Alcohol  use: Not Currently   Drug use: Not Currently   Sexual activity: Not Currently  Other Topics Concern   Not on file  Social History Narrative   Not on file   Social Drivers of Health   Financial Resource Strain: Not on file  Food Insecurity: No Food Insecurity (08/19/2024)   Hunger Vital Sign    Worried About Running Out of Food in the Last Year: Never true    Ran Out of Food in the Last Year: Never true  Transportation Needs: No Transportation Needs (08/19/2024)   PRAPARE - Administrator, Civil Service (Medical): No    Lack of Transportation (Non-Medical): No  Physical Activity: Not on file  Stress: Not on file  Social Connections: Socially Integrated (08/15/2024)   Social Connection and Isolation Panel    Frequency of Communication with Friends and Family: Twice a week    Frequency of Social Gatherings with Friends and Family: Twice a week    Attends Religious Services: 1 to 4 times per year    Active Member of [employer] or Organizations: No    Attends Engineer, Structural: 1 to 4 times per year    Marital Status: Married  Catering Manager Violence: Patient Unable To Answer (08/19/2024)   Humiliation, Afraid, Rape, and Kick questionnaire    Fear of Current or  Ex-Partner: Patient unable to answer    Emotionally  Abused: Patient unable to answer    Physically Abused: Patient unable to answer    Sexually Abused: Patient unable to answer    Family History: Family History  Problem Relation Age of Onset   Hypertension Mother    Cancer Father        lung   Colon cancer Neg Hx    Inflammatory bowel disease Neg Hx    Pancreatic cancer Neg Hx     Current Medications:  Current Outpatient Medications:    abiraterone  acetate (ZYTIGA ) 250 MG tablet, Take 4 tablets (1,000 mg total) by mouth daily. Take on an empty stomach 1 hour before or 2 hours after a meal, Disp: 120 tablet, Rfl: 3   allopurinol  (ZYLOPRIM ) 100 MG tablet, Take 100 mg by mouth daily., Disp: , Rfl:    budesonide-glycopyrrolate-formoterol (BREZTRI  AEROSPHERE) 160-9-4.8 MCG/ACT AERO inhaler, Inhale 2 puffs into the lungs in the morning and at bedtime., Disp: , Rfl:    cholecalciferol (VITAMIN D3) 25 MCG (1000 UNIT) tablet, Take 1,000 Units by mouth daily., Disp: , Rfl:    diltiazem  (CARDIZEM  CD) 360 MG 24 hr capsule, Take 360 mg by mouth daily., Disp: , Rfl:    DULoxetine (CYMBALTA) 30 MG capsule, Take 30 mg by mouth daily., Disp: , Rfl:    linaclotide (LINZESS) 145 MCG CAPS capsule, Take 1 capsule (145 mcg total) by mouth daily before breakfast., Disp: 30 capsule, Rfl: 1   losartan  (COZAAR ) 25 MG tablet, TAKE 1 TABLET (25 MG TOTAL) BY MOUTH DAILY., Disp: 15 tablet, Rfl: 0   magnesium  oxide (MAG-OX) 400 (240 Mg) MG tablet, TAKE 1 TABLET BY MOUTH EVERY DAY, Disp: 90 tablet, Rfl: 1   megestrol  (MEGACE ) 40 MG/ML suspension, Take 400 mg by mouth daily as needed (appetite)., Disp: , Rfl:    methylphenidate  (RITALIN ) 5 MG tablet, Take 1 tablet (5 mg total) by mouth 2 (two) times daily as needed. Take 1 tab with breakfast and 1 tab with lunch as needed, Disp: 60 tablet, Rfl: 0   mupirocin ointment (BACTROBAN) 2 %, Apply topically daily., Disp: , Rfl:    nicotine (NICODERM CQ - DOSED IN MG/24  HOURS) 21 mg/24hr patch, Place 1 patch (21 mg total) onto the skin daily., Disp: 28 patch, Rfl: 0   pantoprazole (PROTONIX) 40 MG tablet, Take 1 tablet (40 mg total) by mouth daily., Disp: 30 tablet, Rfl: 2   polyethylene glycol (MIRALAX  / GLYCOLAX ) 17 g packet, Take 17 g by mouth 2 (two) times daily., Disp: 14 each, Rfl: 0   predniSONE  (DELTASONE ) 5 MG tablet, Take 1 tablet (5 mg total) by mouth daily with breakfast., Disp: 90 tablet, Rfl: 1   rivaroxaban  (XARELTO ) 20 MG TABS tablet, Take 1 tablet (20 mg total) by mouth daily with supper., Disp: 30 tablet, Rfl: 0   senna-docusate (SENOKOT-S) 8.6-50 MG tablet, Take 1 tablet by mouth at bedtime., Disp: 30 tablet, Rfl: 1   torsemide (DEMADEX) 20 MG tablet, Take 1 tablet (20 mg total) by mouth daily., Disp: 30 tablet, Rfl: 2   TRELEGY ELLIPTA 200-62.5-25 MCG/ACT AEPB, Inhale 1 puff into the lungs daily., Disp: , Rfl:    Zinc 50 MG TABS, 1 tablet Orally Once a day; Duration: 30 day(s), Disp: , Rfl:   Current Facility-Administered Medications:    degarelix  (FIRMAGON ) injection 80 mg, 80 mg, Subcutaneous, Q28 days, Wrenn, John, MD, 80 mg at 07/16/24 1108   Allergies: Allergies  Allergen Reactions   Tamsulosin Hives and Itching    REVIEW OF  SYSTEMS:   Review of Systems  Constitutional:  Positive for fatigue.  Respiratory:  Positive for shortness of breath.   Musculoskeletal:  Positive for arthralgias, back pain and gait problem.  Neurological:  Positive for gait problem.  Hematological:  Positive for adenopathy.     VITALS:   Blood pressure 126/70, pulse 80, temperature 97.6 F (36.4 C), temperature source Oral, resp. rate 20, weight 139 lb 8 oz (63.3 kg), SpO2 100%.  Wt Readings from Last 3 Encounters:  09/08/24 139 lb 8 oz (63.3 kg)  08/15/24 130 lb 15.3 oz (59.4 kg)  08/06/24 140 lb (63.5 kg)    Body mass index is 21.85 kg/m.  Performance status (ECOG): 1 - Symptomatic but completely ambulatory  PHYSICAL EXAM:   Physical  Exam Constitutional:      Appearance: Normal appearance.  Cardiovascular:     Rate and Rhythm: Normal rate and regular rhythm.  Pulmonary:     Effort: Pulmonary effort is normal.     Breath sounds: Normal breath sounds.  Abdominal:     General: Bowel sounds are normal.     Palpations: Abdomen is soft.  Musculoskeletal:        General: No swelling. Normal range of motion.  Neurological:     Mental Status: He is alert and oriented to person, place, and time. Mental status is at baseline.     LABS:      Latest Ref Rng & Units 09/08/2024    9:25 AM 08/17/2024    4:44 AM 08/16/2024    3:10 AM  CBC  WBC 4.0 - 10.5 K/uL 13.3  13.4  12.4   Hemoglobin 13.0 - 17.0 g/dL 9.8  9.2  8.4   Hematocrit 39.0 - 52.0 % 31.1  27.7  25.2   Platelets 150 - 400 K/uL 521  597  554       Latest Ref Rng & Units 09/08/2024    9:25 AM 08/18/2024    4:51 AM 08/17/2024    4:44 AM  CMP  Glucose 70 - 99 mg/dL 887  82  89   BUN 8 - 23 mg/dL 21  19  23    Creatinine 0.61 - 1.24 mg/dL 9.01  9.02  8.73   Sodium 135 - 145 mmol/L 133  134  138   Potassium 3.5 - 5.1 mmol/L 4.4  3.6  4.0   Chloride 98 - 111 mmol/L 100  105  98   CO2 22 - 32 mmol/L 19  18  25    Calcium 8.9 - 10.3 mg/dL 89.4  8.9  89.3   Total Protein 6.5 - 8.1 g/dL 7.1     Total Bilirubin 0.0 - 1.2 mg/dL 0.3     Alkaline Phos 38 - 126 U/L 99     AST 15 - 41 U/L 13     ALT 0 - 44 U/L 7        No results found for: CEA1, CEA / No results found for: CEA1, CEA Lab Results  Component Value Date   PSA1 <0.1 08/14/2023   No results found for: CAN199 No results found for: CAN125  No results found for: STEPHANY CARLOTA BENSON MARKEL EARLA JOANNIE, GAMS, MSPIKE, SPEI Lab Results  Component Value Date   TIBC 300 09/08/2024   TIBC 293 06/01/2024   TIBC 324 05/04/2024   FERRITIN 590 (H) 09/08/2024   FERRITIN 514 (H) 06/01/2024   FERRITIN 131 05/04/2024   IRONPCTSAT 21 09/08/2024   IRONPCTSAT 29  06/01/2024  IRONPCTSAT 15 (L) 05/04/2024   Lab Results  Component Value Date   LDH 99 12/03/2023     STUDIES:   DG Abd 2 Views Result Date: 08/17/2024 CLINICAL DATA:  [id number] Ileus [facility]) [id number] EXAM: ABDOMEN - 2 VIEW COMPARISON:  August 15, 2024 FINDINGS: Generalized paucity of small bowel gas in the central abdomen. Diffuse gaseous dilation of the transverse colon measuring up to 8.3 cm. Minimal gas in the rectum.No pneumoperitoneum. No organomegaly or radiopaque calculi. Peripherally calcified right renal cyst again noted. No acute fracture or destructive lesion. Bilateral hip arthroplasties. The lung bases are clear. IMPRESSION: Diffuse gaseous dilation of the transverse colon measuring up to 8.3 cm. Minimal rectal gas present, suggesting that this may reflect changes of an adynamic ileus. Alternatively, colonic pseudo-obstruction (Ogilvie syndrome) could have this appearance. While a colonic mass was not visualized on the prior noncontrast CT, a partial obstruction from underlying colonic mass could also have this appearance. Electronically Signed   By: Rogelia Myers M.D.   On: 08/17/2024 09:17   CT Renal Stone Study Result Date: 08/15/2024 CLINICAL DATA:  Abdominal/flank pain, stone suspected Abd/flank pain, stone suspected BIB [facility] from home for complaint of Abdominal pain, and generalized weakness. Informed EMS he was seen on 10/3 and dx with a UTI and taking medication for it. Hx of Afib, COPD, and prostrated Cancer EXAM: CT ABDOMEN AND PELVIS WITHOUT CONTRAST TECHNIQUE: Multidetector CT imaging of the abdomen and pelvis was performed following the standard protocol without IV contrast. RADIATION DOSE REDUCTION: This exam was performed according to the departmental dose-optimization program which includes automated exposure control, adjustment of the mA and/or kV according to patient size and/or use of iterative reconstruction technique. COMPARISON:  X-ray lumbar spine 07/28/2024 FINDINGS:  Lower chest: Bronchial wall thickening.  No acute abnormality. Hepatobiliary: No focal liver abnormality. No gallstones, gallbladder wall thickening, or pericholecystic fluid. No biliary dilatation. Pancreas: No focal lesion. Normal pancreatic contour. No surrounding inflammatory changes. No main pancreatic ductal dilatation. Spleen: Normal in size without focal abnormality. Adrenals/Urinary Tract: No adrenal nodule bilaterally. No nephrolithiasis and no hydronephrosis. Peripherally calcified 6 cm right renal fluid lesion. No ureterolithiasis or hydroureter. The urinary bladder is unremarkable. Stomach/Bowel: Stomach is distended with fluid. No evidence of bowel wall thickening or dilatation. Diffuse small bowel dilatation with fluid measuring up to 4 cm in caliber. No transition point identified. Stool throughout the cecum and ascending colon, gaseous distension of the transverse colon with the remainder of the colon decompressed. Appendix appears normal. Vascular/Lymphatic: Borderline enlarged infrarenal abdominal aorta measuring up to 3 cm. Severe atherosclerotic plaque of the aorta and its branches. No abdominal, pelvic, or inguinal lymphadenopathy. Reproductive: Prostate is not visualized limited evaluation of the pelvis due to streak artifact originating from bilateral femoral surgical hardware. Other: Nonspecific trace volume intraperitoneal simple free pelvic fluid. No intraperitoneal free gas. No organized fluid collection. Musculoskeletal: No abdominal wall hernia or abnormality. No suspicious lytic or blastic osseous lesions. No acute displaced fracture. Multilevel severe degenerative changes of the spine. Chronic grossly stable L1 compression fracture. Intervertebral disc space vacuum phenomenon at the L4-L5 L5-S1 levels with associated endplate sclerosis. Mild retrolisthesis of L4 on L5. Bilateral hip total arthroplasty. IMPRESSION: 1. Diffuse small bowel dilatation with fluid measuring up to 4 cm. No  definite transition point. Question developing/early small bowel obstruction. Consider small-bowel follow-through with PO contrast administration to evaluate migration of the PO contrast. 2. Aneurysmal infrarenal abdominal aorta (3 cm). Recommend follow-up ultrasound every 3 years. (Ref.: J  Vasc Surg. 2018; 67:2-77 and J Am Coll Radiol 2013;10(10):789-794.) 3.  Aortic Atherosclerosis (ICD10-I70.0). Electronically Signed   By: Morgane  Naveau M.D.   On: 08/15/2024 12:22   CT Head Wo Contrast Result Date: 08/15/2024 CLINICAL DATA:  Mental status change, unknown cause EXAM: CT HEAD WITHOUT CONTRAST TECHNIQUE: Contiguous axial images were obtained from the base of the skull through the vertex without intravenous contrast. RADIATION DOSE REDUCTION: This exam was performed according to the departmental dose-optimization program which includes automated exposure control, adjustment of the mA and/or kV according to patient size and/or use of iterative reconstruction technique. COMPARISON:  CT head 08/06/2024 FINDINGS: Brain: Cerebral ventricle sizes are concordant with the degree of cerebral volume loss. Patchy and confluent areas of decreased attenuation are noted throughout the deep and periventricular white matter of the cerebral hemispheres bilaterally, compatible with chronic microvascular ischemic disease. No evidence of large-territorial acute infarction. No parenchymal hemorrhage. No mass lesion. No extra-axial collection. No mass effect or midline shift. No hydrocephalus. Basilar cisterns are patent. Vascular: No hyperdense vessel. Skull: No acute fracture or focal lesion. Sinuses/Orbits: Paranasal sinuses and mastoid air cells are clear. Right lens replacement. Otherwise the orbits are unremarkable. Other: None. IMPRESSION: No acute intracranial abnormality. Electronically Signed   By: Morgane  Naveau M.D.   On: 08/15/2024 12:06   DG Chest Port 1 View Result Date: 08/15/2024 CLINICAL DATA:  Cough EXAM:  PORTABLE CHEST 1 VIEW COMPARISON:  Chest radiograph dated 08/06/2024 FINDINGS: Normal lung volumes. No focal consolidations. No pleural effusion or pneumothorax. The heart size and mediastinal contours are within normal limits. No acute osseous abnormality. IMPRESSION: No active disease. Electronically Signed   By: Limin  Xu M.D.   On: 08/15/2024 11:26

## 2024-09-11 ENCOUNTER — Encounter: Payer: Self-pay | Admitting: Oncology

## 2024-09-12 ENCOUNTER — Encounter: Payer: Self-pay | Admitting: Oncology

## 2024-09-14 ENCOUNTER — Other Ambulatory Visit: Payer: Self-pay | Admitting: Cardiology

## 2024-09-15 ENCOUNTER — Other Ambulatory Visit: Payer: Self-pay

## 2024-09-17 ENCOUNTER — Other Ambulatory Visit: Payer: Self-pay

## 2024-09-17 DIAGNOSIS — G609 Hereditary and idiopathic neuropathy, unspecified: Secondary | ICD-10-CM | POA: Diagnosis not present

## 2024-09-17 DIAGNOSIS — C774 Secondary and unspecified malignant neoplasm of inguinal and lower limb lymph nodes: Secondary | ICD-10-CM | POA: Diagnosis not present

## 2024-09-17 DIAGNOSIS — C61 Malignant neoplasm of prostate: Secondary | ICD-10-CM | POA: Diagnosis not present

## 2024-09-17 DIAGNOSIS — I4891 Unspecified atrial fibrillation: Secondary | ICD-10-CM | POA: Diagnosis not present

## 2024-09-17 DIAGNOSIS — M353 Polymyalgia rheumatica: Secondary | ICD-10-CM | POA: Diagnosis not present

## 2024-09-17 DIAGNOSIS — I10 Essential (primary) hypertension: Secondary | ICD-10-CM | POA: Diagnosis not present

## 2024-09-17 DIAGNOSIS — E871 Hypo-osmolality and hyponatremia: Secondary | ICD-10-CM | POA: Diagnosis not present

## 2024-09-17 DIAGNOSIS — J449 Chronic obstructive pulmonary disease, unspecified: Secondary | ICD-10-CM | POA: Diagnosis not present

## 2024-09-17 DIAGNOSIS — I739 Peripheral vascular disease, unspecified: Secondary | ICD-10-CM | POA: Diagnosis not present

## 2024-09-17 DIAGNOSIS — F17210 Nicotine dependence, cigarettes, uncomplicated: Secondary | ICD-10-CM | POA: Diagnosis not present

## 2024-09-21 ENCOUNTER — Other Ambulatory Visit (HOSPITAL_COMMUNITY): Payer: Self-pay

## 2024-09-21 DIAGNOSIS — H25812 Combined forms of age-related cataract, left eye: Secondary | ICD-10-CM | POA: Diagnosis not present

## 2024-09-21 DIAGNOSIS — H5371 Glare sensitivity: Secondary | ICD-10-CM | POA: Diagnosis not present

## 2024-09-23 DIAGNOSIS — R5383 Other fatigue: Secondary | ICD-10-CM | POA: Diagnosis not present

## 2024-09-23 DIAGNOSIS — E7849 Other hyperlipidemia: Secondary | ICD-10-CM | POA: Diagnosis not present

## 2024-09-23 DIAGNOSIS — D559 Anemia due to enzyme disorder, unspecified: Secondary | ICD-10-CM | POA: Diagnosis not present

## 2024-09-23 DIAGNOSIS — I10 Essential (primary) hypertension: Secondary | ICD-10-CM | POA: Diagnosis not present

## 2024-09-23 DIAGNOSIS — Z0001 Encounter for general adult medical examination with abnormal findings: Secondary | ICD-10-CM | POA: Diagnosis not present

## 2024-09-23 DIAGNOSIS — Z1329 Encounter for screening for other suspected endocrine disorder: Secondary | ICD-10-CM | POA: Diagnosis not present

## 2024-09-24 ENCOUNTER — Other Ambulatory Visit: Payer: Self-pay | Admitting: Cardiology

## 2024-09-27 ENCOUNTER — Other Ambulatory Visit: Payer: Self-pay | Admitting: *Deleted

## 2024-09-28 ENCOUNTER — Ambulatory Visit

## 2024-09-28 DIAGNOSIS — C61 Malignant neoplasm of prostate: Secondary | ICD-10-CM

## 2024-09-28 MED ORDER — DEGARELIX ACETATE 80 MG ~~LOC~~ SOLR
80.0000 mg | Freq: Once | SUBCUTANEOUS | Status: AC
  Administered 2024-09-28: 80 mg via SUBCUTANEOUS

## 2024-09-28 NOTE — Progress Notes (Signed)
 Firmagon  Sub Q Injection  Due to Prostate Cancer patient is present today for monthly Firmagon  Injection.  Order received and reviewed and authorization verification reviewed.   Medication: Firmagon  (Degarelix )  Dose: 80 mg Location: right upper abdomen cleaned and prepped with alcohol  prior to injection Lot: [id number] Exp: [id number]  Patient tolerated well, no complications were noted Band aid applied over injection site.   Performed by: Carlos, CMA  Follow up: 1 month for injection. Appointment scheduled with patient.

## 2024-09-29 ENCOUNTER — Encounter: Payer: Self-pay | Admitting: Oncology

## 2024-09-29 DIAGNOSIS — I4891 Unspecified atrial fibrillation: Secondary | ICD-10-CM | POA: Diagnosis not present

## 2024-09-29 DIAGNOSIS — J449 Chronic obstructive pulmonary disease, unspecified: Secondary | ICD-10-CM | POA: Diagnosis not present

## 2024-09-29 DIAGNOSIS — C774 Secondary and unspecified malignant neoplasm of inguinal and lower limb lymph nodes: Secondary | ICD-10-CM | POA: Diagnosis not present

## 2024-09-29 DIAGNOSIS — I10 Essential (primary) hypertension: Secondary | ICD-10-CM | POA: Diagnosis not present

## 2024-09-29 DIAGNOSIS — M353 Polymyalgia rheumatica: Secondary | ICD-10-CM | POA: Diagnosis not present

## 2024-09-29 DIAGNOSIS — E871 Hypo-osmolality and hyponatremia: Secondary | ICD-10-CM | POA: Diagnosis not present

## 2024-09-29 DIAGNOSIS — C61 Malignant neoplasm of prostate: Secondary | ICD-10-CM | POA: Diagnosis not present

## 2024-09-29 DIAGNOSIS — G609 Hereditary and idiopathic neuropathy, unspecified: Secondary | ICD-10-CM | POA: Diagnosis not present

## 2024-09-29 DIAGNOSIS — I739 Peripheral vascular disease, unspecified: Secondary | ICD-10-CM | POA: Diagnosis not present

## 2024-09-29 DIAGNOSIS — Z681 Body mass index (BMI) 19 or less, adult: Secondary | ICD-10-CM | POA: Diagnosis not present

## 2024-09-29 DIAGNOSIS — F17210 Nicotine dependence, cigarettes, uncomplicated: Secondary | ICD-10-CM | POA: Diagnosis not present

## 2024-09-29 MED ORDER — METHYLPHENIDATE HCL 5 MG PO TABS: 5.0000 mg | ORAL_TABLET | Freq: Two times a day (BID) | ORAL | 0 refills | Status: DC | PRN

## 2024-10-06 DIAGNOSIS — R300 Dysuria: Secondary | ICD-10-CM | POA: Diagnosis not present

## 2024-10-06 DIAGNOSIS — R10A1 Flank pain, right side: Secondary | ICD-10-CM | POA: Diagnosis not present

## 2024-10-06 DIAGNOSIS — C61 Malignant neoplasm of prostate: Secondary | ICD-10-CM | POA: Diagnosis not present

## 2024-10-06 DIAGNOSIS — Z681 Body mass index (BMI) 19 or less, adult: Secondary | ICD-10-CM | POA: Diagnosis not present

## 2024-10-06 DIAGNOSIS — C774 Secondary and unspecified malignant neoplasm of inguinal and lower limb lymph nodes: Secondary | ICD-10-CM | POA: Diagnosis not present

## 2024-10-07 ENCOUNTER — Encounter: Payer: Self-pay | Admitting: Oncology

## 2024-10-15 ENCOUNTER — Other Ambulatory Visit: Payer: Self-pay

## 2024-10-19 ENCOUNTER — Other Ambulatory Visit: Payer: Self-pay

## 2024-10-19 ENCOUNTER — Other Ambulatory Visit (HOSPITAL_COMMUNITY): Payer: Self-pay

## 2024-10-19 NOTE — Progress Notes (Signed)
 Specialty Pharmacy Refill Coordination Note  Spoke with Cisar,GINA (Wife)  Dan [name] is a 78 y.o. male contacted today regarding refills of specialty medication(s) Abiraterone  Acetate (ZYTIGA )  Doses on hand: Around 10  Patient requested: Delivery   Delivery date: 10/22/24   Verified address: [address] [place] [address]  Medication will be filled on 10/21/24

## 2024-10-22 ENCOUNTER — Ambulatory Visit

## 2024-10-25 ENCOUNTER — Other Ambulatory Visit (HOSPITAL_COMMUNITY): Payer: Self-pay

## 2024-10-26 ENCOUNTER — Other Ambulatory Visit: Payer: Self-pay

## 2024-11-01 ENCOUNTER — Encounter: Payer: Self-pay | Admitting: Internal Medicine

## 2024-11-01 ENCOUNTER — Ambulatory Visit (INDEPENDENT_AMBULATORY_CARE_PROVIDER_SITE_OTHER)

## 2024-11-01 ENCOUNTER — Encounter: Payer: Self-pay | Admitting: *Deleted

## 2024-11-01 DIAGNOSIS — C61 Malignant neoplasm of prostate: Secondary | ICD-10-CM | POA: Diagnosis not present

## 2024-11-01 MED ORDER — DEGARELIX ACETATE 80 MG ~~LOC~~ SOLR
80.0000 mg | Freq: Once | SUBCUTANEOUS | Status: AC
  Administered 2024-11-01: 80 mg via SUBCUTANEOUS

## 2024-11-01 NOTE — Progress Notes (Signed)
 Firmagon  Sub Q Injection  Due to Prostate Cancer patient is present today for monthly Firmagon  Injection.  Order received and reviewed and authorization verification reviewed.   Medication: Firmagon  (Degarelix )  Dose: 80 mg Location: left upper abdomen cleaned and prepped with alcohol  prior to injection Lot: [id number] Exp: 04/2026  Patient tolerated well, no complications were noted Band aid applied over injection site.   Performed by: Carlos, CMA  Follow up: 1 month for injection. Appointment scheduled with patient.

## 2024-11-02 ENCOUNTER — Encounter: Payer: Self-pay | Admitting: Internal Medicine

## 2024-11-02 ENCOUNTER — Other Ambulatory Visit: Payer: Self-pay | Admitting: Cardiology

## 2024-11-02 ENCOUNTER — Inpatient Hospital Stay

## 2024-11-05 ENCOUNTER — Other Ambulatory Visit: Payer: Self-pay

## 2024-11-09 ENCOUNTER — Encounter: Payer: Self-pay | Admitting: Oncology

## 2024-11-13 ENCOUNTER — Other Ambulatory Visit: Payer: Self-pay | Admitting: Cardiology

## 2024-11-16 ENCOUNTER — Other Ambulatory Visit: Payer: Self-pay | Admitting: Cardiology

## 2024-11-23 ENCOUNTER — Encounter: Payer: Self-pay | Admitting: Oncology

## 2024-11-24 ENCOUNTER — Other Ambulatory Visit: Payer: Self-pay | Admitting: *Deleted

## 2024-11-24 MED ORDER — METHYLPHENIDATE HCL 5 MG PO TABS: 5.0000 mg | ORAL_TABLET | Freq: Two times a day (BID) | ORAL | 0 refills | Status: AC | PRN

## 2024-11-30 ENCOUNTER — Ambulatory Visit

## 2024-12-01 ENCOUNTER — Ambulatory Visit

## 2024-12-02 ENCOUNTER — Inpatient Hospital Stay: Attending: Oncology

## 2024-12-02 ENCOUNTER — Ambulatory Visit: Admitting: Internal Medicine

## 2024-12-02 ENCOUNTER — Inpatient Hospital Stay

## 2024-12-02 DIAGNOSIS — C61 Malignant neoplasm of prostate: Secondary | ICD-10-CM | POA: Insufficient documentation

## 2024-12-02 DIAGNOSIS — F17210 Nicotine dependence, cigarettes, uncomplicated: Secondary | ICD-10-CM | POA: Diagnosis not present

## 2024-12-02 DIAGNOSIS — E611 Iron deficiency: Secondary | ICD-10-CM | POA: Diagnosis not present

## 2024-12-02 DIAGNOSIS — M810 Age-related osteoporosis without current pathological fracture: Secondary | ICD-10-CM

## 2024-12-02 DIAGNOSIS — C772 Secondary and unspecified malignant neoplasm of intra-abdominal lymph nodes: Secondary | ICD-10-CM

## 2024-12-02 DIAGNOSIS — D508 Other iron deficiency anemias: Secondary | ICD-10-CM

## 2024-12-02 DIAGNOSIS — C775 Secondary and unspecified malignant neoplasm of intrapelvic lymph nodes: Secondary | ICD-10-CM | POA: Insufficient documentation

## 2024-12-02 LAB — COMPREHENSIVE METABOLIC PANEL WITH GFR
ALT: 6 U/L (ref 0–44)
AST: 14 U/L — ABNORMAL LOW (ref 15–41)
Albumin: 4.3 g/dL (ref 3.5–5.0)
Alkaline Phosphatase: 80 U/L (ref 38–126)
Anion gap: 14 (ref 5–15)
BUN: 14 mg/dL (ref 8–23)
CO2: 24 mmol/L (ref 22–32)
Calcium: 10.6 mg/dL — ABNORMAL HIGH (ref 8.9–10.3)
Chloride: 96 mmol/L — ABNORMAL LOW (ref 98–111)
Creatinine, Ser: 1.1 mg/dL (ref 0.61–1.24)
GFR, Estimated: >60 mL/min
Glucose, Bld: 115 mg/dL — ABNORMAL HIGH (ref 70–99)
Potassium: 4.5 mmol/L (ref 3.5–5.1)
Sodium: 135 mmol/L (ref 135–145)
Total Bilirubin: 0.3 mg/dL (ref 0.0–1.2)
Total Protein: 6.7 g/dL (ref 6.5–8.1)

## 2024-12-02 LAB — IRON AND TIBC
Iron: 42 ug/dL — ABNORMAL LOW (ref 45–182)
Saturation Ratios: 16 % — ABNORMAL LOW (ref 17.9–39.5)
TIBC: 256 ug/dL (ref 250–450)
UIBC: 214 ug/dL

## 2024-12-02 LAB — CBC WITH DIFFERENTIAL/PLATELET
Abs Immature Granulocytes: 0.05 10*3/uL (ref 0.00–0.07)
Basophils Absolute: 0.1 10*3/uL (ref 0.0–0.1)
Basophils Relative: 1 %
Eosinophils Absolute: 0 10*3/uL (ref 0.0–0.5)
Eosinophils Relative: 0 %
HCT: 34.5 % — ABNORMAL LOW (ref 39.0–52.0)
Hemoglobin: 11.1 g/dL — ABNORMAL LOW (ref 13.0–17.0)
Immature Granulocytes: 1 %
Lymphocytes Relative: 10 %
Lymphs Abs: 0.9 10*3/uL (ref 0.7–4.0)
MCH: 29.1 pg (ref 26.0–34.0)
MCHC: 32.2 g/dL (ref 30.0–36.0)
MCV: 90.3 fL (ref 80.0–100.0)
Monocytes Absolute: 0.5 10*3/uL (ref 0.1–1.0)
Monocytes Relative: 5 %
Neutro Abs: 7.6 10*3/uL (ref 1.7–7.7)
Neutrophils Relative %: 83 %
Platelets: 452 10*3/uL — ABNORMAL HIGH (ref 150–400)
RBC: 3.82 MIL/uL — ABNORMAL LOW (ref 4.22–5.81)
RDW: 15.7 % — ABNORMAL HIGH (ref 11.5–15.5)
WBC: 9.1 10*3/uL (ref 4.0–10.5)
nRBC: 0 % (ref 0.0–0.2)

## 2024-12-02 LAB — PSA: Prostatic Specific Antigen: <0.02 ng/mL (ref 0.00–4.00)

## 2024-12-02 LAB — FERRITIN: Ferritin: 395 ng/mL — ABNORMAL HIGH (ref 24–336)

## 2024-12-06 ENCOUNTER — Ambulatory Visit

## 2024-12-07 ENCOUNTER — Ambulatory Visit: Admitting: Gastroenterology

## 2024-12-08 ENCOUNTER — Ambulatory Visit

## 2024-12-09 ENCOUNTER — Inpatient Hospital Stay

## 2024-12-09 ENCOUNTER — Inpatient Hospital Stay: Attending: Oncology | Admitting: Oncology

## 2024-12-09 VITALS — BP 131/71 | HR 85 | Temp 98.3°F | Resp 19 | Ht 70.0 in | Wt 130.0 lb

## 2024-12-09 DIAGNOSIS — C772 Secondary and unspecified malignant neoplasm of intra-abdominal lymph nodes: Secondary | ICD-10-CM

## 2024-12-09 DIAGNOSIS — D508 Other iron deficiency anemias: Secondary | ICD-10-CM

## 2024-12-09 DIAGNOSIS — R221 Localized swelling, mass and lump, neck: Secondary | ICD-10-CM

## 2024-12-09 DIAGNOSIS — C61 Malignant neoplasm of prostate: Secondary | ICD-10-CM

## 2024-12-09 DIAGNOSIS — M810 Age-related osteoporosis without current pathological fracture: Secondary | ICD-10-CM

## 2024-12-09 NOTE — Patient Instructions (Addendum)
 [facility] at [facility] Discharge Instructions   You were seen and examined today by Dr. Davonna.  She reviewed the results of your lab work which are normal/stable.   Continue Zytiga  and prednisone  as prescribed.   We will see you back in 3 months. We will repeat lab work prior to this visit.    Return as scheduled.    Thank you for choosing [facility] at [facility] to provide your oncology and hematology care.  To afford each patient quality time with our provider, please arrive at least 15 minutes before your scheduled appointment time.   If you have a lab appointment with the Cancer Center please come in thru the Main Entrance and check in at the main information desk.  You need to re-schedule your appointment should you arrive 10 or more minutes late.  We strive to give you quality time with our providers, and arriving late affects you and other patients whose appointments are after yours.  Also, if you no show three or more times for appointments you may be dismissed from the clinic at the providers discretion.     Again, thank you for choosing [facility].  Our hope is that these requests will decrease the amount of time that you wait before being seen by our physicians.       _____________________________________________________________  Should you have questions after your visit to [facility], please contact our office at [phone] and follow the prompts.  Our office hours are 8:00 a.m. and 4:30 p.m. Monday - Friday.  Please note that voicemails left after 4:00 p.m. may not be returned until the following business day.  We are closed weekends and major holidays.  You do have access to a nurse 24-7, just call the main number to the clinic [phone] and do not press any options, hold on the line and a nurse will answer the phone.    For prescription refill requests, have your pharmacy contact our  office and allow 72 hours.    Due to Covid, you will need to wear a mask upon entering the hospital. If you do not have a mask, a mask will be given to you at the Main Entrance upon arrival. For doctor visits, patients may have 1 support person age 44 or older with them. For treatment visits, patients can not have anyone with them due to social distancing guidelines and our immunocompromised population.

## 2024-12-09 NOTE — Progress Notes (Signed)
 Patient is taking Zytiga as prescribed. He has not missed any doses and reports no side effects at this time.

## 2024-12-10 ENCOUNTER — Ambulatory Visit

## 2024-12-10 DIAGNOSIS — C61 Malignant neoplasm of prostate: Secondary | ICD-10-CM

## 2024-12-10 MED ORDER — DEGARELIX ACETATE 80 MG ~~LOC~~ SOLR
80.0000 mg | Freq: Once | SUBCUTANEOUS | Status: AC
  Administered 2024-12-10: 80 mg via SUBCUTANEOUS

## 2024-12-10 NOTE — Progress Notes (Cosign Needed)
 Firmagon  Sub Q Injection  Due to Prostate Cancer patient is present today for monthly Firmagon  Injection.  Order received and reviewed and authorization verification reviewed.   Medication: Firmagon  (Degarelix )  Dose: 80mg  Location: left upper abdomen cleaned and prepped with alcohol  prior to injection Lot: [id number] Exp: 12/2025  Patient tolerated well, no complications were noted Band aid applied over injection site.   Performed by: Carlos, CMA  Follow up: 1 month for injection. Appointment scheduled with patient.

## 2024-12-17 ENCOUNTER — Ambulatory Visit: Admitting: Gastroenterology

## 2025-01-07 ENCOUNTER — Ambulatory Visit

## 2025-03-01 ENCOUNTER — Inpatient Hospital Stay

## 2025-03-08 ENCOUNTER — Inpatient Hospital Stay: Admitting: Oncology
